# Patient Record
Sex: Male | Born: 1940 | Race: White | Hispanic: No | Marital: Married | Smoking: Former smoker
Health system: Southern US, Community
[De-identification: ages and names within clinical notes are randomized; demographics above are authoritative.]

## PROBLEM LIST (undated history)

## (undated) DIAGNOSIS — G4730 Sleep apnea, unspecified: Secondary | ICD-10-CM

## (undated) DIAGNOSIS — J8410 Pulmonary fibrosis, unspecified: Secondary | ICD-10-CM

## (undated) DIAGNOSIS — J441 Chronic obstructive pulmonary disease with (acute) exacerbation: Secondary | ICD-10-CM

## (undated) DIAGNOSIS — I6529 Occlusion and stenosis of unspecified carotid artery: Secondary | ICD-10-CM

## (undated) DIAGNOSIS — K219 Gastro-esophageal reflux disease without esophagitis: Secondary | ICD-10-CM

## (undated) DIAGNOSIS — I444 Left anterior fascicular block: Secondary | ICD-10-CM

## (undated) DIAGNOSIS — E782 Mixed hyperlipidemia: Secondary | ICD-10-CM

## (undated) DIAGNOSIS — J019 Acute sinusitis, unspecified: Secondary | ICD-10-CM

## (undated) DIAGNOSIS — G4700 Insomnia, unspecified: Secondary | ICD-10-CM

## (undated) DIAGNOSIS — T4145XA Adverse effect of unspecified anesthetic, initial encounter: Secondary | ICD-10-CM

## (undated) DIAGNOSIS — J438 Other emphysema: Secondary | ICD-10-CM

## (undated) DIAGNOSIS — M1990 Unspecified osteoarthritis, unspecified site: Secondary | ICD-10-CM

## (undated) DIAGNOSIS — T8859XA Other complications of anesthesia, initial encounter: Secondary | ICD-10-CM

## (undated) DIAGNOSIS — I639 Cerebral infarction, unspecified: Secondary | ICD-10-CM

## (undated) HISTORY — DX: Sleep apnea, unspecified: G47.30

## (undated) HISTORY — PX: EYE SURGERY: SHX253

## (undated) HISTORY — PX: JOINT REPLACEMENT: SHX530

## (undated) HISTORY — DX: Acute sinusitis, unspecified: J01.90

## (undated) HISTORY — DX: Occlusion and stenosis of unspecified carotid artery: I65.29

## (undated) HISTORY — DX: Insomnia, unspecified: G47.00

## (undated) HISTORY — DX: Mixed hyperlipidemia: E78.2

## (undated) HISTORY — DX: Cerebral infarction, unspecified: I63.9

## (undated) HISTORY — PX: OTHER SURGICAL HISTORY: SHX169

## (undated) HISTORY — PX: SPINE SURGERY: SHX786

## (undated) HISTORY — DX: Other emphysema: J43.8

## (undated) HISTORY — DX: Chronic obstructive pulmonary disease with (acute) exacerbation: J44.1

---
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
## 1998-08-26 HISTORY — PX: OTHER SURGICAL HISTORY: SHX169

## 1999-05-15 ENCOUNTER — Ambulatory Visit (HOSPITAL_COMMUNITY): Admission: RE | Admit: 1999-05-15 | Discharge: 1999-05-15 | Payer: Self-pay | Admitting: Neurosurgery

## 1999-05-21 ENCOUNTER — Encounter: Payer: Self-pay | Admitting: Neurosurgery

## 1999-05-21 ENCOUNTER — Ambulatory Visit (HOSPITAL_COMMUNITY): Admission: RE | Admit: 1999-05-21 | Discharge: 1999-05-21 | Payer: Self-pay | Admitting: Neurosurgery

## 1999-07-09 ENCOUNTER — Encounter: Payer: Self-pay | Admitting: Orthopedic Surgery

## 1999-07-09 ENCOUNTER — Encounter: Admission: RE | Admit: 1999-07-09 | Discharge: 1999-07-09 | Payer: Self-pay | Admitting: Orthopedic Surgery

## 1999-08-27 HISTORY — PX: OTHER SURGICAL HISTORY: SHX169

## 2000-02-15 ENCOUNTER — Encounter: Payer: Self-pay | Admitting: Orthopedic Surgery

## 2000-02-21 ENCOUNTER — Encounter: Payer: Self-pay | Admitting: Orthopedic Surgery

## 2000-02-21 ENCOUNTER — Inpatient Hospital Stay (HOSPITAL_COMMUNITY): Admission: RE | Admit: 2000-02-21 | Discharge: 2000-02-27 | Payer: Self-pay | Admitting: Orthopedic Surgery

## 2000-02-25 ENCOUNTER — Encounter: Payer: Self-pay | Admitting: Orthopedic Surgery

## 2003-01-27 ENCOUNTER — Encounter: Admission: RE | Admit: 2003-01-27 | Discharge: 2003-01-27 | Payer: Self-pay | Admitting: Orthopedic Surgery

## 2003-01-27 ENCOUNTER — Encounter: Payer: Self-pay | Admitting: Orthopedic Surgery

## 2003-02-07 ENCOUNTER — Encounter: Payer: Self-pay | Admitting: Radiology

## 2003-02-07 ENCOUNTER — Encounter: Admission: RE | Admit: 2003-02-07 | Discharge: 2003-02-07 | Payer: Self-pay | Admitting: Orthopedic Surgery

## 2003-02-07 ENCOUNTER — Encounter: Payer: Self-pay | Admitting: Orthopedic Surgery

## 2003-02-21 ENCOUNTER — Encounter: Admission: RE | Admit: 2003-02-21 | Discharge: 2003-02-21 | Payer: Self-pay | Admitting: Orthopedic Surgery

## 2003-02-21 ENCOUNTER — Encounter: Payer: Self-pay | Admitting: Orthopedic Surgery

## 2003-03-07 ENCOUNTER — Encounter: Admission: RE | Admit: 2003-03-07 | Discharge: 2003-03-07 | Payer: Self-pay | Admitting: Orthopedic Surgery

## 2003-03-07 ENCOUNTER — Encounter: Payer: Self-pay | Admitting: Orthopedic Surgery

## 2003-08-27 HISTORY — PX: OTHER SURGICAL HISTORY: SHX169

## 2004-02-09 ENCOUNTER — Inpatient Hospital Stay (HOSPITAL_COMMUNITY): Admission: RE | Admit: 2004-02-09 | Discharge: 2004-02-14 | Payer: Self-pay | Admitting: Orthopedic Surgery

## 2004-11-30 ENCOUNTER — Encounter: Admission: RE | Admit: 2004-11-30 | Discharge: 2004-11-30 | Payer: Self-pay | Admitting: Orthopedic Surgery

## 2004-12-13 ENCOUNTER — Encounter: Admission: RE | Admit: 2004-12-13 | Discharge: 2004-12-13 | Payer: Self-pay | Admitting: Orthopedic Surgery

## 2004-12-27 ENCOUNTER — Encounter: Admission: RE | Admit: 2004-12-27 | Discharge: 2004-12-27 | Payer: Self-pay | Admitting: Orthopedic Surgery

## 2005-01-09 ENCOUNTER — Encounter: Admission: RE | Admit: 2005-01-09 | Discharge: 2005-01-09 | Payer: Self-pay | Admitting: Orthopedic Surgery

## 2006-08-07 ENCOUNTER — Encounter (INDEPENDENT_AMBULATORY_CARE_PROVIDER_SITE_OTHER): Payer: Self-pay | Admitting: Specialist

## 2006-08-07 ENCOUNTER — Inpatient Hospital Stay (HOSPITAL_COMMUNITY): Admission: RE | Admit: 2006-08-07 | Discharge: 2006-08-08 | Payer: Self-pay | Admitting: Orthopedic Surgery

## 2007-09-22 ENCOUNTER — Ambulatory Visit: Payer: Self-pay | Admitting: Cardiology

## 2007-09-22 LAB — CONVERTED CEMR LAB
GFR calc Af Amer: 78 mL/min
GFR calc non Af Amer: 64 mL/min
Potassium: 4.1 meq/L (ref 3.5–5.1)
Sodium: 139 meq/L (ref 135–145)

## 2007-10-02 ENCOUNTER — Ambulatory Visit: Payer: Self-pay

## 2007-10-02 ENCOUNTER — Ambulatory Visit: Payer: Self-pay | Admitting: Internal Medicine

## 2007-10-20 ENCOUNTER — Ambulatory Visit: Payer: Self-pay | Admitting: Cardiology

## 2007-10-27 ENCOUNTER — Ambulatory Visit: Payer: Self-pay | Admitting: Cardiology

## 2007-10-27 LAB — CONVERTED CEMR LAB
Albumin: 3.6 g/dL (ref 3.5–5.2)
Alkaline Phosphatase: 64 units/L (ref 39–117)
LDL Cholesterol: 108 mg/dL — ABNORMAL HIGH (ref 0–99)
Total Bilirubin: 1 mg/dL (ref 0.3–1.2)
Total CHOL/HDL Ratio: 3
Triglycerides: 44 mg/dL (ref 0–149)

## 2007-11-27 ENCOUNTER — Encounter: Admission: RE | Admit: 2007-11-27 | Discharge: 2007-11-27 | Payer: Self-pay | Admitting: Orthopedic Surgery

## 2007-12-01 ENCOUNTER — Ambulatory Visit: Payer: Self-pay | Admitting: Pulmonary Disease

## 2007-12-01 DIAGNOSIS — R0602 Shortness of breath: Secondary | ICD-10-CM

## 2007-12-01 DIAGNOSIS — R05 Cough: Secondary | ICD-10-CM

## 2007-12-03 ENCOUNTER — Encounter: Payer: Self-pay | Admitting: Pulmonary Disease

## 2007-12-15 ENCOUNTER — Ambulatory Visit: Payer: Self-pay | Admitting: Pulmonary Disease

## 2007-12-17 ENCOUNTER — Telehealth (INDEPENDENT_AMBULATORY_CARE_PROVIDER_SITE_OTHER): Payer: Self-pay | Admitting: *Deleted

## 2007-12-21 ENCOUNTER — Ambulatory Visit: Payer: Self-pay | Admitting: Pulmonary Disease

## 2007-12-21 DIAGNOSIS — J449 Chronic obstructive pulmonary disease, unspecified: Secondary | ICD-10-CM | POA: Insufficient documentation

## 2007-12-25 ENCOUNTER — Telehealth (INDEPENDENT_AMBULATORY_CARE_PROVIDER_SITE_OTHER): Payer: Self-pay | Admitting: *Deleted

## 2008-01-04 ENCOUNTER — Telehealth (INDEPENDENT_AMBULATORY_CARE_PROVIDER_SITE_OTHER): Payer: Self-pay | Admitting: *Deleted

## 2008-01-08 ENCOUNTER — Ambulatory Visit: Payer: Self-pay | Admitting: Pulmonary Disease

## 2008-01-19 ENCOUNTER — Telehealth: Payer: Self-pay | Admitting: Pulmonary Disease

## 2008-04-18 ENCOUNTER — Ambulatory Visit: Payer: Self-pay | Admitting: Cardiology

## 2008-04-18 LAB — CONVERTED CEMR LAB
BUN: 8 mg/dL (ref 6–23)
Chloride: 108 meq/L (ref 96–112)
GFR calc Af Amer: 78 mL/min
GFR calc non Af Amer: 64 mL/min
Glucose, Bld: 94 mg/dL (ref 70–99)
Potassium: 3.6 meq/L (ref 3.5–5.1)
Sodium: 141 meq/L (ref 135–145)

## 2008-04-20 ENCOUNTER — Ambulatory Visit: Payer: Self-pay | Admitting: Cardiovascular Disease

## 2008-06-27 ENCOUNTER — Ambulatory Visit: Payer: Self-pay | Admitting: Pulmonary Disease

## 2008-07-11 ENCOUNTER — Telehealth (INDEPENDENT_AMBULATORY_CARE_PROVIDER_SITE_OTHER): Payer: Self-pay | Admitting: *Deleted

## 2008-10-27 ENCOUNTER — Encounter: Payer: Self-pay | Admitting: Cardiology

## 2008-10-27 ENCOUNTER — Ambulatory Visit: Payer: Self-pay | Admitting: Cardiology

## 2008-11-01 ENCOUNTER — Ambulatory Visit: Payer: Self-pay | Admitting: Cardiology

## 2008-11-01 LAB — CONVERTED CEMR LAB
ALT: 19 units/L (ref 0–53)
CO2: 30 meq/L (ref 19–32)
Calcium: 8.9 mg/dL (ref 8.4–10.5)
Creatinine, Ser: 1 mg/dL (ref 0.4–1.5)
GFR calc Af Amer: 96 mL/min
Glucose, Bld: 99 mg/dL (ref 70–99)
HDL: 52.5 mg/dL (ref >39.0)
Total CHOL/HDL Ratio: 3.2
Total Protein: 6.6 g/dL (ref 6.0–8.3)
Triglycerides: 59 mg/dL (ref 0–149)
VLDL: 12 mg/dL (ref 0–40)

## 2008-12-28 ENCOUNTER — Ambulatory Visit: Payer: Self-pay | Admitting: Pulmonary Disease

## 2009-01-27 ENCOUNTER — Encounter: Admission: RE | Admit: 2009-01-27 | Discharge: 2009-01-27 | Payer: Self-pay | Admitting: Orthopedic Surgery

## 2009-02-06 ENCOUNTER — Encounter: Payer: Self-pay | Admitting: Pulmonary Disease

## 2009-02-16 ENCOUNTER — Encounter: Admission: RE | Admit: 2009-02-16 | Discharge: 2009-02-16 | Payer: Self-pay | Admitting: Orthopedic Surgery

## 2009-03-30 ENCOUNTER — Ambulatory Visit: Payer: Self-pay | Admitting: Pulmonary Disease

## 2009-04-12 ENCOUNTER — Ambulatory Visit: Payer: Self-pay | Admitting: Pulmonary Disease

## 2009-04-12 DIAGNOSIS — J441 Chronic obstructive pulmonary disease with (acute) exacerbation: Secondary | ICD-10-CM | POA: Insufficient documentation

## 2009-04-12 DIAGNOSIS — J019 Acute sinusitis, unspecified: Secondary | ICD-10-CM | POA: Insufficient documentation

## 2009-07-24 ENCOUNTER — Ambulatory Visit: Payer: Self-pay | Admitting: Urology

## 2009-07-26 ENCOUNTER — Ambulatory Visit: Payer: Self-pay | Admitting: Urology

## 2009-10-03 ENCOUNTER — Encounter: Payer: Self-pay | Admitting: Cardiology

## 2009-10-03 ENCOUNTER — Ambulatory Visit: Payer: Self-pay

## 2009-10-03 DIAGNOSIS — I739 Peripheral vascular disease, unspecified: Secondary | ICD-10-CM

## 2009-10-24 ENCOUNTER — Ambulatory Visit: Payer: Self-pay | Admitting: Cardiology

## 2009-10-24 DIAGNOSIS — E782 Mixed hyperlipidemia: Secondary | ICD-10-CM | POA: Insufficient documentation

## 2009-11-07 ENCOUNTER — Ambulatory Visit: Payer: Self-pay | Admitting: Pulmonary Disease

## 2010-04-27 ENCOUNTER — Encounter: Admission: RE | Admit: 2010-04-27 | Discharge: 2010-04-27 | Payer: Self-pay | Admitting: Orthopedic Surgery

## 2010-05-04 ENCOUNTER — Encounter: Admission: RE | Admit: 2010-05-04 | Discharge: 2010-05-04 | Payer: Self-pay | Admitting: Orthopedic Surgery

## 2010-05-18 ENCOUNTER — Encounter: Admission: RE | Admit: 2010-05-18 | Discharge: 2010-05-18 | Payer: Self-pay | Admitting: Orthopedic Surgery

## 2010-06-15 ENCOUNTER — Encounter: Admission: RE | Admit: 2010-06-15 | Discharge: 2010-06-15 | Payer: Self-pay | Admitting: Orthopedic Surgery

## 2010-07-12 ENCOUNTER — Encounter: Admission: RE | Admit: 2010-07-12 | Discharge: 2010-07-12 | Payer: Self-pay | Admitting: Orthopedic Surgery

## 2010-07-16 ENCOUNTER — Encounter: Admission: RE | Admit: 2010-07-16 | Discharge: 2010-07-16 | Payer: Self-pay | Admitting: Neurosurgery

## 2010-07-26 ENCOUNTER — Ambulatory Visit: Payer: Self-pay | Admitting: Pulmonary Disease

## 2010-08-09 ENCOUNTER — Ambulatory Visit: Payer: Self-pay | Admitting: Pulmonary Disease

## 2010-08-24 ENCOUNTER — Inpatient Hospital Stay (HOSPITAL_COMMUNITY)
Admission: RE | Admit: 2010-08-24 | Discharge: 2010-08-25 | Payer: Self-pay | Source: Home / Self Care | Attending: Neurosurgery | Admitting: Neurosurgery

## 2010-09-20 ENCOUNTER — Other Ambulatory Visit: Payer: Self-pay | Admitting: Neurosurgery

## 2010-09-20 DIAGNOSIS — Z09 Encounter for follow-up examination after completed treatment for conditions other than malignant neoplasm: Secondary | ICD-10-CM

## 2010-09-27 NOTE — Assessment & Plan Note (Signed)
Summary: acute sick visit for cough   Visit Type:  Sick visit Primary Provider/Referring Provider:  [name]  CC:  Pt c/o dry hacking non-productive cough x 1 month and face and head pressure starting Sunday.  History of Present Illness: The pt is a 70y/o male who comes in today for an acute sick visit related to cough.  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  He had been doing very well until approx 3 weeks ago when he began to develop a dry hacking cough, as well as head congestion and pressure.  He had not had any mucus from his nares, and no purulence noted.  He denies increased sob or chest symptoms.  He has had no f/c/s.  Current Medications (verified): 1)  Mirapex 0.5 Mg  Tabs (Pramipexole Dihydrochloride) .... Take 1 Tab By Mouth At Bedtime 2)  Doxazosin Mesylate 2 Mg  Tabs (Doxazosin Mesylate) .... Take 1 Tablet By Mouth Once A Day 3)  Clonazepam 1 Mg  Tabs (Clonazepam) .... Take 1 Tab By Mouth At Bedtime 4)  Pantoprazole Sodium 40 Mg  Tbec (Pantoprazole Sodium) .... Take 1 Tablet By Mouth Am and Pm 5)  Low-Dose Aspirin 81 Mg  Tabs (Aspirin) .... Take 1 Tablet By Mouth Once A Day 6)  Multivitamins   Tabs (Multiple Vitamin) .... Take 1 Tablet By Mouth Once A Day 7)  Ventolin Hfa 108 (90 Base) Mcg/act Aers (Albuterol Sulfate) .... Inhale 2 Puffs Every 6 Hours As Needed Rescue Only 8)  Spiriva Handihaler 18 Mcg  Caps (Tiotropium Bromide Monohydrate) .... Inhale Contents of 1 Capsule Once A Day 9)  Symbicort 160-4.5 Mcg/act  Aero (Budesonide-Formoterol Fumarate) .... Inhale 2 Puffs Two Times A Day 10)  Sertraline Hcl 25 Mg Tabs (Sertraline Hcl) .... Once Daily 11)  Chlorpheniramine Maleate 4 Mg Tabs (Chlorpheniramine Maleate) .... 1 Tab As Needed 12)  Nasal Saline Rinses .... Two Times A Day 13)  Vitamin D 1000 Unit Tabs (Cholecalciferol) .... 1 Tab Once Daily 14)  Coq10 30 Mg Caps (Coenzyme Q10) .... 1 Cap Once Daily 15)   Fish Oil 1000 Mg Caps (Omega-3 Fatty Acids) .... 1 Cap Once Daily 16)  Cinnamon Powder .... 1/4 Tsp Once Daily 17)  Cats Claw 500 Mg Caps (Cats Claw (Uncaria Tomentosa)) .... 2 Cap Once Daily 18)  African Mango 150 Mg .... 2 Caps Once Daily 19)  Tumeric Extract 300mg .... 2 Cap Once Daily 20)  Mucinex Maximum Strength 1200 Mg Xr12h-Tab (Guaifenesin) .... Take 1 Tablet By Mouth Two Times A Day As Needed 21)  Dhea 50 Mg Tabs (Prasterone (Dhea)) .... Take 1 Tablet By Mouth Once A Day 22)  Vitamin C 500 Mg Tabs (Ascorbic Acid) .... Take 1 Tablet By Mouth Two Times A Day  Allergies (verified): 1)  ! Sulfa  Review of Systems       The patient complains of non-productive cough, headaches, and nasal congestion/difficulty breathing through nose.  The patient denies shortness of breath with activity, shortness of breath at rest, productive cough, coughing up blood, chest pain, irregular heartbeats, acid heartburn, indigestion, loss of appetite, weight change, abdominal pain, difficulty swallowing, sore throat, tooth/dental problems, sneezing, itching, ear ache, anxiety, depression, hand/feet swelling, joint stiffness or pain, rash, change in color of mucus, and fever.    Vital Signs:  Patient profile:   70 year old male Height:      70  inches Weight:  199 pounds O2 Sat:      96 % on Room air Temp:     97.9 degrees F oral Pulse rate:   84 / minute BP sitting:   114 / 70  (left arm) Cuff size:   regular  Vitals Entered By: Zackery Barefoot CMA (November 07, 2009 9:26 AM)  O2 Flow:  Room air CC: Pt c/o dry hacking non-productive cough x 1 month, face and head pressure starting Sunday Comments Medications reviewed with patient Verified contact number and pharmacy with patient Zackery Barefoot CMA  November 07, 2009 9:27 AM     Physical Exam  General:  wd male in nad Nose:  narrowing bilat with mild crusting but no purulence Mouth:  clear, no thrush Lungs:  clear to auscultation Heart:   rrr, no mrg Extremities:  no edema or cyanosis Neurologic:  alert and oriented, moves all 4.   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  It is unclear if his current increase in cough is due to this, or if he may have developing sinusitis.  He has had issues with this before, but currently with pressure but no purulence.  Will treat this conservatively with nasal hygiene and behavioral therapy for his cough, but will have low threshold to check xray of sinuses if symptoms continue.  Problem # 2:  EMPHYSEMA (ICD-492.8)  stable on current meds.  Medications Added to Medication List This Visit: 1)  Mucinex Maximum Strength 1200 Mg Xr12h-tab (Guaifenesin) .... Take 1 tablet by mouth two times a day as needed 2)  Dhea 50 Mg Tabs (Prasterone (dhea)) .... Take 1 tablet by mouth once a day 3)  Vitamin C 500 Mg Tabs (Ascorbic acid) .... Take 1 tablet by mouth two times a day  Other Orders: Est. Patient Level IV (16109)  Patient Instructions: 1)  try nasonex 2 sprays each nostril each am, continue saline rinses 2)  minimize voice use (no long conversations, no singing/yelling) 3)  no throat clearing, hard candy during the day to bathe back of throat. 4)  If cough is not improving, please let me know and we can consider an xray of your sinuses. 5)  If doing well, can change nasonex to as needed. 6)  followup with me yearly.   Immunization History:  Influenza Immunization History:    Influenza:  historical (06/26/2009)

## 2010-09-27 NOTE — Assessment & Plan Note (Signed)
Summary: acute sick visit for copd, cyclical coughing.   Visit Type:  Follow-up Primary Provider/Referring Provider:  [name]  CC:  follow up. pt c/o cough with occas white- yellow phlem. Pt states he is having a hard time breathing and dry mouth. pt states when he stands up he starts coughing and gets dizzy and his chest hurts. pt states he quit smoking 1979.Marland Kitchen  History of Present Illness: the pt comes in today for an acute sick visit.  He has known mild emphysema, and also classic cyclical coughing that flares from time to time.  He comes in today where he has had cough with discolored mucus, increased sob, and chest congestion.  He has also had nasal congestion with postnasal drip.  Since having this, he has started with his cyclical cough mechanism, and at times coughs so hard he gets dizzy.    Current Medications (verified): 1)  Mirapex 0.5 Mg  Tabs (Pramipexole Dihydrochloride) .... Take 1 Tab By Mouth At Bedtime 2)  Doxazosin Mesylate 2 Mg  Tabs (Doxazosin Mesylate) .... Take 1 Tablet By Mouth Once A Day 3)  Clonazepam 1 Mg  Tabs (Clonazepam) .... Take 1 Tab By Mouth At Bedtime 4)  Pantoprazole Sodium 40 Mg  Tbec (Pantoprazole Sodium) .... Take 1 Tablet By Mouth Am and Pm 5)  Multivitamins   Tabs (Multiple Vitamin) .... Take 1 Tablet By Mouth Once A Day 6)  Ventolin Hfa 108 (90 Base) Mcg/act Aers (Albuterol Sulfate) .... Inhale 2 Puffs Every 6 Hours As Needed Rescue Only 7)  Spiriva Handihaler 18 Mcg  Caps (Tiotropium Bromide Monohydrate) .... Inhale Contents of 1 Capsule Once A Day 8)  Symbicort 160-4.5 Mcg/act  Aero (Budesonide-Formoterol Fumarate) .... Inhale 2 Puffs Two Times A Day 9)  Sertraline Hcl 25 Mg Tabs (Sertraline Hcl) .... Once Daily 10)  Chlorpheniramine Maleate 4 Mg Tabs (Chlorpheniramine Maleate) .Marland Kitchen.. 1 Tab As Needed 11)  Nasal Saline Rinses .... Two Times A Day 12)  Vitamin D 1000 Unit Tabs (Cholecalciferol) .Marland Kitchen.. 1 Tab Once Daily 13)  Fish Oil 1000 Mg Caps  (Omega-3 Fatty Acids) .Marland Kitchen.. 1 Cap Once Daily 14)  Mucinex Maximum Strength 1200 Mg Xr12h-Tab (Guaifenesin) .... Take 1 Tablet By Mouth Two Times A Day As Needed 15)  Vitamin C 500 Mg Tabs (Ascorbic Acid) .... Take 1 Tablet By Mouth Two Times A Day 16)  Delsym 30 Mg/66ml Lqcr (Dextromethorphan Polistirex) .... Per Bottle  Allergies (verified): 1)  ! Sulfa  Past History:  Past medical, surgical, family and social histories (including risk factors) reviewed, and no changes noted (except as noted below).  Past Medical History: Reviewed history from 10/19/2009 and no changes required. 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 s/p spine surgery in 2000 s/p appendectomy Had a right ankle reconstruction in 1986.  Had a ruptured disk in 2000.  Left knee replacement in 2001.  Right knee replacement 2005.      Family History: Reviewed history from 12/01/2007 and no changes required. Mother - emphysema Maternal mother and father - heart disease  Social History: Reviewed history from 12/01/2007 and no changes required. 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  The patient denies non-productive cough, coughing up blood, irregular heartbeats, acid heartburn, indigestion, weight change, abdominal pain, difficulty swallowing, sore throat, tooth/dental problems, sneezing, itching, ear ache, depression, hand/feet swelling, joint stiffness or pain, rash, and fever.     Vital Signs:  Patient profile:   70 year old male Height:      70 inches Weight:      194 pounds BMI:     27.94 O2 Sat:      96 % on Room air Temp:     97.5 degrees F oral Pulse rate:   99 / minute BP sitting:   98 / 70  (left arm) Cuff size:   regular  Vitals Entered By: Carver Fila (July 26, 2010 9:42 AM)  O2 Flow:  Room air CC: follow up. pt c/o cough with occas white- yellow phlem. Pt states he is having a hard time breathing, dry mouth. pt states when he stands up he starts coughing and gets dizzy and his chest hurts. pt states he quit smoking 1979. Comments meds and allergies updated Phone number updated Carver Fila  July 26, 2010 9:43 AM    Physical Exam  General:  ow male in nad Nose:  no purulence or drainage noted. Mouth:  clear, no exudates Lungs:  mild decrease in bs, but no wheezing or rhonchi Heart:  rrr, no mrg Extremities:  no edema or cyanosis Neurologic:  alert and oriented, moves all 4.   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  His history is suggestive of a mild copd exacerbation, and will treat with a short course of prednisone to see if he improves.  This will also help his upper airway irritation as well.  Problem # 2:  COUGH (ICD-786.2) the pt has known issues with cyclical coughing.  He was doing well until recent episode of sinobronchitis, and his cough has escalated again.  At this point, would like to treat him with the modified cyclical cough protocol  Medications Added to Medication List This Visit: 1)  Delsym 30 Mg/76ml Lqcr (Dextromethorphan polistirex) .... Per bottle 2)  Tussicaps 10-8 Mg Xr12h-cap (Hydrocod polst-chlorphen polst) .... One by mouth every 12 hours if needed for cough 3)  Prednisone 10 Mg Tabs (Prednisone) .... Take 4 each day for 2 days,  then 3 each day for 2 days, then 2 each day for 2 days, then 1 each day for 2 days, then stop  Other Orders: Est. Patient Level IV (04540)  Patient Instructions: 1)  will treat with a course of prednisone to help with airway inflammation 2)  will treat with tussicaps for cough suppression to help break the cycle. 3)  work on voice rest, no throat clearing, hard candy to bathe the back of your throat. 4)  no change in pulmonary meds. 5)  If you get back to your usual baseline, followup with me in one year.  If not, you need to let us know.  Prescriptions: PREDNISONE 10 MG  TABS (PREDNISONE) take 4 each day for 2 days, then 3 each day for 2 days, then 2 each day for 2 days, then 1 each day for 2 days, then stop  #20 x 0   Entered and Authorized by:  Barbaraann Share MD   Signed by:   Barbaraann Share MD on 07/26/2010   Method used:   Print then Give to Patient   RxID:   [id number] TUSSICAPS 10-8 MG XR12H-CAP (HYDROCOD POLST-CHLORPHEN POLST) one by mouth every 12 hours if needed for cough  #12 x 0   Entered and Authorized by:   Barbaraann Share MD   Signed by:   Barbaraann Share MD on 07/26/2010   Method used:   Print then Give to Patient   RxID:   [id number]    Immunization History:  Influenza Immunization History:    Influenza:  historical (04/26/2010)  Pneumovax Immunization History:    Pneumovax:  historical (05/26/2008)

## 2010-09-27 NOTE — Assessment & Plan Note (Signed)
Summary: YEARLY/SL   Visit Type:  1 yr f/u Primary Provider:  [name]  CC:  sob pt has emphysema....no other cardiac complaints today.  History of Present Illness: Mr Czajka returns today for evaluation and management carotid artery disease and hyperlipidemia.  We evaluated in the past for coronary artery disease which was negative a stress nuclear study in February 2009. He does have chronic dyspnea which is related to his lung disease.  His total cholesterol last she was 169, triglycerides 59, HDL 52.5, LDL 105, total cholesterol ratio ratio 3.2. I've asked him to get blood work on his next visit with Dr. Zachery Dauer.  Carotid Dopplers were rechecked on October 03, 2009 which showed nonobstructive disease with antegrade flow in both vertebrals.  Current Medications (verified): 1)  Mirapex 0.5 Mg  Tabs (Pramipexole Dihydrochloride) .... Take 1 Tab By Mouth At Bedtime 2)  Doxazosin Mesylate 2 Mg  Tabs (Doxazosin Mesylate) .... Take 1 Tablet By Mouth Once A Day 3)  Clonazepam 1 Mg  Tabs (Clonazepam) .... Take 1 Tab By Mouth At Bedtime 4)  Pantoprazole Sodium 40 Mg  Tbec (Pantoprazole Sodium) .... Take 1 Tablet By Mouth Am and Pm 5)  Low-Dose Aspirin 81 Mg  Tabs (Aspirin) .... Take 1 Tablet By Mouth Once A Day 6)  Multivitamins   Tabs (Multiple Vitamin) .... Take 1 Tablet By Mouth Once A Day 7)  Ventolin Hfa 108 (90 Base) Mcg/act Aers (Albuterol Sulfate) .... Inhale 2 Puffs Every 6 Hours As Needed Rescue Only 8)  Spiriva Handihaler 18 Mcg  Caps (Tiotropium Bromide Monohydrate) .... Inhale Contents of 1 Capsule Once A Day 9)  Symbicort 160-4.5 Mcg/act  Aero (Budesonide-Formoterol Fumarate) .... Inhale 2 Puffs Two Times A Day 10)  Sertraline Hcl 25 Mg Tabs (Sertraline Hcl) .... Once Daily 11)  Chlorpheniramine Maleate 4 Mg Tabs (Chlorpheniramine Maleate) .Marland Kitchen.. 1 Tab As Needed 12)  Nasal Saline Rinses .... Two Times A Day 13)  Vitamin D 1000 Unit Tabs (Cholecalciferol) .Marland Kitchen.. 1 Tab Once  Daily 14)  Coq10 30 Mg Caps (Coenzyme Q10) .Marland Kitchen.. 1 Cap Once Daily 15)  Fish Oil 1000 Mg Caps (Omega-3 Fatty Acids) .Marland Kitchen.. 1 Cap Once Daily 16)  Cinnamon Powder .... 1/4 Tsp Once Daily 17)  Cats Claw 500 Mg Caps (Cats Claw (Uncaria Tomentosa)) .... 2 Cap Once Daily 18)  African Mango 150 Mg .... 2 Caps Once Daily 19)  Tumeric Extract 300mg  .... 2 Cap Once Daily  Allergies: 1)  ! 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  Had a ruptured disk in 2000.  Left knee replacement in 2001.  Right knee replacement 2005.      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 Head:  normocephalic and atraumatic Eyes:  wears glasses Neck:  Neck supple, no JVD. No masses, thyromegaly or abnormal cervical nodes. Chest Nikkita Adeyemi:  no deformities or breast masses noted Lungs:  decreased breath sounds throughout Heart:  PMI nondisplaced, soft S1-S2, no gallop or rub. Abdomen:  Bowel  sounds positive; abdomen soft and non-tender without masses, organomegaly, or hernias noted. No hepatosplenomegaly. Msk:  Back normal, normal gait. Muscle strength and tone normal. Pulses:  pulses normal in all 4 extremities Extremities:  No clubbing or cyanosis. Neurologic:  Alert and oriented x 3. Skin:  Intact without lesions or rashes. Psych:  Normal affect.   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  Impression & Recommendations:  Problem # 1:  CAROTID ARTERY DISEASE (ICD-433.10) Assessment Unchanged I have reviewed his recent carotid Dopplers. No changes in treatment. Repeat study and 2 years. His updated medication list for this problem includes:    Low-dose Aspirin 81 Mg Tabs (Aspirin) .Marland Kitchen... Take 1 tablet by mouth once a day  His updated medication list for this problem includes:    Low-dose Aspirin 81 Mg Tabs (Aspirin) .Marland Kitchen... Take 1 tablet by mouth once a day  Problem # 2:  DYSPNEA (ICD-786.05) Assessment: Unchanged  His updated medication list for this problem includes:    Low-dose Aspirin 81 Mg Tabs (Aspirin) .Marland Kitchen... Take 1 tablet by mouth once a day  His updated medication list for this problem includes:    Low-dose Aspirin 81 Mg Tabs (Aspirin) .Marland Kitchen... 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 Zachery Dauer checked at on his next visit. No changes made in his medications. Labs from last year reviewed.  Patient Instructions: 1)  Your physician recommends that you schedule a follow-up appointment in: YEAR WITH DR Mystique Bjelland 2)  Your physician recommends that you continue on your current medications as directed. Please refer to  the Current Medication list given to you today. 3)  LABS AT PMD OFFICE BMET LIPID LIVER

## 2010-09-27 NOTE — Miscellaneous (Signed)
Summary: Orders Update  Clinical Lists Changes  Problems: Added new problem of CAROTID ARTERY DISEASE (ICD-433.10) Orders: Added new Test order of Carotid Duplex (Carotid Duplex) - Signed 

## 2010-09-27 NOTE — Assessment & Plan Note (Signed)
Summary: acute sick visit for cough and congestion   Primary Shirley Decamp/Referring Adyen Bifulco:  Jeffrey Cobb  CC:  pt c/o cough w/ yellow to green phlem, congestion, increased sob, and body aches from coughing. Pt states he quit smoking 1979. pt states he was fine 2-3 days after finishing the pred and cough syrup but then it came back. .  History of Present Illness: the pt comes in today for an acute sick visit.  He has known mild emphysema, but more importantly a h/o UA dysfunction with cyclical coughing from time to time.  He comes in today with a 2-3 day h/o worsening cough, chest congestion, and purulent appearing mucus.  He had a recent copd flare earlier in the month due to sinobronchitis, and was treated with abx and later a course of steroids.  He did well on the meds, but his cough has come back similar in the past when he had cyclical coughing.    Current Medications (verified): 1)  Mirapex 0.5 Mg  Tabs (Pramipexole Dihydrochloride) .... Take 1 Tab By Mouth At Bedtime 2)  Doxazosin Mesylate 2 Mg  Tabs (Doxazosin Mesylate) .... Take 1 Tablet By Mouth Once A Day 3)  Clonazepam 1 Mg  Tabs (Clonazepam) .... Take 1 Tab By Mouth At Bedtime 4)  Pantoprazole Sodium 40 Mg  Tbec (Pantoprazole Sodium) .... Take 1 Tablet By Mouth Am and Pm 5)  Multivitamins   Tabs (Multiple Vitamin) .... Take 1 Tablet By Mouth Once A Day 6)  Ventolin Hfa 108 (90 Base) Mcg/act Aers (Albuterol Sulfate) .... Inhale 2 Puffs Every 6 Hours As Needed Rescue Only 7)  Spiriva Handihaler 18 Mcg  Caps (Tiotropium Bromide Monohydrate) .... Inhale Contents of 1 Capsule Once A Day 8)  Symbicort 160-4.5 Mcg/act  Aero (Budesonide-Formoterol Fumarate) .... Inhale 2 Puffs Two Times A Day 9)  Sertraline Hcl 25 Mg Tabs (Sertraline Hcl) .... Once Daily 10)  Chlorpheniramine Maleate 4 Mg Tabs (Chlorpheniramine Maleate) .Marland Kitchen.. 1 Tab As Needed 11)  Nasal Saline Rinses .... Two Times A Day  Allergies (verified): 1)  ! Sulfa  Past  History:  Past medical, surgical, family and social histories (including risk factors) reviewed, and no changes noted (except as noted below).  Past Medical History: Reviewed history from 10/19/2009 and no changes required. 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 s/p spine surgery in 2000 s/p appendectomy Had a right ankle reconstruction in 1986.  Had a ruptured disk in 2000.  Left knee replacement in 2001.  Right knee replacement 2005.      Family History: Reviewed history from 12/01/2007 and no changes required. Mother - emphysema Maternal mother and father - heart disease  Social History: Reviewed history from 12/01/2007 and no changes required. 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  The patient denies shortness of breath at rest, non-productive cough, coughing up blood, chest pain, irregular heartbeats, acid heartburn, indigestion, weight change, abdominal pain, difficulty swallowing, tooth/dental problems, headaches, sneezing, itching, ear ache, anxiety, depression, hand/feet swelling, rash, and fever.    Vital Signs:  Patient profile:   69 year  old male Height:      70 inches Weight:      196 pounds BMI:     28.22 O2 Sat:      96 % on Room air Temp:     98.2 degrees F oral Pulse rate:   90 / minute BP sitting:   102 / 72  (left arm) Cuff size:   regular  Vitals Entered By: Carver Fila (August 09, 2010 9:49 AM)  O2 Flow:  Room air CC: pt c/o cough w/  yellow to green phlem, congestion, increased sob, body aches from coughing. Pt states he quit smoking 1979. pt states he was fine 2-3 days after finishing the pred and cough syrup but then it came back.  Comments meds and allergies updated Phone number updated Carver Fila  August 09, 2010 9:49 AM    Physical Exam  General:  ow male in nad Nose:  mild purulence noted in nasal passages, but patent Mouth:  clear, no exudates. Lungs:  totally clear to auscultation Heart:  rrr Extremities:  no edema or cyanosis  Neurologic:  alert and oriented, moves all 4.   Impression & Recommendations:  Problem # 1:  COUGH (ICD-786.2) the pt is having issues with cyclical coughing again after an episode of sinobronchitis.  It is unclear whether he is infected at this point or not, but he is convinced he is.  Will need to treat with the cyclical cough protocol again, and will emperically treat with abx as well.  Will check cxr for completeness, but if he has persistent symptoms he may need ct sinuses.  Medications Added to Medication List This Visit: 1)  Cefdinir 300 Mg Caps (Cefdinir) .... 2 each day for 5 days 2)  Tussicaps 10-8 Mg Xr12h-cap (Hydrocod polst-chlorphen polst) .... One every 12hrs if needed. 3)  Tessalon Perles 100 Mg Caps (Benzonatate) .... 2 up to every 6 hrs if needed.  Other Orders: Est. Patient Level IV (01093) T-2 View CXR (71020TC)  Patient Instructions: 1)  will check a cxr today for completeness. 2)  omnicef 300mg  2 each day for 5 days 3)  cyclical cough protocol....please see instruction sheet. 4)  call if not improving  Prescriptions: TESSALON PERLES 100 MG  CAPS (BENZONATATE) 2 up to every 6 hrs if needed.  #30 x 1   Entered and Authorized by:   Barbaraann Share MD   Signed by:   Barbaraann Share MD on 08/09/2010   Method used:   Print then Give to Patient   RxID:   [id number] TUSSICAPS 10-8 MG XR12H-CAP (HYDROCOD POLST-CHLORPHEN POLST) one every 12hrs if  needed.  #12 x 0   Entered and Authorized by:   Barbaraann Share MD   Signed by:   Barbaraann Share MD on 08/09/2010   Method used:   Print then Give to Patient   RxID:   [id number] CEFDINIR 300 MG CAPS (CEFDINIR) 2 each day for 5 days  #10 x 0   Entered and Authorized by:   Barbaraann Share MD   Signed by:   Barbaraann Share MD on 08/09/2010   Method used:   Print then Give to Patient   RxID:   [id number]    Immunization History:  Influenza Immunization History:    Influenza:  historical (05/26/2010)

## 2010-10-03 ENCOUNTER — Ambulatory Visit
Admission: RE | Admit: 2010-10-03 | Discharge: 2010-10-03 | Disposition: A | Discharge status: Home / Self Care | Payer: Medicare Other | Source: Ambulatory Visit | Attending: Neurosurgery | Admitting: Neurosurgery

## 2010-10-03 DIAGNOSIS — Z09 Encounter for follow-up examination after completed treatment for conditions other than malignant neoplasm: Secondary | ICD-10-CM

## 2010-10-14 NOTE — Op Note (Signed)
NAMESHUN, PLETZ NO.:  [id number]  MEDICAL RECORD NO.:  [id number]          PATIENT TYPE:  INP  LOCATION:  3023                         FACILITY:  [facility]  PHYSICIAN:  Reinaldo Meeker, M.D. DATE OF BIRTH:  01-19-1941  DATE OF PROCEDURE:  08/24/2010 DATE OF DISCHARGE:                              OPERATIVE REPORT   PREOPERATIVE DIAGNOSIS:  Spondylolisthesis, degenerative disk disease, and stenosis L3-4, L4-5.  POSTOPERATIVE DIAGNOSIS:  Spondylolisthesis, degenerative disk disease, and stenosis L3-4, L4-5.  PROCEDURE:  Anterolateral fusion L3-4 and L4-5 via right retroperitoneal approach with PEEK interbody spacer followed by left L3-4 and L4-5 segmental instrumentation with Zimmer pedicle screw system.  SURGEON:  Reinaldo Meeker, MD  ASSISTANT:  Kathaleen Maser. Pool, MD  PROCEDURE IN DETAIL:  After induction of general anesthetic, the patient was placed in the right side up lateral decubitus position.  AP and lateral fluoroscopy was used in standard fashion to put the patient in a good position.  We then made a linear incision over the disk space at L3- 4, carried it down up to the deep fascia.  Then made a more posterior incision which allowed [place] access into the retroperitoneum.  We then turned our finger upward and then passed the first dilator through the more flank incision and safely in the retroperitoneum.  We then went down to the L3-4 disk space and did intraoperative EMG studies which showed no evidence of nerve problem, eventually sequential dilation and eventually placed the retractor.  We tested once more, saw no evidence of nerve issues, and secured the retractor with the Shim device into the interspace.  We then coagulated any residual muscle fibers and incised the disk with a 15 blade.  Using a variety of instruments, we were able to clean the disk space out.  The disk was markedly collapsed.  We initially passed a 6- x 16-mm distractor which  did give [place] some working room.  We then distracted up to the 8-mm size which we felt was a good fit.  We then irrigated once more, released the annulus on the opposite side and impacted by an 8- x 22- x 50-mm cage without difficulty.  This was followed in good position under fluoroscopy.  We then irrigated copiously, confirmed our positioning.  We irrigated once more, stopped any bleeding with bipolar coagulation, and removed the retractor without any difficulty.  We then made a linear incision of the L4-5 interspace. We carried this down to the deep fascia.  We then entered the same retroperitoneal opening that we had made for the L3-4 level, turned our finger upward and allowed access into the retroperitoneal space at L4-5. We once again docked our first dilator on the L4-5 disk and did EMG testing, showed no problem of the nerve, though only superficially.  We then did sequential dilations and still found the same superficial nerve and we got down to the disk space.  We then placed a retractor and secured to the table in standard fashion.  Once we got the retractor into a good position, we tested once more and then locked it down with the  Shim.  We then incised the disk space.  Once again using a variety of instruments, we cleaned out the disk space, distracted up with the 16- mm x 6-mm trial.  We released the edges of the opposite side once again with the Cobb periosteal elevator.  At this time, we distracted the disk space up once more to the 8-mm size and felt that it was a good choice. We then chosen an 8- x 22- x 50-mm cage once again, filled it up with Actifuse and Osteocel Plus, packed without difficulty after irrigating once more and confirming stasis.  Followed into good position.  We then irrigated the incision once more, removed the retractor, and saw that both grafts were in good position.  We then irrigated once more, closed all the incisions with multiple layers of Vicryl  on the fascia, subcutaneous, subcu tissues, and Dermabond on the skin.  We placed sterile dressing and turned the patient to the prone position.  We then prepped the back in usual standard fashion.  Used AP fluoroscopy to identify entry points into the three pedicles.  We passed Jamshidi needle from lateral to medial direction, followed in AP and lateral fluoroscopy and in good position.  We then passed guidewire through and removed the Jamshidi needle at each level.  Starting at L5, we did sequential dilation of the soft tissue and tapped with the 6.0-mm tap. We placed a 6.5- x 40-mm screw at L4-L5 and a 6.5- x 45-mm screw at L3. These were followed in good position in AP and lateral fluoroscopy.  We did connect the 3 small incisions that we made.  We passed a measuring device and chose a 55-mm rod.  We passed the rod down the towers and then we were able to secure it to the top of the screws without difficulty.  We then did a torque and counter-torque without difficulty. Final fluoroscopy in the AP and lateral direction showed good placement of the interbody spacers as well as the screws and rod.  Large amounts of irrigation were carried out in this incision.  Any bleeding controlled with unipolar coagulation.  Wound was then closed with Vicryl on the muscle, fascia, subcutaneous and subcuticular tissues, and staples on the skin.  A sterile dressing was then applied.  The patient was extubated, taken to recovery room in stable condition.          ______________________________ Reinaldo Meeker, M.D.     ROK/MEDQ  D:  08/24/2010  T:  08/25/2010  Job:  [id number]  Electronically Signed by Aliene Beams M.D. on 10/14/2010 10:47:03 PM

## 2010-10-30 ENCOUNTER — Institutional Professional Consult (permissible substitution) (INDEPENDENT_AMBULATORY_CARE_PROVIDER_SITE_OTHER): Payer: Medicare Other | Admitting: Pulmonary Disease

## 2010-10-30 ENCOUNTER — Encounter: Payer: Self-pay | Admitting: Pulmonary Disease

## 2010-10-30 DIAGNOSIS — G4700 Insomnia, unspecified: Secondary | ICD-10-CM

## 2010-11-05 LAB — BASIC METABOLIC PANEL
Calcium: 9.4 mg/dL (ref 8.4–10.5)
GFR calc Af Amer: >60 mL/min (ref >60)
GFR calc non Af Amer: >60 mL/min (ref >60)
Potassium: 3.8 mEq/L (ref 3.5–5.1)
Sodium: 138 mEq/L (ref 135–145)

## 2010-11-05 LAB — CBC
MCH: 30.3 pg (ref 26.0–34.0)
MCHC: 33.1 g/dL (ref 30.0–36.0)
RDW: 13.7 % (ref 11.5–15.5)

## 2010-11-05 LAB — SURGICAL PCR SCREEN
MRSA, PCR: NEGATIVE
Staphylococcus aureus: POSITIVE — AB

## 2010-11-08 DIAGNOSIS — G4700 Insomnia, unspecified: Secondary | ICD-10-CM | POA: Insufficient documentation

## 2010-11-13 NOTE — Assessment & Plan Note (Signed)
Summary: consult for chronic insomnia   Copy to:  [name] Primary Provider/Referring Provider:  [name]  CC:  Sleep Consult.  Pt c/o difficulty falling and staying asleep since 2001. Marland Kitchen  History of Present Illness: The pt is a 70y/o male who I have been asked to see for insomnia.  He has had difficulty sleeping since 2001 after having knee surgery, and has been an ongoing problem since.  He has been tried on various meds over the years, none of which helped him very much.  He has had a sleep study in 2008 which showed no sleep apnea or movement disorder.  He goes to bed around 11pm, and denies watching tv or reading while in bed.  He may go to sleep quickly with subsequent awakenings, or may not be able to initiate sleep.  He typically will leave the bedroom after , and will read or watch tv in family room.  He tells me 95% of time he will never get to sleep for the rest of the night, and if he does it is extremely fragmented with awakenings.  He does get on the computer some during the middle of the night.  The pt feels anxiety is a big issue for him, and states his "mind is racing" all the time when trying to sleep.  He typically will drink 1-2 cups of coffee around 7am, one caffeinated soda during the day, and sometimes tea with dinner.  He denies formal napping during the day, but sometimes will fall asleep in chair in the afternoon.    Current Medications (verified): 1)  Mirapex 0.5 Mg  Tabs (Pramipexole Dihydrochloride) .... Take 1 Tab By Mouth At Bedtime 2)  Doxazosin Mesylate 2 Mg  Tabs (Doxazosin Mesylate) .... Take 1 Tablet By Mouth Once A Day 3)  Clonazepam 1 Mg  Tabs (Clonazepam) .... Take 1 Tab By Mouth At Bedtime 4)  Pantoprazole Sodium 40 Mg  Tbec (Pantoprazole Sodium) .... Take 1 Tablet By Mouth Am and Pm 5)  Ventolin Hfa 108 (90 Base) Mcg/act Aers (Albuterol Sulfate) .... Inhale 2 Puffs Every 6 Hours As Needed Rescue Only 6)  Spiriva Handihaler 18 Mcg  Caps  (Tiotropium Bromide Monohydrate) .... Inhale Contents of 1 Capsule Once A Day 7)  Symbicort 160-4.5 Mcg/act  Aero (Budesonide-Formoterol Fumarate) .... Inhale 2 Puffs Two Times A Day 8)  Sertraline Hcl 25 Mg Tabs (Sertraline Hcl) .... Once Daily 9)  Chlorpheniramine Maleate 4 Mg Tabs (Chlorpheniramine Maleate) .Marland Kitchen.. 1 Tab As Needed 10)  Nasal Saline Rinses .... Two Times A Day 11)  Tessalon Perles 100 Mg  Caps (Benzonatate) .... 2 Up To Every 6 Hrs If Needed.  Allergies (verified): 1)  ! Sulfa  Past History:  Past Medical History: CAROTID ARTERY DISEASE (ICD-433.10) EMPHYSEMA     Past Surgical History: s/p spine surgery in 2000 and 2011 s/p appendectomy  1956 Had a right ankle reconstruction in 1986.  Had a ruptured disk in 2000.  Left knee replacement in 2001.  Right knee replacement 2005.      Family History: Reviewed history from 12/01/2007 and no changes required. Mother - emphysema Maternal mother and father - heart disease allergies: father 2 sisters, 1 brother, son daughter  Social History: Reviewed history from 12/01/2007 and no changes required. 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  quit 1979.  Review of Systems       The patient complains of  non-productive cough, loss of appetite, weight change, and anxiety.  The patient denies shortness of breath with activity, shortness of breath at rest, productive cough, coughing up blood, chest pain, irregular heartbeats, acid heartburn, indigestion, abdominal pain, difficulty swallowing, sore throat, tooth/dental problems, headaches, nasal congestion/difficulty breathing through nose, sneezing, itching, ear ache, depression, hand/feet swelling, joint stiffness or pain, rash, change in color of mucus, and fever.    Vital Signs:  Patient profile:   70 year old male Height:      70 inches Weight:      199.25 pounds BMI:     28.69 O2 Sat:      96 % on Room  air Temp:     98.2 degrees F oral Pulse rate:   93 / minute BP sitting:   104 / 76  (left arm) Cuff size:   regular  Vitals Entered By: Arman Filter LPN (October 29, 5364 10:29 AM)  O2 Flow:  Room air CC: Sleep Consult.  Pt c/o difficulty falling and staying asleep since 2001.  Comments Medications reviewed with patient Arman Filter LPN  October 29, 4401 10:29 AM    Physical Exam  General:  wd male in nad  Eyes:  PERRLA and EOMI.   Nose:  patent without discharge, no purulence noted.  Mouth:  clear, no exudates or lesions seen  Neck:  no jvd, tmg, LN Lungs:  decreased bs, but no wheezing or rhonchi  Heart:  rrr, no mrg  Abdomen:  soft and nontender, bs+ Extremities:  no edema or cyanosis  pulses intact distally  Neurologic:  alert, oriented, moves all 4    Impression & Recommendations:  Problem # 1:  PERSISTENT DISORDER INITIATING/MAINTAINING SLEEP (ICD-307.42)  the pt is describing classic psychophysiologic insomnia, but I also think he may have a large component of sleep state misperception given how little sleep he feels he is getting.  This is a very difficult problem that is deep seated, and typically responds very poorly to medications.  The best treatment is behavioral, and he is already doing some of the things that I would recommend as part of "stimulus control".  I have reviewed sleep restriction therapy with him that he can try, but I ultimately think he will need cognitive behavioral therapy with a psychologist or psychiatrist in order for this to improve (he tells me he is already seeing a behavioral therapist).  I am willing to give him a short term treatment with trazodone as a trial, but again, behavioral therapy is the mainstay of treatment.  I have also reviewed with him again what constitutes good sleep hygiene, but overall he is not doing too bad.  Medications Added to Medication List This Visit: 1)  Trazodone Hcl 50 Mg Tabs (Trazodone hcl) .... At  bedtime  Other Orders: Consultation Level V [phone])  Patient Instructions: 1)  stop klonopine, and try trazodone 50mg  2 each night near bedtime 2)  can try sleep restriction therapy as we discussed 3)  no napping during the day under any circumstance 4)  need to get with a behavioral therapist who will do cognitive behavioral therapy.Marland KitchenMarland KitchenDr Zachery Dauer may have an alternative if you are not happy with your current therapist 5)  please call me in 3 weeks with how things are going.   Prescriptions: TRAZODONE HCL 50 MG  TABS (TRAZODONE HCL) At bedtime  #60 x 0   Entered and Authorized by:   Barbaraann Share MD   Signed by:  Barbaraann Share MD on 10/30/2010   Method used:   Print then Give to Patient   RxID:   [id number]

## 2010-11-14 ENCOUNTER — Other Ambulatory Visit: Payer: Self-pay | Admitting: Neurosurgery

## 2010-11-14 ENCOUNTER — Ambulatory Visit
Admission: RE | Admit: 2010-11-14 | Discharge: 2010-11-14 | Disposition: A | Discharge status: Home / Self Care | Payer: Medicare Other | Source: Ambulatory Visit | Attending: Neurosurgery | Admitting: Neurosurgery

## 2010-11-14 DIAGNOSIS — M48061 Spinal stenosis, lumbar region without neurogenic claudication: Secondary | ICD-10-CM

## 2010-11-14 DIAGNOSIS — M533 Sacrococcygeal disorders, not elsewhere classified: Secondary | ICD-10-CM

## 2010-11-14 DIAGNOSIS — M5137 Other intervertebral disc degeneration, lumbosacral region: Secondary | ICD-10-CM

## 2010-11-15 ENCOUNTER — Telehealth: Payer: Self-pay | Admitting: Pulmonary Disease

## 2010-11-15 MED ORDER — HYDROCOD POLST-CHLORPHEN POLST 10-8 MG/5ML PO LQCR: 5.0000 mL | Freq: Two times a day (BID) | ORAL | Status: DC

## 2010-11-15 NOTE — Telephone Encounter (Signed)
Pt was last seen by [facility] on 10/30/2010 and was started on Trazodone to help with sleep.  Pt states he was doing fine on med and states his sleep had improved. But started to notice a rash on his body that started this past Saturday.  Pt states the rash has gotten worse and now covers his entire body (arms,legs, stomach, back, face and head) Pt states he stopped taking the Trazodone as of yesterday.  Pt denies any swelling or sob.  He states the rash is pink in color and itches. States his head and face itch the worse.  Pt also states the Trazodone has affected his prostate.  Pt states he has an enlarged prostate but was not having difficulty urinating but states now he has an interrupted stream.    Also, pt states he has developed a non productive cough-esp worse during the daytime.  Pt states he had some Tussionex in the past and requests a refill on this.    KC out of office this PM.  Will forward message to doc of the day to address.    Allergies: Sulfa

## 2010-11-15 NOTE — Telephone Encounter (Signed)
Ok tussionex 200 ml, no refill  1 teaspoon twice daily if needed.  Tell him the antihistamine in this will also help his rash, but might possibly aggravate the prostate symptoms (probably not as badly as trazodone).  Add trazodone to med allergy list/ rash.

## 2010-11-15 NOTE — Telephone Encounter (Signed)
Called and spoke with patient-aware of Tussionex Rx called to pharmacy ([facility] [address]) and updated allergy list.

## 2010-11-19 ENCOUNTER — Telehealth: Payer: Self-pay | Admitting: Pulmonary Disease

## 2010-11-19 ENCOUNTER — Encounter: Payer: Self-pay | Admitting: Pulmonary Disease

## 2010-11-19 NOTE — Telephone Encounter (Signed)
Spoke with pt and he states the trazadone caused him to have a rash so he stopped taking it last Thursday. Pt is requesting something different be called into [facility] [address]. Please advise Dr. Shelle Iron thanks  Allergies  Allergen Reactions  . Sulfonamide Derivatives   . Trazodone And Nefazodone Rash    Carver Fila, CMA

## 2010-11-19 NOTE — Telephone Encounter (Signed)
Spoke with pt and notified of the above recs per [facility].  Pt verbalized understanding.

## 2010-11-19 NOTE — Telephone Encounter (Signed)
As we discussed at great length at his last visit, he needs behavioral therapy much more than he needs medications.  Since the trazodone was not tolerated, would not recommend any other med.  I think he needs to get with his current therapist for cognitive behavioral therapy, and to have Dr. Zachery Dauer to send to a different therapist if his current one does not do CBT.

## 2011-01-08 NOTE — Assessment & Plan Note (Signed)
[facility]                            CARDIOLOGY OFFICE NOTE   Jeffrey Cobb, Jeffrey Cobb                     MRN:          [id number]  DATE:10/20/2007                            DOB:          1940/11/11    [name] returns today discuss the findings of his studies.  He had  an abnormal chest x-ray with suggestion of thoracic aortic aneurysm.  Chest CT showed a normal sized thoracic aorta with mild atherosclerotic  calcifications in the arch.  There is no aneurysm or resection.  There  was no pulmonary embolus as well.  He did have a 4.5 mm right middle  lobe nodule.  Suggested noncontrast CT in 6 months was recommended.  We  will arrange for this I have discussed this with the patient.   Carotid Doppler showed nonobstructive plaque with antegrade flow on the  right and retrograde on the left.  There was no evidence of steal  however.  He is not symptomatic from this.  His stress Myoview showed an  exercise for 7 minutes with an EF 58% with no ischemia.   1. I had a long talk with him and his wife today.  I shared the good      news.  I have suggested fasting lipids and a comprehensive      metabolic panel and strong consideration of a statin to get his LDL      to 60 to 70.  He agrees with this plan.  He will return for blood      work.  2. He also needs a repeat CT scan in 6 months.  I have discussed this      with him and his wife.  3. I will plan on seeing him back on annual basis.  Will need to      follow up for any symptomatic left vertebral steal are subclavian      steal.     Jesse Sans. Daleen Squibb, MD, [facility]  Electronically Signed    TCW/MedQ  DD: 10/20/2007  DT: 10/20/2007  Job #: [id number]   cc:   Kerry Kass, M.D. [facility]

## 2011-01-08 NOTE — Assessment & Plan Note (Signed)
[facility]                            CARDIOLOGY OFFICE NOTE   Jeffrey Cobb, Jeffrey Cobb                     MRN:          [id number]  DATE:10/27/2008                            DOB:          01-16-1941    Jeffrey Cobb comes in today for followup.   He has lost 28 pounds and looks remarkably good.  His blood pressure is  down to 124/79 and his heart rates in the 80s.   He is having no symptoms of angina or ischemia.  He is currently on  aspirin 81 mg a day, but no other true cardiac meds.   He is due lipids, but has eaten this morning.  We decided not to treat  him with a statin because of a high HDL and a total cholesterol HDL  ratio of 3.   PHYSICAL EXAMINATION:  VITAL SIGNS:  His blood pressure today is 124/79,  his pulse is 80 and regular, and his weight is 193.  HEENT:  Normal.  NECK:  Supple.  Carotid upstrokes are equal bilaterally without bruits,  no JVD.  Thyroid is not enlarged.  Trachea is midline.  LUNGS:  Clear to  auscultation and percussion.  HEART:  A regular rate and rhythm.  No gallop.  ABDOMEN:  Soft, good bowel sounds.  No midline bruit.  No pulsatile  mass.  EXTREMITIES:  No cyanosis, clubbing, or edema.  Pulses are intact.  NEURO:  Intact.   His electrocardiogram is essentially normal.   Repeat CT for pulmonary nodules was obtained on April 20, 2008.  This  showed no change.  At this point, he is totally asymptomatic.  I do not  recommend or repeat CT.   PLAN:  1. Fasting lipids and a comprehensive metabolic panel.  2. See me back in a year.  3. If he develops any pulmonary symptoms which I reviewed in detail or      cardiac symptoms, he will come back sooner.      Thomas C. Daleen Squibb, MD, [facility]  Electronically Signed    TCW/MedQ  DD: 10/27/2008  DT: 10/27/2008  Job #: [id number]

## 2011-01-08 NOTE — Assessment & Plan Note (Signed)
[facility]                            CARDIOLOGY OFFICE NOTE   JESSEE, MEZERA                     MRN:          [id number]  DATE:09/22/2007                            DOB:          1941/07/14    I was asked by Dr. Stevphen Rochester to consult on Jeffrey Cobb for an  enlarged aorta.  This was seen on a chest x-ray couple weeks ago for  upper respiratory infection/allergy.   HISTORY OF PRESENT ILLNESS:  [name] is a delightful 70 year old  gentleman who comes with his wife today for the above problem.   Back in early December he developed a dry cough.  He went to Stevphen Rochester who has seen him in the past for allergies.  This is not cleared  with coughing or with steroids.  A chest x-ray was taken a couple weeks  ago, which I do not have a copy of.  This showed apparently an enlarged  aorta.   His cardiac risk factors include age, sex, and mild to moderate carotid  disease by a [employer] screen March 21, 2006.  He has no symptoms of TIAs  or stroke history.  He denies any history of hypertension and his  cholesterol always has been less than 200.  He quit smoking in the 80s  and does not have diabetes and no premature coronary disease history.   He has had a little bit of tightness in his chest with this dry cough.  He gets a little short of breath with exertion.  He had no true angina  and he has had no symptoms of ripping or tearing in the back.   CURRENT MEDICATIONS:  1. Mirapex 0.5 mg nightly.  2. Doxazosin 2 mg b.i.d.  3. Celebrex 200 mg one p.r.n.  4. Clonazepam 1 mg nightly.  5. Pantoprazole 40 mg daily.  6. Vitamin D 400 units a day.  7. Aspirin 81 mg a day.  8. Sertraline 100 mg one daily.  9. Multivitamin.   HE IS INTOLERANT OF SULFA.   He quit smoking in 83.  He does not drink.   SURGICAL HISTORY:  1. Had a right ankle reconstruction in 1986.  2. Had a ruptured disk in 2000.  3. Left knee replacement in 2001.  4. Right knee replacement 2005.   FAMILY HISTORY:  Is negative for premature coronary disease.   SOCIAL HISTORY:  He is retired as of 2004.  Married, has two children.   REVIEW OF SYSTEMS:  Other than allergies and hay fever, has a history of  an ulcer, stomach and bowel disorder, gastroesophageal reflux, sexual  dysfunction, arthritis, anxiety, depression.   PHYSICAL EXAMINATION:  He is a very pleasant gentleman in no acute  distress.  He is 5 feet, 10 inches. Weighs 210 pounds.  His blood  pressure 157/90 today, which is unusual for him usually it is 120/80.  His heart rate is 72.  EKG is essentially normal except for left anterior fascicular block.  HEENT:  Normocephalic, atraumatic.  PERRLA.  Extraocular movements are  intact.  Sclerae clear.  Face symmetry  is normal.  Carotid upstrokes are  equal bilaterally without bruits.  NECK: Is supple.  Thyroid is not enlarged.  There is no pulsatile mass  in suprasternal notch. He has no axillary or subclavicular bruits.  LUNGS:  Clear to auscultation and percussion.  There is no posterior bruit or to and fro murmur.  HEART:  Reveals a nondisplaced PMI.  Normal S1-S2.  There is no aortic  insufficiency.  S2 splits physiologically.  ABDOMEN: Exam is soft, good bowel sounds.  No obvious organomegaly.  There is no midline bruit.  There is no tenderness.  EXTREMITIES:  No cyanosis, clubbing or edema.  Pulses were brisk, both  dorsalis pedis and posterior tibial. NEURO:  Exam is intact.  SKIN:  Unremarkable.   ASSESSMENT:  1. Abnormal chest x-ray suggesting an aortic aneurysm or aortic root      enlargement.  2. Question hypertension.  This may just be anxiety from this visit.  3. Several cardiac risk factors with dyspnea on exertion.   PLAN:  1. CT with contrast to rule out an ascending aortic or thoracic aortic      aneurysm.  2. Exercise rest/stress Myoview after we clear him from the aneurysm.  3. Carotid Dopplers for history of  carotid disease.     Thomas C. Daleen Squibb, MD, [facility]  Electronically Signed    TCW/MedQ  DD: 09/22/2007  DT: 09/22/2007  Job #: [id number]   cc:   Kerry Kass, M.D. [facility]

## 2011-01-11 ENCOUNTER — Encounter: Payer: Self-pay | Admitting: Cardiology

## 2011-01-11 NOTE — Procedures (Signed)
[place]  Patient:    Jeffrey Cobb, Jeffrey Cobb                     MRN: [id number] Proc. Date: 02/21/00 Adm. Date:  [id number] Attending:  Colbert Ewing CC:         Loreta Ave, M.D.                           Procedure Report  DATE OF BIRTH:  September 20, 1930  PROCEDURE:  SURGEON:  Judie Petit, M.D.  INDICATIONS:  This is a 70 year old male with a history of hypertension, scheduled for total knee replacement by Loreta Ave, M.D. today.  Risks preoperatively were discussed in detail with the patient, particularly of bleeding, infection, nerve damage, worsening pain, spinal with headache, numbness, etc.  The patient agreed to the procedure and knowing that we would do it after the knee replacement in the operating room still under general anesthesia.  DESCRIPTION OF PROCEDURE:  After an uneventful total knee replacement by Dr. Eulah Pont, the patient was placed in the right lateral decubitus position.  A 17-gauge Tuohy needle was used with loss of resistance technique at the L2-3 interspace.  Unsuccessful initial attempt at L3-4.  Preservative-free normal saline was used with loss of resistance technique.  There was no heme or CSF aspiration.  Test dose was negative _____ cc of 1.5% Xylocaine with 1:200,000 epinephrine.  The catheter was subsequently placed at 3 cm without difficulty.  The needle was subsequently withdrawn without difficulty.  The catheter was subsequently secured on the patients back, and the patient was turned to the supine position, and awakened from general anesthesia without difficulty.  The patient will be placed on 1/16% bupivacaine with fentanyl 5 mcg/cc.  The patient will be followed by the anesthesia team. DD:  02/21/00 TD:  02/23/00 Job: [id number]

## 2011-01-11 NOTE — Op Note (Signed)
NAME:  Jeffrey Cobb, Jeffrey Cobb NO.:  [id number]   MEDICAL RECORD NO.:  [id number]                   PATIENT TYPE:  INP   LOCATION:  2550                                 FACILITY:  [facility]   PHYSICIAN:  Loreta Ave, M.D.              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   OPERATIVE PROCEDURE:  Right total knee replacement with soft tissue  balancing.  Cemented number 9 Osteonix femoral component, posterior  stabilized.  Cemented number 9 tibial component with 10 mm posterior  stabilized flex polyethylene insert.  Cemented recessed 28 mm patellar  component.   SURGEON:  Loreta Ave, M.D.   ASSISTANT:  Arlys John D. Petrarca, P.A.-C.   ANESTHESIA:  General.   ESTIMATED BLOOD LOSS:  Minimal.   TOURNIQUET TIME:  1 hour 20 minutes.   SPECIMENS:  Excised bone and soft tissue.   CULTURES:  None.   COMPLICATIONS:  None.   DRESSINGS:  Soft compressive with knee immobilizer.   PROCEDURE:  The patient was brought to the operating room and after adequate  anesthesia had been obtained, tourniquet applied to the upper aspect of the  right leg.  Prepped and draped in the usual sterile fashion.  Exsanguination  with elevation of Esmarch and tourniquet inflated to 350 mmHg.  The knee  examined.  5 degrees varus correctable to neutral.  5 degree flexion  contracture.  Flexion to 120 degrees.  A straight incision above the patella  down to the tibial tubercle.  Medial parapatellar arthrotomy.  Hemostasis  with cautery.  A large prepatellar bursa resected.  The knee exposed.  Grade  4 changes throughout.  Remnants of the menisci, cruciate ligaments, loose  bodies, and spurs all  removed.  Distal femur exposed.  Intramedullary guide  placed.  Distal cut removing 10 mm set at 5 degrees of valgus.  Sized for  number 9 component.  Jigs put in place restoring normal external rotation.  Definitive cuts made.  Trial put in place and found to fit well.  Trial  removed.  The tibia exposed.  The tibial spine removed with a saw.  Intramedullary guide placed.  Proximal cut 5 degree posterior slope  resecting 5 mm off the deficient medial side.  Sized for a number 9  component.  The patella was sized, reamed, and drilled for a 28 mm  component.  The trials were put in place.  A large Baker's cyst  posteromedially was drained and a window made within this to allow further  drainage.  With trials in place, number 9 on the femur, number 9 on the  tibia, and a 10 mm insert  as well as a 28 mm component of the patella.  I  had a nicely balanced knee with full extension, full flexion, no lift off of  flexion, and good collateral stability.  Good patellofemoral tracking.  The  tibia was marked for appropriate rotation and reamed.  All trials were  removed.  The knee was copiously irrigated with the pulse irrigating device.  Cement prepared and placed on all components which were firmly seated and  excess cement removed.  Polyethylene attached attached to the tibia.  The  knee reduced.  Once the cement hardened, the knee was re-examined.  I was  very pleased with alignment, stability, motion, and patellofemoral tracking.  A Hemovac was placed and brought out through a separate stab wound.  Arthrotomy closed with #1 Vicryl, skin and subcutaneous tissue with Vicryl  and staples.  Margins of the wound and knee injected with Marcaine.  Sterile  compressive dressing applied.  Tourniquet deflated and removed.  Anesthesia  reversed.  Brought to the recovery room.  Tolerated the surgery well without  complications.                                               Loreta Ave, M.D.     DFM/MEDQ  D:  02/09/2004  T:  02/09/2004  Job:  [id number]

## 2011-01-11 NOTE — Op Note (Signed)
[place]  Patient:    Jeffrey Cobb, Jeffrey Cobb                     MRN: [id number] Proc. Date: 02/21/00 Adm. Date:  [id number] Attending:  Colbert Ewing                           Operative Report  PREOPERATIVE DIAGNOSIS:  End-stage degenerative arthritis, bone-on-bone, left knee.  POSTOPERATIVE DIAGNOSIS:  End-stage degenerative arthritis, bone-on-bone, left knee with varus alignment, mild flexion contracture and lateral patellar tracking.  OPERATION PERFORMED:  Left total knee replacement utilizing Osteonics prosthesis.  Cruciate retaining type.  Press-Fit #9 femoral component. Cemented #9 tibial component with 10 mm polyethylene insert.  Cemented recessed 28 mm nonmetal backed patellar component.  Appropriate soft tissue balancing with medial release and also lateral retinacular release.  SURGEON:  Loreta Ave, M.D.  ASSISTANT:  Arlys John D. Petrarca, P.A.-C.  ANESTHESIA:  General.  ESTIMATED BLOOD LOSS:  Minimal.  TOURNIQUET TIME:  One hour 15 minutes.  SPECIMENS:  Bone and soft tissue.  CULTURES:  None.  COMPLICATIONS:  None.  DRESSING:  Soft compressive with knee immobilizer.  DESCRIPTION OF PROCEDURE:  The patient was brought to the operating room and after adequate anesthesia had been obtained, left knee examined.  5 degree flexion contracture, further flexion 110.  Alignment in varus but with stable ligaments.  Marked crepitus.  Lateral tracking, tethering patellofemoral joint.  Tourniquet applied.  Prepped and draped in the usual sterile fashion. Exsanguinated with elevation and Esmarch.  Tourniquet inflated to 350 mmHg. Incision above the patella down to the tibial tubercle.  Hemostasis obtained with electrocautery.  Medial parapatellar arthrotomy.  Knee exposed.  Remnants of menisci.  Anterior cruciate ligament which had marked replacement with fatty tissue, all removed.  Associated periarticular spurs all  debrided. Posterior cruciate ligament retained as it was in good condition.  Appropriate release of the medial structures to obtain a balanced knee.  Distal femur exposed.  Intramedullary guide placed.  A 10 mm cut set at 5 degrees of valgus.  Sized for a #9 component.  Jigs put in place.  Definitive cuts made. Trial put in place and found to fit well.  Trial removed.  Tibia exposed with appropriate retractors.  Tibial spine removed with a saw.  The intramedullary guide placed.  4 mm removed with a 5 degree posterior slope cut with the intramedullary guide.  Measuring from the medial side.  All jigs removed. Trials put in placed, marked for rotation.  With the 10 mm implant with a 39 above and below I had full extension, full flexion, good stability and alignment.  The tibia was then hand reamed after marking it for rotation. Patella was exposed, sized reamed and drilled for a 28 mm component.  All trials put in place, knee examined.  Sufficient release medially for a balanced knee set at 5 degrees of valgus.  The patellofemoral joint had lateral tracking and tethering, necessitating lateral release.  Once complete, good tracking.  All trials removed.  Copious irrigation with a pulse irrigating device.  Cement prepared, placed on tibial and patellar components which were well seated.  Excessive cement removed.  Polyethylene attached to the tibia.  Femoral component hammered in place and the knee reduced.  Once cement had hardened, the knee was re-examined.  Full extension, full flexion, good patellofemoral tracking.  The wound irrigated again.  Hemovacs were  placed and brought out through separate stab wounds.  Arthrotomy closed with #1 Vicryl.  Skin and subcutaneous tissues with Vicryl and staples.  Margins of the wound and knee injected with Marcaine.  Sterile compressive dressing applied.  Tourniquet inflated and removed.  Knee immobilizer applied. Anesthesia was then to proceed with the  placement of an epidural catheter for postoperative analgesia.  Following this, anesthesia reversed.  Brought to recovery room.  Tolerated surgery well.  No complications. DD:  02/21/00 TD:  02/22/00 Job: [id number]

## 2011-01-11 NOTE — Discharge Summary (Signed)
NAME:  Jeffrey Cobb, APPLEGATE NO.:  [id number]   MEDICAL RECORD NO.:  [id number]                   PATIENT TYPE:  INP   LOCATION:  5005                                 FACILITY:  [facility]   PHYSICIAN:  Loreta Ave, M.D.              DATE OF BIRTH:  25-Nov-1940   DATE OF ADMISSION:  02/09/2004  DATE OF DISCHARGE:  02/14/2004                                 DISCHARGE SUMMARY   ADMISSION DIAGNOSIS:  Advanced degenerative joint disease of the right knee.   DISCHARGE DIAGNOSES:  1. Advanced degenerative joint disease of the right knee.  2. Hematuria.  3. Hypertension.  4. Hypercalcemia.   PROCEDURE:  Right total knee replacement.   HISTORY:  A 70 year old white male status post left total knee replacement  with good results.  He is now having significant pain on activity as well as  rest pain.  Recent injection lasted only lasted one week.  He has  radiographic end-stage DJD.  He is now indicated for a right total knee  replacement.   HOSPITAL COURSE:  A 70 year old white male admitted February 09, 2004 after  appropriate laboratory studies were obtained as well as 1 gram Ancef IV on  call to the operating room.  He was taken to the operating room where he  underwent a right total knee replacement.  A Foley was placed  intraoperatively.  He was continued postoperatively on Ancef 1 gram IV q.8  hours times three doses.  Heparin 5000 units subcu q.12 hours until his  Coumadin became therapeutic.  Continue with his routine admit meds.  CPM  from 0 to 30 degrees for 8 to 10 hours per day increasing by 10 degrees.  PT, OT consults were made.  Ambulation weightbearing as tolerated.  Dilaudid  PCA pump was used for postop pain management as well as a femoral nerve  block.  He was allowed out of bed to the chair the following day.  His PCA  was discontinued and he was placed on Percocet.  He did have some  hypercalcemia and some hyponatremia and this was corrected with  fluid  restriction and oral calcium.  He did have some temperature elevation and he  had blood cultures ordered if his temp was greater than 100 degrees.  He did  have a workup for his temperature on February 13, 2004, which included that of a  CBC and diff, liver functions, clean-catch urine.  He became hypokalemic and  20 meq of KCl was given b.i.d. for 24 hours.  Chest x-ray was also ordered.  He really did not have any areas of infection, so his Keflex which had been  started on February 12, 2004 was discontinued.  PCA was drawn because of blood  in his urine.  A urology consult was made for his urologist to see him.  He  felt that there was nothing acute and that he would follow  him back up in  the office.  He had an unremarkable hospital course otherwise and was  discharged on February 14, 2004 to return back to our office in 10 to 14 days  for recheck evaluation.  EKG showed normal sinus rhythm with a left axis  deviation, nonspecific T wave abnormality which is new from May 15, 1999.  Radiographic studies -- right knee reveals good position and  alignment following right total knee replacement.  Chest x-ray February 13, 2004  reveals no evidence of pulmonary infiltrates, edema, or pleural effusions.  No significant atelectasis is present postoperatively.  Heart size is stable  and within normal limits.  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  No active disease.   LABORATORY STUDIES:  Admitted with a hemoglobin of 14.9, hematocrit 42.6  percent, white count 7600, platelets 169,000.  His platelet count had  dropped to 109,000, but at the time of his discharge is back to normal.  Discharge hemoglobin 8.7, hematocrit 24.9 percent, white count 6200,  platelets 150,000.  Retic count was 1.7, absolute retic was 47.1.  Red cell  count was 2.77.  Pro time at time of discharge was 17.5 with an INR of 1.7.  Chemistries preoperatively -- sodium 140, potassium 3.8, chloride 107,  CO2  26, glucose 91, BUN 10, creatinine 1.3, calcium 9.3, total protein 6.9,  albumin 4.0, AST 23, ALT 20, Alp70.  Total bilirubin 0.7.  Discharge sodium  137, potassium 3.8, chloride 105, CO2 27, glucose 106, BUN 11, creatinine  1.1, calcium 8.5.  His potassium dropped to 3.3 during his hospital course  at its lowest.  Calcium at the lowest was 7.7.  PSA was 0.08.  Urinalysis  February 07, 2004 was benign.  Urinalysis February 13, 2004 revealed moderate  hemoglobin, 3 to 6 red cells, no bacteria, and 0 to 2 white cells.  Blood  type was O positive, antibody screen negative.  He did have a urinalysis  February 14, 2004 again showing moderate hemoglobin, red cells 7 to 10, no white  cells, and no bacteria.   DISCHARGE INSTRUCTIONS:  Given a prescription for Percocet 5/325 1 to 2 tabs  every 4 hours as needed for pain.  Coumadin 5 mg as directed by Genevieve Norlander  pharmacist.  Lovenox 30 mg injected every 12 hours until stopped by [place].  Do not take aspirin, ibuprofen, or naproxen-containing medications.  Colace  100 mg b.i.d.  Iron sulfate 325 mg b.i.d.  Diet as tolerated.  Activity as  taught in PT.  Follow the blue instruction sheet.  Call for increased pain,  swelling, drainage, or temperature over 101 degrees.  Keep his wound clean  and dry and cover with a dressing daily.  May shower with Saran wrap over  the incision.  Call the office to make an appointment to be seen two weeks  from date of surgery.  Discharged in improved condition.      Jeffrey Cobb, P.A.-C.                Loreta Ave, M.D.    BDP/MEDQ  D:  03/16/2004  T:  03/17/2004  Job:  [id number]

## 2011-01-11 NOTE — Discharge Summary (Signed)
[place]  Patient:    Jeffrey Cobb, Jeffrey Cobb                     MRN: [id number] Adm. Date:  [id number] Disc. Date: [id number] Attending:  Colbert Ewing Dictator:   Oris Drone. Petrarca, P.A.-C.                           Discharge Summary  ADMISSION DIAGNOSIS:  Advanced degenerative joint disease left knee.  DISCHARGE DIAGNOSIS:  Advanced degenerative joint disease left knee.  PROCEDURE:  Left total knee replacement.  HISTORY OF PRESENT ILLNESS:  This patient has had longstanding left knee pain with advanced DJD.  He has failed conservative treatment including that of Synvisc injections.  He is now having problems with his activities of daily living.  He is now indicated for left total knee replacement.  HOSPITAL COURSE:  A 70 year old male admitted February 21, 2000, and after appropriate laboratory studies were obtained as well as 1 g Ancef IV on call to the operating room, he was taken to the operating room where he underwent a left total knee replacement. He tolerated the procedure well.  He was placed with an epidural catheter for postoperative pain control.  Coumadin and heparin were begun for antithrombotic prophylaxis.  Consultations with PT, OT, and rehab were instituted.  CPM 0 to 30 degrees incremented by 10 degrees daily.  Ambulation weightbearing as tolerated on the left.  He had an unremarkable postoperative course.  His epidural was pulled on February 23, 2000, and he was placed on Percocet one to two q.4h. p.r.n. pain.  He did well with his physical therapy.  On February 25, 2000, he did have elevation of his temperature.  At that time CBC, chest x-ray, and blood cultures x 2 with a urine culture were obtained.  His temperature improved.  He had a guaiac performed on February 27, 2000, which was negative and he was discharged on that day in improved condition to return back to the office in seven to 10 days for staple removal.  RADIOGRAPHIC  STUDIES:  February 21, 2000, showed satisfactory appearance following total knee replacement.  Chest x-ray of February 25, 2000, showed lungs are clear, heart mediastinal structures are normal.  LABORATORY STUDIES:  February 15, 2000, showed white count of 6.1, hemoglobin 15.7, hematocrit 46.7%, white count 159,000.  February 27, 2000, shows white count of 8400, hemoglobin 9.0, hematocrit 25.8%, platelet count was 199,000.  Occult blood of February 27, 2000, was negative.  Chemistries on February 15, 2000, reveals sodium 139, potassium 4.5, chloride 106, CO2 27, glucose 100, BUN 14, creatinine 1.2, calcium 9.5, total protein 6.8, albumin 4.0, AST 22, ALT 27, ALP 59, total bilirubin 1.0.  February 24, 2000, showed sodium 138, potassium 3.8, chloride 103, CO2 28, glucose 110, BUN 11, creatinine 1.2, calcium 8.3. Urinalysis of February 21, 2000, showed 0 to 5 whites, 0 to 5 reds, rare bacteria. February 25, 2000, showed 30 protein, no white cells, 11 red cells, few bacteria. He was blood type O positive, antibody screen negative.  Blood cultures x 2 showed no growth.  Urine culture of February 25, 2000, revealed greater than 100,000 colonies, multiple species and recollection was suggested.  DISCHARGE MEDICATIONS: 1. He was given a prescription for Percocet one or two tablets q.4h. p.r.n.    pain. 2. Coumadin 5 mg take as directed by [facility]. 3. Iron 325  mg one tablet daily. 4. Continue home medications as before.  DISCHARGE INSTRUCTIONS:  Home health RN to draw protimes.  May weightbear as tolerated with his walker.  Continue with his home exercise program.  Keep wound clean and dry and may shower.  Call if increased temperature greater than 101 degrees.  FOLLOW-UP:  He will make an appointment to be seen in the office in seven to 10 days for follow-up.  DISCHARGE CONDITION:  Improved. DD:  04/14/00 TD:  04/15/00 Job: [id number]

## 2011-01-11 NOTE — Op Note (Signed)
Jeffrey Cobb, [place] NO.:  [id number]   MEDICAL RECORD NO.:  [id number]          PATIENT TYPE:  INP   LOCATION:  5011                         FACILITY:  [facility]   PHYSICIAN:  Loreta Ave, M.D. 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   PROCEDURES PERFORMED:  Exploration of right shoulder with resection of  large subacromial bursa.  Total shoulder replacement, Stryker  prosthesis.  Cemented pegged #7 polyethylene glenoid component.  Press-  Fit humeral component with a #14-mm stem and a 45 x 18-mm head with a 4-  mm offset.   ANESTHESIA:  General.   SURGEON:  Loreta Ave, M.D.   ASSISTANT:  Zonia Kief, P.A.   SPECIMENS:  None.   CULTURES:  None.   COMPLICATIONS:  None.   DRESSINGS:  Sterile compressive with shoulder immobilizer.   ESTIMATED BLOOD LOSS:  150 mL.   BLOOD REPLACED:  None.   COMPLICATIONS:  None.   DESCRIPTION OF PROCEDURE:  The patient was brought to the operating room  and placed on the operating table in supine position.  After adequate  anesthesia had been obtained, the right arm was examined.  About 80% of  normal motion, surprising given his degree of arthritis.  Placed in a  beach-chair position and the shoulder was then sterilely prepped and  draped in the usual sterile fashion.  Zonia Kief assisted throughout  the entire procedure with me.   An incision from the coracoid along the deltopectoral interval.  Skin  and subcutaneous tissue was divided.  The deltopectoral interval  developed and retractors were put in place.  Conjoined tendon was  exposed and retracted.  The front of the shoulder was  then exposed, and  there was a large cystic mass.  I was not sure what this was at the  beginning, but it turned out to be a large subacromial bursal swelling.  This had almost 10-20 mL of fluid markedly thickened.  Able to excise  this in its entirety.  The top of the rotator cuff some attrition, but  intact.  Subscap was taken down for anatomic repair later.  The shoulder  exposed.  Humeral head was cut at 30 degrees of retroversion and then  used to measure for the replacement head.  The glenoid exposed.  There  were so many spurs all around this that were partially loose, partially  detached, and some firm, that it increased the size of the glenoid to  almost twice fold.  Spurs were sequentially removed, getting this back  to the normal-sized glenoid.  It was then reamed and drilled and sized  for a #7 component, with the top of the component right at the bottom of  the biceps which was still intact.  Copious irrigation.  Cement  prepared.  The pegged component firmly cemented, excessive  cement  removed.  Good capturing and fixation and alignment.  Attention was  turned to the humerus.  Handheld reaming up to a #14 component.  After  appropriate trials, the #14 component was firmly seated at 30 degrees of  retroversion.  The 45 x 18 head was attached with the offset so that I  would get symmetric coverage of the entire proximal humerus.  At  completion, a solid, stable fixation of the humeral component and head.  Shoulder reduced with excellent motion.  Wound irrigated.  Subscap  repaired anatomically with FiberWire.  Retractor removed.  The wound  closed with Vicryl and then staples.  Margin of wound injected with  Marcaine.  A sterile compressive dressing applied.  Sling applied.  The  anesthesia reversed.  Brought to recovery room.  Tolerated surgery well  with no complications.      Loreta Ave, M.D.  Electronically Signed     DFM/MEDQ  D:  08/07/2006  T:  08/08/2006   Job:  [id number]

## 2011-01-16 ENCOUNTER — Encounter: Payer: Self-pay | Admitting: Cardiology

## 2011-01-16 ENCOUNTER — Ambulatory Visit (INDEPENDENT_AMBULATORY_CARE_PROVIDER_SITE_OTHER): Payer: Medicare Other | Admitting: Cardiology

## 2011-01-16 VITALS — BP 122/80 | HR 69 | Resp 17 | Ht 70.0 in | Wt 201.0 lb

## 2011-01-16 DIAGNOSIS — I6529 Occlusion and stenosis of unspecified carotid artery: Secondary | ICD-10-CM

## 2011-01-16 DIAGNOSIS — E782 Mixed hyperlipidemia: Secondary | ICD-10-CM

## 2011-01-16 DIAGNOSIS — I2510 Atherosclerotic heart disease of native coronary artery without angina pectoris: Secondary | ICD-10-CM

## 2011-01-16 MED ORDER — ASPIRIN EC 81 MG PO TBEC: 81.0000 mg | DELAYED_RELEASE_TABLET | Freq: Every day | ORAL | Status: AC

## 2011-01-16 NOTE — Assessment & Plan Note (Signed)
Will check fasting labs

## 2011-01-16 NOTE — Patient Instructions (Addendum)
Your physician recommends that you return for lab work in: Thursday May 24,12 for fasting lab work We will call you with your test results.  Your physician recommends that you schedule a follow-up appointment in: 1 year with Dr. Daleen Squibb Your physician has recommended you make the following change in your medication:  Start Aspirin 81mg  daily

## 2011-01-16 NOTE — Assessment & Plan Note (Signed)
Stable...will restart ASA. Followup carotids in 2/13.

## 2011-01-16 NOTE — Progress Notes (Signed)
   Patient ID: Jeffrey Cobb, male    DOB: 12-14-1940, 70 y.o.   MRN: [id number]  HPI [name] returns for E and M of his Carotid Disease. He is asx. He stopped his ASA in 12/11 for back surgery. He has history of HL but is not taking a med for this. He wants his labs checked for this. Last carotids were stable 2/11 to be repeated in 2 years.  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   Review of Systems  All other systems reviewed and are negative.      Physical Exam  Nursing note and vitals reviewed. Constitutional: He is oriented to person, place, and time. He appears well-developed and well-nourished. No distress.  HENT:  Head: Atraumatic.  Eyes: EOM are normal. Pupils are equal, round, and reactive to light. Left eye exhibits no discharge.  Neck: Neck supple. No JVD present. No tracheal deviation present. No thyromegaly present.  Cardiovascular: Normal rate, regular rhythm, normal heart sounds and intact distal pulses.   No murmur heard. Pulmonary/Chest: Effort normal and breath sounds normal.  Abdominal: Soft. Bowel sounds are normal. He exhibits no distension.  Musculoskeletal: Normal range of motion. He exhibits no edema.  Neurological: He is alert and oriented to person, place, and time.  Skin: Skin is warm and dry.  Psychiatric: He has a normal mood and affect.

## 2011-01-17 ENCOUNTER — Other Ambulatory Visit (INDEPENDENT_AMBULATORY_CARE_PROVIDER_SITE_OTHER): Payer: Medicare Other | Admitting: *Deleted

## 2011-01-17 DIAGNOSIS — E782 Mixed hyperlipidemia: Secondary | ICD-10-CM

## 2011-01-17 DIAGNOSIS — I2510 Atherosclerotic heart disease of native coronary artery without angina pectoris: Secondary | ICD-10-CM

## 2011-01-17 LAB — BASIC METABOLIC PANEL
BUN: 15 mg/dL (ref 6–23)
Chloride: 104 mEq/L (ref 96–112)
GFR: 68.83 mL/min (ref >60.00)
Glucose, Bld: 99 mg/dL (ref 70–99)
Potassium: 5 mEq/L (ref 3.5–5.1)
Sodium: 138 mEq/L (ref 135–145)

## 2011-01-17 LAB — HEPATIC FUNCTION PANEL
ALT: 19 U/L (ref 0–53)
Bilirubin, Direct: 0.1 mg/dL (ref 0.0–0.3)
Total Bilirubin: 0.8 mg/dL (ref 0.3–1.2)
Total Protein: 6.7 g/dL (ref 6.0–8.3)

## 2011-01-17 LAB — LIPID PANEL
HDL: 67.4 mg/dL (ref >39.00)
LDL Cholesterol: 90 mg/dL (ref 0–99)
VLDL: 8 mg/dL (ref 0.0–40.0)

## 2011-02-18 ENCOUNTER — Other Ambulatory Visit: Payer: Self-pay | Admitting: Pulmonary Disease

## 2011-02-28 ENCOUNTER — Other Ambulatory Visit: Payer: Self-pay | Admitting: Pulmonary Disease

## 2011-04-01 ENCOUNTER — Other Ambulatory Visit: Payer: Self-pay | Admitting: Pulmonary Disease

## 2011-04-03 ENCOUNTER — Telehealth: Payer: Self-pay | Admitting: Pulmonary Disease

## 2011-04-03 MED ORDER — PANTOPRAZOLE SODIUM 40 MG PO TBEC: 40.0000 mg | DELAYED_RELEASE_TABLET | Freq: Two times a day (BID) | ORAL | Status: DC

## 2011-04-03 NOTE — Telephone Encounter (Signed)
Rx has been sent and [facility] is aware

## 2011-04-05 ENCOUNTER — Other Ambulatory Visit: Payer: Self-pay | Admitting: Orthopedic Surgery

## 2011-04-05 DIAGNOSIS — M542 Cervicalgia: Secondary | ICD-10-CM

## 2011-04-10 ENCOUNTER — Ambulatory Visit
Admission: RE | Admit: 2011-04-10 | Discharge: 2011-04-10 | Disposition: A | Discharge status: Home / Self Care | Payer: Medicare Other | Source: Ambulatory Visit | Attending: Orthopedic Surgery | Admitting: Orthopedic Surgery

## 2011-04-10 DIAGNOSIS — M4802 Spinal stenosis, cervical region: Secondary | ICD-10-CM

## 2011-04-10 DIAGNOSIS — M542 Cervicalgia: Secondary | ICD-10-CM

## 2011-04-10 MED ORDER — IOHEXOL 300 MG/ML  SOLN
1.0000 mL | Freq: Once | INTRAMUSCULAR | Status: AC | PRN
  Administered 2011-04-10: 1 mL via EPIDURAL

## 2011-04-10 MED ORDER — TRIAMCINOLONE ACETONIDE 40 MG/ML IJ SUSP (RADIOLOGY)
60.0000 mg | Freq: Once | INTRAMUSCULAR | Status: AC
  Administered 2011-04-10: 60 mg via EPIDURAL

## 2011-06-26 ENCOUNTER — Ambulatory Visit (INDEPENDENT_AMBULATORY_CARE_PROVIDER_SITE_OTHER): Payer: Medicare Other | Admitting: Pulmonary Disease

## 2011-06-26 ENCOUNTER — Encounter: Payer: Self-pay | Admitting: Pulmonary Disease

## 2011-06-26 DIAGNOSIS — J449 Chronic obstructive pulmonary disease, unspecified: Secondary | ICD-10-CM

## 2011-06-26 DIAGNOSIS — R05 Cough: Secondary | ICD-10-CM | POA: Insufficient documentation

## 2011-06-26 NOTE — Progress Notes (Signed)
  Subjective:    Patient ID: Jeffrey Cobb, male    DOB: 08/22/1941, 70 y.o.   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  He feels that his breathing  Is a little worse than his usual baseline, but it may fluctuate on any given day.  He is staying on his inhaler regimen.  His new issue today is an increase in cough, and this has been ongoing problem with him due to a cyclical mechanism.  He has had a recent sinusitis that was treated with antibiotics, but is unclear if he is having postnasal drip.  He is clearing his throat continually today during our visit.  He denies breakthrough reflux on his current regimen.  The cough continues to be dry and barking in nature.   Review of Systems  Constitutional: Negative for fever and unexpected weight change.  HENT: Positive for congestion and postnasal drip. Negative for ear pain, nosebleeds, sore throat, rhinorrhea, sneezing, trouble swallowing, dental problem and sinus pressure.   Eyes: Negative for redness and itching.  Respiratory: Positive for cough, chest tightness and shortness of breath. Negative for wheezing.   Cardiovascular: Negative for palpitations and leg swelling.  Gastrointestinal: Negative for nausea and vomiting.  Genitourinary: Negative for dysuria.  Musculoskeletal: Positive for joint swelling.  Skin: Negative for rash.  Neurological: Negative for headaches.  Hematological: Bruises/bleeds easily.  Psychiatric/Behavioral: Negative for dysphoric mood. The patient is nervous/anxious.        Objective:   Physical Exam Overweight male in no acute distress Nose without purulence or discharge noted Chest totally clear breath sounds bilaterally, no wheezes or rhonchi Cardiac exam is regular rate and rhythm Lower extremities are without edema, no cyanosis noted Alert and oriented, moves all 4 extremities.       Assessment & Plan:

## 2011-06-26 NOTE — Assessment & Plan Note (Signed)
The patient has mild obstructive disease, and seems to be fairly well controlled on his current regimen.  He has no bronchospasm on exam today, and I suspect a lot of his dyspnea on exertion is due to his weight and conditioning.

## 2011-06-26 NOTE — Assessment & Plan Note (Signed)
The patient has noticed increasing coughing since his sinus infection 9 weeks ago.  He doesn't feel that he is having postnasal drip, but he is constantly clearing his throat during our visit.  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  He does not feel that he is having persistent sinus pressure or purulence from his nose.  He is not having any chest congestion.  At this point, I would like to treat him with a sedating antihistamine, and have also reviewed again the behavioral treatments for cyclical coughing.  If he continues to have issues, would consider a trial of Neurontin for a neurogenic cough.

## 2011-06-26 NOTE — Patient Instructions (Signed)
No change in breathing medications. Try chlorpheniramine 8mg  after dinner to see if it helps tickle Avoid throat clearing, no overuse of voice Try hard candy, no menthol or mint, to avoid throat clearing. Continue on your reflux medications. If your cough continues, may try a different medication for chronic cough.

## 2011-07-08 ENCOUNTER — Other Ambulatory Visit: Payer: Self-pay | Admitting: Orthopedic Surgery

## 2011-07-08 DIAGNOSIS — M542 Cervicalgia: Secondary | ICD-10-CM

## 2011-07-10 ENCOUNTER — Other Ambulatory Visit: Payer: Self-pay | Admitting: Orthopedic Surgery

## 2011-07-10 ENCOUNTER — Ambulatory Visit
Admission: RE | Admit: 2011-07-10 | Discharge: 2011-07-10 | Disposition: A | Discharge status: Home / Self Care | Payer: Medicare Other | Source: Ambulatory Visit | Attending: Orthopedic Surgery | Admitting: Orthopedic Surgery

## 2011-07-10 DIAGNOSIS — M542 Cervicalgia: Secondary | ICD-10-CM

## 2011-07-10 MED ORDER — TRIAMCINOLONE ACETONIDE 40 MG/ML IJ SUSP (RADIOLOGY)
60.0000 mg | Freq: Once | INTRAMUSCULAR | Status: AC
  Administered 2011-07-10: 60 mg via EPIDURAL

## 2011-07-10 MED ORDER — IOHEXOL 300 MG/ML  SOLN
1.0000 mL | Freq: Once | INTRAMUSCULAR | Status: AC | PRN
  Administered 2011-07-10: 1 mL via EPIDURAL

## 2011-07-14 ENCOUNTER — Other Ambulatory Visit: Payer: Self-pay | Admitting: Pulmonary Disease

## 2011-07-24 ENCOUNTER — Ambulatory Visit
Admission: RE | Admit: 2011-07-24 | Discharge: 2011-07-24 | Disposition: A | Discharge status: Home / Self Care | Payer: Medicare Other | Source: Ambulatory Visit | Attending: Orthopedic Surgery | Admitting: Orthopedic Surgery

## 2011-07-24 DIAGNOSIS — M542 Cervicalgia: Secondary | ICD-10-CM

## 2011-07-24 MED ORDER — TRIAMCINOLONE ACETONIDE 40 MG/ML IJ SUSP (RADIOLOGY)
60.0000 mg | Freq: Once | INTRAMUSCULAR | Status: AC
  Administered 2011-07-24: 60 mg via EPIDURAL

## 2011-07-24 MED ORDER — IOHEXOL 300 MG/ML  SOLN
1.0000 mL | Freq: Once | INTRAMUSCULAR | Status: AC | PRN
  Administered 2011-07-24: 1 mL via EPIDURAL

## 2011-07-26 ENCOUNTER — Ambulatory Visit: Payer: Self-pay | Admitting: Pulmonary Disease

## 2011-07-27 ENCOUNTER — Other Ambulatory Visit: Payer: Self-pay | Admitting: Pulmonary Disease

## 2011-08-01 ENCOUNTER — Encounter: Payer: Self-pay | Admitting: Pulmonary Disease

## 2011-08-01 ENCOUNTER — Ambulatory Visit (INDEPENDENT_AMBULATORY_CARE_PROVIDER_SITE_OTHER): Payer: Medicare Other | Admitting: Pulmonary Disease

## 2011-08-01 DIAGNOSIS — R05 Cough: Secondary | ICD-10-CM

## 2011-08-01 DIAGNOSIS — G4700 Insomnia, unspecified: Secondary | ICD-10-CM

## 2011-08-01 DIAGNOSIS — J449 Chronic obstructive pulmonary disease, unspecified: Secondary | ICD-10-CM

## 2011-08-01 MED ORDER — GABAPENTIN 300 MG PO CAPS: 300.0000 mg | ORAL_CAPSULE | Freq: Two times a day (BID) | ORAL | Status: DC

## 2011-08-01 NOTE — Progress Notes (Signed)
  Subjective:    Patient ID: Jeffrey Cobb, male    DOB: 03/28/1941, 70 y.o.   MRN: [id number]  HPI The patient comes in today for followup of his known COPD.  He also has chronic insomnia, and is being treated by a behavioral specialist with cognitive behavioral therapy.  He also has a very long-standing chronic cough that is felt to be upper airway in origin, and most likely related to a hyper sensitized upper airway.  The cough may completely resolve for a period of time and then return.  The patient states there is no particular pattern or aggravating/relieving factors.  He has never been tried on Neurontin for a neurogenic cough.   Review of Systems  Constitutional: Negative for fever and unexpected weight change.  HENT: Negative for ear pain, nosebleeds, congestion, sore throat, rhinorrhea, sneezing, trouble swallowing, dental problem, postnasal drip and sinus pressure.   Eyes: Negative for redness and itching.  Respiratory: Positive for cough and shortness of breath. Negative for chest tightness and wheezing.   Cardiovascular: Negative for palpitations and leg swelling.  Gastrointestinal: Negative for nausea and vomiting.  Genitourinary: Negative for dysuria.  Musculoskeletal: Positive for joint swelling.  Skin: Negative for rash.  Neurological: Negative for headaches.  Hematological: Bruises/bleeds easily.  Psychiatric/Behavioral: Negative for dysphoric mood. The patient is not nervous/anxious.        Objective:   Physical Exam Well-developed male in no acute distress Nose without purulence or discharge noted Chest totally clear to auscultation Heart exam regular rate and rhythm Lower extremities without edema, no cyanosis noted Alert and oriented, moves all 4 extremities.       Assessment & Plan:

## 2011-08-01 NOTE — Patient Instructions (Signed)
Continue with your breathing medications, but take note to see if symbicort worsens or causes cough after using. Work on weight loss and conditioning. Trial of neurontin 300mg  one in am and pm for next 4 weeks.  Please call me in 4 weeks with update on your progress.  Schedule followup with me in one year.

## 2011-08-02 NOTE — Assessment & Plan Note (Signed)
The patient is doing well from a COPD standpoint on his current inhalers.  He has not had any recent acute exacerbation, and feels that his exertional tolerance is stable.

## 2011-08-02 NOTE — Assessment & Plan Note (Signed)
The patient continues to have a chronic cough that I believe is from his upper airway.  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

## 2011-09-13 ENCOUNTER — Ambulatory Visit (INDEPENDENT_AMBULATORY_CARE_PROVIDER_SITE_OTHER): Payer: Medicare Other | Admitting: Pulmonary Disease

## 2011-09-13 ENCOUNTER — Encounter: Payer: Self-pay | Admitting: Pulmonary Disease

## 2011-09-13 DIAGNOSIS — J4489 Other specified chronic obstructive pulmonary disease: Secondary | ICD-10-CM

## 2011-09-13 DIAGNOSIS — J449 Chronic obstructive pulmonary disease, unspecified: Secondary | ICD-10-CM

## 2011-09-13 DIAGNOSIS — R059 Cough, unspecified: Secondary | ICD-10-CM

## 2011-09-13 DIAGNOSIS — R05 Cough: Secondary | ICD-10-CM

## 2011-09-13 NOTE — Assessment & Plan Note (Signed)
The patient has noted that his cough has totally resolved since being off the inhalers.  This is understandable given the fact that he has a very irritable upper airway and the dry powders and aerosols can certainly exacerbate his symptoms.  As long as he is not having worsening of his respiratory status, I think he should stay off the inhalers to see if his cough will stay improved on consistent basis.

## 2011-09-13 NOTE — Patient Instructions (Signed)
Ok to come off inhalers except albuterol as needed. Let me know if you have increased symptoms.  Keep prior made apptm with me .

## 2011-09-13 NOTE — Progress Notes (Signed)
  Subjective:    Patient ID: Jeffrey Cobb, male    DOB: 08/05/1941, 71 y.o.   MRN: [id number]  HPI The patient comes in today for followup of his known COPD.  He also has a chronic cough that is associated with a hyper sensitized upper airway.  We have never been able to totally eliminate the cough despite various treatment trials.  The patient comes in today where he quit using his inhalers for a period of time and noticed that his cough totally resolved.  He also has not seen a worsening of his breathing or exercise tolerance.   Review of Systems  Constitutional: Negative for fever and unexpected weight change.  HENT: Negative for ear pain, nosebleeds, congestion, sore throat, rhinorrhea, sneezing, trouble swallowing, dental problem, postnasal drip and sinus pressure.   Eyes: Negative for redness and itching.  Respiratory: Negative for cough, chest tightness, shortness of breath and wheezing.   Cardiovascular: Negative for palpitations and leg swelling.  Gastrointestinal: Negative for nausea and vomiting.  Genitourinary: Negative for dysuria.  Musculoskeletal: Positive for joint swelling.  Skin: Negative for rash.  Neurological: Negative for headaches.  Hematological: Does not bruise/bleed easily.  Psychiatric/Behavioral: Negative for dysphoric mood. The patient is not nervous/anxious.        Objective:   Physical Exam Overweight male in no acute distress Chest with clear breath sounds throughout, no wheezing Cardiac exam with regular rate and rhythm Lower extremities without edema, no cyanosis noted       Assessment & Plan:

## 2011-09-13 NOTE — Assessment & Plan Note (Signed)
The patient has discontinued his maintenance medications, and has not seen a change in his breathing.  He has mild airflow obstruction, and therefore the reason to be on these meds is for symptom relief or to prevent recurrent exacerbations.  He has had issues with this in the past.  As long as he is not having shortness of breath issues or flareups, I am okay with being on albuterol alone.

## 2011-09-21 ENCOUNTER — Other Ambulatory Visit: Payer: Self-pay | Admitting: Pulmonary Disease

## 2012-01-07 ENCOUNTER — Encounter: Payer: Self-pay | Admitting: *Deleted

## 2012-01-17 ENCOUNTER — Ambulatory Visit: Payer: Medicare Other | Admitting: Cardiology

## 2012-01-22 ENCOUNTER — Encounter: Payer: Self-pay | Admitting: Cardiology

## 2012-01-22 ENCOUNTER — Ambulatory Visit (INDEPENDENT_AMBULATORY_CARE_PROVIDER_SITE_OTHER): Payer: Medicare Other | Admitting: Cardiology

## 2012-01-22 VITALS — BP 118/80 | HR 70 | Ht 70.0 in | Wt 204.0 lb

## 2012-01-22 DIAGNOSIS — E782 Mixed hyperlipidemia: Secondary | ICD-10-CM

## 2012-01-22 DIAGNOSIS — I6529 Occlusion and stenosis of unspecified carotid artery: Secondary | ICD-10-CM

## 2012-01-22 NOTE — Patient Instructions (Signed)
Your physician has requested that you have a carotid duplex. This test is an ultrasound of the carotid arteries in your neck. It looks at blood flow through these arteries that supply the brain with blood. Allow one hour for this exam. There are no restrictions or special instructions.  Your physician wants you to follow-up in: 1 year.  You will receive a reminder letter in the mail two months in advance. If you don't receive a letter, please call our office to schedule the follow-up appointment.  

## 2012-01-22 NOTE — Assessment & Plan Note (Addendum)
We'll arrange carotid Dopplers. If stable, repeat in one year. Aspirin was not on his list. He says he is taking it.

## 2012-01-22 NOTE — Progress Notes (Signed)
HPI Jeffrey Cobb comes in today for the evaluation and management of his carotid disease. He has not had any symptoms of TIAs or mini strokes. He denies angina. He denies claudication.  Recent lipids checked by primary care demonstrates a total cholesterol 157, triglycerides 62, HDL 46, direct LDL 91. He does not take a statin. He says he never has.  Past Medical History  Diagnosis Date  . Occlusion and stenosis of carotid artery without mention of cerebral infarction   . Obstructive chronic bronchitis with exacerbation   . Shortness of breath   . Other emphysema   . Mixed hyperlipidemia   . Persistent disorder of initiating or maintaining sleep   . Acute sinusitis, unspecified     Current Outpatient Prescriptions  Medication Sig Dispense Refill  . clonazePAM (KLONOPIN) 0.5 MG tablet At bedtime as needed.      . doxazosin (CARDURA) 2 MG tablet Take 1 tablet by mouth daily.      Marland Kitchen LEVITRA 20 MG tablet prn      . pantoprazole (PROTONIX) 40 MG tablet TAKE 1 TABLET BY MOUTH TWICE A DAY MORNING AND EVENING  60 tablet  6  . pramipexole (MIRAPEX) 0.5 MG tablet Take 1 mg by mouth at bedtime.         Allergies  Allergen Reactions  . Sulfonamide Derivatives Rash  . Trazodone And Nefazodone Rash and Other (See Comments)    Hallucinations     Family History  Problem Relation Age of Onset  . Emphysema Mother   . Heart disease Mother   . Heart disease Father   . Allergies      FH: FATHER,2 SISTER,1 BROTHER, SON  DAUGHTER    History   Social History  . Marital Status: Married    Spouse Name: N/A    Number of Children: N/A  . Years of Education: N/A   Occupational History  . Retired    Social History Main Topics  . Smoking status: Former Smoker -- 3.0 packs/day for 17 years    Types: Cigarettes    Quit date: 08/26/1977  . Smokeless tobacco: Not on file  . Alcohol Use: Not on file  . Drug Use: Not on file  . Sexually Active: Not on file   Other Topics Concern  . 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    ROS ALL NEGATIVE EXCEPT THOSE NOTED IN HPI  PE  General Appearance: well developed, well nourished in no acute distress HEENT: symmetrical face, PERRLA, good dentition  Neck: no JVD, thyromegaly, or adenopathy, trachea midline Chest: symmetric without deformity Cardiac: PMI non-displaced, RRR, normal S1, S2, no gallop or murmur Lung: clear to ausculation and percussion Vascular: all pulses full without bruits  Abdominal: nondistended, nontender, good bowel sounds, no HSM, no bruits Extremities: no cyanosis, clubbing or edema, no sign of DVT, no varicosities  Skin: normal color, no rashes Neuro: alert and oriented x 3, non-focal Pysch: normal affect  EKG Normal sinus rhythm, left axis deviation, no acute change. BMET    Component Value Date/Time   NA 138 01/17/2011 1022   K 5.0 01/17/2011 1022   CL 104 01/17/2011 1022   CO2 29 01/17/2011 1022   GLUCOSE 99 01/17/2011 1022   BUN 15 01/17/2011 1022   CREATININE 1.1 01/17/2011 1022   CALCIUM 9.2 01/17/2011 1022   GFRNONAA >60 08/20/2010 1040   GFRAA  Value: >60  The eGFR has been calculated using the MDRD equation. This calculation has not been validated in all clinical situations. eGFR's persistently <60 mL/min signify possible Chronic Kidney Disease. 08/20/2010 1040    Lipid Panel     Component Value Date/Time   CHOL 165 01/17/2011 1022   TRIG 40.0 01/17/2011 1022   HDL 67.40 01/17/2011 1022   CHOLHDL 2 01/17/2011 1022   VLDL 8.0 01/17/2011 1022   LDLCALC 90 01/17/2011 1022    CBC    Component Value Date/Time   WBC 6.1 08/20/2010 1040   RBC 4.39 08/20/2010 1040   HGB 13.3 08/20/2010 1040   HCT 40.2 08/20/2010 1040   PLT 162 08/20/2010 1040   MCV 91.6 08/20/2010 1040   MCH 30.3 08/20/2010 1040   MCHC 33.1 08/20/2010 1040   RDW 13.7 08/20/2010 1040

## 2012-02-07 ENCOUNTER — Encounter (INDEPENDENT_AMBULATORY_CARE_PROVIDER_SITE_OTHER): Payer: Medicare Other

## 2012-02-07 DIAGNOSIS — I6529 Occlusion and stenosis of unspecified carotid artery: Secondary | ICD-10-CM

## 2012-02-11 ENCOUNTER — Encounter: Payer: Self-pay | Admitting: Cardiology

## 2012-02-22 ENCOUNTER — Other Ambulatory Visit: Payer: Self-pay | Admitting: Pulmonary Disease

## 2012-05-19 ENCOUNTER — Encounter (INDEPENDENT_AMBULATORY_CARE_PROVIDER_SITE_OTHER): Payer: Medicare Other | Admitting: Ophthalmology

## 2012-05-19 DIAGNOSIS — H33309 Unspecified retinal break, unspecified eye: Secondary | ICD-10-CM

## 2012-05-19 DIAGNOSIS — H2510 Age-related nuclear cataract, unspecified eye: Secondary | ICD-10-CM

## 2012-05-19 DIAGNOSIS — H3530 Unspecified macular degeneration: Secondary | ICD-10-CM

## 2012-05-19 DIAGNOSIS — H43819 Vitreous degeneration, unspecified eye: Secondary | ICD-10-CM

## 2012-05-26 ENCOUNTER — Ambulatory Visit (INDEPENDENT_AMBULATORY_CARE_PROVIDER_SITE_OTHER): Payer: Medicare Other | Admitting: Ophthalmology

## 2012-05-26 DIAGNOSIS — H33309 Unspecified retinal break, unspecified eye: Secondary | ICD-10-CM

## 2012-07-31 ENCOUNTER — Ambulatory Visit (INDEPENDENT_AMBULATORY_CARE_PROVIDER_SITE_OTHER): Payer: Medicare Other | Admitting: Pulmonary Disease

## 2012-07-31 ENCOUNTER — Encounter: Payer: Self-pay | Admitting: Pulmonary Disease

## 2012-07-31 VITALS — BP 108/84 | HR 89 | Temp 98.1°F | Ht 70.0 in | Wt 202.2 lb

## 2012-07-31 DIAGNOSIS — J449 Chronic obstructive pulmonary disease, unspecified: Secondary | ICD-10-CM

## 2012-07-31 NOTE — Assessment & Plan Note (Signed)
The patient is doing very well from a COPD standpoint on as needed medications only.  I have asked him to try and stay as active as possible, and work on weight loss.  He will call us if he has increased rescue inhaler use.

## 2012-07-31 NOTE — Patient Instructions (Addendum)
Stay as active as possible, work on weight loss Let us know if you are requiring more frequent use of your rescue inhaler. followup with me in one year.

## 2012-07-31 NOTE — Progress Notes (Signed)
  Subjective:    Patient ID: Jeffrey Cobb, male    DOB: [birth year], 71 y.o.   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  He has discontinued his maintenance inhalers with resolution of his cough, and rarely uses his rescue inhaler.  He states he has used it 3 times only since our last visit.  He is trying to stay active as possible, and has had his flu shot this year.   Review of Systems  Constitutional: Negative for fever and unexpected weight change.  HENT: Positive for congestion. Negative for ear pain, nosebleeds, sore throat, rhinorrhea, sneezing, trouble swallowing, dental problem, postnasal drip and sinus pressure.   Eyes: Negative for redness and itching.  Respiratory: Positive for cough, chest tightness, shortness of breath and wheezing.   Cardiovascular: Negative for palpitations and leg swelling.  Gastrointestinal: Negative for nausea and vomiting.  Genitourinary: Negative for dysuria.  Musculoskeletal: Negative for joint swelling.  Skin: Negative for rash.  Neurological: Negative for headaches.  Hematological: Does not bruise/bleed easily.  Psychiatric/Behavioral: Negative for dysphoric mood. The patient is not nervous/anxious.        Objective:   Physical Exam Overweight male in no acute distress Nose without purulence or discharge noted Neck without lymphadenopathy or thyromegaly Chest with clear breath sounds bilaterally, no wheezing Cardiac exam with regular rate and rhythm Lower extremities without edema, cyanosis Alert and oriented, moves all 4 extremities.       Assessment & Plan:

## 2012-08-31 ENCOUNTER — Encounter (INDEPENDENT_AMBULATORY_CARE_PROVIDER_SITE_OTHER): Payer: Medicare Other | Admitting: Ophthalmology

## 2012-08-31 DIAGNOSIS — H3530 Unspecified macular degeneration: Secondary | ICD-10-CM

## 2012-08-31 DIAGNOSIS — H2510 Age-related nuclear cataract, unspecified eye: Secondary | ICD-10-CM

## 2012-08-31 DIAGNOSIS — H33309 Unspecified retinal break, unspecified eye: Secondary | ICD-10-CM

## 2012-08-31 DIAGNOSIS — H43819 Vitreous degeneration, unspecified eye: Secondary | ICD-10-CM

## 2012-09-16 ENCOUNTER — Telehealth: Payer: Self-pay | Admitting: Pulmonary Disease

## 2012-09-16 MED ORDER — PANTOPRAZOLE SODIUM 40 MG PO TBEC: 40.0000 mg | DELAYED_RELEASE_TABLET | Freq: Two times a day (BID) | ORAL | Status: DC

## 2012-09-16 NOTE — Telephone Encounter (Signed)
rx has been sent to the pharmacy. Nothing further was needed

## 2012-10-31 ENCOUNTER — Other Ambulatory Visit: Payer: Self-pay | Admitting: Pulmonary Disease

## 2012-12-16 ENCOUNTER — Encounter (INDEPENDENT_AMBULATORY_CARE_PROVIDER_SITE_OTHER): Payer: Medicare Other | Admitting: Ophthalmology

## 2012-12-16 DIAGNOSIS — H3530 Unspecified macular degeneration: Secondary | ICD-10-CM

## 2012-12-16 DIAGNOSIS — I10 Essential (primary) hypertension: Secondary | ICD-10-CM

## 2012-12-16 DIAGNOSIS — H43819 Vitreous degeneration, unspecified eye: Secondary | ICD-10-CM

## 2012-12-16 DIAGNOSIS — H2510 Age-related nuclear cataract, unspecified eye: Secondary | ICD-10-CM

## 2012-12-16 DIAGNOSIS — H35039 Hypertensive retinopathy, unspecified eye: Secondary | ICD-10-CM

## 2012-12-16 DIAGNOSIS — H33309 Unspecified retinal break, unspecified eye: Secondary | ICD-10-CM

## 2013-01-09 ENCOUNTER — Other Ambulatory Visit: Payer: Self-pay | Admitting: Neurology

## 2013-02-03 ENCOUNTER — Ambulatory Visit: Payer: Medicare Other | Admitting: Cardiology

## 2013-03-18 ENCOUNTER — Other Ambulatory Visit: Payer: Self-pay | Admitting: Gastroenterology

## 2013-04-14 ENCOUNTER — Other Ambulatory Visit: Payer: Self-pay | Admitting: *Deleted

## 2013-04-14 ENCOUNTER — Ambulatory Visit (INDEPENDENT_AMBULATORY_CARE_PROVIDER_SITE_OTHER): Payer: Medicare Other | Admitting: Cardiology

## 2013-04-14 ENCOUNTER — Encounter: Payer: Self-pay | Admitting: Cardiology

## 2013-04-14 DIAGNOSIS — I6529 Occlusion and stenosis of unspecified carotid artery: Secondary | ICD-10-CM

## 2013-04-14 DIAGNOSIS — E782 Mixed hyperlipidemia: Secondary | ICD-10-CM

## 2013-04-14 DIAGNOSIS — J449 Chronic obstructive pulmonary disease, unspecified: Secondary | ICD-10-CM

## 2013-04-14 MED ORDER — ASPIRIN EC 81 MG PO TBEC: 81.0000 mg | DELAYED_RELEASE_TABLET | Freq: Every day | ORAL | Status: DC

## 2013-04-14 NOTE — Patient Instructions (Signed)
Your physician wants you to follow-up in: 1 YEAR DR Johnell Comings will receive a reminder letter in the mail two months in advance. If you don't receive a letter, please call our office to schedule the follow-up appointment.   Your physician has requested that you have a carotid duplex DUE AROUND  6 /2015 . This test is an ultrasound of the carotid arteries in your neck. It looks at blood flow through these arteries that supply the brain with blood. Allow one hour for this exam. There are no restrictions or special instructions.

## 2013-04-14 NOTE — Assessment & Plan Note (Signed)
Stable clinically. He has been off his aspirin for surgery in November. I've asked to start back on 81 mg a day. Stop the aspirin 10 days before shoulder surgery in November. Repeat carotid Dopplers again next year. Carotid report reviewed with him.

## 2013-04-14 NOTE — Progress Notes (Signed)
HPI Jeffrey Cobb returns today for evaluation and management his carotid artery disease. Carotid Dopplers were stable in June. He denies symptoms of TIAs or mini strokes. Remarkably, he has been taken off his aspirin for surgery in November. He was taking 81 mg a day.  He said some vague chest discomfort that can last all day. 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  His lipids have always been good he's never been on a statin.  Past Medical History  Diagnosis Date  . Occlusion and stenosis of carotid artery without mention of cerebral infarction   . Obstructive chronic bronchitis with exacerbation   . Shortness of breath   . Other emphysema   . Mixed hyperlipidemia   . Persistent disorder of initiating or maintaining sleep   . Acute sinusitis, unspecified     Current Outpatient Prescriptions  Medication Sig Dispense Refill  . clonazePAM (KLONOPIN) 0.5 MG tablet At bedtime as needed.      . doxazosin (CARDURA) 2 MG tablet Take 1 tablet by mouth daily.      Marland Kitchen HYDROcodone-acetaminophen (NORCO/VICODIN) 5-325 MG per tablet       . LEVITRA 20 MG tablet prn      . pantoprazole (PROTONIX) 40 MG tablet Take 1 tablet (40 mg total) by mouth 2 (two) times daily.  60 tablet  6  . pramipexole (MIRAPEX) 0.5 MG tablet TAKE 2 TABLETS BY MOUTH EVERY DAY  180 tablet  2   No current facility-administered medications for this visit.    Allergies  Allergen Reactions  . Sulfonamide Derivatives Rash  . Trazodone And Nefazodone Rash and Other (See Comments)    Hallucinations     Family History  Problem Relation Age of Onset  . Emphysema Mother   . Heart disease Mother   . Heart disease Father   . Allergies      FH: FATHER,2 SISTER,1 BROTHER, SON  DAUGHTER    History   Social History  . Marital Status: Married    Spouse Name: N/A    Number of Children: N/A  . Years of Education: N/A   Occupational History  . Retired     Social History Main Topics  . Smoking status: Former Smoker -- 3.00 packs/day for 17 years    Types: Cigarettes    Quit date: 08/26/1977  . Smokeless tobacco: Not on file  . Alcohol Use: No  . Drug Use: Not on file  . Sexual Activity: Not on file   Other Topics Concern  . 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    ROS ALL NEGATIVE EXCEPT THOSE NOTED IN HPI  PE  General Appearance: well developed, well nourished in no acute distress HEENT: symmetrical face, PERRLA, good dentition  Neck: no JVD, thyromegaly, or adenopathy, trachea midline Chest: symmetric without deformity Cardiac: PMI non-displaced, RRR, normal S1, S2, no gallop or murmur Lung: clear to ausculation and percussion Vascular: all pulses full, no carotid bruits Abdominal: nondistended, nontender, good bowel sounds, no HSM, no bruits Extremities: no cyanosis, clubbing or edema, no sign of DVT, no varicosities  Skin: normal color, no rashes Neuro: alert and oriented x 3, non-focal Pysch: normal affect  EKG Sinus bradycardia, left axis deviation, no acute changes BMET    Component Value Date/Time   NA 138 01/17/2011 1022   K 5.0 01/17/2011 1022   CL 104  01/17/2011 1022   CO2 29 01/17/2011 1022   GLUCOSE 99 01/17/2011 1022   BUN 15 01/17/2011 1022   CREATININE 1.1 01/17/2011 1022   CALCIUM 9.2 01/17/2011 1022   GFRNONAA >60 08/20/2010 1040   GFRAA  Value: >60        The eGFR has been calculated using the MDRD equation. This calculation has not been validated in all clinical situations. eGFR's persistently <60 mL/min signify possible Chronic Kidney Disease. 08/20/2010 1040    Lipid Panel     Component Value Date/Time   CHOL 165 01/17/2011 1022   TRIG 40.0 01/17/2011 1022   HDL 67.40 01/17/2011 1022   CHOLHDL 2 01/17/2011 1022   VLDL 8.0 01/17/2011 1022   LDLCALC 90 01/17/2011 1022    CBC    Component Value Date/Time   WBC 6.1 08/20/2010  1040   RBC 4.39 08/20/2010 1040   HGB 13.3 08/20/2010 1040   HCT 40.2 08/20/2010 1040   PLT 162 08/20/2010 1040   MCV 91.6 08/20/2010 1040   MCH 30.3 08/20/2010 1040   MCHC 33.1 08/20/2010 1040   RDW 13.7 08/20/2010 1040

## 2013-06-12 ENCOUNTER — Other Ambulatory Visit: Payer: Self-pay | Admitting: Pulmonary Disease

## 2013-06-17 ENCOUNTER — Other Ambulatory Visit: Payer: Self-pay | Admitting: Orthopedic Surgery

## 2013-06-17 ENCOUNTER — Encounter (HOSPITAL_COMMUNITY): Payer: Self-pay | Admitting: Pharmacy Technician

## 2013-06-22 ENCOUNTER — Encounter (HOSPITAL_COMMUNITY)
Admission: RE | Admit: 2013-06-22 | Discharge: 2013-06-22 | Disposition: A | Discharge status: Home / Self Care | Payer: Medicare Other | Source: Ambulatory Visit | Attending: Cardiology | Admitting: Cardiology

## 2013-06-22 ENCOUNTER — Telehealth: Payer: Self-pay | Admitting: Pulmonary Disease

## 2013-06-22 ENCOUNTER — Encounter (HOSPITAL_COMMUNITY)
Admission: RE | Admit: 2013-06-22 | Discharge: 2013-06-22 | Disposition: A | Discharge status: Home / Self Care | Payer: Medicare Other | Source: Ambulatory Visit | Attending: Orthopedic Surgery | Admitting: Orthopedic Surgery

## 2013-06-22 ENCOUNTER — Encounter (HOSPITAL_COMMUNITY): Payer: Self-pay

## 2013-06-22 ENCOUNTER — Telehealth: Payer: Self-pay | Admitting: Cardiology

## 2013-06-22 DIAGNOSIS — Z01812 Encounter for preprocedural laboratory examination: Secondary | ICD-10-CM | POA: Insufficient documentation

## 2013-06-22 DIAGNOSIS — Z01818 Encounter for other preprocedural examination: Secondary | ICD-10-CM | POA: Insufficient documentation

## 2013-06-22 HISTORY — DX: Gastro-esophageal reflux disease without esophagitis: K21.9

## 2013-06-22 HISTORY — DX: Unspecified osteoarthritis, unspecified site: M19.90

## 2013-06-22 HISTORY — DX: Other complications of anesthesia, initial encounter: T88.59XA

## 2013-06-22 HISTORY — DX: Adverse effect of unspecified anesthetic, initial encounter: T41.45XA

## 2013-06-22 LAB — URINALYSIS, ROUTINE W REFLEX MICROSCOPIC
Bilirubin Urine: NEGATIVE
Glucose, UA: NEGATIVE mg/dL
Hgb urine dipstick: NEGATIVE
Leukocytes, UA: NEGATIVE
Protein, ur: NEGATIVE mg/dL
Specific Gravity, Urine: 1.014 (ref 1.005–1.030)
Urobilinogen, UA: 0.2 mg/dL (ref 0.0–1.0)

## 2013-06-22 LAB — CBC WITH DIFFERENTIAL/PLATELET
Eosinophils Absolute: 0.2 10*3/uL (ref 0.0–0.7)
Eosinophils Relative: 2 % (ref 0–5)
HCT: 43.8 % (ref 39.0–52.0)
Hemoglobin: 15.3 g/dL (ref 13.0–17.0)
Lymphs Abs: 2.3 10*3/uL (ref 0.7–4.0)
MCH: 31.9 pg (ref 26.0–34.0)
MCV: 91.4 fL (ref 78.0–100.0)
Monocytes Relative: 10 % (ref 3–12)
RBC: 4.79 MIL/uL (ref 4.22–5.81)

## 2013-06-22 LAB — COMPREHENSIVE METABOLIC PANEL
Alkaline Phosphatase: 82 U/L (ref 39–117)
BUN: 13 mg/dL (ref 6–23)
CO2: 23 mEq/L (ref 19–32)
Calcium: 9 mg/dL (ref 8.4–10.5)
Creatinine, Ser: 1.07 mg/dL (ref 0.50–1.35)
GFR calc Af Amer: 78 mL/min — ABNORMAL LOW (ref >90)
GFR calc non Af Amer: 67 mL/min — ABNORMAL LOW (ref >90)
Glucose, Bld: 88 mg/dL (ref 70–99)
Potassium: 4.3 mEq/L (ref 3.5–5.1)
Total Protein: 7.6 g/dL (ref 6.0–8.3)

## 2013-06-22 LAB — APTT: aPTT: 31 seconds (ref 24–37)

## 2013-06-22 LAB — PROTIME-INR: Prothrombin Time: 12.8 seconds (ref 11.6–15.2)

## 2013-06-22 NOTE — Telephone Encounter (Signed)
Sherri at Dr. Greig Right office requesting Cardiac Clearance for patient to have Total Shoulder Replacement in November. Please fax to: Attn: Sherri/Dr. Eulah Pont, fax [phone]. For questions, call Sherri at [phone].  Previous note from Dr. Daleen Squibb (August, 2014) states as follows:  Progress Notes    CAROTID ARTERY DISEASE - Gaylord Shih, MD at 04/14/2013 11:14 AM    Status: Written Related Problem: CAROTID ARTERY DISEASE         Stable clinically. He has been off his aspirin for surgery in November. I've asked to start back on 81 mg a day. Stop the aspirin 10 days before shoulder surgery in November. Repeat carotid Dopplers again next year. Carotid report reviewed with him.   Request routed to Dr. Delton See, as patient will be followed by Dr. Delton See now and recall for next appointment, August 2015.

## 2013-06-22 NOTE — Pre-Procedure Instructions (Signed)
BRAXDEN LOVERING  06/22/2013   Your procedure is scheduled on:  Wednesday, November 5th   Report to [facility]  2 * 3 at 6:30 AM.             [facility] Parking)  Call this number if you have problems the morning of surgery: [phone]   Remember:   Do not eat food or drink liquids after midnight Tuesday.   Take these medicines the morning of surgery with A SIP OF WATER: Flonase, Hydrocodone, Protonix   Do not wear jewelry--no rings or watches.  Do not wear lotions, powders, or colognes. You may wear deodorant.   Men may shave face and neck.   Do not bring valuables to the hospital.  [facility] is not responsible for any belongings or valuables.               Contacts, dentures or bridgework may not be worn into surgery.  Leave suitcase in the car. After surgery it may be brought to your room.  For patients admitted to the hospital, discharge time is determined by your treatment team.               Name and phone number of your driver:    Special Instructions: Shower using CHG 2 nights before surgery and the night before surgery.  If you shower the day of surgery use CHG.  Use special wash - you have one bottle of CHG for all showers.  You should use approximately 1/3 of the bottle for each shower.   Please read over the following fact sheets that you were given: Pain Booklet, Coughing and Deep Breathing, Blood Transfusion Information, MRSA Information and Surgical Site Infection Prevention

## 2013-06-22 NOTE — Telephone Encounter (Signed)
New problem   Pt having lt total shoulder replacement 06/30/13 and need cardiac clearance. Please advise

## 2013-06-22 NOTE — Telephone Encounter (Signed)
Dr Daleen Squibb saw the patient in August and stated that the patient was stable to undergo his shoulder surgery.  His only request was to continue taking aspirin 81 mg po daily until 10 days prior to the surgery when he should stop using it.   Tobias Alexander, Rexene Edison 06/22/2013

## 2013-06-22 NOTE — Telephone Encounter (Signed)
Cardiac Clearance for shoulder surgery with instructions rec'd from Dr. Delton See. Faxed and called to Sherri/Dr. Murphy's office at 4:15pm on 06/22/13.

## 2013-06-22 NOTE — Telephone Encounter (Signed)
I had to [facility] x1 for sherry. Do not see anything in EPIC regarding clearance for pt. What type of surgery? Can she refax form? When is his surgery?

## 2013-06-23 LAB — URINE CULTURE
Colony Count: NO GROWTH
Culture: NO GROWTH

## 2013-06-23 LAB — TYPE AND SCREEN
ABO/RH(D): O POS
Antibody Screen: NEGATIVE

## 2013-06-23 NOTE — Telephone Encounter (Signed)
I spoke with Cordelia Pen and she states the pt is schedule for total shoulder replacement on 06-30-13. I advised that we do not have the paperwork, and that the pt has not been seen since 07-2012 so he may need an appt so surgery may have to be postponed. I also advised that KC is not back in office until 06-28-13 so I will send him a message along with the form to address at that time. I have asked that she fax the form to triage fax. Will await fax. Carron Curie, CMA

## 2013-06-24 ENCOUNTER — Ambulatory Visit (HOSPITAL_BASED_OUTPATIENT_CLINIC_OR_DEPARTMENT_OTHER): Payer: Medicare Other

## 2013-06-24 DIAGNOSIS — Z01818 Encounter for other preprocedural examination: Secondary | ICD-10-CM | POA: Insufficient documentation

## 2013-06-24 DIAGNOSIS — Z01812 Encounter for preprocedural laboratory examination: Secondary | ICD-10-CM | POA: Insufficient documentation

## 2013-06-24 DIAGNOSIS — I6529 Occlusion and stenosis of unspecified carotid artery: Secondary | ICD-10-CM

## 2013-06-24 NOTE — Telephone Encounter (Signed)
Form received and given to ashtyn. Carron Curie, CMA

## 2013-06-24 NOTE — Telephone Encounter (Signed)
Noted. Will get to [facility] as soon as he returns. Pt scheduled for sx 06/30/13.   Dr Shelle Iron please see form in look at folder. Thanks.

## 2013-06-25 ENCOUNTER — Telehealth: Payer: Self-pay | Admitting: Cardiology

## 2013-06-25 NOTE — Telephone Encounter (Signed)
New message   Returning a nurses call regarding surgical clearance

## 2013-06-25 NOTE — Telephone Encounter (Signed)
Pt aware his surgical clearance has been sent to Dr. Eulah Pont. Pt will call Dr. Shelle Iron office to inquire if they called him for appointment Mylo Red RN

## 2013-06-25 NOTE — Telephone Encounter (Signed)
Follow UP:  Pt states he is returning a nurse's call regarding surgical clearance. Pt states he is already scheduled for surgery on Wednesday. Pt states he wants to know why he needs to see someone for surgical clearance.. Please advise

## 2013-06-28 ENCOUNTER — Other Ambulatory Visit: Payer: Self-pay | Admitting: Orthopedic Surgery

## 2013-06-28 NOTE — Telephone Encounter (Signed)
Calling back about surgery clearance.  Please advise.  [phone]

## 2013-06-28 NOTE — Telephone Encounter (Signed)
Will send to [facility] to address.

## 2013-06-28 NOTE — Telephone Encounter (Signed)
I do not see a form in my folders.

## 2013-06-28 NOTE — H&P (Signed)
  HPI: [name] is a 72 year old  male with a history of left shoulder degenerative joint disease and chronic pain who returns for follow-up.  Symptoms are unchanged from previous visits and he wishes to proceed with left total shoulder replacement as scheduled.  He has significant pain with activities affecting his quality of life and ability to perform activities of daily living.  They have failed to respond to conservative treatment options.  Current medications include Clonazepam 0.5 mg. q.h.s., Doxazosin 2 mg. q.d., Pramipexole 0.5 mg. b.i.d., Protonix 40 mg. q.d., aspirin 81 mg. q.d., multivitamins q.d., Sertraline 50 mg. q.d., Hydrocodone 5/325 1-2 q.4-6h  p.r.n., Fluticasone Propionate 2 sprays each nares q.d.    Allergies: sulfa medications which cause a rash.  Past Medical History positive for chronic obstructive lung disease, hemorrhoids, anxiety, cataracts, left carotid artery stenosis and GERD.   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    Family History is positive for heart attack and cancer.   Social History:  The patient is married and lives at home with his wife.  He is a former smoker.  He quit 20 years ago.  He does not drink alcohol.  Review of Systems: The patient currently denies lightheadedness, dizziness, fevers, chills cardiac, pulmonary, GU or GI, or neurological issues.    EXAMINATION: Ht. 5'10", wt. 200 pounds.  T 98.3, blood pressure 137/88, P 76.  Alert and oriented x 3 and in no acute distress.  Head is normocephalic and atraumatic.  PERRLA and EOM's intact.  Neck: unremarkable.  Lungs: clear to auscultation bilaterally.  No wheezes, rales or rhonchi noted.  Heart: regular rate and rhythm with no murmurs.  Abdomen soft nontender, normoactive bowel sound x 4.  Calves nontender bilaterally.  The patient is neurovascularly intact  bilateral upper and lower extremities.  Skin is warm and dry. Examination of the left shoulder, he is lacking about 25% of motion. All the motion he has is extremely painful.  Rotator cuff is intact.     X-RAYS: Previous imaging of the left shoulder demonstrates severe end-stage degenerative joint changes.    IMPRESSION: Left shoulder end-stage degenerative joint changes and chronic pain which has failed to respond to conservative treatment.    DISPOSITION: Patient was educated on their diagnosis and typical treatment algorithm.    Will proceed with left total shoulder replacement as scheduled.  Discussed risks, benefits and possible complications of surgery, rehab and recovery time discuss.  All questions were answered.  The patient will begin outpatient physical therapy after discharge from the hospital.  Will use aspirin 325 mg. for deep venous thrombosis  prophylaxis while the patient is in the hospital.    Instructed patient to call with questions and/or concerns. Questions were encouraged and answered.

## 2013-06-29 MED ORDER — CEFAZOLIN SODIUM-DEXTROSE 2-3 GM-% IV SOLR
2.0000 g | INTRAVENOUS | Status: AC
  Administered 2013-06-30: 2 g via INTRAVENOUS
  Filled 2013-06-29: qty 50

## 2013-06-29 NOTE — Telephone Encounter (Signed)
Surgical Clearance form on your desk.

## 2013-06-29 NOTE — Telephone Encounter (Signed)
Surgical clearance complete-- faxed back to [phone] ATTN: Sherri

## 2013-06-29 NOTE — Telephone Encounter (Signed)
done

## 2013-06-30 ENCOUNTER — Inpatient Hospital Stay (HOSPITAL_COMMUNITY): Payer: Medicare Other | Admitting: Certified Registered Nurse Anesthetist

## 2013-06-30 ENCOUNTER — Inpatient Hospital Stay (HOSPITAL_COMMUNITY): Payer: Medicare Other

## 2013-06-30 ENCOUNTER — Encounter (HOSPITAL_COMMUNITY)
Admission: RE | Disposition: A | Discharge status: Home / Self Care | Payer: Self-pay | Source: Ambulatory Visit | Attending: Orthopedic Surgery

## 2013-06-30 ENCOUNTER — Encounter (HOSPITAL_COMMUNITY): Payer: Medicare Other | Admitting: Certified Registered Nurse Anesthetist

## 2013-06-30 ENCOUNTER — Encounter (HOSPITAL_COMMUNITY): Payer: Self-pay | Admitting: *Deleted

## 2013-06-30 ENCOUNTER — Inpatient Hospital Stay (HOSPITAL_COMMUNITY)
Admission: RE | Admit: 2013-06-30 | Discharge: 2013-07-01 | DRG: 483 | Disposition: A | Discharge status: Home / Self Care | Payer: Medicare Other | Source: Ambulatory Visit | Attending: Orthopedic Surgery | Admitting: Orthopedic Surgery

## 2013-06-30 DIAGNOSIS — M19012 Primary osteoarthritis, left shoulder: Secondary | ICD-10-CM

## 2013-06-30 DIAGNOSIS — Z7982 Long term (current) use of aspirin: Secondary | ICD-10-CM

## 2013-06-30 DIAGNOSIS — Z96659 Presence of unspecified artificial knee joint: Secondary | ICD-10-CM

## 2013-06-30 DIAGNOSIS — Z8249 Family history of ischemic heart disease and other diseases of the circulatory system: Secondary | ICD-10-CM

## 2013-06-30 DIAGNOSIS — Z96619 Presence of unspecified artificial shoulder joint: Secondary | ICD-10-CM

## 2013-06-30 DIAGNOSIS — K219 Gastro-esophageal reflux disease without esophagitis: Secondary | ICD-10-CM | POA: Diagnosis present

## 2013-06-30 DIAGNOSIS — Z87891 Personal history of nicotine dependence: Secondary | ICD-10-CM

## 2013-06-30 DIAGNOSIS — E782 Mixed hyperlipidemia: Secondary | ICD-10-CM | POA: Diagnosis present

## 2013-06-30 DIAGNOSIS — I739 Peripheral vascular disease, unspecified: Secondary | ICD-10-CM | POA: Diagnosis present

## 2013-06-30 DIAGNOSIS — F411 Generalized anxiety disorder: Secondary | ICD-10-CM | POA: Diagnosis present

## 2013-06-30 DIAGNOSIS — M19019 Primary osteoarthritis, unspecified shoulder: Principal | ICD-10-CM | POA: Diagnosis present

## 2013-06-30 DIAGNOSIS — J438 Other emphysema: Secondary | ICD-10-CM | POA: Diagnosis present

## 2013-06-30 HISTORY — PX: TOTAL SHOULDER ARTHROPLASTY: SHX126

## 2013-06-30 LAB — CBC
HCT: 38.4 % — ABNORMAL LOW (ref 39.0–52.0)
Hemoglobin: 13.2 g/dL (ref 13.0–17.0)
MCH: 31.5 pg (ref 26.0–34.0)
MCHC: 34.4 g/dL (ref 30.0–36.0)
MCV: 91.6 fL (ref 78.0–100.0)
Platelets: 126 10*3/uL — ABNORMAL LOW (ref 150–400)
RDW: 13.7 % (ref 11.5–15.5)
WBC: 10.3 10*3/uL (ref 4.0–10.5)

## 2013-06-30 LAB — CREATININE, SERUM
GFR calc Af Amer: 85 mL/min — ABNORMAL LOW (ref >90)
GFR calc non Af Amer: 73 mL/min — ABNORMAL LOW (ref >90)

## 2013-06-30 SURGERY — ARTHROPLASTY, SHOULDER, TOTAL
Anesthesia: General | Laterality: Left | Wound class: Clean

## 2013-06-30 MED ORDER — ASPIRIN EC 81 MG PO TBEC
81.0000 mg | DELAYED_RELEASE_TABLET | Freq: Every day | ORAL | Status: DC
Start: 2013-07-01 — End: 2013-07-01
  Filled 2013-06-30: qty 1

## 2013-06-30 MED ORDER — HYDROMORPHONE HCL PF 1 MG/ML IJ SOLN: 0.5000 mg | INTRAMUSCULAR | Status: DC | PRN

## 2013-06-30 MED ORDER — NEOSTIGMINE METHYLSULFATE 1 MG/ML IJ SOLN
INTRAMUSCULAR | Status: DC | PRN
  Administered 2013-06-30: 4 mg via INTRAVENOUS

## 2013-06-30 MED ORDER — POTASSIUM CHLORIDE IN NACL 20-0.9 MEQ/L-% IV SOLN
INTRAVENOUS | Status: DC
  Administered 2013-06-30: 23:00:00 via INTRAVENOUS
  Filled 2013-06-30 (×6): qty 1000

## 2013-06-30 MED ORDER — PROPOFOL 10 MG/ML IV BOLUS: INTRAVENOUS | Status: DC | PRN

## 2013-06-30 MED ORDER — METOCLOPRAMIDE HCL 5 MG/ML IJ SOLN: 5.0000 mg | Freq: Three times a day (TID) | INTRAMUSCULAR | Status: DC | PRN

## 2013-06-30 MED ORDER — METHOCARBAMOL 500 MG PO TABS
500.0000 mg | ORAL_TABLET | Freq: Four times a day (QID) | ORAL | Status: DC | PRN
  Administered 2013-06-30 – 2013-07-01 (×2): 500 mg via ORAL
  Filled 2013-06-30 (×3): qty 1

## 2013-06-30 MED ORDER — MUPIROCIN 2 % EX OINT
TOPICAL_OINTMENT | CUTANEOUS | Status: AC
  Filled 2013-06-30: qty 22

## 2013-06-30 MED ORDER — SODIUM CHLORIDE 0.9 % IV SOLN
10.0000 mg | INTRAVENOUS | Status: DC | PRN
  Administered 2013-06-30: 80 ug/min via INTRAVENOUS

## 2013-06-30 MED ORDER — OXYCODONE-ACETAMINOPHEN 5-325 MG PO TABS
1.0000 | ORAL_TABLET | ORAL | Status: DC | PRN
  Administered 2013-06-30 – 2013-07-01 (×3): 2 via ORAL
  Filled 2013-06-30 (×3): qty 2

## 2013-06-30 MED ORDER — FENTANYL CITRATE 0.05 MG/ML IJ SOLN
INTRAMUSCULAR | Status: DC | PRN
  Administered 2013-06-30 (×2): 50 ug via INTRAVENOUS

## 2013-06-30 MED ORDER — MUPIROCIN 2 % EX OINT
TOPICAL_OINTMENT | Freq: Two times a day (BID) | CUTANEOUS | Status: DC
  Administered 2013-06-30 (×2): via NASAL

## 2013-06-30 MED ORDER — FLUTICASONE PROPIONATE 50 MCG/ACT NA SUSP
2.0000 | Freq: Every day | NASAL | Status: DC | PRN
  Filled 2013-06-30: qty 16

## 2013-06-30 MED ORDER — MENTHOL 3 MG MT LOZG: 1.0000 | LOZENGE | OROMUCOSAL | Status: DC | PRN

## 2013-06-30 MED ORDER — SERTRALINE HCL 50 MG PO TABS
50.0000 mg | ORAL_TABLET | Freq: Every day | ORAL | Status: DC
  Administered 2013-06-30: 50 mg via ORAL
  Filled 2013-06-30 (×2): qty 1

## 2013-06-30 MED ORDER — LACTATED RINGERS IV SOLN: INTRAVENOUS | Status: DC

## 2013-06-30 MED ORDER — PROPOFOL 10 MG/ML IV BOLUS
INTRAVENOUS | Status: DC | PRN
  Administered 2013-06-30: 150 mg via INTRAVENOUS

## 2013-06-30 MED ORDER — LACTATED RINGERS IV SOLN
INTRAVENOUS | Status: DC | PRN
  Administered 2013-06-30 (×2): via INTRAVENOUS

## 2013-06-30 MED ORDER — EPHEDRINE SULFATE 50 MG/ML IJ SOLN
INTRAMUSCULAR | Status: DC | PRN
  Administered 2013-06-30: 10 mg via INTRAVENOUS
  Administered 2013-06-30: 15 mg via INTRAVENOUS

## 2013-06-30 MED ORDER — GLYCOPYRROLATE 0.2 MG/ML IJ SOLN
INTRAMUSCULAR | Status: DC | PRN
  Administered 2013-06-30: .6 mg via INTRAVENOUS

## 2013-06-30 MED ORDER — ENOXAPARIN SODIUM 40 MG/0.4ML ~~LOC~~ SOLN
40.0000 mg | SUBCUTANEOUS | Status: DC
  Administered 2013-06-30: 40 mg via SUBCUTANEOUS
  Filled 2013-06-30 (×2): qty 0.4

## 2013-06-30 MED ORDER — ACETAMINOPHEN 325 MG PO TABS
650.0000 mg | ORAL_TABLET | Freq: Four times a day (QID) | ORAL | Status: DC | PRN
  Administered 2013-07-01: 650 mg via ORAL
  Filled 2013-06-30: qty 2

## 2013-06-30 MED ORDER — SODIUM CHLORIDE 0.9 % IR SOLN
Status: DC | PRN
  Administered 2013-06-30: 1000 mL

## 2013-06-30 MED ORDER — ROCURONIUM BROMIDE 100 MG/10ML IV SOLN
INTRAVENOUS | Status: DC | PRN
  Administered 2013-06-30: 50 mg via INTRAVENOUS

## 2013-06-30 MED ORDER — DOXAZOSIN MESYLATE 2 MG PO TABS
2.0000 mg | ORAL_TABLET | Freq: Every day | ORAL | Status: DC
  Filled 2013-06-30: qty 1

## 2013-06-30 MED ORDER — DOCUSATE SODIUM 100 MG PO CAPS
100.0000 mg | ORAL_CAPSULE | Freq: Two times a day (BID) | ORAL | Status: DC
  Administered 2013-06-30: 100 mg via ORAL
  Filled 2013-06-30 (×3): qty 1

## 2013-06-30 MED ORDER — PANTOPRAZOLE SODIUM 40 MG PO TBEC
40.0000 mg | DELAYED_RELEASE_TABLET | Freq: Two times a day (BID) | ORAL | Status: DC
  Administered 2013-06-30 – 2013-07-01 (×2): 40 mg via ORAL
  Filled 2013-06-30 (×2): qty 1

## 2013-06-30 MED ORDER — METHOCARBAMOL 100 MG/ML IJ SOLN
500.0000 mg | Freq: Four times a day (QID) | INTRAVENOUS | Status: DC | PRN
  Filled 2013-06-30: qty 5

## 2013-06-30 MED ORDER — CEFAZOLIN SODIUM 1-5 GM-% IV SOLN
1.0000 g | Freq: Four times a day (QID) | INTRAVENOUS | Status: AC
  Administered 2013-06-30 – 2013-07-01 (×3): 1 g via INTRAVENOUS
  Filled 2013-06-30 (×3): qty 50

## 2013-06-30 MED ORDER — PHENYLEPHRINE HCL 10 MG/ML IJ SOLN
INTRAMUSCULAR | Status: DC | PRN
  Administered 2013-06-30: 120 ug via INTRAVENOUS
  Administered 2013-06-30: 80 ug via INTRAVENOUS
  Administered 2013-06-30: 40 ug via INTRAVENOUS

## 2013-06-30 MED ORDER — PRAMIPEXOLE DIHYDROCHLORIDE 1 MG PO TABS
1.0000 mg | ORAL_TABLET | Freq: Every day | ORAL | Status: DC
  Administered 2013-06-30: 1 mg via ORAL
  Filled 2013-06-30 (×2): qty 1

## 2013-06-30 MED ORDER — ONDANSETRON HCL 4 MG PO TABS: 4.0000 mg | ORAL_TABLET | Freq: Four times a day (QID) | ORAL | Status: DC | PRN

## 2013-06-30 MED ORDER — PHENOL 1.4 % MT LIQD: 1.0000 | OROMUCOSAL | Status: DC | PRN

## 2013-06-30 MED ORDER — LIDOCAINE HCL (CARDIAC) 20 MG/ML IV SOLN
INTRAVENOUS | Status: DC | PRN
  Administered 2013-06-30: 60 mg via INTRAVENOUS

## 2013-06-30 MED ORDER — ONDANSETRON HCL 4 MG/2ML IJ SOLN: 4.0000 mg | Freq: Four times a day (QID) | INTRAMUSCULAR | Status: DC | PRN

## 2013-06-30 MED ORDER — CLONAZEPAM 0.5 MG PO TABS
0.5000 mg | ORAL_TABLET | Freq: Every day | ORAL | Status: DC
  Administered 2013-06-30: 0.5 mg via ORAL
  Filled 2013-06-30: qty 1

## 2013-06-30 MED ORDER — BUPIVACAINE-EPINEPHRINE PF 0.5-1:200000 % IJ SOLN
INTRAMUSCULAR | Status: DC | PRN
  Administered 2013-06-30: 30 mL

## 2013-06-30 MED ORDER — MIDAZOLAM HCL 5 MG/5ML IJ SOLN
INTRAMUSCULAR | Status: DC | PRN
  Administered 2013-06-30 (×2): 1 mg via INTRAVENOUS

## 2013-06-30 MED ORDER — ONDANSETRON HCL 4 MG/2ML IJ SOLN
INTRAMUSCULAR | Status: DC | PRN
  Administered 2013-06-30: 4 mg via INTRAVENOUS

## 2013-06-30 MED ORDER — ACETAMINOPHEN 650 MG RE SUPP: 650.0000 mg | Freq: Four times a day (QID) | RECTAL | Status: DC | PRN

## 2013-06-30 MED ORDER — METOCLOPRAMIDE HCL 10 MG PO TABS: 5.0000 mg | ORAL_TABLET | Freq: Three times a day (TID) | ORAL | Status: DC | PRN

## 2013-06-30 SURGICAL SUPPLY — 69 items
APL SKNCLS STERI-STRIP NONHPOA (GAUZE/BANDAGES/DRESSINGS) ×1
BANDAGE GAUZE ELAST BULKY 4 IN (GAUZE/BANDAGES/DRESSINGS) ×2 IMPLANT
BENZOIN TINCTURE PRP APPL 2/3 (GAUZE/BANDAGES/DRESSINGS) ×2 IMPLANT
BLADE SAW SGTL 83.5X18.5 (BLADE) ×2 IMPLANT
BOOTCOVER CLEANROOM LRG (PROTECTIVE WEAR) ×4 IMPLANT
BOWL SMART MIX CTS (DISPOSABLE) IMPLANT
BRUSH FEMORAL CANAL (MISCELLANEOUS) IMPLANT
BUR SURG 4X8 MED (BURR) IMPLANT
BURR SURG 4X8 MED (BURR)
CEMENT BONE SIMPLEX SPEEDSET (Cement) ×2 IMPLANT
CLOTH BEACON ORANGE TIMEOUT ST (SAFETY) ×2 IMPLANT
COVER MAYO STAND STRL (DRAPES) ×4 IMPLANT
COVER SURGICAL LIGHT HANDLE (MISCELLANEOUS) ×2 IMPLANT
DRAPE C-ARM 42X72 X-RAY (DRAPES) IMPLANT
DRAPE INCISE IOBAN 66X45 STRL (DRAPES) ×2 IMPLANT
DRAPE ORTHO SPLIT 77X108 STRL (DRAPES) ×4
DRAPE SURG ORHT 6 SPLT 77X108 (DRAPES) IMPLANT
DRAPE U-SHAPE 47X51 STRL (DRAPES) ×2 IMPLANT
DRSG MEPILEX BORDER 4X8 (GAUZE/BANDAGES/DRESSINGS) ×1 IMPLANT
DRSG PAD ABDOMINAL 8X10 ST (GAUZE/BANDAGES/DRESSINGS) ×4 IMPLANT
DURAPREP 26ML APPLICATOR (WOUND CARE) ×2 IMPLANT
ELECT CAUTERY BLADE 6.4 (BLADE) ×2 IMPLANT
ELECT REM PT RETURN 9FT ADLT (ELECTROSURGICAL) ×2
ELECTRODE REM PT RTRN 9FT ADLT (ELECTROSURGICAL) ×1 IMPLANT
EVACUATOR 1/8 PVC DRAIN (DRAIN) IMPLANT
FACESHIELD LNG OPTICON STERILE (SAFETY) ×4 IMPLANT
GAUZE XEROFORM 5X9 LF (GAUZE/BANDAGES/DRESSINGS) ×2 IMPLANT
GLENOID SELF-PRESSURIZING SHLD (Shoulder) ×1 IMPLANT
GLOVE BIO SURGEON STRL SZ8 (GLOVE) ×2 IMPLANT
GLOVE BIOGEL PI IND STRL 7.5 (GLOVE) IMPLANT
GLOVE BIOGEL PI IND STRL 8 (GLOVE) ×1 IMPLANT
GLOVE BIOGEL PI INDICATOR 7.5 (GLOVE) ×2
GLOVE BIOGEL PI INDICATOR 8 (GLOVE) ×1
GLOVE ORTHO TXT STRL SZ7.5 (GLOVE) ×4 IMPLANT
GOWN PREVENTION PLUS XLARGE (GOWN DISPOSABLE) ×2 IMPLANT
GOWN PREVENTION PLUS XXLARGE (GOWN DISPOSABLE) ×2 IMPLANT
GOWN STRL NON-REIN LRG LVL3 (GOWN DISPOSABLE) ×4 IMPLANT
HANDPIECE INTERPULSE COAX TIP (DISPOSABLE)
HEAD HUMERAL SNGL RADIUS 21MM (Head) ×1 IMPLANT
KIT BASIN OR (CUSTOM PROCEDURE TRAY) ×2 IMPLANT
KIT ROOM TURNOVER OR (KITS) ×2 IMPLANT
MANIFOLD NEPTUNE II (INSTRUMENTS) ×2 IMPLANT
NDL 1/2 CIR CATGUT .05X1.09 (NEEDLE) ×1 IMPLANT
NDL HYPO 25GX1X1/2 BEV (NEEDLE) ×1 IMPLANT
NEEDLE 1/2 CIR CATGUT .05X1.09 (NEEDLE) ×2 IMPLANT
NEEDLE HYPO 25GX1X1/2 BEV (NEEDLE) ×2 IMPLANT
NS IRRIG 1000ML POUR BTL (IV SOLUTION) ×2 IMPLANT
PACK SHOULDER (CUSTOM PROCEDURE TRAY) ×2 IMPLANT
PAD ARMBOARD 7.5X6 YLW CONV (MISCELLANEOUS) ×4 IMPLANT
PASSER SUT SWANSON 36MM LOOP (INSTRUMENTS) IMPLANT
SET HNDPC FAN SPRY TIP SCT (DISPOSABLE) IMPLANT
SPONGE GAUZE 4X4 12PLY (GAUZE/BANDAGES/DRESSINGS) ×2 IMPLANT
SPONGE LAP 18X18 X RAY DECT (DISPOSABLE) ×2 IMPLANT
STEM HUMERAL REUNION TSA 128MM (Stem) ×1 IMPLANT
STRIP CLOSURE SKIN 1/2X4 (GAUZE/BANDAGES/DRESSINGS) ×2 IMPLANT
SUCTION FRAZIER TIP 10 FR DISP (SUCTIONS) ×2 IMPLANT
SUT FIBERWIRE #2 38 T-5 BLUE (SUTURE) ×4
SUT VIC AB 2-0 CT1 27 (SUTURE) ×2
SUT VIC AB 2-0 CT1 TAPERPNT 27 (SUTURE) ×1 IMPLANT
SUTURE FIBERWR #2 38 T-5 BLUE (SUTURE) ×2 IMPLANT
SWABSTICK BENZOIN STERILE (MISCELLANEOUS) ×2 IMPLANT
SYR CONTROL 10ML LL (SYRINGE) ×2 IMPLANT
SYR TOOMEY 50ML (SYRINGE) IMPLANT
TAPE STRIPS DRAPE STRL (GAUZE/BANDAGES/DRESSINGS) ×1 IMPLANT
TOWEL OR 17X24 6PK STRL BLUE (TOWEL DISPOSABLE) ×2 IMPLANT
TOWEL OR 17X26 10 PK STRL BLUE (TOWEL DISPOSABLE) ×2 IMPLANT
TOWER CARTRIDGE SMART MIX (DISPOSABLE) IMPLANT
TRAY FOLEY CATH 16FRSI W/METER (SET/KITS/TRAYS/PACK) IMPLANT
WATER STERILE IRR 1000ML POUR (IV SOLUTION) ×2 IMPLANT

## 2013-06-30 NOTE — Anesthesia Postprocedure Evaluation (Signed)
  Anesthesia Post-op Note  Patient: Jeffrey Cobb  Procedure(s) Performed: Procedure(s): TOTAL SHOULDER ARTHROPLASTY (Left)  Patient Location: PACU  Anesthesia Type:GA combined with regional for post-op pain  Level of Consciousness: awake  Airway and Oxygen Therapy: Patient Spontanous Breathing  Post-op Pain: none  Post-op Assessment: Post-op Vital signs reviewed  Post-op Vital Signs: stable  Complications: No apparent anesthesia complications

## 2013-06-30 NOTE — Interval H&P Note (Signed)
History and Physical Interval Note:  06/30/2013 8:35 AM  [name]  has presented today for surgery, with the diagnosis of DJD LEFT SHOULDER  The various methods of treatment have been discussed with the patient and family. After consideration of risks, benefits and other options for treatment, the patient has consented to  Procedure(s): TOTAL SHOULDER ARTHROPLASTY (Left) as a surgical intervention .  The patient's history has been reviewed, patient examined, no change in status, stable for surgery.  I have reviewed the patient's chart and labs.  Questions were answered to the patient's satisfaction.     Jeffrey Cobb F

## 2013-06-30 NOTE — Transfer of Care (Signed)
Immediate Anesthesia Transfer of Care Note  Patient: Jeffrey Cobb  Procedure(s) Performed: Procedure(s): TOTAL SHOULDER ARTHROPLASTY (Left)  Patient Location: PACU  Anesthesia Type:General  Level of Consciousness: awake, alert , oriented and patient cooperative  Airway & Oxygen Therapy: Patient Spontanous Breathing and Patient connected to nasal cannula oxygen  Post-op Assessment: Report given to PACU RN, Post -op Vital signs reviewed and stable and Patient moving all extremities  Post vital signs: Reviewed and stable  Complications: No apparent anesthesia complications

## 2013-06-30 NOTE — Anesthesia Preprocedure Evaluation (Addendum)
Anesthesia Evaluation  Patient identified by MRN, date of birth, ID band Patient awake    Reviewed: Allergy & Precautions, H&P , NPO status , Patient's Chart, lab work & pertinent test results  History of Anesthesia Complications (+) history of anesthetic complications  Airway Mallampati: II TM Distance: >3 FB Neck ROM: Full    Dental  (+) Teeth Intact   Pulmonary COPDformer smoker,  breath sounds clear to auscultation        Cardiovascular + Peripheral Vascular Disease Rhythm:Regular Rate:Normal     Neuro/Psych    GI/Hepatic GERD-  Medicated and Controlled,  Endo/Other    Renal/GU      Musculoskeletal   Abdominal   Peds  Hematology   Anesthesia Other Findings Difficulty sleeping with each surgery requiring a new med for sleep each time     Reproductive/Obstetrics                         Anesthesia Physical Anesthesia Plan  ASA: II  Anesthesia Plan: General   Post-op Pain Management:    Induction: Intravenous  Airway Management Planned: Oral ETT  Additional Equipment:   Intra-op Plan:   Post-operative Plan: Extubation in OR  Informed Consent: I have reviewed the patients History and Physical, chart, labs and discussed the procedure including the risks, benefits and alternatives for the proposed anesthesia with the patient or authorized representative who has indicated his/her understanding and acceptance.   Dental advisory given  Plan Discussed with: CRNA, Surgeon and Anesthesiologist  Anesthesia Plan Comments:        Anesthesia Quick Evaluation

## 2013-06-30 NOTE — Anesthesia Procedure Notes (Addendum)
Anesthesia Regional Block:  Supraclavicular block  Pre-Anesthetic Checklist: ,, timeout performed, Correct Patient, Correct Site, Correct Laterality, Correct Procedure, Correct Position, site marked, Risks and benefits discussed,  Surgical consent,  Pre-op evaluation,  At surgeon's request and post-op pain management  Laterality: Left and Upper  Prep: chloraprep       Needles:   Needle Type: Echogenic Needle      Needle Gauge: 22 and 22 G  Needle insertion depth: 5 cm   Additional Needles:  Procedures: ultrasound guided (picture in chart) and nerve stimulator Supraclavicular block Narrative:  Start time: 06/30/2013 7:55 AM End time: 06/30/2013 8:10 AM Injection made incrementally with aspirations every 5 mL.  Performed by: Personally  Anesthesiologist: T Massagee  Additional Notes: Monitors on.Sedation begun. Tolerated well.   Procedure Name: Intubation Date/Time: 06/30/2013 8:48 AM Performed by: Rogelia Boga Pre-anesthesia Checklist: Patient identified, Emergency Drugs available, Suction available, Patient being monitored and Timeout performed Patient Re-evaluated:Patient Re-evaluated prior to inductionOxygen Delivery Method: Circle system utilized Preoxygenation: Pre-oxygenation with 100% oxygen Intubation Type: IV induction Ventilation: Mask ventilation without difficulty Laryngoscope Size: Mac and 4 Grade View: Grade II Tube type: Oral Tube size: 7.5 mm Number of attempts: 1 Airway Equipment and Method: Stylet Placement Confirmation: ETT inserted through vocal cords under direct vision,  positive ETCO2 and breath sounds checked- equal and bilateral Secured at: 23 cm Tube secured with: Tape Dental Injury: Teeth and Oropharynx as per pre-operative assessment

## 2013-06-30 NOTE — Preoperative (Signed)
Beta Blockers   Reason not to administer Beta Blockers:Not Applicable 

## 2013-06-30 NOTE — H&P (View-Only) (Signed)
  HPI: Jeffrey Cobb is a 72-year-old  male with a history of left shoulder degenerative joint disease and chronic pain who returns for follow-up.  Symptoms are unchanged from previous visits and he wishes to proceed with left total shoulder replacement as scheduled.  He has significant pain with activities affecting his quality of life and ability to perform activities of daily living.  They have failed to respond to conservative treatment options.  Current medications include Clonazepam 0.5 mg. q.h.s., Doxazosin 2 mg. q.d., Pramipexole 0.5 mg. b.i.d., Protonix 40 mg. q.d., aspirin 81 mg. q.d., multivitamins q.d., Sertraline 50 mg. q.d., Hydrocodone 5/325 1-2 q.4-6h  p.r.n., Fluticasone Propionate 2 sprays each nares q.d.    Allergies: sulfa medications which cause a rash.  Past Medical History positive for chronic obstructive lung disease, hemorrhoids, anxiety, cataracts, left carotid artery stenosis and GERD.   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    Family History is positive for heart attack and cancer.   Social History:  The patient is married and lives at home with his wife.  He is a former smoker.  He quit 20 years ago.  He does not drink alcohol.  Review of Systems: The patient currently denies lightheadedness, dizziness, fevers, chills cardiac, pulmonary, GU or GI, or neurological issues.    EXAMINATION: Ht. 5'10", wt. 200 pounds.  T 98.3, blood pressure 137/88, P 76.  Alert and oriented x 3 and in no acute distress.  Head is normocephalic and atraumatic.  PERRLA and EOM's intact.  Neck: unremarkable.  Lungs: clear to auscultation bilaterally.  No wheezes, rales or rhonchi noted.  Heart: regular rate and rhythm with no murmurs.  Abdomen soft nontender, normoactive bowel sound x 4.  Calves nontender bilaterally.  The patient is neurovascularly intact  bilateral upper and lower extremities.  Skin is warm and dry. Examination of the left shoulder, he is lacking about 25% of motion. All the motion he has is extremely painful.  Rotator cuff is intact.     X-RAYS: Previous imaging of the left shoulder demonstrates severe end-stage degenerative joint changes.    IMPRESSION: Left shoulder end-stage degenerative joint changes and chronic pain which has failed to respond to conservative treatment.    DISPOSITION: Patient was educated on their diagnosis and typical treatment algorithm.    Will proceed with left total shoulder replacement as scheduled.  Discussed risks, benefits and possible complications of surgery, rehab and recovery time discuss.  All questions were answered.  The patient will begin outpatient physical therapy after discharge from the hospital.  Will use aspirin 325 mg. for deep venous thrombosis  prophylaxis while the patient is in the hospital.    Instructed patient to call with questions and/or concerns. Questions were encouraged and answered. 

## 2013-07-01 LAB — BASIC METABOLIC PANEL
BUN: 13 mg/dL (ref 6–23)
CO2: 22 mEq/L (ref 19–32)
Calcium: 8.5 mg/dL (ref 8.4–10.5)
Chloride: 98 mEq/L (ref 96–112)
GFR calc Af Amer: 80 mL/min — ABNORMAL LOW (ref >90)
Glucose, Bld: 132 mg/dL — ABNORMAL HIGH (ref 70–99)
Potassium: 3.8 mEq/L (ref 3.5–5.1)

## 2013-07-01 MED ORDER — OXYCODONE-ACETAMINOPHEN 10-325 MG PO TABS: 1.0000 | ORAL_TABLET | ORAL | Status: DC | PRN

## 2013-07-01 MED ORDER — METHOCARBAMOL 500 MG PO TABS: 500.0000 mg | ORAL_TABLET | Freq: Four times a day (QID) | ORAL | Status: DC

## 2013-07-01 NOTE — Discharge Summary (Signed)
PATIENT ID: Jeffrey Cobb        MRN:  [id number]          DOB/AGE: [birth year] / 72 y.o.    DISCHARGE SUMMARY  ADMISSION DATE:    06/30/2013 DISCHARGE DATE:   07/01/2013   ADMISSION DIAGNOSIS: DJD LEFT SHOULDER    DISCHARGE DIAGNOSIS:  DJD LEFT SHOULDER    ADDITIONAL DIAGNOSIS: Active Problems:   * No active hospital problems. *  Past Medical History  Diagnosis Date  . Occlusion and stenosis of carotid artery without mention of cerebral infarction   . Obstructive chronic bronchitis with exacerbation   . Other emphysema   . Mixed hyperlipidemia   . Persistent disorder of initiating or maintaining sleep   . Acute sinusitis, unspecified   . Complication of anesthesia     has problems sleeping after anesthesia  . GERD (gastroesophageal reflux disease)   . Arthritis     PROCEDURE: Procedure(s): TOTAL SHOULDER ARTHROPLASTY Left on 06/30/2013  CONSULTS: OT, Care Management      HISTORY:  See H&P in chart  HOSPITAL COURSE:  Jeffrey Cobb is a 72 y.o. admitted on 06/30/2013 and found to have a diagnosis of DJD LEFT SHOULDER.  After appropriate laboratory studies were obtained  they were taken to the operating room on 06/30/2013 and underwent  Procedure(s): TOTAL SHOULDER ARTHROPLASTY  Left.   They were given perioperative antibiotics:  Anti-infectives   Start     Dose/Rate Route Frequency Ordered Stop   06/30/13 1700  ceFAZolin (ANCEF) IVPB 1 g/50 mL premix     1 g 100 mL/hr over 30 Minutes Intravenous Every 6 hours 06/30/13 1537 07/01/13 0458   06/30/13 0600  ceFAZolin (ANCEF) IVPB 2 g/50 mL premix     2 g 100 mL/hr over 30 Minutes Intravenous On call to O.R. 06/29/13 1426 06/30/13 0850    .  Tolerated the procedure well.  Placed with a foley intraoperatively.      POD #1, allowed out of bed to a chair.  OT for ambulation and exercise program.    IV saline locked.  O2 discontionued.   The remainder of the hospital course was dedicated to ambulation and strengthening.    The patient was discharged on 1 Day Post-Op in  Stable condition.  Blood products given:none  DIAGNOSTIC STUDIES: Recent vital signs: Patient Vitals for the past 24 hrs:  BP Temp Temp src Pulse Resp SpO2  07/01/13 0510 - - - - - 95 %  07/01/13 0459 117/71 mmHg 100.3 F (37.9 C) - 83 - 84 %  07/01/13 0129 124/68 mmHg 98.4 F (36.9 C) - 65 16 96 %  06/30/13 2055 113/71 mmHg 99 F (37.2 C) - 78 16 96 %  06/30/13 1800 110/69 mmHg 97.9 F (36.6 C) - 66 18 98 %  06/30/13 1421 107/62 mmHg 97.8 F (36.6 C) - 65 18 97 %  06/30/13 1351 - - - 61 14 91 %  06/30/13 1345 - - - 60 15 92 %  06/30/13 1340 104/66 mmHg - - 66 11 93 %  06/30/13 1330 - - - 65 14 93 %  06/30/13 1315 - - - 66 16 95 %  06/30/13 1311 92/57 mmHg - - - - -  06/30/13 1300 - - - 62 13 98 %  06/30/13 1255 101/65 mmHg - - - - -  06/30/13 1245 - - - 66 15 97 %  06/30/13 1240 102/66 mmHg - - - - -  06/30/13  1230 - 97.1 F (36.2 C) - 55 17 94 %  06/30/13 1225 105/55 mmHg - - - - -  06/30/13 1215 - - - 55 15 97 %  06/30/13 1210 103/61 mmHg - - - - -  06/30/13 1155 100/59 mmHg - - 55 13 95 %  06/30/13 1145 - - - 55 10 97 %  06/30/13 1141 109/65 mmHg - - 61 10 96 %  06/30/13 1130 - - - 58 14 98 %  06/30/13 1125 95/56 mmHg - - 62 14 95 %  06/30/13 1115 - 97.4 F (36.3 C) - 69 17 97 %  06/30/13 1110 98/69 mmHg - - 69 14 97 %  06/30/13 1100 - - - 72 23 98 %  06/30/13 1057 112/71 mmHg - - 67 22 98 %  06/30/13 1053 - 96.5 F (35.8 C) - 70 14 95 %  06/30/13 0651 131/82 mmHg 97.4 F (36.3 C) Oral 60 18 95 %       Recent laboratory studies:  Recent Labs  06/30/13 1631 07/01/13 0447  WBC 10.3  --   HGB 13.2 12.8*  HCT 38.4* 37.1*  PLT 126*  --     Recent Labs  06/30/13 1631 07/01/13 0447  NA  --  132*  K  --  3.8  CL  --  98  CO2  --  22  BUN  --  13  CREATININE 1.00 1.05  GLUCOSE  --  132*  CALCIUM  --  8.5   Lab Results  Component Value Date   INR 0.98 06/22/2013     Recent Radiographic Studies :   Dg Chest 2 View  06/22/2013   CLINICAL DATA:  Preop, ex-smoker, COPD  EXAM: CHEST  2 VIEW  COMPARISON:  08/09/2010  FINDINGS: Cardiomediastinal silhouette is stable. Right shoulder prosthesis again noted. Stable degenerative changes thoracic spine. No acute infiltrate or pleural effusion. No pulmonary edema.  IMPRESSION: No active cardiopulmonary disease.  No significant change.   Electronically Signed   By: Natasha Mead M.D.   On: 06/22/2013 11:17   Dg Shoulder Left  06/30/2013   CLINICAL DATA:  Postop left shoulder replacement  EXAM: LEFT SHOULDER - 2+ VIEW  COMPARISON:  Chest x-ray of 06/22/2013  FINDINGS: A prosthetic left humeral head is present in good position. No complicating features are seen. There is mild degenerative change of the left AC joint.  IMPRESSION: Prosthetic left humeral head in good position with no complicating features.   Electronically Signed   By: Dwyane Dee M.D.   On: 06/30/2013 11:32    DISCHARGE INSTRUCTIONS: Discharge Orders   Future Orders Complete By Expires   Call MD / Call 911  As directed    Comments:     If you experience chest pain or shortness of breath, CALL 911 and be transported to the hospital emergency room.  If you develope a fever above 101 F, pus (white drainage) or increased drainage or redness at the wound, or calf pain, call your surgeon's office.   Constipation Prevention  As directed    Comments:     Drink plenty of fluids.  Prune juice may be helpful.  You may use a stool softener, such as Colace (over the counter) 100 mg twice a day.  Use MiraLax (over the counter) for constipation as needed.   Diet - low sodium heart healthy  As directed    Discharge instructions  As directed    Comments:  Care After Instructions Refer to this sheet in the next few weeks. These discharge instructions provide you with general information on caring for yourself after you leave the hospital. Your caregiver may also give you specific instructions. Your  treatment has been planned according to the most current medical practices available, but unavoidable complications sometimes occur. If you have any problems or questions after discharge, please call your caregiver.  HOME INSTRUCTIONS You may resume a normal diet. Change dressing as needed .  Shoulder immobilizer must be on at all times.  Do not move arm.  Ice off an on as needed. Avoid lifting or driving until you are instructed otherwise.  Make an appointment to see your caregiver for stitches (suture) or staple removal as directed.   SEEK MEDICAL CARE IF: You have swelling of your calf or leg.  You develop shortness of breath or chest pain.  You have redness, swelling, or increasing pain in the wound.  There is pus or any unusual drainage coming from the surgical site.  You notice a bad smell coming from the surgical site or dressing.  The surgical site breaks open after sutures or staples have been removed.  There is persistent bleeding from the suture or staple line.  You are getting worse or are not improving.  You have any other questions or concerns.  SEEK IMMEDIATE MEDICAL CARE IF:  You have a fever greater than 101 You develop a rash.  You have difficulty breathing.  You develop any reaction or side effects to medicines given.   MAKE SURE YOU:  Understand these instructions.  Will watch your condition.  Will get help right away if you are not doing well or get worse.   Increase activity slowly as tolerated  As directed       DISCHARGE MEDICATIONS:     Medication List    STOP taking these medications       HYDROcodone-acetaminophen 5-325 MG per tablet  Commonly known as:  NORCO/VICODIN      TAKE these medications       aspirin EC 81 MG tablet  Take 1 tablet (81 mg total) by mouth daily.     clonazePAM 0.5 MG tablet  Commonly known as:  KLONOPIN  Take 0.5 mg by mouth At bedtime as needed (for sleep).     doxazosin 2 MG tablet  Commonly known as:  CARDURA   Take 2 mg by mouth daily.     fluticasone 50 MCG/ACT nasal spray  Commonly known as:  FLONASE  Place 2 sprays into both nostrils daily as needed for allergies.     LEVITRA 20 MG tablet  Generic drug:  vardenafil  Take 20 mg by mouth daily as needed for erectile dysfunction.     methocarbamol 500 MG tablet  Commonly known as:  ROBAXIN  Take 1 tablet (500 mg total) by mouth 4 (four) times daily.     oxyCODONE-acetaminophen 10-325 MG per tablet  Commonly known as:  PERCOCET  Take 1-2 tablets by mouth every 4 (four) hours as needed for pain (max 8/day).     pantoprazole 40 MG tablet  Commonly known as:  PROTONIX  Take 40 mg by mouth 2 (two) times daily.     pramipexole 0.5 MG tablet  Commonly known as:  MIRAPEX  Take 1 mg by mouth daily.     sertraline 50 MG tablet  Commonly known as:  ZOLOFT  Take 50 mg by mouth at bedtime.  FOLLOW UP VISIT:       Follow-up Information   Follow up with Loreta Ave, MD In 2 weeks.   Specialty:  Orthopedic Surgery   Contact information:   [address] [place] [address] [phone]       DISPOSITION:   Home  CONDITION:  Stable   Wilkie Aye 07/01/2013, 6:40 AM

## 2013-07-01 NOTE — Op Note (Signed)
NAMEKIERON, Jeffrey NO.:  [id number]  MEDICAL RECORD NO.:  [id number]  LOCATION:  5N21C                        FACILITY:  [facility]  PHYSICIAN:  Loreta Ave, M.D. DATE OF BIRTH:  [birth year]  DATE OF PROCEDURE:  01/03/2013 DATE OF DISCHARGE:  07/01/2013                              OPERATIVE REPORT   PREOPERATIVE DIAGNOSIS:  Left shoulder end-stage degenerative arthritis.  POSTOPERATIVE DIAGNOSIS:  Left shoulder end-stage degenerative arthritis with mild-to-moderate loss of motion and adhesive capsulitis.  PROCEDURE:  Left shoulder exam under anesthesia with manipulation. Total shoulder replacement utilizing Stryker components.  A cemented pegged 48 mm glenoid component, Press-Fit 13 mm humeral stem with a 48 x 21 mm metallic head.  SURGEON:  Loreta Ave, M.D.  ASSISTANT:  [name], PA-C present throughout the entire case and necessary for timely completion of procedure.  ANESTHESIA:  General.  BLOOD LOSS:  Minimal.  SPECIMENS:  None.  CULTURES:  None.  COMPLICATIONS:  None.  DRESSING:  Soft compressive, shoulder immobilizer.  DESCRIPTION OF PROCEDURE:  The patient was brought to the operating room, placed on the operating table in supine position.  After adequate anesthesia had been obtained, left shoulder examined, about 80% of motion.  I could break up some early adhesions achieving about 95% of glenoid stability.  Placed in beach-chair position on the shoulder positioner, prepped and draped in usual sterile fashion.  Deltopectoral incision.  Skin and subcutaneous tissue divided.  Hemostasis with cautery.  Deltopectoral interval opened.  Retractors put in place. Conjoined tendon retracted.  I released part of the CA ligament off the coracoid for exposure.  Subscap was taken down tied with FiberWire.  The humeral neck was then cut at 30 degrees retroversion.  Shoulder exposed. Adhesions debrided.  Labral tears cleaned up.  Grade 4  changes.  Brought the glenoid up to good bleeding bone.  Sized for a 48 mm component. Drill holes were made.  Copious irrigation.  The pegged 48 mm component was then firmly cemented into place and I was very pleased with alignment.  The humerus exposed.  I then used hand-held reamers and broaches to get good fitting proximally and distally.  I elected a 13 mm stem which was hammered in place.  48 x 21 head was attached.  This gave great coverage, good sizing, stable shoulder.  Rotator cuff biceps tendon intact and protected throughout.  Once all components were in place, the wound was irrigated again.  Subscap repaired anatomically with FiberWire.  Deltopectoral allowed to close. Subcutaneous and subcuticular closure of his wound.  Steri-Strips. Sterile compressive dressing applied.  Shoulder immobilizer applied. Anesthesia reversed.  Brought to the recovery room.  Tolerated the surgery well.  No complications.     Loreta Ave, M.D.     DFM/MEDQ  D:  06/30/2013  T:  07/01/2013  Job:  [id number]

## 2013-07-01 NOTE — Progress Notes (Signed)
Patient discharged to home with wife. D/C instructions given, no questions verbalized. Vitals stable.

## 2013-07-01 NOTE — Progress Notes (Signed)
   CARE MANAGEMENT NOTE 07/01/2013  Patient:  Jeffrey Cobb, Jeffrey Cobb   Account Number:  [id number]  Date Initiated:  07/01/2013  Documentation initiated by:  [facility]  Subjective/Objective Assessment:   TOTAL SHOULDER ARTHROPLASTY (Left)     Action/Plan:   no NCM needs identifed.   Anticipated DC Date:  07/01/2013   Anticipated DC Plan:  HOME/SELF CARE      DC Planning Services  CM consult      Choice offered to / List presented to:             Status of service:  Completed, signed off Medicare Important Message given?   (If response is "NO", the following Medicare IM given date fields will be blank) Date Medicare IM given:   Date Additional Medicare IM given:    Discharge Disposition:  HOME/SELF CARE  Per UR Regulation:    If discussed at Long Length of Stay Meetings, dates discussed:    Comments:

## 2013-07-01 NOTE — Evaluation (Signed)
Occupational Therapy Evaluation Patient Details Name: Jeffrey Cobb MRN: [id number] DOB: [birth year] Today's Date: 07/01/2013 Time: 1308-6578 OT Time Calculation (min): 49 min  OT Assessment / Plan / Recommendation History of present illness s/p L TSA   Clinical Impression   All education completed.  No further OT.   OT Assessment  Patient does not need any further OT services;Progress rehab of shoulder as ordered by MD at follow-up appointment    Follow Up Recommendations  No OT follow up;Supervision - Intermittent    Barriers to Discharge      Equipment Recommendations  None recommended by OT    Recommendations for Other Services    Frequency       Precautions / Restrictions Precautions Precautions: Shoulder Type of Shoulder Precautions: no movement Shoulder Interventions: Shoulder sling/immobilizer Precaution Booklet Issued: Yes (comment) Required Braces or Orthoses: Sling   Pertinent Vitals/Pain L shoulder, did not rate, premedicated, repositioned, iced    ADL  Eating/Feeding: Set up Where Assessed - Eating/Feeding: Chair Grooming: Modified independent;Wash/dry hands Where Assessed - Grooming: Unsupported standing Upper Body Bathing: Minimal assistance Where Assessed - Upper Body Bathing: Unsupported sitting Lower Body Bathing: Minimal assistance Where Assessed - Lower Body Bathing: Unsupported standing Upper Body Dressing: Minimal assistance Where Assessed - Upper Body Dressing: Unsupported sitting Lower Body Dressing: Minimal assistance Where Assessed - Lower Body Dressing: Unsupported sitting;Supported sit to stand Toilet Transfer: Modified independent Toilet Transfer Method: Sit to Barista: Comfort height toilet Toileting - Clothing Manipulation and Hygiene: Independent Transfers/Ambulation Related to ADLs: modified independent--decreased velocity ADL Comments: Instructed in hemi techniques for bathing and dressing, sling use, L  UE positioning in bed and in chair, AROM elbow to hand.    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  Visit Information  Last OT Received On: 07/01/13 History of Present Illness: s/p L TSA       Prior Functioning     Home Living Family/patient expects to be discharged to:: Private residence Living Arrangements: Spouse/significant other Available Help at Discharge: Available 24 hours/day;Family Type of Home:  (town home) Home Equipment: None Prior Function Level of Independence: Independent Communication Communication: No difficulties Dominant Hand: Left         Vision/Perception Vision - History Baseline Vision: Wears glasses all the time Patient Visual Report: No change from baseline   Cognition  Cognition Arousal/Alertness: Awake/alert Behavior During Therapy: WFL for tasks assessed/performed Overall Cognitive Status: Within Functional Limits for tasks assessed    Extremity/Trunk Assessment Upper Extremity Assessment Upper Extremity Assessment: LUE deficits/detail LUE Deficits / Details: no movement L shoulder, full AROM elbow to hand, arthritic changes in hands LUE Coordination: decreased gross motor Lower Extremity Assessment Lower Extremity Assessment: Overall WFL for tasks assessed Cervical / Trunk Assessment Cervical / Trunk Assessment: Normal     Mobility Bed Mobility Bed Mobility: Not assessed (pt up in chair) Transfers Transfers: Sit to Stand;Stand to Sit Sit to Stand: 6: Modified independent (Device/Increase time) Stand to Sit: 6: Modified independent (Device/Increase time)     Exercise     Balance     End of Session OT - End of Session Activity Tolerance: Patient tolerated treatment well Patient left: in chair;with call bell/phone within reach;with family/visitor present;with  nursing/sitter in room Nurse Communication:  (OT education completed)  GO  Evern Bio 07/01/2013, 9:42 AM [phone]

## 2013-07-01 NOTE — Progress Notes (Signed)
Patient ID: Jeffrey Cobb, male   DOB: 1941-05-08, 72 y.o.   MRN: [id number]  PROGRESS NOTE  Subjective:  negative for Chest Pain  negative for Shortness of Breath  negative for Nausea/Vomiting   negative for Calf Pain  negative for Bowel Movement   Tolerating Diet: yes         Patient reports pain as 4 on 0-10 scale.       Objective: Vital signs in last 24 hours:   Patient Vitals for the past 24 hrs:  BP Temp Temp src Pulse Resp SpO2  07/01/13 0510 - - - - - 95 %  07/01/13 0459 117/71 mmHg 100.3 F (37.9 C) - 83 - 84 %  07/01/13 0129 124/68 mmHg 98.4 F (36.9 C) - 65 16 96 %  06/30/13 2055 113/71 mmHg 99 F (37.2 C) - 78 16 96 %  06/30/13 1800 110/69 mmHg 97.9 F (36.6 C) - 66 18 98 %  06/30/13 1421 107/62 mmHg 97.8 F (36.6 C) - 65 18 97 %  06/30/13 1351 - - - 61 14 91 %  06/30/13 1345 - - - 60 15 92 %  06/30/13 1340 104/66 mmHg - - 66 11 93 %  06/30/13 1330 - - - 65 14 93 %  06/30/13 1315 - - - 66 16 95 %  06/30/13 1311 92/57 mmHg - - - - -  06/30/13 1300 - - - 62 13 98 %  06/30/13 1255 101/65 mmHg - - - - -  06/30/13 1245 - - - 66 15 97 %  06/30/13 1240 102/66 mmHg - - - - -  06/30/13 1230 - 97.1 F (36.2 C) - 55 17 94 %  06/30/13 1225 105/55 mmHg - - - - -  06/30/13 1215 - - - 55 15 97 %  06/30/13 1210 103/61 mmHg - - - - -  06/30/13 1155 100/59 mmHg - - 55 13 95 %  06/30/13 1145 - - - 55 10 97 %  06/30/13 1141 109/65 mmHg - - 61 10 96 %  06/30/13 1130 - - - 58 14 98 %  06/30/13 1125 95/56 mmHg - - 62 14 95 %  06/30/13 1115 - 97.4 F (36.3 C) - 69 17 97 %  06/30/13 1110 98/69 mmHg - - 69 14 97 %  06/30/13 1100 - - - 72 23 98 %  06/30/13 1057 112/71 mmHg - - 67 22 98 %  06/30/13 1053 - 96.5 F (35.8 C) - 70 14 95 %  06/30/13 0651 131/82 mmHg 97.4 F (36.3 C) Oral 60 18 95 %      Intake/Output from previous day:   11/05 0701 - 11/06 0700 In: 2511 [P.O.:720; I.V.:1641] Out: 250    Intake/Output this shift:   11/05 1901 - 11/06 0700 In: 241  [I.V.:141] Out: -    Intake/Output     11/05 0701 - 11/06 0700   P.O. 720   I.V. 1641   IV Piggyback 150   Total Intake 2511   Blood 250   Total Output 250   Net +2261       Urine Occurrence 1 x      LABORATORY DATA:  Recent Labs  06/30/13 1631 07/01/13 0447  WBC 10.3  --   HGB 13.2 12.8*  HCT 38.4* 37.1*  PLT 126*  --     Recent Labs  06/30/13 1631  CREATININE 1.00   Lab Results  Component Value Date   INR 0.98  06/22/2013    Examination:  General appearance: alert, cooperative and no distress  Wound Exam: clean, dry, intact, dressing changed  Drainage:  None: wound tissue dry  Motor Exam: grossly intact bilateral LE  Sensory Exam: grossly intact bilateral LE  Vascular Exam: Normal  Assessment:    1 Day Post-Op  Procedure(s) (LRB): TOTAL SHOULDER ARTHROPLASTY (Left)  ADDITIONAL DIAGNOSIS:  Active Problems:   * No active hospital problems. *     Plan: Occupational Therapy as ordered   DVT Prophylaxis:  Lovenox and Foot Pumps  DISCHARGE PLAN: Home  DISCHARGE NEEDS: DME rec per OT         [name] JAMES 07/01/2013, 6:32 AM

## 2013-07-02 ENCOUNTER — Encounter (HOSPITAL_COMMUNITY): Payer: Self-pay | Admitting: Orthopedic Surgery

## 2013-07-21 ENCOUNTER — Other Ambulatory Visit: Payer: Self-pay | Admitting: *Deleted

## 2013-07-21 MED ORDER — PANTOPRAZOLE SODIUM 40 MG PO TBEC: 40.0000 mg | DELAYED_RELEASE_TABLET | Freq: Two times a day (BID) | ORAL | Status: DC

## 2013-07-23 ENCOUNTER — Other Ambulatory Visit: Payer: Self-pay | Admitting: Neurology

## 2013-08-31 ENCOUNTER — Encounter: Payer: Self-pay | Admitting: Pulmonary Disease

## 2013-08-31 ENCOUNTER — Ambulatory Visit (INDEPENDENT_AMBULATORY_CARE_PROVIDER_SITE_OTHER): Payer: Medicare Other | Admitting: Pulmonary Disease

## 2013-08-31 VITALS — BP 114/82 | HR 93 | Temp 97.7°F | Ht 70.0 in | Wt 197.8 lb

## 2013-08-31 DIAGNOSIS — J209 Acute bronchitis, unspecified: Secondary | ICD-10-CM

## 2013-08-31 DIAGNOSIS — J449 Chronic obstructive pulmonary disease, unspecified: Secondary | ICD-10-CM

## 2013-08-31 DIAGNOSIS — R059 Cough, unspecified: Secondary | ICD-10-CM

## 2013-08-31 DIAGNOSIS — R05 Cough: Secondary | ICD-10-CM

## 2013-08-31 MED ORDER — METHYLPREDNISOLONE 16 MG PO TABS: ORAL_TABLET | ORAL | Status: DC

## 2013-08-31 MED ORDER — HYDROCODONE-HOMATROPINE 5-1.5 MG/5ML PO SYRP: 5.0000 mL | ORAL_SOLUTION | Freq: Four times a day (QID) | ORAL | Status: DC | PRN

## 2013-08-31 MED ORDER — AMOXICILLIN-POT CLAVULANATE 875-125 MG PO TABS: 1.0000 | ORAL_TABLET | Freq: Two times a day (BID) | ORAL | Status: DC

## 2013-08-31 NOTE — Progress Notes (Signed)
   Subjective:    Patient ID: Jeffrey Cobb, male    DOB: May 17, 1941, 73 y.o.   MRN: [id number]  HPI Patient comes in today for an acute sick visit. 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 He gives a 4 week history of increasing cough, chest congestion, and purulent mucus. This has been associated with increasing shortness of breath, and has resulted in his cyclical coughing which is very common for him. He is been treated with a course of Levaquin and prednisone without change. He is also been on narcotic cough syrups without change. He tells me that he has had a chest x-ray that was unrevealing. He continues to bring up purulent mucus, and feels congested in his chest.   Review of Systems  Constitutional: Positive for fatigue. Negative for fever and unexpected weight change.  HENT: Positive for congestion. Negative for dental problem, ear pain, nosebleeds, postnasal drip, rhinorrhea, sinus pressure, sneezing, sore throat and trouble swallowing.   Eyes: Negative for redness and itching.  Respiratory: Positive for cough, shortness of breath and wheezing. Negative for chest tightness.   Cardiovascular: Negative for palpitations and leg swelling.  Gastrointestinal: Negative for nausea and vomiting.  Genitourinary: Negative for dysuria.  Musculoskeletal: Negative for joint swelling.  Skin: Negative for rash.  Neurological: Positive for light-headedness ( with cough). Negative for headaches.  Hematological: Does not bruise/bleed easily.  Psychiatric/Behavioral: Negative for dysphoric mood. The patient is not nervous/anxious.        Objective:   Physical Exam Well-developed male in no acute distress Nose without purulence or discharge noted Oropharynx clear Neck without lymphadenopathy or thyromegaly Chest with decreased breath sounds, a few rhonchi noted, no  wheezing Cardiac exam with regular rate and rhythm Lower extremities without edema, no cyanosis Alert and oriented, moves all 4 extremities.       Assessment & Plan:

## 2013-08-31 NOTE — Assessment & Plan Note (Signed)
The patient has had cough and chest congestion with discolored mucus since the middle of December, and did not have a response to treatment with Levaquin. Will treat him with another round of antibiotics since he is still having discolored mucus, and will use Augmentin this go around.

## 2013-08-31 NOTE — Assessment & Plan Note (Signed)
The patient has a long history of chronic cough associated with the irritable larynx syndrome. Once his cough is escalated because of a superimposed infection, it is very difficult to return him to baseline. He has seen very limited response to narcotic cough syrup, and I have reminded him again about the behavioral therapies that will also help. I suspect he will have a slow recovery from this perspective.

## 2013-08-31 NOTE — Patient Instructions (Signed)
Will treat with augmentin 875mg  one each am and pm on full stomach with large glass of water for 5 days Medrol taper:  32 mg each day for 2 days, then 16mg  each day for 2 days, then 8mg  each day for 2 days, then stop Complete voice rest, no throat clearing, hard candy (no mint or cough drops) to bathe back of throat. hydromet cough syrup one teaspoon every 6 hrs if needed for severe cough.  Let me know if you are not improving.  This will take a significant period of time for cough to return back to baseline.

## 2013-08-31 NOTE — Assessment & Plan Note (Addendum)
The pt has a history of mild disease, and only uses rescue medication as needed. I suspect he is having a mild exacerbation associated with his pulmonary infection, and we'll therefore treat him with another round of steroids.

## 2013-12-28 ENCOUNTER — Other Ambulatory Visit: Payer: Self-pay

## 2013-12-28 DIAGNOSIS — I6529 Occlusion and stenosis of unspecified carotid artery: Secondary | ICD-10-CM

## 2013-12-31 ENCOUNTER — Encounter: Payer: Self-pay | Admitting: *Deleted

## 2014-01-03 ENCOUNTER — Encounter: Payer: Self-pay | Admitting: Nurse Practitioner

## 2014-01-03 ENCOUNTER — Ambulatory Visit (INDEPENDENT_AMBULATORY_CARE_PROVIDER_SITE_OTHER): Payer: Medicare Other | Admitting: Nurse Practitioner

## 2014-01-03 VITALS — BP 131/77 | HR 76 | Ht 67.5 in | Wt 202.0 lb

## 2014-01-03 DIAGNOSIS — G2581 Restless legs syndrome: Secondary | ICD-10-CM

## 2014-01-03 DIAGNOSIS — R400 Somnolence: Secondary | ICD-10-CM

## 2014-01-03 DIAGNOSIS — G4710 Hypersomnia, unspecified: Secondary | ICD-10-CM

## 2014-01-03 DIAGNOSIS — R209 Unspecified disturbances of skin sensation: Secondary | ICD-10-CM | POA: Insufficient documentation

## 2014-01-03 NOTE — Patient Instructions (Signed)
Patient will continue Mirapex for restless legs We recheck sleep study, last in 2008 for complaints of chronic insomnia Followup after sleep study with Dr. Vickey Hugerohmeier Followup with me yearly

## 2014-01-03 NOTE — Progress Notes (Signed)
[facility]  PATIENT: Jeffrey PewRufus B Belleau DOB: 1941/05/29   REASON FOR VISIT: for RLS, sensory dysesthesias and chronic insomnia   HISTORY OF PRESENT ILLNESS: Mr. Juanda BondScarboro, 73 year old male returns for followup. He has a history of restless leg syndrome and sensory dysesthesias. He has also had chronic insomnia. He had a sleep study in 2008 which did not show obstructive sleep apnea, however he tells me today that this is still a problem for him and there are nights when he cannot go to sleep until 6:00 in the morning. He has some daytime drowsiness. On the nights that he does sleep well he is refreshed. His primary care Dr. Tenny Crawoss wants to switch him to a sleeping agent however he would like a sleep study done first according to the patient. Also the patient does state that he snores but he is not sure if he quits breathing at night. He returns for reevaluation   HISTORY: of sensory dysesthesias, restless legs and insomnia.  MRI of the spine (lumbar) that he had done in April of 2009 showed moderate to severe spinal stenosis. He had lumbar spine decompression and fusion by Dr. Gerlene FeeKritzer in 2011, did very well. He also has a history of right and left knee replacements and a shoulder replacement. He is currently taking Mirapex for his restless legs tolerating the medication without drowsiness or any type of compulsive behaviors. iron profile was normal.       REVIEW OF SYSTEMS: Full 14 system review of systems performed and notable only for those listed, all others are neg:  Constitutional: N/A  Cardiovascular: N/A  Ear/Nose/Throat: Ringing in the ears Skin: N/A  Eyes: N/A  Respiratory: N/A  Gastroitestinal: N/A  Hematology/Lymphatic: Easy bruising Endocrine: Intolerance to cold  Musculoskeletal: Joint pain, back pain Allergy/Immunology: N/A  Neurological: N/A Psychiatric: N/A Sleep : Restless legs, snoring, insomnia  ALLERGIES: Allergies  Allergen Reactions  .  Sulfonamide Derivatives Rash  . Trazodone And Nefazodone Rash and Other (See Comments)    Hallucinations     HOME MEDICATIONS: Outpatient Prescriptions Prior to Visit  Medication Sig Dispense Refill  . aspirin EC 81 MG tablet Take 1 tablet (81 mg total) by mouth daily.  90 tablet  3  . clonazePAM (KLONOPIN) 0.5 MG tablet Take 0.5 mg by mouth At bedtime as needed (for sleep).       Marland Kitchen. doxazosin (CARDURA) 2 MG tablet Take 2 mg by mouth daily.       Marland Kitchen. LEVITRA 20 MG tablet Take 20 mg by mouth daily as needed for erectile dysfunction.       . pantoprazole (PROTONIX) 40 MG tablet Take 1 tablet (40 mg total) by mouth 2 (two) times daily.  30 tablet  11  . pramipexole (MIRAPEX) 0.5 MG tablet Take 1 mg by mouth daily.      . sertraline (ZOLOFT) 50 MG tablet Take 50 mg by mouth at bedtime.      Marland Kitchen. amoxicillin-clavulanate (AUGMENTIN) 875-125 MG per tablet Take 1 tablet by mouth 2 (two) times daily. On a full stomcah  10 tablet  0  . HYDROcodone-homatropine (HYCODAN) 5-1.5 MG/5ML syrup Take 5 mLs by mouth every 6 (six) hours as needed for cough.  180 mL  0  . methocarbamol (ROBAXIN) 500 MG tablet Take 1 tablet (500 mg total) by mouth 4 (four) times daily.  90 tablet  1  . methylPREDNISolone (MEDROL) 16 MG tablet Take 2 tabs x 2 days, then 1 x 2 days, then 1/2  x 2 days then stop.  7 tablet  0  . oxyCODONE-acetaminophen (PERCOCET) 10-325 MG per tablet Take 1-2 tablets by mouth every 4 (four) hours as needed for pain (max 8/day).  60 tablet  0   No facility-administered medications prior to visit.    PAST MEDICAL HISTORY: Past Medical History  Diagnosis Date  . Occlusion and stenosis of carotid artery without mention of cerebral infarction   . Obstructive chronic bronchitis with exacerbation   . Other emphysema   . Mixed hyperlipidemia   . Persistent disorder of initiating or maintaining sleep   . Acute sinusitis, unspecified   . Complication of anesthesia     has problems sleeping after anesthesia    . GERD (gastroesophageal reflux disease)   . Arthritis     PAST SURGICAL HISTORY: Past Surgical History  Procedure Laterality Date  . Spine surgery  2000/2011  . Appendectomy  1956  . Right ankle reconstruction  1986  . Ruptured disk  2000  . Left knee replacement  2001  . Right knee replacement  2005  . Joint replacement Right     shoulder replacement  . Eye surgery Bilateral     cataract removal  . Torn retina    . Total shoulder arthroplasty Left 06/30/2013    Procedure: TOTAL SHOULDER ARTHROPLASTY;  Surgeon: Loreta Aveaniel F Murphy, MD;  [facility];  Service: Orthopedics;  Laterality: Left;    FAMILY HISTORY: Family History  Problem Relation Age of Onset  . Emphysema Mother   . Heart disease Mother   . Heart disease Father   . Allergies      FH: FATHER,2 SISTER,1 BROTHER, SON  DAUGHTER  . Cancer Sister     SOCIAL HISTORY: History   Social History  . Marital Status: Married    Spouse Name: Myriam JacobsonHelen     Number of Children: 2  . Years of Education: Assoc    Occupational History  . Retired    Social History Main Topics  . Smoking status: Former Smoker -- 3.00 packs/day for 17 years    Types: Cigarettes    Quit date: 08/26/1977  . Smokeless tobacco: Never Used  . Alcohol Use: Yes     Comment: rarely  . Drug Use: No  . Sexual Activity: Not on file   Other Topics Concern  . 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    Patient is left handed              PHYSICAL EXAM  Filed Vitals:   01/03/14 1021  BP: 131/77  Pulse: 76  Height: 5' 7.5" (1.715 m)  Weight: 202 lb (91.627 kg)   Body mass index is 31.15 kg/(m^2).  Generalized: Well developed,  Mildly obese male in no acute distress  Head: normocephalic and atraumatic,. Oropharynx benign  Neck: Supple, no carotid bruits neck size 16.5 inches Cardiac: Regular rate rhythm, no murmur   Musculoskeletal: Arthritic changes in hands  Neurological examination   Mentation: Alert oriented to time, place, history taking. Follows all commands speech and language fluent. ESS 9  Cranial nerve II-XII: Pupils were equal round reactive to light extraocular movements were full, visual field were full on confrontational test. Facial sensation and strength were normal. hearing was intact to finger rubbing bilaterally. Uvula tongue midline. head turning and shoulder shrug were normal and symmetric.Tongue  protrusion into cheek strength was normal. Motor: normal bulk and tone, full strength in the BUE, BLE, fine finger movements normal, no pronator drift. No focal weakness Sensory: Mildly decreased light touch and pinprick to mid foot bilaterally,   vibration normal Coordination: finger-nose-finger, heel-to-shin bilaterally, no dysmetria Reflexes: Brachioradialis 2/2, biceps 2/2, triceps 2/2, patellar 2/2, Achilles 1/1, plantar responses were flexor bilaterally. Gait and Station: Rising up from seated position without assistance, normal stance,  moderate stride, good arm swing, smooth turning, able to perform tiptoe, and heel walking without difficulty. Tandem gait is steady  DIAGNOSTIC DATA (LABS, IMAGING, TESTING) - I reviewed patient records, labs, notes, testing and imaging myself where available.  Lab Results  Component Value Date   WBC 10.3 06/30/2013   HGB 12.8* 07/01/2013   HCT 37.1* 07/01/2013   MCV 91.6 06/30/2013   PLT 126* 06/30/2013      Component Value Date/Time   NA 132* 07/01/2013 0447   K 3.8 07/01/2013 0447   CL 98 07/01/2013 0447   CO2 22 07/01/2013 0447   GLUCOSE 132* 07/01/2013 0447   BUN 13 07/01/2013 0447   CREATININE 1.05 07/01/2013 0447   CALCIUM 8.5 07/01/2013 0447   PROT 7.6 06/22/2013 1053   ALBUMIN 3.7 06/22/2013 1053   AST 21 06/22/2013 1053   ALT 14 06/22/2013 1053   ALKPHOS 82 06/22/2013 1053   BILITOT 0.3 06/22/2013 1053   GFRNONAA 69* 07/01/2013 0447    GFRAA 80* 07/01/2013 0447     ASSESSMENT AND PLAN  73 y.o. year old male  has a past medical history of Persistent disorder of initiating or maintaining sleep; and restless leg syndrome here to follow up  Patient will continue Mirapex for restless legs We recheck sleep study, last in 2008 for complaints of chronic insomnia, snoring, restless legs Followup after sleep study with Dr. Vickey Huger Followup with me yearly Nilda Riggs, [facility], [facility], APRN  [facility] [address] [place], [place] [address] [phone]

## 2014-01-05 ENCOUNTER — Telehealth: Payer: Self-pay | Admitting: Neurology

## 2014-01-05 DIAGNOSIS — G4700 Insomnia, unspecified: Secondary | ICD-10-CM

## 2014-01-05 NOTE — Telephone Encounter (Signed)
Sarita Bottomarolyn Martin,NP.Marland Kitchen. refers patient for attended sleep study.  Height: 5'7.5  Weight: 202 lbs.  BMI: 31.15  Past Medical History:  Occlusion and stenosis of carotid artery without mention of cerebral infarction  .  Obstructive chronic bronchitis with exacerbation  .  Other emphysema  .  Mixed hyperlipidemia  .  Persistent disorder of initiating or maintaining sleep  .  Acute sinusitis, unspecified  .  Complication of anesthesia  has problems sleeping after anesthesia  .  GERD (gastroesophageal reflux disease)  .  Arthritis    Sleep Symptoms: (per Carolyn's notes) He had a sleep study in 2008 which did not show obstructive sleep apnea, however he tells me today that this is still a problem for him and there are nights when he cannot go to sleep until 6:00 in the morning. He has some daytime drowsiness. On the nights that he does sleep well he is refreshed. His primary care Dr. Tenny Crawoss wants to switch him to a sleeping agent however he would like a sleep study done.     Epworth Score: 9  Medications: Ascorbic Acid VITAMIN C PO Take by mouth. Aspirin (Tablet Delayed Response) aspirin EC 81 MG Take 1 tablet (81 mg total) by mouth daily. Cholecalciferol (Cap) Vitamin D 2000 UNITS Take by mouth. Cinnamon (Tab) Cinnamon 500 MG Take by mouth. ClonazePAM (Tab) KLONOPIN 0.5 MG Take 0.5 mg by mouth At bedtime as needed (for sleep). Diclofenac Sodium (Gel) VOLTAREN 1 % Apply topically as needed. Doxazosin Mesylate (Tab) CARDURA 2 MG Take 2 mg by mouth daily. Mag Oxide-Vit D3-Turmeric (Cap) Magnesium-Vitamin D3-Turmeric [id number] MG-UNIT-MG Take by mouth. Multiple Vitamin MULTI VITAMIN DAILY PO Take by mouth. Omega-3 Fatty Acids (Cap) Omega-3 Fish Oil 300 MG Take by mouth. Pantoprazole Sodium (Tablet Delayed Response) PROTONIX 40 MG Take 1 tablet (40 mg total) by mouth 2 (two) times daily. Pramipexole Dihydrochloride (Tab) MIRAPEX 0.5 MG Take 1 mg by mouth daily. Sertraline HCl (Tab) ZOLOFT 50  MG Take 50 mg by mouth at bedtime. Vardenafil HCl (Tab) LEVITRA 20 MG Take 20 mg by mouth daily as needed for erectile dysfunction.     Insurance: [employer]-Medicare  Carolyn's Assessment and Plan:  73 y.o. year old male has a past medical history of Persistent disorder of initiating or maintaining sleep; and restless leg syndrome here to follow up  Patient will continue Mirapex for restless legs  We recheck sleep study, last in 2008 for complaints of chronic insomnia, snoring, restless legs  Followup after sleep study with Dr. Vickey Hugerohmeier  Followup with me yearly  Nilda RiggsNancy Carolyn Martin, [facility], [facility], APRN      Please review patient information and submit instructions for scheduling and orders for sleep technologist.  Thank you!

## 2014-01-05 NOTE — Telephone Encounter (Signed)
Chronic insomnia patient, referred for PSG in 2008, followed by Dr Terrace ArabiaYan.  Had no apnea ( AHI 2.8 ) at that time.  Has COPD with bronchitis , insomnia, old CVA.   Will do PSG to rule out organic reasons for insomnia. If none found, will not follow up with sleep clinic , but primary neurologist.   BMI has minimally changed over 6 years.    SPLIT at AHI 15 and score of 4% , if headaches or fatigue in AM are present, add Co2

## 2014-01-28 ENCOUNTER — Other Ambulatory Visit: Payer: Self-pay | Admitting: Neurology

## 2014-02-10 ENCOUNTER — Ambulatory Visit (INDEPENDENT_AMBULATORY_CARE_PROVIDER_SITE_OTHER): Payer: Medicare Other | Admitting: Neurology

## 2014-02-10 DIAGNOSIS — G4733 Obstructive sleep apnea (adult) (pediatric): Secondary | ICD-10-CM

## 2014-02-10 DIAGNOSIS — G4700 Insomnia, unspecified: Secondary | ICD-10-CM

## 2014-02-24 ENCOUNTER — Encounter: Payer: Self-pay | Admitting: *Deleted

## 2014-02-24 ENCOUNTER — Telehealth: Payer: Self-pay | Admitting: Neurology

## 2014-02-24 DIAGNOSIS — G4733 Obstructive sleep apnea (adult) (pediatric): Secondary | ICD-10-CM

## 2014-02-24 NOTE — Telephone Encounter (Signed)
I called and spoke with the patient about his recent sleep study results. I informed the patient that the study revealed severe obstructive sleep apnea and that he did well on CPAP during the night of his study. Dr. Vickey Hugerohmeier recommend starting CPAP therapy at home, so I will send the order to [facility]. I will send a copy of the report to Darrol Angelarolyn Martin, [facility] and Dr. Duane LopeAlan Ross office and mail a copy to the patient along with a follow up instruction letter.

## 2014-04-04 ENCOUNTER — Encounter: Payer: Self-pay | Admitting: Neurology

## 2014-04-07 ENCOUNTER — Encounter: Payer: Self-pay | Admitting: Pulmonary Disease

## 2014-04-07 ENCOUNTER — Ambulatory Visit (INDEPENDENT_AMBULATORY_CARE_PROVIDER_SITE_OTHER): Payer: Medicare Other | Admitting: Pulmonary Disease

## 2014-04-07 VITALS — BP 140/82 | HR 72 | Temp 98.2°F | Ht 70.0 in | Wt 205.4 lb

## 2014-04-07 DIAGNOSIS — R059 Cough, unspecified: Secondary | ICD-10-CM

## 2014-04-07 DIAGNOSIS — J438 Other emphysema: Secondary | ICD-10-CM

## 2014-04-07 DIAGNOSIS — R05 Cough: Secondary | ICD-10-CM

## 2014-04-07 DIAGNOSIS — J439 Emphysema, unspecified: Secondary | ICD-10-CM

## 2014-04-07 MED ORDER — TRAMADOL HCL 50 MG PO TABS: 50.0000 mg | ORAL_TABLET | Freq: Four times a day (QID) | ORAL | Status: DC | PRN

## 2014-04-07 MED ORDER — METHYLPREDNISOLONE 16 MG PO TABS: ORAL_TABLET | ORAL | Status: DC

## 2014-04-07 NOTE — Progress Notes (Signed)
   Subjective:    Patient ID: Jeffrey Cobb, male    DOB: 1941-02-09, 73 y.o.   MRN: [id number]  HPI The patient comes in today for an acute sick visit. 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 He had been doing very well with no issues until approximately 2-3 weeks ago when he began to have increasing cough.  It is dry in nature, and has a cyclical component to it, and the patient is continually clearing his throat today during our visit. The patient has some postnasal drip, but is denying reflux.   Review of Systems  Constitutional: Negative for fever and unexpected weight change.  HENT: Negative for congestion, dental problem, ear pain, nosebleeds, postnasal drip, rhinorrhea, sinus pressure, sneezing, sore throat and trouble swallowing.   Eyes: Negative for redness and itching.  Respiratory: Positive for cough, chest tightness, shortness of breath and wheezing.   Cardiovascular: Negative for palpitations and leg swelling.  Gastrointestinal: Negative for nausea and vomiting.  Genitourinary: Negative for dysuria.  Musculoskeletal: Negative for joint swelling.  Skin: Negative for rash.  Allergic/Immunologic: Negative for environmental allergies.  Neurological: Negative for headaches.  Hematological: Does not bruise/bleed easily.  Psychiatric/Behavioral: Negative for dysphoric mood. The patient is not nervous/anxious.        Objective:   Physical Exam Overweight male in no acute distress Nose without purulence or discharge noted Neck without lymphadenopathy or thyromegaly Chest totally clear to auscultation, no wheezing Cardiac exam with regular rate and rhythm Lower extremities without edema, no cyanosis Alert and oriented, moves all 4 extremities.       Assessment & Plan:

## 2014-04-07 NOTE — Patient Instructions (Signed)
Take zyrtec 10mg  one each night until cough is much improved. Medrol 16mg , take 2 each day for 2 days, then 1 each day for 2 days, then 1/2 each day for 2 days, then stop Use hard candy, voice rest, and totally avoid throat clearing. Tramadol 50mg  one every 6 hrs for cough if needed.  May make you sleepy.  Follow up with me in one year if doing well.

## 2014-04-07 NOTE — Assessment & Plan Note (Signed)
The patient is having a flare of his long-standing chronic cough secondary to the irritable larynx syndrome. He is having postnasal drip, and we'll therefore add an antihistamine to his regimen until better. Will also treat with a short course of prednisone to help with airway irritation, and I have reminded him of the importance of behavioral therapies an order to prevent throat clearing and cough propagation. Finally, we'll try tramadol to see if we can calm down the irritability of the upper airway.

## 2014-04-07 NOTE — Assessment & Plan Note (Signed)
The patient is having no breathing issues or chest congestion, and has totally clear breath sounds on exam. He does not require a maintenance regimen for his COPD, and uses albuterol as needed.

## 2014-04-12 ENCOUNTER — Encounter: Payer: Self-pay | Admitting: *Deleted

## 2014-04-14 ENCOUNTER — Ambulatory Visit (INDEPENDENT_AMBULATORY_CARE_PROVIDER_SITE_OTHER): Payer: Medicare Other

## 2014-04-14 ENCOUNTER — Ambulatory Visit (INDEPENDENT_AMBULATORY_CARE_PROVIDER_SITE_OTHER): Payer: Medicare Other | Admitting: Podiatry

## 2014-04-14 ENCOUNTER — Encounter: Payer: Self-pay | Admitting: Podiatry

## 2014-04-14 ENCOUNTER — Other Ambulatory Visit (HOSPITAL_COMMUNITY): Payer: Medicare Other

## 2014-04-14 VITALS — BP 112/73 | HR 87 | Resp 16

## 2014-04-14 DIAGNOSIS — M779 Enthesopathy, unspecified: Secondary | ICD-10-CM

## 2014-04-14 DIAGNOSIS — G589 Mononeuropathy, unspecified: Secondary | ICD-10-CM

## 2014-04-14 MED ORDER — GABAPENTIN 300 MG PO CAPS: 300.0000 mg | ORAL_CAPSULE | Freq: Two times a day (BID) | ORAL | Status: DC

## 2014-04-14 NOTE — Progress Notes (Signed)
   Subjective:    Patient ID: Jeffrey Cobb, male    DOB: Nov 16, 1940, 73 y.o.   MRN: [id number]  HPI Comments: "I have these weird feelings on my feet"  Patient c/o numbness, burning, cold and uncomfortable sensations plantar bilateral for few months. There is no pain but is noticeable more on hard floors. He has restless leg syndrome. He describes the feeling as if someone put small round sticky patches all over the bottoms of feet. No treatments tried.     Review of Systems  HENT: Positive for hearing loss, sinus pressure and tinnitus.   Respiratory: Positive for cough.   Endocrine: Positive for cold intolerance.  Musculoskeletal: Positive for arthralgias and gait problem.  Hematological: Bruises/bleeds easily.  All other systems reviewed and are negative.      Objective:   Physical Exam        Assessment & Plan:

## 2014-04-15 ENCOUNTER — Encounter: Payer: Self-pay | Admitting: Neurology

## 2014-04-15 ENCOUNTER — Ambulatory Visit (INDEPENDENT_AMBULATORY_CARE_PROVIDER_SITE_OTHER): Payer: Medicare Other | Admitting: Neurology

## 2014-04-15 VITALS — BP 120/78 | HR 75 | Resp 16 | Ht 67.5 in | Wt 207.0 lb

## 2014-04-15 DIAGNOSIS — G4733 Obstructive sleep apnea (adult) (pediatric): Secondary | ICD-10-CM | POA: Insufficient documentation

## 2014-04-15 DIAGNOSIS — G2581 Restless legs syndrome: Secondary | ICD-10-CM

## 2014-04-15 DIAGNOSIS — G4730 Sleep apnea, unspecified: Secondary | ICD-10-CM

## 2014-04-15 DIAGNOSIS — G4710 Hypersomnia, unspecified: Secondary | ICD-10-CM

## 2014-04-15 DIAGNOSIS — Z9989 Dependence on other enabling machines and devices: Secondary | ICD-10-CM

## 2014-04-15 MED ORDER — ARMODAFINIL 50 MG PO TABS: 50.0000 mg | ORAL_TABLET | Freq: Every day | ORAL | Status: DC

## 2014-04-15 MED ORDER — PRAMIPEXOLE DIHYDROCHLORIDE 0.5 MG PO TABS: ORAL_TABLET | ORAL | Status: DC

## 2014-04-15 NOTE — Patient Instructions (Signed)
Armodafinil tablets What is this medicine? ARMODAFINIL (ar moe DAF i nil) is used to treat excessive sleepiness caused by certain sleep disorders. This includes narcolepsy, sleep apnea, and shift work sleep disorder. This medicine may be used for other purposes; ask your health care provider or pharmacist if you have questions. COMMON BRAND NAME(S): Nuvigil What should I tell my health care provider before I take this medicine? They need to know if you have any of these conditions: -kidney disease -liver disease -an unusual or allergic reaction to armodafinil, modafinil, medicines, foods, dyes, or preservatives -pregnant or trying to get pregnant -breast-feeding How should I use this medicine? Take this medicine by mouth with a glass of water. Follow the directions on the prescription label. Take your doses at regular intervals. Do not take your medicine more often than directed. Do not stop taking except on your doctor's advice. A special MedGuide will be given to you by the pharmacist with each prescription and refill. Be sure to read this information carefully each time. Talk to your pediatrician regarding the use of this medicine in children. While this drug may be prescribed for children as young as 17 years of age for selected conditions, precautions do apply. Overdosage: If you think you have taken too much of this medicine contact a poison control center or emergency room at once. NOTE: This medicine is only for you. Do not share this medicine with others. What if I miss a dose? If you miss a dose, take it as soon as you can. If it is almost time for your next dose, take only that dose. Do not take double or extra doses. What may interact with this medicine? Do not take this medicine with any of the following medications: -amphetamine or dextroamphetamine -dexmethylphenidate or methylphenidate -MAOIs like Carbex, Eldepryl, Marplan, Nardil, and Parnate -pemoline -procarbazine This  medicine may also interact with the following medications: -antifungal medicines like itraconazole or ketoconazole -barbiturates, like phenobarbital -birth control pills or other hormone-containing birth control devices or implants -carbamazepine -cyclosporine -diazepam -medicines for depression, anxiety, or psychotic disturbances -phenytoin -propranolol -triazolam -warfarin This list may not describe all possible interactions. Give your health care provider a list of all the medicines, herbs, non-prescription drugs, or dietary supplements you use. Also tell them if you smoke, drink alcohol, or use illegal drugs. Some items may interact with your medicine. What should I watch for while using this medicine? Visit your doctor or health care professional for regular checks on your progress. The full effect of this medicine may not be seen right away. This medicine may affect your concentration, function, or may hide signs that you are tired. You may get dizzy. Do not drive, use machinery, or do anything that needs mental alertness until you know how this drug affects you. Alcohol can make you more dizzy and may interfere with your response to this medicine or your alertness. Avoid alcoholic drinks. Birth control pills may not work properly while you are taking this medicine. Talk to your doctor about using an extra method of birth control. It is unknown if the effects of this medicine will be increased by the use of caffeine. Caffeine is found in many foods, beverages, and medications. Ask your doctor if you should limit or change your intake of caffeine-containing products while on this medicine. What side effects may I notice from receiving this medicine? Side effects that you should report to your doctor or health care professional as soon as possible: -allergic reactions like skin   rash, itching or hives, swelling of the face, lips, or tongue -breathing problems -chest pain -fast, irregular  heartbeat -increased blood pressure, particularly if you have high blood pressure -mental problems -sore throat, fever, or chills -tremors -vomiting Side effects that usually do not require medical attention (report to your doctor or health care professional if they continue or are bothersome): -headache -nausea, diarrhea, or stomach upset -nervousness -trouble sleeping This list may not describe all possible side effects. Call your doctor for medical advice about side effects. You may report side effects to FDA at 1-800-FDA-1088. Where should I keep my medicine? Keep out of the reach of children. Store at room temperature between 20 and 25 degrees C (68 and 77 degrees F). Throw away any unused medicine after the expiration date. NOTE: This sheet is a summary. It may not cover all possible information. If you have questions about this medicine, talk to your doctor, pharmacist, or health care provider.  2015, Elsevier/Gold Standard. (2009-07-25 21:10:28)  

## 2014-04-15 NOTE — Progress Notes (Addendum)
[facility]   SLEEP MEDICINE CLINIC   Provider:  Melvyn Cobb, M D  Referring Provider: Duane Lope, MD  HISTORY OF PRESENT ILLNESS: Interval history :   Primary Care Physician:   Jeffrey Lope, MD   Chief Complaint  Patient presents with  . Follow-up    Room 10  . Sleep Apnea    HPI:  Jeffrey Cobb is a 73 y.o. male , who  seen here as a referral form Jeffrey Angel NP, and in  revisit  from Jeffrey Cobb for follow up on a recent sleep study:  Interval history; the patient was referred for a sleep study on 02-10-14. His AHI during study was 40.6 and his RDI of 48.3. 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 His lowest oxygen saturation was 80% but he accumulated 100 minutes of sleep in desaturation en toto .  The patient had a regular rate normal sinus rhythm, and no significant periodic limb movements.  He was titrated to CPAP and seemed to do best at 10 cm water pressure his oxygen nadir. 89% at the setting the AHI was 0.0 he slept for 35 minutes at this pressure setting but without any REM sleep.  A Respironics mask (nasal model )PICO  in large size was used to. The patient is here today for his first followup after sleep study and to him for any questions regarding the sleep study.  The patient reports, how much he relies on Mirapex to be able to fall asleep, but once he sleeps there seems to be no impact from RLS. He has no nocturia before or after CPAP. He has regular bed times; he goes to bed at midnight, and will fall asleep in one minute ! wakes up once for the  bathroom at 2-3 AM and goes back to sleep, rises in AM 7.30 , spontaneously .  Feels not more rested or restored on CPAP in spite of the better sleep architecture. He has rhinitis.   The CPAP compliance was 100% of days for the last 43 days, and 98% per year with all for 4 hours at night.  04-14-14, Average user time  was 5 hours 47  minutes,  CPAP is set at 10 cm water pressure with an EPR of 2 cm water and  residual AHI is 3.3 per hour of sleep.     CM: Jeffrey Cobb, 73 year old male returns for followup. He has a history of restless leg syndrome and sensory dysesthesias. He has also had chronic insomnia. He had a sleep study in 2008 which did not show obstructive sleep apnea, however he tells me today that this is still a problem for him and there are nights when he cannot go to sleep until 6:00 in the morning.  He has some daytime drowsiness. On the nights that he does sleep well he is refreshed. His primary care Jeffrey Cobb wants to switch him to a sleeping agent however he would like a sleep study done first according to the patient. Also the patient does state that he snores but he is not sure if he quits breathing at night. He returns for reevaluationof sensory dysesthesias, restless legs and insomnia.  MRI of the spine (lumbar) that he had done in April of 2009 showed moderate to severe spinal stenosis. He had lumbar spine decompression and fusion by Dr. Gerlene Cobb in 2011, did very well. He also has a history of right and left knee  replacements and a shoulder replacement. He is currently taking Mirapex for his restless legs tolerating the medication without drowsiness or any type of compulsive behaviors. iron profile was normal.      Review of Systems: Out of a complete 14 system review, the patient complains of only the following symptoms, and all other reviewed systems are negative. RLS, fatigue, one nocturia,  Epworth score 10 , Fatigue severity score 44  , Geriatric depression score 2,   History   Social History  . Marital Status: Married    Spouse Name: Jeffrey Cobb     Number of Children: 2  . Years of Education: Assoc    Occupational History  . Retired    Social History Main Topics  . Smoking status: Former Smoker -- 3.00 packs/day for 17 years    Types: Cigarettes    Quit date: 08/26/1977  . Smokeless  tobacco: Never Used  . Alcohol Use: Yes     Comment: rarely  . Drug Use: No  . Sexual Activity: Not on file   Other Topics Concern  . Not on file   Social History Narrative   Patient is married Jeffrey Cobb) and lives at home with his wife.   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    Patient is left handed   Patient drinks 1-2 cups of coffee in the morning and 3-4 cups of tea daily.                Family History  Problem Relation Age of Onset  . Emphysema Mother   . Heart disease Mother   . Heart disease Father   . Allergies      FH: FATHER,2 SISTER,1 BROTHER, SON  DAUGHTER  . Cancer Sister   . Alzheimer's disease Mother     Past Medical History  Diagnosis Date  . Occlusion and stenosis of carotid artery without mention of cerebral infarction   . Obstructive chronic bronchitis with exacerbation   . Other emphysema   . Mixed hyperlipidemia   . Persistent disorder of initiating or maintaining sleep   . Acute sinusitis, unspecified   . Complication of anesthesia     has problems sleeping after anesthesia  . GERD (gastroesophageal reflux disease)   . Arthritis   . Sleep apnea     Past Surgical History  Procedure Laterality Date  . Spine surgery  2000/2011  . Appendectomy  1956  . Right ankle reconstruction  1986  . Ruptured disk  2000  . Left knee replacement  2001  . Right knee replacement  2005  . Joint replacement Right     shoulder replacement  . Eye surgery Bilateral     cataract removal  . Torn retina    . Total shoulder arthroplasty Left 06/30/2013    Procedure: TOTAL SHOULDER ARTHROPLASTY;  Surgeon: Jeffrey Ave, MD;  Location: [facility] OR;  Service: Orthopedics;  Laterality: Left;    Current Outpatient Prescriptions  Medication Sig Dispense Refill  . Ascorbic Acid (VITAMIN C PO) Take by mouth.      Marland Kitchen aspirin EC 81 MG tablet Take 1 tablet (81 mg total) by mouth daily.  90 tablet  3  . Cholecalciferol (VITAMIN D) 2000  UNITS CAPS Take by mouth.      . Cinnamon 500 MG TABS Take by mouth.      . diclofenac sodium (VOLTAREN) 1 % GEL Apply topically as needed.      Marland Kitchen  doxazosin (CARDURA) 2 MG tablet Take 2 mg by mouth daily.       Marland Kitchen. gabapentin (NEURONTIN) 300 MG capsule Take 1 capsule (300 mg total) by mouth 2 (two) times daily.  60 capsule  3  . LEVITRA 20 MG tablet Take 20 mg by mouth daily as needed for erectile dysfunction.       Jodelle Green. Mag Oxide-Vit D3-Turmeric (MAGNESIUM-VITAMIN D3-TURMERIC) [id number] MG-UNIT-MG CAPS Take by mouth.      . methylPREDNISolone (MEDROL) 16 MG tablet Take 2 tablets daily x 2 days, 1 tab daily x 2 days, 1/2 tab daily x 2 days, then stop.  7 tablet  0  . Multiple Vitamin (MULTI VITAMIN DAILY PO) Take by mouth.      . Omega-3 Fatty Acids (OMEGA-3 FISH OIL) 300 MG CAPS Take by mouth.      . pantoprazole (PROTONIX) 40 MG tablet Take 1 tablet (40 mg total) by mouth 2 (two) times daily.  30 tablet  11  . pramipexole (MIRAPEX) 0.5 MG tablet TAKE 2 TABLETS BY MOUTH EVERY DAY  180 tablet  3  . PROAIR HFA 108 (90 BASE) MCG/ACT inhaler Inhale 2 puffs into the lungs every 6 (six) hours as needed.      . traMADol (ULTRAM) 50 MG tablet Take 1 tablet (50 mg total) by mouth every 6 (six) hours as needed.  30 tablet  0   No current facility-administered medications for this visit.    Allergies as of 04/15/2014 - Review Complete 04/15/2014  Allergen Reaction Noted  . Sulfonamide derivatives Rash   . Trazodone and nefazodone Rash and Other (See Comments) 11/16/2010    Vitals: BP 120/78  Pulse 75  Resp 16  Ht 5' 7.5" (1.715 m)  Wt 207 lb (93.895 kg)  BMI 31.92 kg/m2 Last Weight:  Wt Readings from Last 1 Encounters:  04/15/14 207 lb (93.895 kg)       Last Height:   Ht Readings from Last 1 Encounters:  04/15/14 5' 7.5" (1.715 m)    Physical exam:  General: The patient is awake, alert and appears not in acute distress. The patient is well groomed. Head: Normocephalic, atraumatic. Neck  is supple. Mallampati 3,  neck circumference:17 inches . Nasal airflow unrestricted - TMJ is  Not evident . Retrognathia is not seen.  Cardiovascular:  Regular rate and rhythm , without  murmurs or carotid bruit, and without distended neck veins. Respiratory: Lungs are clear to auscultation. Skin:  Without evidence of edema, or rash Trunk: BMI is   elevated and patient  has normal posture.  Neurologic exam : The patient is awake and alert, oriented to place and time.   Memory subjective  described as intact. There is a normal attention span & concentration ability. Speech is fluent without   dysarthria, dysphonia or aphasia. Mood and affect are appropriate.  Cranial nerves: Pupils are equal status post cataract surgery . Funduscopic exam without  evidence of pallor or edema. Extraocular movements  in vertical and horizontal planes intact and without nystagmus. Visual fields by finger perimetry are intact. Hearing to finger rub intact.  Facial sensation intact to fine touch. Facial motor strength is symmetric and tongue and uvula move midline.  Motor exam:   Normal tone ,muscle bulk  in all extremities.  Sensory:  Fine touch, pinprick and vibration were tested in all extremities. Proprioception is  normal.  Coordination: Rapid alternating movements in the fingers/hands is normal. Finger-to-nose maneuver  normal without evidence of ataxia, dysmetria or  tremor.  Gait and station: Patient walks without assistive device  Deep tendon reflexes: in the  upper extremities are symmetric and intact. Lower extremities - loss  of patella reflex after bilateral knee surgery   Assessment:  After physical and neurologic examination, review of laboratory studies, imaging, neurophysiology testing and pre-existing records, assessment is  1) RLS well controlled on MIRAPEX. Refilled today. 2) OSA , diagnosed at AHI over 40- severe controlled on 9 CM water with high compliance , residual AHI of 3.3 , but little  effect on EDS. 3) persistent fatigue and EDS , epworth 10 , FSS 44 points . No depression by GDS.  Medication side effect of Mirapex bid ?  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   The patient was advised of the nature of the diagnosed sleep disorder , the treatment options and risks for general a health and wellness arising from not treating the condition. Visit duration was 45 minutes.  Established patient at [facility] with a new problem.  Education 15 minutes .  Plan:  Treatment plan and additional workup :  Lower AM dose of Mirapex, generic and higher PM dose . Hope to decrease the fatigue. Checked for Vit D , testosterone levels and ferritin by PCP  to further evaluate fatigue 3 years ago. Next general physical in November.  Nuvigil samples for AM fatigue and EDS in OSA patient.  Continue with CPAP at current settings. RV for sleep clinic follow up in 12 month with CM       Jeffrey Novas MD  04/15/2014

## 2014-04-17 NOTE — Progress Notes (Signed)
Subjective:     Patient ID: Jeffrey Cobb, male   DOB: [birth year], 73 y.o.   MRN: [id number]  Foot Pain   patient presents stating he feels strange sensations on the bottom of both feet and it feels at times like nerve endings and it feels like he is walking on sandpaper   Review of Systems  All other systems reviewed and are negative.      Objective:   Physical Exam  Nursing note and vitals reviewed. Constitutional: He is oriented to person, place, and time.  Cardiovascular: Intact distal pulses.   Musculoskeletal: Normal range of motion.  Neurological: He is oriented to person, place, and time.  Skin: Skin is warm and dry.   neurovascular status is found to be mildly reduced upon sharp dull and vibratory and I noted that there is some contracture of the lesser digits. 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 At this point we'll start him on low-dose Neurontin and advised that if the causes drowsiness or other types of disturbances he is to stop taking it if not I would like him to take one a day for the first 2 weeks and then start to a day to toleration. We will reevaluate again in 6 weeks and decide what may be appropriate

## 2014-04-18 ENCOUNTER — Ambulatory Visit (INDEPENDENT_AMBULATORY_CARE_PROVIDER_SITE_OTHER): Payer: Medicare Other | Admitting: Cardiology

## 2014-04-18 ENCOUNTER — Encounter: Payer: Self-pay | Admitting: Cardiology

## 2014-04-18 VITALS — BP 102/88 | HR 71 | Ht 67.0 in | Wt 206.0 lb

## 2014-04-18 DIAGNOSIS — I6529 Occlusion and stenosis of unspecified carotid artery: Secondary | ICD-10-CM

## 2014-04-18 NOTE — Patient Instructions (Signed)
Your physician has requested that you have a carotid duplex in 1 year prior to OV (cancel appt for this year and r/s for next year). This test is an ultrasound of the carotid arteries in your neck. It looks at blood flow through these arteries that supply the brain with blood. Allow one hour for this exam. There are no restrictions or special instructions.  Your physician wants you to follow-up in: 1 year with Dr. Delton See. You will receive a reminder letter in the mail two months in advance. If you don't receive a letter, please call our office to schedule the follow-up appointment.

## 2014-04-18 NOTE — Progress Notes (Signed)
Patient ID: ARTH NICASTRO, male   DOB: 11-13-1940, 73 y.o.   MRN: [id number]     Patient Name: Jeffrey Cobb Date of Encounter: 04/18/2014  Primary Care Provider:   Duane Lope, MD Primary Cardiologist:  Lars Masson (Dr Daleen Squibb)  Problem List   Past Medical History  Diagnosis Date  . Occlusion and stenosis of carotid artery without mention of cerebral infarction   . Obstructive chronic bronchitis with exacerbation   . Other emphysema   . Mixed hyperlipidemia   . Persistent disorder of initiating or maintaining sleep   . Acute sinusitis, unspecified   . Complication of anesthesia     has problems sleeping after anesthesia  . GERD (gastroesophageal reflux disease)   . Arthritis   . Sleep apnea    Past Surgical History  Procedure Laterality Date  . Spine surgery  2000/2011  . Appendectomy  1956  . Right ankle reconstruction  1986  . Ruptured disk  2000  . Left knee replacement  2001  . Right knee replacement  2005  . Joint replacement Right     shoulder replacement  . Eye surgery Bilateral     cataract removal  . Torn retina    . Total shoulder arthroplasty Left 06/30/2013    Procedure: TOTAL SHOULDER ARTHROPLASTY;  Surgeon: Loreta Ave, MD;  Location: [facility] OR;  Service: Orthopedics;  Laterality: Left;    Allergies  Allergies  Allergen Reactions  . Sulfonamide Derivatives Rash  . Trazodone And Nefazodone Rash and Other (See Comments)    Hallucinations     HPI  Mr Summerlin returns today for evaluation and management his carotid artery disease. Carotid Dopplers were stable in 05/2013. He denies symptoms of TIAs or mini strokes. He had shoulder sx in December 2014 without complications. He denies any CP, SOB, LE edema, palpitations.   He doesn't exercise on a regular basis, he is very complaint with his meds. His lipids have always been good he's never been on a statin.   Home Medications  Prior to Admission medications   Medication Sig Start Date  End Date Taking? Authorizing Provider  Ascorbic Acid (VITAMIN C PO) Take by mouth.   Yes Historical Provider, MD  aspirin EC 81 MG tablet Take 1 tablet (81 mg total) by mouth daily. 04/14/13  Yes Gaylord Shih, MD  Cholecalciferol (VITAMIN D) 2000 UNITS CAPS Take by mouth.   Yes Historical Provider, MD  Cinnamon 500 MG TABS Take by mouth.   Yes Historical Provider, MD  diclofenac sodium (VOLTAREN) 1 % GEL Apply topically as needed.   Yes Historical Provider, MD  doxazosin (CARDURA) 2 MG tablet Take 2 mg by mouth daily.  08/22/11  Yes Historical Provider, MD  gabapentin (NEURONTIN) 300 MG capsule Take 1 capsule (300 mg total) by mouth 2 (two) times daily. 04/14/14  Yes Kirstie Peri Regal, DPM  LEVITRA 20 MG tablet Take 20 mg by mouth daily as needed for erectile dysfunction.  06/11/11  Yes Historical Provider, MD  Mag Oxide-Vit D3-Turmeric (MAGNESIUM-VITAMIN D3-TURMERIC) [id number] MG-UNIT-MG CAPS Take by mouth.   Yes Historical Provider, MD  Multiple Vitamin (MULTI VITAMIN DAILY PO) Take by mouth.   Yes Historical Provider, MD  pantoprazole (PROTONIX) 40 MG tablet Take 1 tablet (40 mg total) by mouth 2 (two) times daily. 07/21/13  Yes Barbaraann Share, MD  pramipexole (MIRAPEX) 0.5 MG tablet Use a half tab in AM and 1.5 tabs in PM po. 04/15/14  Yes Melvyn Novas, MD  PROAIR HFA 108 (90 BASE) MCG/ACT inhaler Inhale 2 puffs into the lungs every 6 (six) hours as needed. 01/28/14  Yes Historical Provider, MD  traMADol (ULTRAM) 50 MG tablet Take 1 tablet (50 mg total) by mouth every 6 (six) hours as needed. 04/07/14  Yes Barbaraann Share, MD  Omega-3 Fatty Acids (OMEGA-3 FISH OIL) 300 MG CAPS Take by mouth.    Historical Provider, MD    Family History  Family History  Problem Relation Age of Onset  . Emphysema Mother   . Heart disease Mother   . Heart disease Father   . Allergies      FH: FATHER,2 SISTER,1 BROTHER, SON  DAUGHTER  . Cancer Sister   . Alzheimer's disease Mother     Social  History  History   Social History  . Marital Status: Married    Spouse Name: Myriam Jacobson     Number of Children: 2  . Years of Education: Assoc    Occupational History  . Retired    Social History Main Topics  . Smoking status: Former Smoker -- 3.00 packs/day for 17 years    Types: Cigarettes    Quit date: 08/26/1977  . Smokeless tobacco: Never Used  . Alcohol Use: Yes     Comment: rarely  . Drug Use: No  . Sexual Activity: Not on file   Other Topics Concern  . Not on file   Social History Narrative   Patient is married Myriam Jacobson) and lives at home with his wife.   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    Patient is left handed   Patient drinks 1-2 cups of coffee in the morning and 3-4 cups of tea daily.                 Review of Systems, as per HPI, otherwise negative General:  No chills, fever, night sweats or weight changes.  Cardiovascular:  No chest pain, dyspnea on exertion, edema, orthopnea, palpitations, paroxysmal nocturnal dyspnea. Dermatological: No rash, lesions/masses Respiratory: No cough, dyspnea Urologic: No hematuria, dysuria Abdominal:   No nausea, vomiting, diarrhea, bright red blood per rectum, melena, or hematemesis Neurologic:  No visual changes, wkns, changes in mental status. All other systems reviewed and are otherwise negative except as noted above.  Physical Exam  Blood pressure 102/88, pulse 71, height  (1.702 m), weight 206 lb (93.441 kg).  General: Pleasant, NAD Psych: Normal affect. Neuro: Alert and oriented X 3. Moves all extremities spontaneously. HEENT: Normal  Neck: Supple without bruits or JVD. Lungs:  Resp regular and unlabored, CTA. Heart: RRR no s3, s4, or murmurs. Abdomen: Soft, non-tender, non-distended, BS + x 4.  Extremities: No clubbing, cyanosis or edema. DP/PT/Radials 2+ and equal bilaterally.  Labs:  No results found for this basename: CKTOTAL, CKMB, TROPONINI,  in the last  72 hours Lab Results  Component Value Date   WBC 10.3 06/30/2013   HGB 12.8* 07/01/2013   HCT 37.1* 07/01/2013   MCV 91.6 06/30/2013   PLT 126* 06/30/2013    No results found for this basename: DDIMER   No components found with this basename: POCBNP,     Component Value Date/Time   NA 132* 07/01/2013 0447   K 3.8 07/01/2013 0447   CL 98 07/01/2013 0447   CO2 22 07/01/2013 0447   GLUCOSE 132* 07/01/2013 0447   BUN 13 07/01/2013 0447   CREATININE 1.05 07/01/2013 0447   CALCIUM  8.5 07/01/2013 0447   PROT 7.6 06/22/2013 1053   ALBUMIN 3.7 06/22/2013 1053   AST 21 06/22/2013 1053   ALT 14 06/22/2013 1053   ALKPHOS 82 06/22/2013 1053   BILITOT 0.3 06/22/2013 1053   GFRNONAA 69* 07/01/2013 0447   GFRAA 80* 07/01/2013 0447   Lab Results  Component Value Date   CHOL 165 01/17/2011   HDL 67.40 01/17/2011   LDLCALC 90 01/17/2011   TRIG 40.0 01/17/2011    Accessory Clinical Findings  Echocardiogram - none  ECG - SR, 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 CAD risk stratification - unchanged ECG, asymptomatic, no further work up  3. Lipids - followed by PCP  Follow up in 1 year.  Lars Masson, MD, [facility] 04/18/2014, 11:47 AM

## 2014-04-26 ENCOUNTER — Telehealth: Payer: Self-pay | Admitting: *Deleted

## 2014-04-26 ENCOUNTER — Other Ambulatory Visit: Payer: Self-pay | Admitting: Pulmonary Disease

## 2014-04-26 NOTE — Telephone Encounter (Signed)
Ok to give #30 with no fills.

## 2014-04-26 NOTE — Telephone Encounter (Signed)
I received a refill requests for the pt tramadol. Rx was written on 04-07-14 take 1 tablet every 6 hours prn #30 0 refills. Please advise if ok to refill. Carron Curie, CMA Allergies  Allergen Reactions  . Sulfonamide Derivatives Rash  . Trazodone And Nefazodone Rash and Other (See Comments)    Hallucinations

## 2014-04-27 NOTE — Telephone Encounter (Signed)
Refill sent. Jennifer Castillo, CMA  

## 2014-04-28 ENCOUNTER — Other Ambulatory Visit: Payer: Self-pay | Admitting: Pulmonary Disease

## 2014-05-23 ENCOUNTER — Other Ambulatory Visit: Payer: Self-pay | Admitting: *Deleted

## 2014-05-23 MED ORDER — PANTOPRAZOLE SODIUM 40 MG PO TBEC: 40.0000 mg | DELAYED_RELEASE_TABLET | Freq: Two times a day (BID) | ORAL | Status: DC

## 2014-05-26 ENCOUNTER — Encounter: Payer: Self-pay | Admitting: Podiatry

## 2014-05-26 ENCOUNTER — Ambulatory Visit (INDEPENDENT_AMBULATORY_CARE_PROVIDER_SITE_OTHER): Payer: Medicare Other | Admitting: Podiatry

## 2014-05-26 VITALS — BP 126/85 | HR 83 | Resp 16

## 2014-05-26 DIAGNOSIS — G629 Polyneuropathy, unspecified: Secondary | ICD-10-CM

## 2014-05-27 NOTE — Progress Notes (Signed)
Subjective:     Patient ID: Jeffrey Cobb, male   DOB: 05/12/1941, 73 y.o.   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

## 2014-06-07 ENCOUNTER — Encounter: Payer: Self-pay | Admitting: Neurology

## 2014-06-10 ENCOUNTER — Other Ambulatory Visit: Payer: Self-pay

## 2014-06-14 ENCOUNTER — Other Ambulatory Visit (HOSPITAL_COMMUNITY): Payer: Medicare Other

## 2014-07-15 ENCOUNTER — Encounter: Payer: Self-pay | Admitting: Nurse Practitioner

## 2014-09-02 ENCOUNTER — Encounter: Payer: Self-pay | Admitting: Nurse Practitioner

## 2014-09-02 ENCOUNTER — Ambulatory Visit (INDEPENDENT_AMBULATORY_CARE_PROVIDER_SITE_OTHER): Payer: Medicare Other | Admitting: Nurse Practitioner

## 2014-09-02 ENCOUNTER — Ambulatory Visit (INDEPENDENT_AMBULATORY_CARE_PROVIDER_SITE_OTHER)
Admission: RE | Admit: 2014-09-02 | Discharge: 2014-09-02 | Disposition: A | Discharge status: Home / Self Care | Payer: Medicare Other | Source: Ambulatory Visit | Attending: Nurse Practitioner | Admitting: Nurse Practitioner

## 2014-09-02 ENCOUNTER — Ambulatory Visit
Admission: RE | Admit: 2014-09-02 | Discharge: 2014-09-02 | Disposition: A | Discharge status: Home / Self Care | Payer: Medicare Other | Source: Ambulatory Visit | Attending: Nurse Practitioner | Admitting: Nurse Practitioner

## 2014-09-02 ENCOUNTER — Other Ambulatory Visit: Payer: Self-pay | Admitting: *Deleted

## 2014-09-02 VITALS — BP 130/82 | HR 72 | Ht 67.0 in | Wt 200.1 lb

## 2014-09-02 DIAGNOSIS — I739 Peripheral vascular disease, unspecified: Secondary | ICD-10-CM

## 2014-09-02 DIAGNOSIS — R0609 Other forms of dyspnea: Principal | ICD-10-CM

## 2014-09-02 DIAGNOSIS — E782 Mixed hyperlipidemia: Secondary | ICD-10-CM

## 2014-09-02 DIAGNOSIS — I2699 Other pulmonary embolism without acute cor pulmonale: Secondary | ICD-10-CM

## 2014-09-02 DIAGNOSIS — I779 Disorder of arteries and arterioles, unspecified: Secondary | ICD-10-CM

## 2014-09-02 LAB — CBC WITH DIFFERENTIAL/PLATELET
BASOS ABS: 0 10*3/uL (ref 0.0–0.1)
Basophils Relative: 0.5 % (ref 0.0–3.0)
EOS PCT: 2.5 % (ref 0.0–5.0)
Eosinophils Absolute: 0.2 10*3/uL (ref 0.0–0.7)
HEMATOCRIT: 44.6 % (ref 39.0–52.0)
Hemoglobin: 14.6 g/dL (ref 13.0–17.0)
Lymphocytes Relative: 26.2 % (ref 12.0–46.0)
Lymphs Abs: 2.2 10*3/uL (ref 0.7–4.0)
MCHC: 32.8 g/dL (ref 30.0–36.0)
MCV: 92.3 fl (ref 78.0–100.0)
Monocytes Absolute: 0.7 10*3/uL (ref 0.1–1.0)
Monocytes Relative: 8.7 % (ref 3.0–12.0)
NEUTROS ABS: 5.1 10*3/uL (ref 1.4–7.7)
NEUTROS PCT: 62.1 % (ref 43.0–77.0)
PLATELETS: 139 10*3/uL — AB (ref 150.0–400.0)
RBC: 4.83 Mil/uL (ref 4.22–5.81)
RDW: 16.1 % — ABNORMAL HIGH (ref 11.5–15.5)
WBC: 8.2 10*3/uL (ref 4.0–10.5)

## 2014-09-02 LAB — BASIC METABOLIC PANEL
BUN: 10 mg/dL (ref 6–23)
CHLORIDE: 103 meq/L (ref 96–112)
CO2: 26 meq/L (ref 19–32)
Calcium: 8.9 mg/dL (ref 8.4–10.5)
Creatinine, Ser: 1.1 mg/dL (ref 0.4–1.5)
GFR: 68.84 mL/min (ref >60.00)
GLUCOSE: 86 mg/dL (ref 70–99)
Potassium: 3.8 mEq/L (ref 3.5–5.1)
SODIUM: 136 meq/L (ref 135–145)

## 2014-09-02 LAB — D-DIMER, QUANTITATIVE: D-Dimer, Quant: 1.03 ug/mL-FEU — ABNORMAL HIGH (ref 0.00–0.48)

## 2014-09-02 LAB — BRAIN NATRIURETIC PEPTIDE: Pro B Natriuretic peptide (BNP): 25 pg/mL (ref 0.0–100.0)

## 2014-09-02 MED ORDER — PANTOPRAZOLE SODIUM 40 MG PO TBEC: 40.0000 mg | DELAYED_RELEASE_TABLET | Freq: Two times a day (BID) | ORAL | Status: DC

## 2014-09-02 MED ORDER — IOHEXOL 350 MG/ML SOLN
80.0000 mL | Freq: Once | INTRAVENOUS | Status: AC | PRN
  Administered 2014-09-02: 80 mL via INTRAVENOUS

## 2014-09-02 NOTE — Patient Instructions (Signed)
Your physician recommends that you continue on your current medications as directed. Please refer to the Current Medication list given to you today.  A chest x-ray takes a picture of the organs and structures inside the chest, including the heart, lungs, and blood vessels. This test can show several things, including, whether the heart is enlarges; whether fluid is building up in the lungs; and whether pacemaker / defibrillator leads are still in place.  Your physician has requested that you have an echocardiogram. Echocardiography is a painless test that uses sound waves to create images of your heart. It provides your doctor with information about the size and shape of your heart and how well your heart's chambers and valves are working. This procedure takes approximately one hour. There are no restrictions for this procedure.   Your physician has requested that you have a lexiscan myoview. For further information please visit [email or web address]. Please follow instruction sheet, as given.  Your physician recommends that you have lab work today;bmet/cbc/bnp/ddimer  Your physician recommends that you keep your scheduled follow-up appointment with Dr. Delton SeeNelson.

## 2014-09-02 NOTE — Addendum Note (Signed)
Addended by: Nicolasa DuckingBERGE, [place] Rogacki R on: 09/02/2014 04:49 PM   Modules accepted: Level of Service

## 2014-09-02 NOTE — Progress Notes (Addendum)
Patient Name: Jeffrey Cobb Date of Encounter: 09/02/2014  Primary Care Provider:   Duane Lopeoss, Alan, MD Primary Cardiologist:  Eloy EndK. Nelson, MD   Patient Profile  74 y/o male with a h/o COPD, HL, and recent persistent cough and DOE who presents for eval.  Problem List   Past Medical History  Diagnosis Date  . Occlusion and stenosis of carotid artery without mention of cerebral infarction     a. 05/2013 Carotid U/S: 1-39% bilat stenosis.  . Obstructive chronic bronchitis with exacerbation   . Other emphysema   . Mixed hyperlipidemia   . Persistent disorder of initiating or maintaining sleep   . Acute sinusitis, unspecified   . GERD (gastroesophageal reflux disease)   . Arthritis   . Sleep apnea     a. on CPAP.   Past Surgical History  Procedure Laterality Date  . Spine surgery  2000/2011  . Appendectomy  1956  . Right ankle reconstruction  1986  . Ruptured disk  2000  . Left knee replacement  2001  . Right knee replacement  2005  . Joint replacement Right     shoulder replacement  . Eye surgery Bilateral     cataract removal  . Torn retina    . Total shoulder arthroplasty Left 06/30/2013    Procedure: TOTAL SHOULDER ARTHROPLASTY;  Surgeon: Loreta Aveaniel F Murphy, MD;  [facility];  Service: Orthopedics;  Laterality: Left;    Allergies  Allergies  Allergen Reactions  . Sulfonamide Derivatives Rash  . Trazodone And Nefazodone Rash and Other (See Comments)    Hallucinations     HPI  74 y/o male with the above problem list.  He is followed here 2/2 non-obstructive bilat carotid dzs and is due for f/u U/S later this year.  He also has a h/o COPD for which he is on prn albuterol only.  He was dx with severe OSA last summer and was placed on CPAP using a nasal pillow (couldn't tolerate mask 2/2 claustrophobia).  Unfortunately, he feels that the nasal pillow causes him nasal pressure and stuffiness, and thus he hasn't been using that either.  He says that beginning in ~ late  October (though looking back further, notes from pulmonology show that he had similar Ss beginning in August), he noted increased dry, hacking cough without fever or chills.  He also noted mild DOE. He took some amoxicillin left over from prior dental visit and says that he took 1 tab bid for 10 days w/o much impact on either the cough or dyspnea.  He saw his PCP in November and was placed on doxycycline and also given a depmedrol inj.  Despite these interventions, his cough has continued and his dyspnea has progressed.  Now, he can only walk about 50 yds or so prior to having to stop and rest.  He has not had any chest pain and denies any h/o pnd, orthopnea, n, v, dizziness, syncope, or early satiety.  At one point in Nov or December, his wife noted mild lower ext swelling but this has since resolved without intervention.  He saw his PCP on 12/31 and due to ongoing Ss, he was set up to see me today.   Home Medications  Prior to Admission medications   Medication Sig Start Date End Date Taking? Authorizing Provider  diclofenac sodium (VOLTAREN) 1 % GEL Apply topically as needed.   Yes Historical Provider, MD  doxazosin (CARDURA) 2 MG tablet Take 2 mg by mouth daily.  08/22/11  Yes Historical Provider, MD  fluticasone (FLONASE) 50 MCG/ACT nasal spray Place 50 sprays into both nostrils as needed. 05/31/14  Yes Historical Provider, MD  LEVITRA 20 MG tablet Take 20 mg by mouth daily as needed for erectile dysfunction.  06/11/11  Yes Historical Provider, MD  pantoprazole (PROTONIX) 40 MG tablet Take 1 tablet (40 mg total) by mouth 2 (two) times daily. 05/23/14  Yes Barbaraann Share, MD  pramipexole (MIRAPEX) 0.5 MG tablet Use a half tab in AM and 1.5 tabs in PM po. 04/15/14  Yes Melvyn Novas, MD  PROAIR HFA 108 (90 BASE) MCG/ACT inhaler Inhale 2 puffs into the lungs every 6 (six) hours as needed. 01/28/14  Yes Historical Provider, MD  zolpidem (AMBIEN) 10 MG tablet Take 10 mg by mouth at bedtime. 08/26/14  Yes  Historical Provider, MD  Ascorbic Acid (VITAMIN C PO) Take by mouth.    Historical Provider, MD  aspirin EC 81 MG tablet Take 1 tablet (81 mg total) by mouth daily. Patient not taking: Reported on 09/02/2014 04/14/13   Gaylord Shih, MD   Review of Systems  DOE and dry cough as above.  He denies chest pain, palpitations, pnd, orthopnea, n, v, dizziness, syncope, edema, weight gain, or early satiety.  All other systems reviewed and are otherwise negative except as noted above.  Physical Exam  Blood pressure 130/82, pulse 72, height  (1.702 m), weight 200 lb 1.9 oz (90.774 kg).  General: Pleasant, NAD Psych: Normal affect. Neuro: Alert and oriented X 3. Moves all extremities spontaneously. HEENT: Normal  Neck: Supple without bruits or JVD. Lungs:  Resp regular and unlabored, CTA. Heart: RRR no s3, s4, or murmurs. Abdomen: Soft, non-tender, non-distended, BS + x 4.  Extremities: No clubbing, cyanosis or edema. DP/PT/Radials 2+ and equal bilaterally.  Accessory Clinical Findings  ECG - RSR, 72, LAD, LAFB - no acute st/t changes.  Assessment & Plan  1.  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  His dyspnea has persisted despite two rounds of abx and a steroid inj in December.  He now experienced DOE after walking only about 50 yds, though he has no h/o chest pain, pnd, orthopnea, or early satiety.  His wt has been steady.  He has no evidence of volume overload on exam.  His lungs are clear w/o evidence of wheezing.  I will check a cbc, bmet, pBNP, d dimer (recent travel to/from [place]), and CXR today.  He has never had either an echo or a stress test and we will arrange for both of these to be performed to assess LV fxn, PA pressure (severe OSA), and r/o ischemia as a possible contributor.  If all tests are wnl, he  will need referral back to pulmonology (sees Dr. Shelle Iron).  2.  Non-obstructive bilateral carotid arterial disease:  1-39% bilat stenosis on last U/S in 05/2013 with plan for f/u U/S this year.  3.  Dispo:  F/U studies as above.  F/U with Dr. Delton See in 2-4 wks to review.   Nicolasa Ducking, NP 09/02/2014, 11:44 AM  Pts d dimer returned elevated @ 1.03.  CBC/bmet wnl.  CTA of the chest was performed and showed:  IMPRESSION: 1. No evidence of pulmonary embolism. 2. The appearance of the lungs could suggest interstitial lung disease. Specifically, the appearance is  suspicious for potential nonspecific interstitial pneumonia (NSIP). Repeat evaluation with high-resolution chest CT in 6 months is suggested to assess for temporal changes in the appearance of the lung parenchyma. 3. Atherosclerosis, including left main and left anterior descending coronary artery disease. Assessment for potential risk factor modification, dietary therapy or pharmacologic therapy may be warranted, if clinically indicated.  Results discussed with patient.  Rec pulm f/u re: CT findings and f/u CT in 6 months @ pulmonary discretion.

## 2014-09-05 ENCOUNTER — Other Ambulatory Visit: Payer: Medicare Other

## 2014-09-06 ENCOUNTER — Encounter: Payer: Self-pay | Admitting: Cardiology

## 2014-09-07 ENCOUNTER — Encounter: Payer: Self-pay | Admitting: Pulmonary Disease

## 2014-09-07 ENCOUNTER — Ambulatory Visit (INDEPENDENT_AMBULATORY_CARE_PROVIDER_SITE_OTHER): Payer: Medicare Other | Admitting: Pulmonary Disease

## 2014-09-07 ENCOUNTER — Ambulatory Visit (HOSPITAL_COMMUNITY): Payer: Medicare Other | Attending: Cardiovascular Disease | Admitting: Radiology

## 2014-09-07 ENCOUNTER — Other Ambulatory Visit (INDEPENDENT_AMBULATORY_CARE_PROVIDER_SITE_OTHER): Payer: Medicare Other

## 2014-09-07 VITALS — BP 144/80 | HR 90 | Temp 97.7°F | Wt 199.2 lb

## 2014-09-07 DIAGNOSIS — R0609 Other forms of dyspnea: Secondary | ICD-10-CM

## 2014-09-07 DIAGNOSIS — Z87891 Personal history of nicotine dependence: Secondary | ICD-10-CM | POA: Diagnosis not present

## 2014-09-07 DIAGNOSIS — R079 Chest pain, unspecified: Secondary | ICD-10-CM | POA: Diagnosis present

## 2014-09-07 DIAGNOSIS — R0602 Shortness of breath: Secondary | ICD-10-CM | POA: Insufficient documentation

## 2014-09-07 DIAGNOSIS — R0600 Dyspnea, unspecified: Secondary | ICD-10-CM | POA: Insufficient documentation

## 2014-09-07 LAB — SEDIMENTATION RATE: SED RATE: 15 mm/h (ref 0–22)

## 2014-09-07 MED ORDER — TECHNETIUM TC 99M SESTAMIBI GENERIC - CARDIOLITE
30.0000 | Freq: Once | INTRAVENOUS | Status: AC | PRN
  Administered 2014-09-07: 30 via INTRAVENOUS

## 2014-09-07 MED ORDER — TECHNETIUM TC 99M SESTAMIBI GENERIC - CARDIOLITE
10.0000 | Freq: Once | INTRAVENOUS | Status: AC | PRN
  Administered 2014-09-07: 10 via INTRAVENOUS

## 2014-09-07 MED ORDER — REGADENOSON 0.4 MG/5ML IV SOLN
0.4000 mg | Freq: Once | INTRAVENOUS | Status: AC
  Administered 2014-09-07: 0.4 mg via INTRAVENOUS

## 2014-09-07 NOTE — Progress Notes (Signed)
[facility] SITE [address] NUCLEAR MED [address] [place], [place] [address] [phone]    Cardiology Nuclear Med Study  Nance PewRufus B Cobb is a 74 y.o. male     MRN : [id number]     DOB: 07/24/1941  Procedure Date: 09/07/2014  Nuclear Med Background Indication for Stress Test:  Evaluation for Ischemia History:  no prior hx CAD; 2009 MPI-nml, EF 58%; COPD Cardiac Risk Factors: Carotid Disease and History of Smoking  Symptoms:  Chest Pain, DOE and SOB   Nuclear Pre-Procedure Caffeine/Decaff Intake:  None NPO After: 8:30pm   Lungs:  clear O2 Sat: 95% on room air. IV 0.9% NS with Angio Cath:  20g  IV Site: R Wrist  IV Started by:  Doyne Keelonya Yount, CNMT  Chest Size (in):  44 Cup Size: n/a  Height: 5\' 7"  (1.702 m)  Weight:  196 lb (88.905 kg)  BMI:  Body mass index is 30.69 kg/(m^2). Tech Comments:  n/a    Nuclear Med Study 1 or 2 day study: 1 day  Stress Test Type:  Lexiscan  Reading MD: Willa RoughJeffrey Louine Tenpenny, MD  Order Authorizing Provider:  Eloy EndK. Nelson, MD  Resting Radionuclide: Technetium 4356m Sestamibi  Resting Radionuclide Dose: 11.0 mCi   Stress Radionuclide:  Technetium 756m Sestamibi  Stress Radionuclide Dose: 33.0 mCi           Stress Protocol Rest HR: 68 Stress HR: 86  Rest BP: 131/85 Stress BP: 158/94  Exercise Time (min): n/a METS: n/a   Predicted Max HR: 147 bpm % Max HR: 58.5 bpm Rate Pressure Product: [id number]   Dose of Adenosine (mg):  n/a Dose of Lexiscan: 0.4 mg  Dose of Atropine (mg): n/a Dose of Dobutamine: n/a mcg/kg/min (at max HR)  Stress Test Technologist: Milana NaSabrina Williams, EMT-P  Nuclear Technologist:  Kerby NoraElzbieta Kubak, CNMT     Rest Procedure:  Myocardial perfusion imaging was performed at rest 45 minutes following the intravenous administration of Technetium 3756m Sestamibi. Rest ECG: Normal sinus rhythm. Normal EKG. Scattered PVCs.  Stress Procedure:  The patient received IV Lexiscan 0.4 mg over 15-seconds.  Technetium 7056m Sestamibi injected at 30-seconds.  This patient had sob with the Lexiscan injection. Quantitative spect images were obtained after a 45 minute delay. Stress ECG: No significant change from baseline ECG  QPS Raw Data Images:  Normal; no motion artifact; normal heart/lung ratio. Stress Images:  Normal homogeneous uptake in all areas of the myocardium. Rest Images:  Normal homogeneous uptake in all areas of the myocardium. Subtraction (SDS):  No evidence of ischemia. Transient Ischemic Dilatation (Normal <1.22):  0.98 Lung/Heart Ratio (Normal <0.45):  0.30  Quantitative Gated Spect Images QGS EDV:  97 ml QGS ESV:  42 ml  Impression Exercise Capacity:  Lexiscan with low level exercise. BP Response:  Normal blood pressure response. Clinical Symptoms:  Shortness of breath ECG Impression:  No significant ST segment change suggestive of ischemia. Comparison with Prior Nuclear Study:   The study is compared to the report of the study from 2009  Overall Impression:  Normal stress nuclear study.  This is a low risk scan. There is no scar or ischemia.  LV Ejection Fraction: 57%.  LV Wall Motion:  Normal Wall Motion   Willa RoughJeffrey Salsabeel Gorelick, MD

## 2014-09-07 NOTE — Patient Instructions (Signed)
Will schedule for breathing studies Will check a blood test that looks at inflammation level in body, and call you with results.

## 2014-09-07 NOTE — Assessment & Plan Note (Signed)
The patient has worsening dyspnea on exertion since October of last year, and it is unclear from his history and exam what may be causing this. He has had some increasing cough and upper chest congestion, but denies this being related to his sinus issues. He does have a history of chronic cough related to the irritable larynx syndrome, but he feels this cough is definitely different. He has been treated with multiple rounds of antibiotics and prednisone without a significant change. He has had a recent CT chest that did not show pulmonary emboli, but he did have minimal patchy densities that are of unknown significance. They could be inflammatory in nature, or possibly related to fluid. The patient denies any exposures to suggest hypersensitivity pneumonitis, and we will check a sedimentation rate today to see if this may represent BOOP. I suspect his CT findings are not related to his current symptoms. My other concern is that he may have had progressive airflow obstruction, and will need to be on more aggressive treatment. I will also schedule for full pulmonary function studies to evaluate.

## 2014-09-07 NOTE — Progress Notes (Signed)
   Subjective:    Patient ID: Jeffrey Cobb, male    DOB: Jan 08, 1941, 74 y.o.   MRN: [id number]  HPI The patient comes in today for an acute sick visit. He has known mild COPD and chronic upper airway cough secondary to the irritable larynx syndrome. Starting in October of last year he began to have increasing cough that he felt was different than his usual. He produced small quantities of purulent mucus at times, and has been treated with multiple rounds of antibiotics and prednisone. He is also noticed progressive dyspnea on exertion that is significantly impacting his quality of life at this point. He denies any chest discomfort or increased wheezing. He still has a cough at this point that produces scant mucus, but he is unclear whether this is coming from his throat and upper chest or in his lower chest. He has had a CT angiogram that ruled out pulmonary emboli, did show a minimal patchy densities that were not overly impressive and a few areas of the lung of unknown significance. She denies any unusual exposures, or hobbies that may lead to an inhalational injury. He does not feel that his sinuses aren't issue currently.   Review of Systems  Constitutional: Negative for fever and unexpected weight change.  HENT: Positive for congestion and postnasal drip. Negative for dental problem, ear pain, nosebleeds, rhinorrhea, sinus pressure, sneezing, sore throat and trouble swallowing.   Eyes: Negative for redness and itching.  Respiratory: Positive for cough, chest tightness, shortness of breath and wheezing.   Cardiovascular: Negative for palpitations and leg swelling.  Gastrointestinal: Negative for nausea and vomiting.  Genitourinary: Negative for dysuria.  Musculoskeletal: Negative for joint swelling.  Skin: Negative for rash.  Neurological: Negative for headaches.  Hematological: Does not bruise/bleed easily.  Psychiatric/Behavioral: Negative for dysphoric mood. The patient is not  nervous/anxious.        Objective:   Physical Exam Well-developed male in no acute distress Nose without purulence or discharge noted Neck without lymphadenopathy or thyromegaly Chest totally clear to auscultation, no wheezing Cardiac exam with regular rate and rhythm Lower extremities without edema, no cyanosis Alert and oriented, moves all 4 extremities.       Assessment & Plan:

## 2014-09-08 ENCOUNTER — Ambulatory Visit (HOSPITAL_COMMUNITY)
Admission: RE | Admit: 2014-09-08 | Discharge: 2014-09-08 | Disposition: A | Discharge status: Home / Self Care | Payer: Medicare Other | Source: Ambulatory Visit | Attending: Pulmonary Disease | Admitting: Pulmonary Disease

## 2014-09-08 DIAGNOSIS — R05 Cough: Secondary | ICD-10-CM | POA: Diagnosis not present

## 2014-09-08 DIAGNOSIS — R0609 Other forms of dyspnea: Secondary | ICD-10-CM | POA: Diagnosis present

## 2014-09-08 DIAGNOSIS — Z87891 Personal history of nicotine dependence: Secondary | ICD-10-CM | POA: Insufficient documentation

## 2014-09-08 LAB — PULMONARY FUNCTION TEST
DL/VA % pred: 59 %
DL/VA: 2.6 ml/min/mmHg/L
DLCO COR % PRED: 49 %
DLCO UNC % PRED: 49 %
DLCO cor: 14.05 ml/min/mmHg
DLCO unc: 14.05 ml/min/mmHg
FEF 25-75 PRE: 1.75 L/s
FEF 25-75 Post: 1.53 L/sec
FEF2575-%CHANGE-POST: -12 %
FEF2575-%Pred-Post: 76 %
FEF2575-%Pred-Pre: 87 %
FEV1-%Change-Post: -1 %
FEV1-%Pred-Post: 112 %
FEV1-%Pred-Pre: 114 %
FEV1-POST: 3.08 L
FEV1-Pre: 3.13 L
FEV1FVC-%CHANGE-POST: 3 %
FEV1FVC-%Pred-Pre: 95 %
FEV6-%Change-Post: -2 %
FEV6-%Pred-Post: 115 %
FEV6-%Pred-Pre: 118 %
FEV6-POST: 4.08 L
FEV6-Pre: 4.18 L
FEV6FVC-%Change-Post: 2 %
FEV6FVC-%PRED-POST: 101 %
FEV6FVC-%PRED-PRE: 99 %
FVC-%Change-Post: -4 %
FVC-%Pred-Post: 113 %
FVC-%Pred-Pre: 119 %
FVC-Post: 4.31 L
FVC-Pre: 4.5 L
PRE FEV6/FVC RATIO: 93 %
Post FEV1/FVC ratio: 72 %
Post FEV6/FVC ratio: 95 %
Pre FEV1/FVC ratio: 69 %
RV % pred: 71 %
RV: 1.69 L
TLC % PRED: 94 %
TLC: 6.07 L

## 2014-09-08 MED ORDER — ALBUTEROL SULFATE (2.5 MG/3ML) 0.083% IN NEBU
2.5000 mg | INHALATION_SOLUTION | Freq: Once | RESPIRATORY_TRACT | Status: AC
  Administered 2014-09-08: 2.5 mg via RESPIRATORY_TRACT

## 2014-09-12 ENCOUNTER — Ambulatory Visit (HOSPITAL_COMMUNITY): Payer: Medicare Other | Attending: Internal Medicine | Admitting: Cardiology

## 2014-09-12 DIAGNOSIS — R0609 Other forms of dyspnea: Secondary | ICD-10-CM | POA: Insufficient documentation

## 2014-09-12 NOTE — Progress Notes (Signed)
Echo performed. 

## 2014-09-13 ENCOUNTER — Telehealth: Payer: Self-pay | Admitting: Pulmonary Disease

## 2014-09-13 ENCOUNTER — Encounter: Payer: Self-pay | Admitting: Pulmonary Disease

## 2014-09-13 NOTE — Telephone Encounter (Signed)
Need to see in office to review.  See other note under pfts.

## 2014-09-13 NOTE — Telephone Encounter (Signed)
PFT done 09/08/14. Please advise KC thanks

## 2014-09-14 NOTE — Telephone Encounter (Signed)
Called pt and appt scheduled for friday

## 2014-09-14 NOTE — Telephone Encounter (Signed)
Looks as though Mindy has spoken with patient this morning and has scheduled OV with KC to review test results in person. Nothing more needed at this time.

## 2014-09-16 ENCOUNTER — Ambulatory Visit: Payer: Medicare Other | Admitting: Pulmonary Disease

## 2014-09-20 ENCOUNTER — Other Ambulatory Visit: Payer: Self-pay | Admitting: Pulmonary Disease

## 2014-09-20 ENCOUNTER — Encounter: Payer: Self-pay | Admitting: Pulmonary Disease

## 2014-09-20 ENCOUNTER — Ambulatory Visit (INDEPENDENT_AMBULATORY_CARE_PROVIDER_SITE_OTHER): Payer: Medicare Other | Admitting: Pulmonary Disease

## 2014-09-20 VITALS — BP 118/74 | HR 73 | Temp 97.8°F | Ht 67.0 in | Wt 206.4 lb

## 2014-09-20 DIAGNOSIS — R05 Cough: Secondary | ICD-10-CM

## 2014-09-20 DIAGNOSIS — R0609 Other forms of dyspnea: Secondary | ICD-10-CM

## 2014-09-20 DIAGNOSIS — R053 Chronic cough: Secondary | ICD-10-CM

## 2014-09-20 DIAGNOSIS — J438 Other emphysema: Secondary | ICD-10-CM

## 2014-09-20 NOTE — Progress Notes (Signed)
   Subjective:    Patient ID: Jeffrey Cobb, male    DOB: 1940-09-08, 74 y.o.   MRN: [id number]  HPI The patient comes in today for follow-up of his recent PFTs. He has a history of a chronic cough secondary to the irritable larynx syndrome, and also emphysema with minimal airflow obstruction. He has done very well over the years, but recently had worsening cough that was different from his usual, and also progressive dyspnea. He did not respond to multiple rounds of antibiotics and prednisone, and he did have a CT angio that did not show a pulmonary embolus, but there were minimal patchy ground glass that are not overly impressive. His sedimentation rate was normal, and his recent PFTs are actually the same to improved from 2009. His diffusion capacity was worse, which is not unexpected given his emphysema. The patient comes in today where he states that his symptoms have greatly improved since our last visit. His cough has almost totally resolved, and his exertional tolerance is nearing his usual baseline. He is unsure what has made a difference.   Review of Systems  Constitutional: Negative for fever and unexpected weight change.  HENT: Positive for congestion and postnasal drip. Negative for dental problem, ear pain, nosebleeds, rhinorrhea, sinus pressure, sneezing, sore throat and trouble swallowing.   Eyes: Negative for redness and itching.  Respiratory: Positive for cough, chest tightness, shortness of breath and wheezing.   Cardiovascular: Negative for palpitations and leg swelling.  Gastrointestinal: Negative for nausea and vomiting.  Genitourinary: Negative for dysuria.  Musculoskeletal: Negative for joint swelling.  Skin: Negative for rash.  Neurological: Negative for headaches.  Hematological: Does not bruise/bleed easily.  Psychiatric/Behavioral: Negative for dysphoric mood. The patient is not nervous/anxious.        Objective:   Physical Exam Overweight male in no acute  distress Nose without purulence or discharge noted Neck without lymphadenopathy or thyromegaly LE with no significant edema, no cyanosis Alert and oriented, moves all 4.        Assessment & Plan:

## 2014-09-20 NOTE — Patient Instructions (Signed)
Will do followup scan of your chest in July.  Let us know if you do not hear from us. Work on weight loss and exercise program No change in meds for now Please call if you have a return of your prior symptoms, and we can discuss further options.

## 2014-09-20 NOTE — Assessment & Plan Note (Signed)
The patient feels that his prior worsening symptoms have now significantly improved, and he is not far from his usual baseline. I suspect that his findings on his CT chest have nothing to do with his recent acute decompensation, and it is unclear what caused his recent improvement and near returned to baseline. For now, I do not want to change any of his pulmonary treatment, and have encouraged him to work on weight loss and some type of conditioning program. He will need to have a follow-up CT chest in 6 months in light of his recent findings.

## 2014-09-20 NOTE — Assessment & Plan Note (Signed)
The patient's follow-up PFTs show actual improvement compared to 2009. The only parameter that was worse was his diffusion capacity. The patient has never seen any benefit with maintenance medications, but if he has a return of his prior symptoms from a few weeks ago, I would consider putting him on a long-acting bronchodilator regimen.

## 2014-10-03 ENCOUNTER — Encounter: Payer: Self-pay | Admitting: Cardiology

## 2014-10-03 ENCOUNTER — Ambulatory Visit (INDEPENDENT_AMBULATORY_CARE_PROVIDER_SITE_OTHER): Payer: Medicare Other | Admitting: Cardiology

## 2014-10-03 VITALS — BP 124/80 | HR 83 | Ht 67.0 in | Wt 202.0 lb

## 2014-10-03 DIAGNOSIS — R0609 Other forms of dyspnea: Secondary | ICD-10-CM

## 2014-10-03 DIAGNOSIS — R0600 Dyspnea, unspecified: Secondary | ICD-10-CM

## 2014-10-03 DIAGNOSIS — I5189 Other ill-defined heart diseases: Secondary | ICD-10-CM | POA: Insufficient documentation

## 2014-10-03 DIAGNOSIS — I739 Peripheral vascular disease, unspecified: Principal | ICD-10-CM

## 2014-10-03 DIAGNOSIS — I519 Heart disease, unspecified: Secondary | ICD-10-CM

## 2014-10-03 DIAGNOSIS — I779 Disorder of arteries and arterioles, unspecified: Secondary | ICD-10-CM

## 2014-10-03 NOTE — Progress Notes (Signed)
Patient ID: Jeffrey PewRufus B Cobb, male   DOB: Oct 30, 1940, 74 y.o.   MRN: [id number]       Patient Name: Jeffrey Cobb Date of Encounter: 10/03/2014  Primary Care Provider:   Duane Lopeoss, Alan, MD Primary Cardiologist:  Eloy EndK. Lillian Ballester, MD   Patient Profile  74 y/o male with a h/o COPD, HL, and recent persistent cough and DOE who presents for eval.  Problem List   Past Medical History  Diagnosis Date  . Occlusion and stenosis of carotid artery without mention of cerebral infarction     a. 05/2013 Carotid U/S: 1-39% bilat stenosis.  . Obstructive chronic bronchitis with exacerbation   . Other emphysema   . Mixed hyperlipidemia   . Persistent disorder of initiating or maintaining sleep   . Acute sinusitis, unspecified   . GERD (gastroesophageal reflux disease)   . Arthritis   . Sleep apnea     a. on CPAP.   Past Surgical History  Procedure Laterality Date  . Spine surgery  2000/2011  . Appendectomy  1956  . Right ankle reconstruction  1986  . Ruptured disk  2000  . Left knee replacement  2001  . Right knee replacement  2005  . Joint replacement Right     shoulder replacement  . Eye surgery Bilateral     cataract removal  . Torn retina    . Total shoulder arthroplasty Left 06/30/2013    Procedure: TOTAL SHOULDER ARTHROPLASTY;  Surgeon: Loreta Aveaniel F Murphy, MD;  [facility];  Service: Orthopedics;  Laterality: Left;    Allergies  Allergies  Allergen Reactions  . Sulfonamide Derivatives Rash  . Trazodone And Nefazodone Rash and Other (See Comments)    Hallucinations     HPI  74 y/o male with the above problem list.  He is followed here 2/2 non-obstructive bilat carotid dzs and is due for f/u U/S later this year.  He also has a h/o COPD for which he is on prn albuterol only.  He was dx with severe OSA last summer and was placed on CPAP using a nasal pillow (couldn't tolerate mask 2/2 claustrophobia).  Unfortunately, he feels that the nasal pillow causes him nasal pressure and  stuffiness, and thus he hasn't been using that either.  He says that beginning in ~ late October (though looking back further, notes from pulmonology show that he had similar Ss beginning in August), he noted increased dry, hacking cough without fever or chills.  He also noted mild DOE. He took some amoxicillin left over from prior dental visit and says that he took 1 tab bid for 10 days w/o much impact on either the cough or dyspnea.  He saw his PCP in November and was placed on doxycycline and also given a depmedrol inj.  Despite these interventions, his cough has continued and his dyspnea has progressed.  Now, he can only walk about 50 yds or so prior to having to stop and rest.  He has not had any chest pain and denies any h/o pnd, orthopnea, n, v, dizziness, syncope, or early satiety.  At one point in Nov or December, his wife noted mild lower ext swelling but this has since resolved without intervention.  He saw his PCP on 12/31 and due to ongoing Ss, he was set up to see me today.  10/03/2014 - the patient is coming for follow-up with his results. He was seen just one month's ago by Ward Givenshris Berge for worsening dyspnea on exertion. Patient underwent lab  work including d dimer returned elevated @ 1.03.  CBC/bmet wnl. Therefore CT chest was ordered that showed no pulmonary embolism but underlying lung emphysema and interstitial lung disease. Patient also had an echocardiogram done that showed normal systolic function grade 1 diastolic dysfunction with normal filling pressures and normal wall motion abnormalities. The patient also underwent Lexis can nuclear stress test with low level of exercise that showed no ischemia and no prior scar.   Home Medications  Prior to Admission medications   Medication Sig Start Date End Date Taking? Authorizing Provider  diclofenac sodium (VOLTAREN) 1 % GEL Apply topically as needed.   Yes Historical Provider, MD  doxazosin (CARDURA) 2 MG tablet Take 2 mg by mouth  daily.  08/22/11  Yes Historical Provider, MD  fluticasone (FLONASE) 50 MCG/ACT nasal spray Place 50 sprays into both nostrils as needed. 05/31/14  Yes Historical Provider, MD  LEVITRA 20 MG tablet Take 20 mg by mouth daily as needed for erectile dysfunction.  06/11/11  Yes Historical Provider, MD  pantoprazole (PROTONIX) 40 MG tablet Take 1 tablet (40 mg total) by mouth 2 (two) times daily. 05/23/14  Yes Barbaraann Share, MD  pramipexole (MIRAPEX) 0.5 MG tablet Use a half tab in AM and 1.5 tabs in PM po. 04/15/14  Yes Melvyn Novas, MD  PROAIR HFA 108 (90 BASE) MCG/ACT inhaler Inhale 2 puffs into the lungs every 6 (six) hours as needed. 01/28/14  Yes Historical Provider, MD  zolpidem (AMBIEN) 10 MG tablet Take 10 mg by mouth at bedtime. 08/26/14  Yes Historical Provider, MD  Ascorbic Acid (VITAMIN C PO) Take by mouth.    Historical Provider, MD  aspirin EC 81 MG tablet Take 1 tablet (81 mg total) by mouth daily. Patient not taking: Reported on 09/02/2014 04/14/13   Gaylord Shih, MD   Review of Systems  DOE and dry cough as above.  He denies chest pain, palpitations, pnd, orthopnea, n, v, dizziness, syncope, edema, weight gain, or early satiety.  All other systems reviewed and are otherwise negative except as noted above.  Physical Exam  Blood pressure 124/80, pulse 83, height  (1.702 m), weight 202 lb (91.627 kg), SpO2 96 %.  General: Pleasant, NAD Psych: Normal affect. Neuro: Alert and oriented X 3. Moves all extremities spontaneously. HEENT: Normal  Neck: Supple without bruits or JVD. Lungs:  Resp regular and unlabored, CTA. Heart: RRR no s3, s4, or murmurs. Abdomen: Soft, non-tender, non-distended, BS + x 4.  Extremities: No clubbing, cyanosis or edema. DP/PT/Radials 2+ and equal bilaterally.  Accessory Clinical Findings  ECG - RSR, 72, LAD, LAFB - no acute st/t changes.  Echocardiogram: 09/12/2014 Left ventricle: The cavity size was normal. Wall thickness was increased in a  pattern of mild LVH. Systolic function was normal. The estimated ejection fraction was in the range of 55% to 60%. Doppler parameters are consistent with abnormal left ventricular relaxation (grade 1 diastolic dysfunction). - Aortic valve: There was trivial regurgitation. - Mitral valve: There was mild regurgitation.  CT chest performed on 09/02/2014 IMPRESSION: 1. No evidence of pulmonary embolism. 2. The appearance of the lungs could suggest interstitial lung disease. Specifically, the appearance is suspicious for potential nonspecific interstitial pneumonia (NSIP). Repeat evaluation with high-resolution chest CT in 6 months is suggested to assess for temporal changes in the appearance of the lung parenchyma. 3. Atherosclerosis, including left main and left anterior descending coronary artery disease. Assessment for potential risk factor modification, dietary therapy or pharmacologic therapy may  be warranted, if clinically indicated.  Results discussed with patient.  Rec pulm f/u re: CT findings and f/u CT in 6 months @ pulmonary discretion.  Lexi scan nuclear stress test performed on 09/07/2014 Quantitative Gated Spect Images QGS EDV: 97 ml QGS ESV: 42 ml  Impression Exercise Capacity: Lexiscan with low level exercise. BP Response: Normal blood pressure response. Clinical Symptoms: Shortness of breath ECG Impression: No significant ST segment change suggestive of ischemia. Comparison with Prior Nuclear Study: The study is compared to the report of the study from 2009  Overall Impression: Normal stress nuclear study. This is a low risk scan. There is no scar or ischemia.  LV Ejection Fraction: 57%. LV Wall Motion: Normal Wall Motion   Willa Rough, MD   Assessment & Plan  1.  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  His dyspnea has persisted despite two rounds of abx and a steroid inj in December.  He now experienced DOE after walking only about 50 yds, his cardiac workup is completely negative as stated about his d-dimer was mildly elevated and he underwent CT chest that only showed emphysema and interstitial lung disease, he is already following with pulmonary doctor that currently doesn't have any suggestion in history peak right now. His echocardiogram only showed diastolic dysfunction but normal elevated filling pressures. His stress test was negative for prior scar or ischemia.  2. Chronic diastolic CHF - grade 1 diastolic dysfunction with normal filling pressure, patient is euvolemic, although compensated and doesn't need any changes to his therapy at this point.  3.  Non-obstructive bilateral carotid arterial disease:  1-39% bilat stenosis on last U/S in 05/2013 with plan for f/u U/S this year.  4.  Dispo:  Follow up in one year unless changes in clinical status.   Lars Masson, NP 10/03/2014, 9:49 AM

## 2014-10-03 NOTE — Addendum Note (Signed)
Addended by: Loa SocksMARTIN, Sascha Baugher M on: 10/03/2014 02:29 PM   Modules accepted: Orders

## 2014-10-03 NOTE — Patient Instructions (Addendum)
Your physician recommends that you continue on your current medications as directed. Please refer to the Current Medication list given to you today.   Your physician has requested that you have a carotid duplex. This test is an ultrasound of the carotid arteries in your neck. It looks at blood flow through these arteries that supply the brain with blood. Allow one hour for this exam. There are no restrictions or special instructions.     Your physician wants you to follow-up in: ONE YEAR WITH DR Johnell ComingsNELSON You will receive a reminder letter in the mail two months in advance. If you don't receive a letter, please call our office to schedule the follow-up appointment.

## 2014-10-07 ENCOUNTER — Ambulatory Visit (HOSPITAL_COMMUNITY): Payer: Medicare Other | Attending: Cardiovascular Disease | Admitting: Cardiology

## 2014-10-07 DIAGNOSIS — I6523 Occlusion and stenosis of bilateral carotid arteries: Secondary | ICD-10-CM | POA: Diagnosis present

## 2014-10-07 NOTE — Progress Notes (Signed)
Carotid duplex performed 

## 2014-10-11 ENCOUNTER — Telehealth: Payer: Self-pay | Admitting: *Deleted

## 2014-10-11 DIAGNOSIS — I739 Peripheral vascular disease, unspecified: Principal | ICD-10-CM

## 2014-10-11 DIAGNOSIS — I779 Disorder of arteries and arterioles, unspecified: Secondary | ICD-10-CM

## 2014-10-11 NOTE — Telephone Encounter (Signed)
-----   Message from Lars MassonKatarina H Nelson, MD sent at 10/07/2014 11:24 AM EST ----- Stable mild carotid disease. Follow up in 2 years

## 2014-10-11 NOTE — Telephone Encounter (Signed)
Notified the pt that per Dr Delton SeeNelson his carotid duplex showed stable and mild carotid disease, and she recommends that we follow-up with a repeat study in 2 years.  Informed the pt that [facility] him to have the carotid duplex scheduled for 2 years out.  Pt verbalized understanding and agrees with this plan.

## 2014-10-18 ENCOUNTER — Encounter: Payer: Self-pay | Admitting: Nurse Practitioner

## 2014-10-18 ENCOUNTER — Ambulatory Visit (INDEPENDENT_AMBULATORY_CARE_PROVIDER_SITE_OTHER): Payer: 59 | Admitting: Nurse Practitioner

## 2014-10-18 ENCOUNTER — Ambulatory Visit: Payer: Medicare Other | Admitting: Nurse Practitioner

## 2014-10-18 VITALS — BP 126/85 | HR 77 | Ht 67.0 in | Wt 202.8 lb

## 2014-10-18 DIAGNOSIS — G2581 Restless legs syndrome: Secondary | ICD-10-CM

## 2014-10-18 DIAGNOSIS — R05 Cough: Secondary | ICD-10-CM

## 2014-10-18 DIAGNOSIS — R053 Chronic cough: Secondary | ICD-10-CM

## 2014-10-18 DIAGNOSIS — G4710 Hypersomnia, unspecified: Secondary | ICD-10-CM

## 2014-10-18 DIAGNOSIS — G4730 Sleep apnea, unspecified: Secondary | ICD-10-CM

## 2014-10-18 DIAGNOSIS — R209 Unspecified disturbances of skin sensation: Secondary | ICD-10-CM

## 2014-10-18 MED ORDER — PRAMIPEXOLE DIHYDROCHLORIDE 0.5 MG PO TABS: ORAL_TABLET | ORAL | Status: DC

## 2014-10-18 NOTE — Progress Notes (Signed)
[facility]  PATIENT: Jeffrey Cobb DOB: [birth year]   REASON FOR VISIT: Restless leg syndrome, increased numbness in the feet, obstructive sleep apnea  HISTORY FROM: Patient    HISTORY OF PRESENT ILLNESS:[name], 74 year old male returns for followup. He has a history of restless leg syndrome and sensory dysesthesias. He was last seen in this office for sleep follow-up by Dr. Vickey Hugerohmeier 04/15/2014. At that time he was using his CPAP at 10 cm with good compliance. He also has chronic insomnia. Since that time however in October he began to have a chronic cough that did not respond to antibiotics or prednisone. He has been unable to use his CPAP which is evident on his compliance report.he has seen (primary care and Dr. Shelle Ironlance pulmonologist for his chronic cough. He tells me today that it is some better. He has been referred to ENT. On today's visit he also reports increased numbness, tingling/burning and discomfort in his feet, this is been an ongoing problem but it has worsened in the last 6 months. He has a history of spinal stenosis and lumbar spine decompression 2011 by Dr. Gerlene FeeKritzer. He denies any falls however he does feel off balance at times. He has intermittent back pain since his surgery. His restless legs are under good control with his Mirapex. He returns for reevaluation   HISTORY: of sensory dysesthesias, restless legs and insomnia.  MRI of the spine (lumbar) that he had done in April of 2009 showed moderate to severe spinal stenosis. He had lumbar spine decompression and fusion by Dr. Gerlene FeeKritzer in 2011, did very well. He also has a history of right and left knee replacements and a shoulder replacement. He is currently taking Mirapex for his restless legs tolerating the medication without drowsiness or any type of compulsive behaviors. iron profile was normal.      REVIEW OF SYSTEMS: Full 14 system review of systems performed and notable only for those  listed, all others are neg:  Constitutional: neg  Cardiovascular: neg Ear/Nose/Throat: neg  Skin: neg Eyes: neg Respiratory Cough, shortness of breath Gastrointestinal  neg  Hematology/Lymphatic: neg  Endocrine: neg Musculoskeletal Intermittent back pain Allergy Immunology: neg Neurological: neg Psychiatric: neg Sleep : Restless legs, insomnia, obstructive sleep apnea with CPAP ALLERGIES: Allergies  Allergen Reactions  . Sulfonamide Derivatives Rash  . Trazodone And Nefazodone Rash and Other (See Comments)    Hallucinations     HOME MEDICATIONS: Outpatient Prescriptions Prior to Visit  Medication Sig Dispense Refill  . doxazosin (CARDURA) 2 MG tablet Take 2 mg by mouth daily.     . fluticasone (FLONASE) 50 MCG/ACT nasal spray Place 50 sprays into both nostrils as needed.  3  . LEVITRA 20 MG tablet Take 20 mg by mouth daily as needed for erectile dysfunction.     . pantoprazole (PROTONIX) 40 MG tablet Take 1 tablet (40 mg total) by mouth 2 (two) times daily. 180 tablet 1  . pramipexole (MIRAPEX) 0.5 MG tablet Use a half tab in AM and 1.5 tabs in PM po. (Patient taking differently: 1 tab in the a.m and 1 tab in the p.m) 180 tablet 3  . PROAIR HFA 108 (90 BASE) MCG/ACT inhaler Inhale 2 puffs into the lungs every 6 (six) hours as needed.    . Ascorbic Acid (VITAMIN C PO) Take by mouth.    Marland Kitchen. aspirin EC 81 MG tablet Take 1 tablet (81 mg total) by mouth daily. (Patient not taking: Reported on 10/18/2014) 90 tablet 3  .  diclofenac sodium (VOLTAREN) 1 % GEL Apply topically as needed.    . ramelteon (ROZEREM) 8 MG tablet Take 8 mg by mouth at bedtime.     No facility-administered medications prior to visit.    PAST MEDICAL HISTORY: Past Medical History  Diagnosis Date  . Occlusion and stenosis of carotid artery without mention of cerebral infarction     a. 05/2013 Carotid U/S: 1-39% bilat stenosis.  . Obstructive chronic bronchitis with exacerbation   . Other emphysema   . Mixed  hyperlipidemia   . Persistent disorder of initiating or maintaining sleep   . Acute sinusitis, unspecified   . GERD (gastroesophageal reflux disease)   . Arthritis   . Sleep apnea     a. on CPAP.    PAST SURGICAL HISTORY: Past Surgical History  Procedure Laterality Date  . Spine surgery  2000/2011  . Appendectomy  1956  . Right ankle reconstruction  1986  . Ruptured disk  2000  . Left knee replacement  2001  . Right knee replacement  2005  . Joint replacement Right     shoulder replacement  . Eye surgery Bilateral     cataract removal  . Torn retina    . Total shoulder arthroplasty Left 06/30/2013    Procedure: TOTAL SHOULDER ARTHROPLASTY;  Surgeon: Loreta Ave, MD;  Location: [facility];  Service: Orthopedics;  Laterality: Left;    FAMILY HISTORY: Family History  Problem Relation Age of Onset  . Emphysema Mother   . Heart disease Mother   . Heart disease Father   . Allergies      FH: FATHER,2 SISTER,1 BROTHER, SON  DAUGHTER  . Cancer Sister   . Alzheimer's disease Mother     SOCIAL HISTORY: History   Social History  . Marital Status: Married    Spouse Name: Jeffrey Cobb   . Number of Children: 2  . Years of Education: Assoc    Occupational History  . Retired    Social History Main Topics  . Smoking status: Former Smoker -- 3.00 packs/day for 17 years    Types: Cigarettes    Quit date: 08/26/1977  . Smokeless tobacco: Never Used  . Alcohol Use: Yes     Comment: rarely  . Drug Use: No  . Sexual Activity: Not on file   Other Topics Concern  . Not on file   Social History Narrative   Patient is married Jeffrey Cobb) and lives at home with his wife.   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    Patient is left handed   Patient drinks 1-2 cups of coffee in the morning and 3-4 cups of tea daily.                 PHYSICAL EXAM  Filed Vitals:   10/18/14 1015  BP: 126/85  Pulse: 77  Height:  (1.702 m)  Weight: 202 lb  12.8 oz (91.989 kg)   Body mass index is 31.76 kg/(m^2). Generalized: Well developed, Mildly obese male in no acute distress  Head: normocephalic and atraumatic,. Oropharynx benign  Neck: Supple, no carotid bruits  Cardiac: Regular rate rhythm, no murmur  Musculoskeletal: Arthritic changes in hands  Neurological examination   Mentation: Alert oriented to time, place, history taking. Follows all commands speech and language fluent.  Cranial nerve II-XII: Pupils were equal round reactive to light extraocular movements were full, visual field were full on confrontational test. Facial sensation and  strength were normal. hearing was intact to finger rubbing bilaterally. Uvula tongue midline. head turning and shoulder shrug were normal and symmetric.Tongue protrusion into cheek strength was normal. Motor: normal bulk and tone, full strength in the BUE, BLE, fine finger movements normal, no pronator drift. No focal weakness Sensory: Mildly decreased light touch and pinprick to mid foot bilaterally, vibration normal. Proprioception normal  Coordination: finger-nose-finger, heel-to-shin bilaterally, no dysmetria Reflexes: Brachioradialis 2/2, biceps 2/2, triceps 2/2, patellar 0/0, Achilles 1/1, plantar responses were flexor bilaterally. Gait and Station: Rising up from seated position without assistance, normal stance, moderate stride, good arm swing, smooth turning, able to perform tiptoe, and heel walking without difficulty. Tandem gait is unsteady. No assistive device   DIAGNOSTIC DATA (LABS, IMAGING, TESTING)  - I reviewed patient records, labs, notes, testing and imaging myself where available.  Lab Results  Component Value Date   WBC 8.2 09/02/2014   HGB 14.6 09/02/2014   HCT 44.6 09/02/2014   MCV 92.3 09/02/2014   PLT 139.0* 09/02/2014      Component Value Date/Time   NA 136 09/02/2014 1204   K 3.8 09/02/2014 1204   CL 103 09/02/2014 1204   CO2 26 09/02/2014 1204   GLUCOSE  86 09/02/2014 1204   BUN 10 09/02/2014 1204   CREATININE 1.1 09/02/2014 1204   CALCIUM 8.9 09/02/2014 1204   PROT 7.6 06/22/2013 1053   ALBUMIN 3.7 06/22/2013 1053   AST 21 06/22/2013 1053   ALT 14 06/22/2013 1053   ALKPHOS 82 06/22/2013 1053   BILITOT 0.3 06/22/2013 1053   GFRNONAA 69* 07/01/2013 0447   GFRAA 80* 07/01/2013 0447    ASSESSMENT AND PLAN  74 y.o. year old male  has a past medical history of restless leg syndrome Occlusion and stenosis of carotid artery without mention of cerebral infarction; Obstructive chronic bronchitis with exacerbation; Other emphysema; Mixed hyperlipidemia; Persistent disorder of initiating or maintaining sleep;  Sleep apnea. and increased paresthesias in both feet bilaterally.   Continue Mirapex at current dose will refill Set up for EMG,  nerve conduction due to discomfort that has worsened in his feet Important to get back on CPAP after chronic cough has resolved completely/ENT evaluation I explained in particular the risks and ramifications of untreated moderate to severe OSA, especially with respect to cardiovascular disease  including congestive heart failure, difficult to treat hypertension, cardiac arrhythmias, or stroke. Even type 2 diabetes has, in part, been linked to untreated OSA. Symptoms of untreated OSA include daytime sleepiness, memory problems, mood irritability and mood disorder such as depression and anxiety, lack of energy, as well as recurrent headaches, especially morning headaches.  I encouraged the patient to eat healthy, exercise daily and keep well hydrated, to keep a scheduled bedtime and wake time routine, to not skip any meals and eat healthy snacks in between meals F/U 6 months Nilda Riggs, [facility], [facility], APRN  [facility] [address], [address] [place], [place] [address] [phone]

## 2014-10-18 NOTE — Patient Instructions (Addendum)
Continue Mirapex at current dose will refill Set up for EMG nerve conduction due to discomfort in feet F/U 6 months

## 2014-10-18 NOTE — Progress Notes (Signed)
I agree with the assessment and plan as directed by NP .The patient is known to me .   Evren Shankland, MD  

## 2014-10-24 ENCOUNTER — Ambulatory Visit (INDEPENDENT_AMBULATORY_CARE_PROVIDER_SITE_OTHER): Payer: 59 | Admitting: Neurology

## 2014-10-24 ENCOUNTER — Encounter: Payer: Self-pay | Admitting: Neurology

## 2014-10-24 ENCOUNTER — Ambulatory Visit (INDEPENDENT_AMBULATORY_CARE_PROVIDER_SITE_OTHER): Payer: Self-pay | Admitting: Neurology

## 2014-10-24 DIAGNOSIS — R209 Unspecified disturbances of skin sensation: Secondary | ICD-10-CM

## 2014-10-24 DIAGNOSIS — G2581 Restless legs syndrome: Secondary | ICD-10-CM

## 2014-10-24 DIAGNOSIS — R208 Other disturbances of skin sensation: Secondary | ICD-10-CM | POA: Diagnosis not present

## 2014-10-24 NOTE — Procedures (Signed)
     HISTORY:  Jeffrey MallickRufus Cobb is a 74 year old patient with a history of prior lumbosacral spine surgery done in 2007. The patient has had worsening symptoms of discomfort in both feet that are present with weightbearing and with rest. He denies pain radiating from the back down the legs, but he does continue to have chronic low back pain. He is being evaluated for possible neuropathy or a lumbosacral radiculopathy.   NERVE CONDUCTION STUDIES:  Nerve conduction studies were performed on both lower extremities. The distal motor latencies and motor amplitudes for the peroneal and posterior tibial nerves were within normal limits. The nerve conduction velocities for these nerves were also normal. The H reflex latencies were normal. The sensory latencies for the peroneal nerves were within normal limits.   EMG STUDIES:  EMG study was performed on the right lower extremity:  The tibialis anterior muscle reveals 2 to 4K motor units with full recruitment. No fibrillations or positive waves were seen. The peroneus tertius muscle reveals 2 to 4K motor units with full recruitment. No fibrillations or positive waves were seen. The medial gastrocnemius muscle reveals 1 to 3K motor units with full recruitment. No fibrillations or positive waves were seen. The vastus lateralis muscle reveals 2 to 4K motor units with full recruitment. No fibrillations or positive waves were seen. The iliopsoas muscle reveals 2 to 4K motor units with full recruitment. No fibrillations or positive waves were seen. The biceps femoris muscle (long head) reveals 2 to 4K motor units with full recruitment. No fibrillations or positive waves were seen. The lumbosacral paraspinal muscles were tested at 3 levels, and revealed no abnormalities of insertional activity at all 3 levels tested. There was good relaxation.   IMPRESSION:  Nerve conduction studies done on both lower extremities were unremarkable, without evidence of a  peripheral neuropathy. A small fiber neuropathy may be missed by standard nerve conduction studies, however. Clinical correlation is required. EMG evaluation of the right lower extremity was unremarkable, no evidence of an overlying lumbosacral radiculopathy was seen.   [name]. [name] MD 10/24/2014 10:04 AM  [facility] [address] [place], [place] [id number]  Phone [phone] Fax [phone]

## 2014-10-24 NOTE — Progress Notes (Signed)
Please refer to EMG and nerve conduction study procedure note. 

## 2014-12-26 ENCOUNTER — Other Ambulatory Visit: Payer: Self-pay | Admitting: Neurosurgery

## 2014-12-26 DIAGNOSIS — M4316 Spondylolisthesis, lumbar region: Secondary | ICD-10-CM

## 2015-01-04 ENCOUNTER — Ambulatory Visit: Payer: Medicare Other | Admitting: Nurse Practitioner

## 2015-01-05 ENCOUNTER — Ambulatory Visit: Payer: Medicare Other | Admitting: Nurse Practitioner

## 2015-01-16 ENCOUNTER — Ambulatory Visit
Admission: RE | Admit: 2015-01-16 | Discharge: 2015-01-16 | Disposition: A | Discharge status: Home / Self Care | Payer: Medicare Other | Source: Ambulatory Visit | Attending: Neurosurgery | Admitting: Neurosurgery

## 2015-01-16 DIAGNOSIS — M4316 Spondylolisthesis, lumbar region: Secondary | ICD-10-CM

## 2015-01-16 MED ORDER — GADOBENATE DIMEGLUMINE 529 MG/ML IV SOLN
19.0000 mL | Freq: Once | INTRAVENOUS | Status: AC | PRN
  Administered 2015-01-16: 19 mL via INTRAVENOUS

## 2015-01-17 ENCOUNTER — Other Ambulatory Visit: Payer: Self-pay | Admitting: Neurology

## 2015-02-10 ENCOUNTER — Other Ambulatory Visit: Payer: Self-pay | Admitting: Adult Health

## 2015-02-10 DIAGNOSIS — R0609 Other forms of dyspnea: Principal | ICD-10-CM

## 2015-02-10 NOTE — Progress Notes (Signed)
HRCT ordered 08/2014 at ov w/ [facility] no longer with practice  HRCT order changed to TP for provider and follow up pulmonary doc will be decided after results received

## 2015-02-20 ENCOUNTER — Other Ambulatory Visit: Payer: Self-pay

## 2015-03-09 ENCOUNTER — Ambulatory Visit (INDEPENDENT_AMBULATORY_CARE_PROVIDER_SITE_OTHER)
Admission: RE | Admit: 2015-03-09 | Discharge: 2015-03-09 | Disposition: A | Discharge status: Home / Self Care | Payer: Medicare Other | Source: Ambulatory Visit | Attending: Adult Health | Admitting: Adult Health

## 2015-03-09 ENCOUNTER — Other Ambulatory Visit: Payer: Medicare Other

## 2015-03-09 DIAGNOSIS — R0609 Other forms of dyspnea: Secondary | ICD-10-CM | POA: Diagnosis not present

## 2015-03-13 NOTE — Progress Notes (Signed)
Quick Note:  Called and spoke with pt. Reviewed results and recs. Pt voiced understanding had no further questions. ______

## 2015-04-03 ENCOUNTER — Encounter: Payer: Self-pay | Admitting: Pulmonary Disease

## 2015-04-03 ENCOUNTER — Ambulatory Visit (INDEPENDENT_AMBULATORY_CARE_PROVIDER_SITE_OTHER): Payer: Medicare Other | Admitting: Pulmonary Disease

## 2015-04-03 VITALS — BP 140/82 | HR 92 | Ht 67.0 in | Wt 199.6 lb

## 2015-04-03 DIAGNOSIS — R05 Cough: Secondary | ICD-10-CM

## 2015-04-03 DIAGNOSIS — J84112 Idiopathic pulmonary fibrosis: Secondary | ICD-10-CM | POA: Insufficient documentation

## 2015-04-03 DIAGNOSIS — R911 Solitary pulmonary nodule: Secondary | ICD-10-CM | POA: Diagnosis not present

## 2015-04-03 DIAGNOSIS — J849 Interstitial pulmonary disease, unspecified: Secondary | ICD-10-CM | POA: Diagnosis not present

## 2015-04-03 DIAGNOSIS — R053 Chronic cough: Secondary | ICD-10-CM

## 2015-04-03 DIAGNOSIS — J438 Other emphysema: Secondary | ICD-10-CM

## 2015-04-03 MED ORDER — GABAPENTIN 100 MG PO CAPS: ORAL_CAPSULE | ORAL | Status: DC

## 2015-04-03 NOTE — Assessment & Plan Note (Signed)
He has mild COPD and actually his shortness of breath has been improving recently. Further, his lung function testing from this year showed some mild improvement in air trapping compared to 7 years ago. So in general I do not feel that COPD is a severe problem for him at this time. There is no need to change his medications currently.

## 2015-04-03 NOTE — Progress Notes (Signed)
Subjective:    Patient ID: Jeffrey Cobb, male    DOB: 1941/01/30, 74 y.o.   MRN: [id number]  Synopsis: COPD and chronic cough, ?pulmonary fibrosis PFT"s 2009:  FEV1 3.49 (115%), ratio 65, DLCO 67%. No change with maintenance meds.  PFT's 08/2014:  FEV1 3.13 (114%), ratio 69, decreased airtrapping from 2009, DLCO 49% 2016 CT Chest showed ?early UIP and pulmonary nodules felt to be inflammatory Quit smoking in 1979 after 16 years of smoking 2 ppd  HPI Chief Complaint  Patient presents with  . Follow-up    Patient says he is not feeling well, still has a lot of SOB, chest tightness, DOE, very fatigued; dicuss CT results from 03/09/15   Cough no better, worse at night night, eating no difference.  The cough started in 04/2014, not much better since then. Dyspnea better now compared to a year ago,  Uses albuterol to help with cough> helps.  Recent allergy testing was normal.  Was treated with Virgel Bouquet, Allegra, and a nasal spray which actually made his cough worse. Has a history of bronchodilators make cough worse. He used to treat cyclical cough with voice rest and cough suppression which worked pretty well for him.  Noted that inhalers would make the cough worse.  Some post nasal drip, not much different lately than chronically.  He clears his throat a lot. In July before his CT he went out to [place] to visit.  His cough was better when he was out there. He was able to work without dyspnea.     Past Medical History  Diagnosis Date  . Occlusion and stenosis of carotid artery without mention of cerebral infarction     a. 05/2013 Carotid U/S: 1-39% bilat stenosis.  . Obstructive chronic bronchitis with exacerbation   . Other emphysema   . Mixed hyperlipidemia   . Persistent disorder of initiating or maintaining sleep   . Acute sinusitis, unspecified   . GERD (gastroesophageal reflux disease)   . Arthritis   . Sleep apnea     a. on CPAP.      Review of Systems  Constitutional:  Negative for fever, chills and fatigue.  HENT: Positive for rhinorrhea. Negative for sinus pressure and sneezing.   Respiratory: Positive for cough. Negative for shortness of breath and wheezing.   Cardiovascular: Negative for chest pain, palpitations and leg swelling.       Objective:   Physical Exam Filed Vitals:   04/03/15 1214  BP: 140/82  Pulse: 92  Height:  (1.702 m)  Weight: 199 lb 9.6 oz (90.538 kg)  SpO2: 98%   RA  Gen: well appearing HENT: OP clear, TM's clear, neck supple PULM: Crackles bases, normal percussion CV: RRR, no mgr, trace edema GI: BS+, soft, nontender Derm: no cyanosis or rash Psyche: normal mood and affect   July 2016 CT chest images personally reviewed showing nodules in the upper lobes bilaterally with nonspecific groundglass type changes in the bases of the lungs Prior notes from my partner reviewed where he was treated for COPD and chronic 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     Assessment & Plan:  COPD (chronic obstructive pulmonary disease) He has mild COPD and actually his shortness of breath has been improving recently. Further, his lung function testing from this year showed some mild improvement in air trapping compared to 7 years ago. So in general I do not feel that COPD is a severe problem for him at this time.  There is no need to change his medications currently.  Chronic cough This has been felt to be due to a laryngeal irritation syndrome which I think is reasonable. He continues to have some postnasal drip there interestingly his allergy testing recently was completely normal. He does not feel that he has heartburn symptoms. Considering the duration of symptoms and the failure to respond to COPD treatment it would be reasonable to start gabapentin for irritable larynx syndrome.  Plan: Start gabapentin 100 mg 3 times a day, gradually increase to 300 mg 3 times a day Continue to avoid throat clearing, continue  to try to suppress cough  Interstitial lung disease There was a suggestion of interstitial lung disease on his high-resolution CT chest from July 2016. I have personally reviewed these images and while there is some mild groundglass there is no clear fibrotic change. Because his shortness of breath has been improving and his lung function testing this year showed improvement compared to 7 years ago think the likelihood of a concomitant interstitial lung disease is quite low. However, considering his prior smoking history the CT chest abnormalities should not be ignored.  Plan: Repeat pulmonary function testing in February 2017 Consider repeat high resolution CT chest in 2017, particularly of shortness of breath returns  Solitary pulmonary nodule He had several pulmonary nodule seen on his CT chest which were felt to be related to an inflammatory process. I agree with the radiologist that this is likely inflammatory but considering his prior smoking history these findings cannot be ignored.  Plan: Repeat CT chest in October 2016.     Current outpatient prescriptions:  .  doxazosin (CARDURA) 2 MG tablet, Take 2 mg by mouth daily. , Disp: , Rfl:  .  fluticasone (FLONASE) 50 MCG/ACT nasal spray, Place 50 sprays into both nostrils as needed., Disp: , Rfl: 3 .  LEVITRA 20 MG tablet, Take 20 mg by mouth daily as needed for erectile dysfunction. , Disp: , Rfl:  .  pantoprazole (PROTONIX) 40 MG tablet, Take 1 tablet (40 mg total) by mouth 2 (two) times daily., Disp: 180 tablet, Rfl: 1 .  pramipexole (MIRAPEX) 0.5 MG tablet, 1 tab in the a.m and 1 tab in the p.m, Disp: 180 tablet, Rfl: 3 .  PROAIR HFA 108 (90 BASE) MCG/ACT inhaler, Inhale 2 puffs into the lungs every 6 (six) hours as needed., Disp: , Rfl:  .  zolpidem (AMBIEN) 10 MG tablet, Take 10 mg by mouth at bedtime as needed., Disp: , Rfl: 0 .  gabapentin (NEURONTIN) 100 MG capsule, Start 100mg  po tid for a week, then increase 200mg  tid for a  week, then 300mg  tid, Disp: 270 capsule, Rfl: 2

## 2015-04-03 NOTE — Assessment & Plan Note (Addendum)
This has been felt to be due to a laryngeal irritation syndrome which I think is reasonable. He continues to have some postnasal drip there interestingly his allergy testing recently was completely normal. He does not feel that he has heartburn symptoms. Considering the duration of symptoms and the failure to respond to COPD treatment it would be reasonable to start gabapentin for irritable larynx syndrome.  Plan: Start gabapentin 100 mg 3 times a day, gradually increase to 300 mg 3 times a day Continue to avoid throat clearing, continue to try to suppress cough

## 2015-04-03 NOTE — Assessment & Plan Note (Signed)
He had several pulmonary nodule seen on his CT chest which were felt to be related to an inflammatory process. I agree with the radiologist that this is likely inflammatory but considering his prior smoking history these findings cannot be ignored.  Plan: Repeat CT chest in October 2016.

## 2015-04-03 NOTE — Patient Instructions (Signed)
Start taking Neurontin 100 mg 3 times a day for a week, then increase to 200 mg 3 times a week then increase to 300 mg 3 times a week and maintain at that dose Keep taking her other medications as you're doing We will order a CT scan for October 2016 and see you after that

## 2015-04-03 NOTE — Assessment & Plan Note (Signed)
There was a suggestion of interstitial lung disease on his high-resolution CT chest from July 2016. I have personally reviewed these images and while there is some mild groundglass there is no clear fibrotic change. Because his shortness of breath has been improving and his lung function testing this year showed improvement compared to 7 years ago think the likelihood of a concomitant interstitial lung disease is quite low. However, considering his prior smoking history the CT chest abnormalities should not be ignored.  Plan: Repeat pulmonary function testing in February 2017 Consider repeat high resolution CT chest in 2017, particularly of shortness of breath returns

## 2015-04-10 ENCOUNTER — Ambulatory Visit: Payer: Medicare Other | Admitting: Pulmonary Disease

## 2015-04-19 ENCOUNTER — Ambulatory Visit: Payer: 59 | Admitting: Nurse Practitioner

## 2015-04-24 ENCOUNTER — Encounter: Payer: Self-pay | Admitting: Pulmonary Disease

## 2015-04-24 ENCOUNTER — Ambulatory Visit (INDEPENDENT_AMBULATORY_CARE_PROVIDER_SITE_OTHER): Payer: Medicare Other | Admitting: Pulmonary Disease

## 2015-04-24 VITALS — BP 122/74 | HR 87 | Ht 67.0 in | Wt 199.0 lb

## 2015-04-24 DIAGNOSIS — J387 Other diseases of larynx: Secondary | ICD-10-CM

## 2015-04-24 DIAGNOSIS — R911 Solitary pulmonary nodule: Secondary | ICD-10-CM

## 2015-04-24 DIAGNOSIS — J438 Other emphysema: Secondary | ICD-10-CM | POA: Diagnosis not present

## 2015-04-24 MED ORDER — TRAMADOL HCL 50 MG PO TABS: 50.0000 mg | ORAL_TABLET | Freq: Four times a day (QID) | ORAL | Status: DC | PRN

## 2015-04-24 NOTE — Assessment & Plan Note (Signed)
He had very mild interstitial lung disease changes seen on the most recent CT chest. I do not think that these are significant enough to explain dyspnea or the cough.  Plan: Repeat CT chest in a function test in 2017, will get high-resolution study then, however if he has worsening dyspnea over the next 3 months will get those tests sooner

## 2015-04-24 NOTE — Progress Notes (Signed)
Subjective:    Patient ID: Jeffrey Cobb, male    DOB: 1940-10-08, 74 y.o.   MRN: [id number]  Synopsis: COPD and chronic cough, ?pulmonary fibrosis PFT"s 2009:  FEV1 3.49 (115%), ratio 65, DLCO 67%. No change with maintenance meds.  PFT's 08/2014:  FEV1 3.13 (114%), ratio 69, decreased airtrapping from 2009, DLCO 49% 2016 CT Chest showed ?early UIP and pulmonary nodules felt to be inflammatory Quit smoking in 1979 after 16 years of smoking 2 ppd Failed Gabapentin for cough in 2016 due to dizziness Failed Symbicort and Spiriva in that he felt like they really didn't help symptoms  HPI Chief Complaint  Patient presents with  . Follow-up    pt c/o dizzy spells, cough Xfew days.  pt is concerned this is from his neurontin.  pt increased to 300mg  tid last week just before dizziness started.      Jeffrey Cobb said that when he started taking gabapentin 300mg  po tid last week he started having dizziness which nearly lead to a fall.  Since then he has had some dizzy spells.   He has continued to have his cough and dyspnea.  He has weaned himself off of the neurontin.  He continues to have cough routinely.  He says that next month will be one year since he had the cough. He continuee to have some dyspnea.  He has had to use his albuterol 4-5 times a day recently which is unusual for him.  He does get relief from albuteorl.  No sick contacts.Coughing up pale, yellow-green mucus which isn't really normal for him.    Past Medical History  Diagnosis Date  . Occlusion and stenosis of carotid artery without mention of cerebral infarction     a. 05/2013 Carotid U/S: 1-39% bilat stenosis.  . Obstructive chronic bronchitis with exacerbation   . Other emphysema   . Mixed hyperlipidemia   . Persistent disorder of initiating or maintaining sleep   . Acute sinusitis, unspecified   . GERD (gastroesophageal reflux disease)   . Arthritis   . Sleep apnea     a. on CPAP.      Review of Systems    Constitutional: Negative for fever, chills and fatigue.  HENT: Positive for rhinorrhea. Negative for sinus pressure and sneezing.   Respiratory: Positive for cough. Negative for shortness of breath and wheezing.   Cardiovascular: Negative for chest pain, palpitations and leg swelling.       Objective:   Physical Exam Filed Vitals:   04/24/15 1620  BP: 122/74  Pulse: 87  Height: 5\' 7"  (1.702 m)  Weight: 199 lb (90.266 kg)  SpO2: 96%   RA  Gen: well appearing HENT: OP clear, TM's clear, neck supple PULM: Crackles bases, normal percussion CV: RRR, no mgr, trace edema GI: BS+, soft, nontender Derm: no cyanosis or rash Psyche: normal mood and affect   July 2016 CT chest images personally reviewed showing nodules in the upper lobes bilaterally with nonspecific groundglass type changes in the bases of the lungs Prior notes from my partner reviewed where he was treated for COPD and chronic 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     Assessment & Plan:  Solitary pulmonary nodule Repeat CT chest planned for 05/2015, we will see him after that.  Interstitial lung disease He had very mild interstitial lung disease changes seen on the most recent CT chest. I do not think that these are significant enough to explain dyspnea or the cough.  Plan: Repeat CT chest in a function test in 2017, will get high-resolution study then, however if he has worsening dyspnea over the next 3 months will get those tests sooner  Chronic cough This continues to be a problem and was not effectively treated with the addition of gabapentin. We feel that he has irritable larynx syndrome as there is no appreciable postnasal drip or acid reflux. I do not think that the trace findings of mild interstitial lung disease or his mild COPD are enough to explain his cough.  Plan: ENT evaluation with Dr. Delford Field at [facility] to evaluate for a laryngeal cause of his cough  COPD (chronic  obstructive pulmonary disease) Mild disease. His cough is been refractory to treatment with multiple various COPD medications.  Plan: When necessary albuterol alone     Current outpatient prescriptions:  .  doxazosin (CARDURA) 2 MG tablet, Take 2 mg by mouth daily. , Disp: , Rfl:  .  fluticasone (FLONASE) 50 MCG/ACT nasal spray, Place 50 sprays into both nostrils as needed., Disp: , Rfl: 3 .  LEVITRA 20 MG tablet, Take 20 mg by mouth daily as needed for erectile dysfunction. , Disp: , Rfl:  .  pantoprazole (PROTONIX) 40 MG tablet, Take 1 tablet (40 mg total) by mouth 2 (two) times daily., Disp: 180 tablet, Rfl: 1 .  pramipexole (MIRAPEX) 0.5 MG tablet, 1 tab in the a.m and 1 tab in the p.m, Disp: 180 tablet, Rfl: 3 .  PROAIR HFA 108 (90 BASE) MCG/ACT inhaler, Inhale 2 puffs into the lungs every 6 (six) hours as needed., Disp: , Rfl:  .  zolpidem (AMBIEN) 10 MG tablet, Take 10 mg by mouth at bedtime as needed., Disp: , Rfl: 0 .  traMADol (ULTRAM) 50 MG tablet, Take 1 tablet (50 mg total) by mouth every 6 (six) hours as needed (cough)., Disp: 40 tablet, Rfl: 0

## 2015-04-24 NOTE — Assessment & Plan Note (Signed)
Repeat CT chest planned for 05/2015, we will see him after that.

## 2015-04-24 NOTE — Assessment & Plan Note (Signed)
Mild disease. His cough is been refractory to treatment with multiple various COPD medications.  Plan: When necessary albuterol alone

## 2015-04-24 NOTE — Patient Instructions (Signed)
Stop gabapentin Use tramadol as needed for cough We will refer you to the [facility] at [facility] for further evaluation of your chronic irritable larynx syndrome We will see you back in 3 months or sooner if needed

## 2015-04-24 NOTE — Assessment & Plan Note (Signed)
This continues to be a problem and was not effectively treated with the addition of gabapentin. We feel that he has irritable larynx syndrome as there is no appreciable postnasal drip or acid reflux. I do not think that the trace findings of mild interstitial lung disease or his mild COPD are enough to explain his cough.  Plan: ENT evaluation with Dr. Delford Field at [facility] to evaluate for a laryngeal cause of his cough

## 2015-04-25 ENCOUNTER — Encounter: Payer: Self-pay | Admitting: Nurse Practitioner

## 2015-04-25 ENCOUNTER — Ambulatory Visit (INDEPENDENT_AMBULATORY_CARE_PROVIDER_SITE_OTHER): Payer: Medicare Other | Admitting: Nurse Practitioner

## 2015-04-25 VITALS — BP 128/84 | HR 85 | Ht 67.0 in | Wt 201.6 lb

## 2015-04-25 DIAGNOSIS — G4733 Obstructive sleep apnea (adult) (pediatric): Secondary | ICD-10-CM | POA: Diagnosis not present

## 2015-04-25 DIAGNOSIS — G2581 Restless legs syndrome: Secondary | ICD-10-CM

## 2015-04-25 DIAGNOSIS — R209 Unspecified disturbances of skin sensation: Secondary | ICD-10-CM

## 2015-04-25 DIAGNOSIS — Z9989 Dependence on other enabling machines and devices: Principal | ICD-10-CM

## 2015-04-25 MED ORDER — PRAMIPEXOLE DIHYDROCHLORIDE 0.5 MG PO TABS: ORAL_TABLET | ORAL | Status: DC

## 2015-04-25 NOTE — Patient Instructions (Signed)
Continue Mirapex at current dose will refill Given copy of EMG nerve conduction report Follow-up in 6-8 months

## 2015-04-25 NOTE — Progress Notes (Signed)
[facility]  PATIENT: Jeffrey Cobb DOB: [birth year]   REASON FOR VISIT: Follow-up for restless leg syndrome , paresthesias of the feet, obstructive sleep apnea, chronic insomnia HISTORY FROM: Patient    HISTORY OF PRESENT ILLNESS:[name], 74 year old male returns for followup. He has a history of restless leg syndrome and sensory dysesthesias. He also has a history of obstructive sleep apnea with good compliance of CPAP in the past however due to his chronic cough in  October last year which did not  respond to antibiotics or prednisone. He has been unable to use his CPAP since that time . He was referred to ENT and now to a larynx specialist at [facility]. On today's visit he also reports continued  numbness, tingling/burning and discomfort in his feet, this is been an ongoing problem but it has worsened. EMG nerve conduction after last visit was normal. He has been placed on a trial of Neurontin by his primary care however had side effects of dizziness on the medication.He has a history of spinal stenosis and lumbar spine decompression 2011 by Dr. Gerlene Fee. He has had 2 epidural injections since last seen with minimal benefit. He denies any falls however he does feel off balance at times. He has intermittent back pain since his surgery. His restless legs are under good control with his Mirapex. He returns for reevaluation   HISTORY: of sensory dysesthesias, restless legs and insomnia.  MRI of the spine (lumbar) that he had done in April of 2009 showed moderate to severe spinal stenosis. He had lumbar spine decompression and fusion by Dr. Gerlene Fee in 2011, did very well. He also has a history of right and left knee replacements and a shoulder replacement. He is currently taking Mirapex for his restless legs tolerating the medication without drowsiness or any type of compulsive behaviors. iron profile was normal.     REVIEW OF SYSTEMS: Full 14 system review of  systems performed and notable only for those listed, all others are neg:  Constitutional: neg  Cardiovascular: neg Ear/Nose/Throat: Ringing in the ears Skin: neg Eyes: neg Respiratory: Cough Gastroitestinal: neg  Hematology/Lymphatic: neg  Endocrine: Intolerance to cold Musculoskeletal: Joint pain and back pain Allergy/Immunology: neg Neurological: neg Psychiatric: neg Sleep : Obstructive sleep apnea currently not using CPAP, restless legs   ALLERGIES: Allergies  Allergen Reactions  . Gabapentin     Dizziness   . Sulfonamide Derivatives Rash  . Trazodone And Nefazodone Rash and Other (See Comments)    Hallucinations     HOME MEDICATIONS: Outpatient Prescriptions Prior to Visit  Medication Sig Dispense Refill  . doxazosin (CARDURA) 2 MG tablet Take 2 mg by mouth daily.     . fluticasone (FLONASE) 50 MCG/ACT nasal spray Place 50 sprays into both nostrils as needed.  3  . LEVITRA 20 MG tablet Take 20 mg by mouth daily as needed for erectile dysfunction.     . pantoprazole (PROTONIX) 40 MG tablet Take 1 tablet (40 mg total) by mouth 2 (two) times daily. 180 tablet 1  . pramipexole (MIRAPEX) 0.5 MG tablet 1 tab in the a.m and 1 tab in the p.m 180 tablet 3  . PROAIR HFA 108 (90 BASE) MCG/ACT inhaler Inhale 2 puffs into the lungs every 6 (six) hours as needed.    . traMADol (ULTRAM) 50 MG tablet Take 1 tablet (50 mg total) by mouth every 6 (six) hours as needed (cough). 40 tablet 0  . zolpidem (AMBIEN) 10 MG tablet Take 10  mg by mouth at bedtime as needed.  0   No facility-administered medications prior to visit.    PAST MEDICAL HISTORY: Past Medical History  Diagnosis Date  . Occlusion and stenosis of carotid artery without mention of cerebral infarction     a. 05/2013 Carotid U/S: 1-39% bilat stenosis.  . Obstructive chronic bronchitis with exacerbation   . Other emphysema   . Mixed hyperlipidemia   . Persistent disorder of initiating or maintaining sleep   . Acute  sinusitis, unspecified   . GERD (gastroesophageal reflux disease)   . Arthritis   . Sleep apnea     a. on CPAP.    PAST SURGICAL HISTORY: Past Surgical History  Procedure Laterality Date  . Spine surgery  2000/2011  . Appendectomy  1956  . Right ankle reconstruction  1986  . Ruptured disk  2000  . Left knee replacement  2001  . Right knee replacement  2005  . Joint replacement Right     shoulder replacement  . Eye surgery Bilateral     cataract removal  . Torn retina    . 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 Emphysema Mother   . Heart disease Mother   . Heart disease Father   . Allergies      FH: FATHER,2 SISTER,1 BROTHER, SON  DAUGHTER  . Cancer Sister   . Alzheimer's disease Mother     SOCIAL HISTORY: Social History   Social History  . Marital Status: Married    Spouse Name: Jeffrey Cobb   . Number of Children: 2  . Years of Education: Assoc    Occupational History  . Retired    Social History Main Topics  . Smoking status: Former Smoker -- 3.00 packs/day for 17 years    Types: Cigarettes    Quit date: 08/26/1977  . Smokeless tobacco: Never Used  . Alcohol Use: Yes     Comment: rarely  . Drug Use: No  . Sexual Activity: Not on file   Other Topics Concern  . Not on file   Social History Narrative   Patient is married Jeffrey Cobb) and lives at home with his wife.   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    Patient is left handed   Patient drinks 1-2 cups of coffee in the morning and 3-4 cups of tea daily.                 PHYSICAL EXAM  Filed Vitals:   04/25/15 0929  BP: 128/84  Pulse: 85  Height: 5\' 7"  (1.702 m)  Weight: 201 lb 9.6 oz (91.445 kg)   Body mass index is 31.57 kg/(m^2). Generalized: Well  developed, Mildly obese male in no acute distress  Head: normocephalic and atraumatic,. Oropharynx benign  Neck: Supple, no carotid bruits  Cardiac: Regular rate rhythm, no murmur  Musculoskeletal: Arthritic changes in hands  Neurological examination   Mentation: Alert oriented to time, place, history taking. Follows all commands speech and language fluent.  Cranial nerve II-XII: Pupils were equal round reactive to light extraocular movements were full, visual field were full on confrontational test. Facial sensation and strength were normal. hearing was intact to finger rubbing bilaterally. Uvula tongue midline. head turning and shoulder shrug  were normal and symmetric.Tongue protrusion into cheek strength was normal. Motor: normal bulk and tone, full strength in the BUE, BLE, fine finger movements normal, no pronator drift. No focal weakness Sensory: Mildly decreased light touch and pinprick to mid shin bilaterally, vibration normal. Proprioception normal  Coordination: finger-nose-finger, heel-to-shin bilaterally, no dysmetria Reflexes: Brachioradialis 2/2, biceps 2/2, triceps 2/2, patellar 0/0, Achilles 1/1, plantar responses were flexor bilaterally. Gait and Station: Rising up from seated position without assistance, normal stance, moderate stride, good arm swing, smooth turning, able to perform tiptoe, and heel walking without difficulty. Tandem gait is steady. No assistive device    DIAGNOSTIC DATA (LABS, IMAGING, TESTING) - I reviewed patient records, labs, notes, testing and imaging myself where available.  Lab Results  Component Value Date   WBC 8.2 09/02/2014   HGB 14.6 09/02/2014   HCT 44.6 09/02/2014   MCV 92.3 09/02/2014   PLT 139.0* 09/02/2014      ASSESSMENT AND PLAN 74 y.o. year old male has a past medical history of restless leg syndrome Occlusion and stenosis of carotid artery without mention of cerebral infarction; Obstructive chronic bronchitis with  exacerbation; Other emphysema; Mixed hyperlipidemia; Persistent disorder of initiating or maintaining sleep; Sleep apnea. and increased paresthesias in both feet bilaterally. He also has a history of restless leg syndrome. The patient is a current patient of Dr. Vickey Huger  who is out of the office at present. This note is sent to the work in doctor.     Continue Mirapex at current dose will refill Given copy of EMG nerve conduction report which was normal no evidence of peripheral neuropathy, no evidence of overlying lumbosacral radiculopathy was seen. Patient has tried gabapentin for his burning sensation in the feet, he does not wish to try another medication at this time He will continue Ambien for his chronic insomnia Follow-up in 6-8 months Nilda Riggs, [facility], [facility], APRN  [facility] [address] [place], [place] [address] [phone]

## 2015-04-25 NOTE — Progress Notes (Signed)
I reviewed note and agree with plan.   Letonia Stead R. Reizel Calzada, MD 04/25/2015, 4:33 PM Certified in Neurology, Neurophysiology and Neuroimaging  [facility] [address], [address] [place], [place] [address] [phone]  

## 2015-05-03 DIAGNOSIS — R059 Cough, unspecified: Secondary | ICD-10-CM | POA: Insufficient documentation

## 2015-05-03 DIAGNOSIS — K219 Gastro-esophageal reflux disease without esophagitis: Secondary | ICD-10-CM | POA: Insufficient documentation

## 2015-05-03 DIAGNOSIS — J387 Other diseases of larynx: Secondary | ICD-10-CM | POA: Insufficient documentation

## 2015-05-03 DIAGNOSIS — R05 Cough: Secondary | ICD-10-CM | POA: Insufficient documentation

## 2015-05-27 DIAGNOSIS — I639 Cerebral infarction, unspecified: Secondary | ICD-10-CM

## 2015-05-27 HISTORY — DX: Cerebral infarction, unspecified: I63.9

## 2015-05-31 ENCOUNTER — Encounter (HOSPITAL_BASED_OUTPATIENT_CLINIC_OR_DEPARTMENT_OTHER): Payer: Self-pay | Admitting: *Deleted

## 2015-05-31 ENCOUNTER — Emergency Department (HOSPITAL_BASED_OUTPATIENT_CLINIC_OR_DEPARTMENT_OTHER): Payer: Medicare Other

## 2015-05-31 ENCOUNTER — Observation Stay (HOSPITAL_BASED_OUTPATIENT_CLINIC_OR_DEPARTMENT_OTHER)
Admission: EM | Admit: 2015-05-31 | Discharge: 2015-06-02 | Disposition: A | Discharge status: Home / Self Care | Payer: Medicare Other | Attending: Internal Medicine | Admitting: Internal Medicine

## 2015-05-31 DIAGNOSIS — I6521 Occlusion and stenosis of right carotid artery: Secondary | ICD-10-CM | POA: Diagnosis not present

## 2015-05-31 DIAGNOSIS — E782 Mixed hyperlipidemia: Secondary | ICD-10-CM | POA: Diagnosis not present

## 2015-05-31 DIAGNOSIS — E876 Hypokalemia: Secondary | ICD-10-CM | POA: Insufficient documentation

## 2015-05-31 DIAGNOSIS — I739 Peripheral vascular disease, unspecified: Secondary | ICD-10-CM

## 2015-05-31 DIAGNOSIS — G459 Transient cerebral ischemic attack, unspecified: Principal | ICD-10-CM | POA: Insufficient documentation

## 2015-05-31 DIAGNOSIS — Z79899 Other long term (current) drug therapy: Secondary | ICD-10-CM | POA: Diagnosis not present

## 2015-05-31 DIAGNOSIS — G4733 Obstructive sleep apnea (adult) (pediatric): Secondary | ICD-10-CM | POA: Diagnosis present

## 2015-05-31 DIAGNOSIS — R4701 Aphasia: Secondary | ICD-10-CM | POA: Diagnosis not present

## 2015-05-31 DIAGNOSIS — G2581 Restless legs syndrome: Secondary | ICD-10-CM | POA: Insufficient documentation

## 2015-05-31 DIAGNOSIS — J438 Other emphysema: Secondary | ICD-10-CM

## 2015-05-31 DIAGNOSIS — K219 Gastro-esophageal reflux disease without esophagitis: Secondary | ICD-10-CM | POA: Insufficient documentation

## 2015-05-31 DIAGNOSIS — J439 Emphysema, unspecified: Secondary | ICD-10-CM | POA: Insufficient documentation

## 2015-05-31 DIAGNOSIS — G4710 Hypersomnia, unspecified: Secondary | ICD-10-CM | POA: Insufficient documentation

## 2015-05-31 DIAGNOSIS — Z87891 Personal history of nicotine dependence: Secondary | ICD-10-CM | POA: Insufficient documentation

## 2015-05-31 DIAGNOSIS — J449 Chronic obstructive pulmonary disease, unspecified: Secondary | ICD-10-CM | POA: Diagnosis present

## 2015-05-31 DIAGNOSIS — G9340 Encephalopathy, unspecified: Secondary | ICD-10-CM

## 2015-05-31 DIAGNOSIS — N400 Enlarged prostate without lower urinary tract symptoms: Secondary | ICD-10-CM | POA: Diagnosis not present

## 2015-05-31 DIAGNOSIS — Z7951 Long term (current) use of inhaled steroids: Secondary | ICD-10-CM | POA: Insufficient documentation

## 2015-05-31 DIAGNOSIS — Z9989 Dependence on other enabling machines and devices: Secondary | ICD-10-CM

## 2015-05-31 DIAGNOSIS — G9349 Other encephalopathy: Secondary | ICD-10-CM | POA: Insufficient documentation

## 2015-05-31 DIAGNOSIS — I779 Disorder of arteries and arterioles, unspecified: Secondary | ICD-10-CM | POA: Diagnosis present

## 2015-05-31 LAB — BASIC METABOLIC PANEL
ANION GAP: 7 (ref 5–15)
BUN: 12 mg/dL (ref 6–20)
CALCIUM: 8.9 mg/dL (ref 8.9–10.3)
CO2: 25 mmol/L (ref 22–32)
CREATININE: 1.18 mg/dL (ref 0.61–1.24)
Chloride: 98 mmol/L — ABNORMAL LOW (ref 101–111)
GFR, EST NON AFRICAN AMERICAN: 59 mL/min — AB (ref >60)
Glucose, Bld: 94 mg/dL (ref 65–99)
Potassium: 3.7 mmol/L (ref 3.5–5.1)
Sodium: 130 mmol/L — ABNORMAL LOW (ref 135–145)

## 2015-05-31 LAB — CBC
HCT: 41.5 % (ref 39.0–52.0)
Hemoglobin: 14.6 g/dL (ref 13.0–17.0)
MCH: 31.5 pg (ref 26.0–34.0)
MCHC: 35.2 g/dL (ref 30.0–36.0)
MCV: 89.4 fL (ref 78.0–100.0)
PLATELETS: 138 10*3/uL — AB (ref 150–400)
RBC: 4.64 MIL/uL (ref 4.22–5.81)
RDW: 13.4 % (ref 11.5–15.5)
WBC: 6.7 10*3/uL (ref 4.0–10.5)

## 2015-05-31 LAB — APTT: APTT: 31 s (ref 24–37)

## 2015-05-31 LAB — PROTIME-INR
INR: 1.09 (ref 0.00–1.49)
PROTHROMBIN TIME: 14.3 s (ref 11.6–15.2)

## 2015-05-31 LAB — TROPONIN I: Troponin I: <0.03 ng/mL (ref <0.031)

## 2015-05-31 MED ORDER — ASPIRIN 325 MG PO TABS
325.0000 mg | ORAL_TABLET | Freq: Every day | ORAL | Status: DC
  Administered 2015-06-01: 325 mg via ORAL
  Filled 2015-05-31: qty 1

## 2015-05-31 MED ORDER — SENNOSIDES-DOCUSATE SODIUM 8.6-50 MG PO TABS: 1.0000 | ORAL_TABLET | Freq: Every evening | ORAL | Status: DC | PRN

## 2015-05-31 MED ORDER — ATORVASTATIN CALCIUM 40 MG PO TABS
40.0000 mg | ORAL_TABLET | Freq: Every day | ORAL | Status: DC
Start: 2015-06-01 — End: 2015-06-02
  Administered 2015-06-01: 40 mg via ORAL
  Filled 2015-05-31: qty 1

## 2015-05-31 MED ORDER — FLUTICASONE PROPIONATE 50 MCG/ACT NA SUSP
1.0000 | NASAL | Status: DC | PRN
  Filled 2015-05-31: qty 16

## 2015-05-31 MED ORDER — STROKE: EARLY STAGES OF RECOVERY BOOK
Freq: Once | Status: AC
  Administered 2015-06-01

## 2015-05-31 MED ORDER — PANTOPRAZOLE SODIUM 40 MG PO TBEC
40.0000 mg | DELAYED_RELEASE_TABLET | Freq: Two times a day (BID) | ORAL | Status: DC
  Administered 2015-06-01 – 2015-06-02 (×3): 40 mg via ORAL
  Filled 2015-05-31 (×3): qty 1

## 2015-05-31 MED ORDER — DOXAZOSIN MESYLATE 2 MG PO TABS
2.0000 mg | ORAL_TABLET | Freq: Every day | ORAL | Status: DC
  Administered 2015-06-01: 2 mg via ORAL
  Filled 2015-05-31 (×2): qty 1

## 2015-05-31 MED ORDER — HEPARIN SODIUM (PORCINE) 5000 UNIT/ML IJ SOLN
5000.0000 [IU] | Freq: Three times a day (TID) | INTRAMUSCULAR | Status: DC
  Administered 2015-06-01 (×3): 5000 [IU] via SUBCUTANEOUS
  Filled 2015-05-31 (×4): qty 1

## 2015-05-31 MED ORDER — SODIUM CHLORIDE 0.9 % IV BOLUS (SEPSIS)
1000.0000 mL | Freq: Once | INTRAVENOUS | Status: AC
Start: 2015-05-31 — End: 2015-05-31
  Administered 2015-05-31: 1000 mL via INTRAVENOUS

## 2015-05-31 MED ORDER — PRAMIPEXOLE DIHYDROCHLORIDE 0.125 MG PO TABS
0.5000 mg | ORAL_TABLET | Freq: Two times a day (BID) | ORAL | Status: DC
  Administered 2015-06-01 – 2015-06-02 (×3): 0.5 mg via ORAL
  Filled 2015-05-31 (×3): qty 4

## 2015-05-31 MED ORDER — ASPIRIN 300 MG RE SUPP
300.0000 mg | Freq: Every day | RECTAL | Status: DC
Start: 2015-06-01 — End: 2015-06-01

## 2015-05-31 MED ORDER — ZOLPIDEM TARTRATE 5 MG PO TABS: 5.0000 mg | ORAL_TABLET | Freq: Every evening | ORAL | Status: DC | PRN

## 2015-05-31 MED ORDER — ALBUTEROL SULFATE (2.5 MG/3ML) 0.083% IN NEBU: 3.0000 mL | INHALATION_SOLUTION | Freq: Four times a day (QID) | RESPIRATORY_TRACT | Status: DC | PRN

## 2015-05-31 NOTE — ED Notes (Addendum)
Wife states pt became very confused 30 min ago. C/o h/a over left eye intermittantly at triage. Denies visual changes. Wife states pt was having trouble finding words at home.  Wife also states pt was stumbling in the house. Pt had been working in yard earlier.

## 2015-05-31 NOTE — Consult Note (Signed)
Admission H&P    Chief Complaint: Transient word finding difficulty and dysarthria.  HPI: Jeffrey Cobb is an 74 y.o. male with a history of hyperlipidemia, right carotid stenosis, arthritis and sleep apnea, presenting following an episode of word finding difficulty as well as slurred speech. He was last known well at 4:30 PM today. Deficits lasted about 45 minutes then resolved. He has no previous history of stroke nor TIA. He was seen initially at [facility] and subsequently transferred to [facility] for further management. He has not been on antiplatelets therapy. CT scan of his head showed no acute intracranial abnormality. NIH stroke score at the time of this evaluation was 0.  LSN: 4:30 PM on 05/31/2015 tPA Given: No: Deficits resolved rapidly. mRankin:  Past Medical History  Diagnosis Date  . Occlusion and stenosis of carotid artery without mention of cerebral infarction     a. 05/2013 Carotid U/S: 1-39% bilat stenosis.  . Obstructive chronic bronchitis with exacerbation ([place])   . Other emphysema ([place])   . Mixed hyperlipidemia   . Persistent disorder of initiating or maintaining sleep   . Acute sinusitis, unspecified   . GERD (gastroesophageal reflux disease)   . Arthritis   . Sleep apnea     a. on CPAP.    Past Surgical History  Procedure Laterality Date  . Spine surgery  2000/2011  . Appendectomy  1956  . Right ankle reconstruction  1986  . Ruptured disk  2000  . Left knee replacement  2001  . Right knee replacement  2005  . Joint replacement Right     shoulder replacement  . Eye surgery Bilateral     cataract removal  . Torn retina    . Total shoulder arthroplasty Left 06/30/2013    Procedure: TOTAL SHOULDER ARTHROPLASTY;  Surgeon: Ninetta Lights, MD;  Location: [facility];  Service: Orthopedics;  Laterality: Left;    Family History  Problem Relation Age of Onset  . Emphysema Mother   . Heart disease Mother   . Heart disease Father   . Allergies      FH: FATHER,2 SISTER,1  BROTHER, SON  DAUGHTER  . Cancer Sister   . Alzheimer's disease Mother    Social History:  reports that he quit smoking about 37 years ago. His smoking use included Cigarettes. He has a 51 pack-year smoking history. He has never used smokeless tobacco. He reports that he drinks alcohol. He reports that he does not use illicit drugs.  Allergies:  Allergies  Allergen Reactions  . Gabapentin     Dizziness   . Sulfonamide Derivatives Rash  . Trazodone And Nefazodone Rash and Other (See Comments)    Hallucinations     Medications Prior to Admission  Medication Sig Dispense Refill  . doxazosin (CARDURA) 2 MG tablet Take 2 mg by mouth daily.     . fluticasone (FLONASE) 50 MCG/ACT nasal spray Place 50 sprays into both nostrils as needed.  3  . pantoprazole (PROTONIX) 40 MG tablet Take 1 tablet (40 mg total) by mouth 2 (two) times daily. 180 tablet 1  . pramipexole (MIRAPEX) 0.5 MG tablet 1 tab in the a.m and 1 tab in the p.m 180 tablet 3  . PROAIR HFA 108 (90 BASE) MCG/ACT inhaler Inhale 2 puffs into the lungs every 6 (six) hours as needed.    . zolpidem (AMBIEN) 10 MG tablet Take 10 mg by mouth at bedtime as needed.  0    ROS: History obtained from spouse and  the patient  General ROS: negative for - chills, fatigue, fever, night sweats, weight gain or weight loss Psychological ROS: negative for - behavioral disorder, hallucinations, memory difficulties, mood swings or suicidal ideation Ophthalmic ROS: negative for - blurry vision, double vision, eye pain or loss of vision ENT ROS: negative for - epistaxis, nasal discharge, oral lesions, sore throat, tinnitus or vertigo Allergy and Immunology ROS: negative for - hives or itchy/watery eyes Hematological and Lymphatic ROS: negative for - bleeding problems, bruising or swollen lymph nodes Endocrine ROS: negative for - galactorrhea, hair pattern changes, polydipsia/polyuria or temperature intolerance Respiratory ROS: negative for - cough,  hemoptysis, shortness of breath or wheezing Cardiovascular ROS: negative for - chest pain, dyspnea on exertion, edema or irregular heartbeat Gastrointestinal ROS: Occasional nausea, otherwise unremarkable Genito-Urinary ROS: negative for - dysuria, hematuria, incontinence or urinary frequency/urgency Musculoskeletal ROS: negative for - joint swelling or muscular weakness Neurological ROS: as noted in HPI Dermatological ROS: negative for rash and skin lesion changes  Physical Examination: Blood pressure 153/85, pulse 65, temperature 98.2 F (36.8 C), temperature source Oral, resp. rate 18, height _0  (1.753 m), weight 90.719 kg (200 lb), SpO2 97 %.  HEENT-  Normocephalic, no lesions, without obvious abnormality.  Normal external eye and conjunctiva.  Normal TM's bilaterally.  Normal auditory canals and external ears. Normal external nose, mucus membranes and septum.  Normal pharynx. Neck supple with no masses, nodes, nodules or enlargement. Cardiovascular - regular rate and rhythm, S1, S2 normal, no murmur, click, rub or gallop Lungs - chest clear, no wheezing, rales, normal symmetric air entry Abdomen - soft, non-tender; bowel sounds normal; no masses,  no organomegaly Extremities - no joint deformities, effusion, or inflammation and no edema  Neurologic Examination: Mental Status: Alert, oriented, thought content appropriate.  Speech fluent without evidence of aphasia. Able to follow commands without difficulty. Cranial Nerves: II-Visual fields were normal. III/IV/VI-Pupils were equal and reacted normally to light. Extraocular movements were full and conjugate.    V/VII-no facial numbness and no facial weakness. VIII-normal. X-normal speech and symmetrical palatal movement. XI: trapezius strength/neck flexion strength normal bilaterally XII-midline tongue extension with normal strength. Motor: 5/5 bilaterally with normal tone and bulk Sensory: Normal throughout. Deep Tendon  Reflexes: Trace only in upper extremities and absent in lower extremities. Plantars: Flexor bilaterally Cerebellar: Normal finger-to-nose testing. Carotid auscultation: Normal  Results for orders placed or performed during the hospital encounter of 05/31/15 (from the past 48 hour(s))  CBC     Status: Abnormal   Collection Time: 05/31/15  5:30 PM  Result Value Ref Range   WBC 6.7 4.0 - 10.5 K/uL   RBC 4.64 4.22 - 5.81 MIL/uL   Hemoglobin 14.6 13.0 - 17.0 g/dL   HCT 41.5 39.0 - 52.0 %   MCV 89.4 78.0 - 100.0 fL   MCH 31.5 26.0 - 34.0 pg   MCHC 35.2 30.0 - 36.0 g/dL   RDW 13.4 11.5 - 15.5 %   Platelets 138 (L) 150 - 400 K/uL  Basic metabolic panel     Status: Abnormal   Collection Time: 05/31/15  5:30 PM  Result Value Ref Range   Sodium 130 (L) 135 - 145 mmol/L   Potassium 3.7 3.5 - 5.1 mmol/L   Chloride 98 (L) 101 - 111 mmol/L   CO2 25 22 - 32 mmol/L   Glucose, Bld 94 65 - 99 mg/dL   BUN 12 6 - 20 mg/dL   Creatinine, Ser 1.18 0.61 - 1.24 mg/dL  Calcium 8.9 8.9 - 10.3 mg/dL   GFR calc non Af Amer 59 (L) >60 mL/min   GFR calc Af Amer >60 >60 mL/min    Comment: (NOTE) The eGFR has been calculated using the CKD EPI equation. This calculation has not been validated in all clinical situations. eGFR's persistently <60 mL/min signify possible Chronic Kidney Disease.    Anion gap 7 5 - 15  Troponin I     Status: None   Collection Time: 05/31/15  5:30 PM  Result Value Ref Range   Troponin I <0.03 <0.031 ng/mL    Comment:        NO INDICATION OF MYOCARDIAL INJURY.    Ct Head Wo Contrast  05/31/2015   CLINICAL DATA:  Sudden onset of memory difficulties and dizziness. Aphasia. Dysarthria.  EXAM: CT HEAD WITHOUT CONTRAST  TECHNIQUE: Contiguous axial images were obtained from the base of the skull through the vertex without intravenous contrast.  COMPARISON:  04/27/2010 cervical spine MRI  FINDINGS: The brainstem, cerebellum, cerebral peduncles, thalamus, basal ganglia, basilar  cisterns, and ventricular system appear within normal limits. No intracranial hemorrhage, mass lesion, or acute CVA.  Mild mucosal thickening in left maxillary sinus. There is atherosclerotic calcification of the cavernous carotid arteries bilaterally.  IMPRESSION: 1. No acute intracranial findings. 2. Mild chronic left maxillary sinusitis.   Electronically Signed   By: Van Clines M.D.   On: 05/31/2015 18:29    Assessment: 74 y.o. male presenting with probable transient ischemic attack manifested as expressive aphasia and dysarthria.  Stroke Risk Factors - hyperlipidemia  Plan: 1. HgbA1c, fasting lipid panel 2. MRI, MRA  of the brain without contrast 3. PT consult, OT consult, Speech consult 4. Echocardiogram 5. Carotid dopplers 6. Prophylactic therapy-Antiplatelet med: Aspirin  7. Risk factor modification 8. Telemetry monitoring  C.R. Nicole Kindred, MD [facility] [phone]  05/31/2015, 10:46 PM

## 2015-05-31 NOTE — Progress Notes (Signed)
Pt arrived at 20[facility] [place]. Alert and oriented x4. Denies any Pain. Oriented to room. Will continue to monitor.

## 2015-05-31 NOTE — H&P (Signed)
[facility] History and Physical  Jeffrey Cobb ZOX:[id number] DOB: 01-Apr-1941 DOA: 05/31/2015  Referring physician: ED physician PCP:  Duane Lope, MD  Specialists:   Chief Complaint: Altered mental status, and aphasia  HPI: Jeffrey Cobb is a 74 y.o. male with PMH of lipidemia, GERD, carotid artery stenosis, COPD, OSA on CPAP, arthritis, RLS, BPH, who presents with altered mental status and aphasia.  Per patient's wife, pt became confused at about 4 PM. He could not remember his bank account when he tried to manage his bank account. He also had slurred speech, difficult speaking. He did not have weakness, numbness in his extremities, no vision change or hearing loss. The symptoms lasted for approximately 45 minutes, then resolved spontaneously. Patient has mild chronic shortness of breath, which has not changed. No chest pain, cough, fever, chills, abdominal pain, diarrhea, symptoms of UTI.  In ED, patient was found to have negative CT head for acute intracranial abnormalities, WBC 6.7, troponin negative, temperature normal, no tachycardia, electrolytes okay. Patient is admitted to inpatient for further intervention treatment. Neurology was consulted.  Where does patient live?   At home  Can patient participate in ADLs?  Yes    Review of Systems:   General: no fevers, chills, no changes in body weight, has fatigue HEENT: no blurry vision, hearing changes or sore throat Pulm: has mild dyspnea, no coughing, wheezing CV: no chest pain, palpitations Abd: no nausea, vomiting, abdominal pain, diarrhea, constipation GU: no dysuria, burning on urination, increased urinary frequency, hematuria  Ext: no leg edema Neuro: no unilateral weakness, numbness, or tingling, no vision change or hearing loss. Had confusion, slurred speech, aphasia Skin: no rash MSK: No muscle spasm, no deformity, no limitation of range of movement in spin Heme: No easy bruising.  Travel history: No recent  long distant travel.  Allergy:  Allergies  Allergen Reactions  . Gabapentin     Dizziness   . Sulfonamide Derivatives Rash  . Trazodone And Nefazodone Rash and Other (See Comments)    Hallucinations     Past Medical History  Diagnosis Date  . Occlusion and stenosis of carotid artery without mention of cerebral infarction     a. 05/2013 Carotid U/S: 1-39% bilat stenosis.  . Obstructive chronic bronchitis with exacerbation ([facility])   . Other emphysema ([facility])   . Mixed hyperlipidemia   . Persistent disorder of initiating or maintaining sleep   . Acute sinusitis, unspecified   . GERD (gastroesophageal reflux disease)   . Arthritis   . Sleep apnea     a. on CPAP.    Past Surgical History  Procedure Laterality Date  . Spine surgery  2000/2011  . Appendectomy  1956  . Right ankle reconstruction  1986  . Ruptured disk  2000  . Left knee replacement  2001  . Right knee replacement  2005  . Joint replacement Right     shoulder replacement  . Eye surgery Bilateral     cataract removal  . Torn retina    . Total shoulder arthroplasty Left 06/30/2013    Procedure: TOTAL SHOULDER ARTHROPLASTY;  Surgeon: Loreta Ave, MD;  Location: [facility] OR;  Service: Orthopedics;  Laterality: Left;    Social History:  reports that he quit smoking about 37 years ago. His smoking use included Cigarettes. He has a 51 pack-year smoking history. He has never used smokeless tobacco. He reports that he drinks alcohol. He reports that he does not use illicit drugs.  Family History:  Family  History  Problem Relation Age of Onset  . Emphysema Mother   . Heart disease Mother   . Heart disease Father   . Allergies      FH: FATHER,2 SISTER,1 BROTHER, SON  DAUGHTER  . Cancer Sister   . Alzheimer's disease Mother      Prior to Admission medications   Medication Sig Start Date End Date Taking? Authorizing Provider  doxazosin (CARDURA) 2 MG tablet Take 2 mg by mouth daily.  08/22/11  Yes Historical Provider,  MD  fluticasone (FLONASE) 50 MCG/ACT nasal spray Place 50 sprays into both nostrils as needed. 05/31/14  Yes Historical Provider, MD  pantoprazole (PROTONIX) 40 MG tablet Take 1 tablet (40 mg total) by mouth 2 (two) times daily. 09/02/14  Yes Barbaraann Share, MD  pramipexole (MIRAPEX) 0.5 MG tablet 1 tab in the a.m and 1 tab in the p.m 04/25/15  Yes Nilda Riggs, NP  [facility] HFA 108 (90 BASE) MCG/ACT inhaler Inhale 2 puffs into the lungs every 6 (six) hours as needed. 01/28/14  Yes Historical Provider, MD  zolpidem (AMBIEN) 10 MG tablet Take 10 mg by mouth at bedtime as needed. 09/25/14  Yes Historical Provider, MD    Physical Exam: Filed Vitals:   06/01/15 0000 06/01/15 0100 06/01/15 0200 06/01/15 0300  BP:  129/72 129/72 134/78  Pulse:  58 58 67  Temp:  98.2 F (36.8 C) 98.2 F (36.8 C) 98.6 F (37 C)  TempSrc:  Oral Oral Oral  Resp: 18 18 18 18   Height:      Weight:      SpO2: 94% 92% 92% 93%   General: Not in acute distress HEENT:       Eyes: PERRL, EOMI, no scleral icterus.       ENT: No discharge from the ears and nose, no pharynx injection, no tonsillar enlargement.        Neck: No JVD, no bruit, no mass felt. Heme: No neck lymph node enlargement. Cardiac: S1/S2, RRR, No murmurs, No gallops or rubs. Pulm:  No rales, wheezing, rhonchi or rubs. Abd: Soft, nondistended, nontender, no rebound pain, no organomegaly, BS present. Ext: No pitting leg edema bilaterally. 2+DP/PT pulse bilaterally. Musculoskeletal: No joint deformities, No joint redness or warmth, no limitation of ROM in spin. Skin: No rashes.  Neuro: Alert, oriented X3, cranial nerves II-XII grossly intact, muscle strength 5/5 in all extremities, sensation to light touch intact. Brachial reflex 1+ bilaterally. Knee reflex 1+ bilaterally. Negative Babinski's sign. Normal finger to nose test. Psych: Patient is not psychotic, no suicidal or hemocidal ideation.  Labs on Admission:  Basic Metabolic Panel:  Recent  Labs Lab 05/31/15 1730  NA 130*  K 3.7  CL 98*  CO2 25  GLUCOSE 94  BUN 12  CREATININE 1.18  CALCIUM 8.9   Liver Function Tests: No results for input(s): AST, ALT, ALKPHOS, BILITOT, PROT, ALBUMIN in the last 168 hours. No results for input(s): LIPASE, AMYLASE in the last 168 hours. No results for input(s): AMMONIA in the last 168 hours. CBC:  Recent Labs Lab 05/31/15 1730  WBC 6.7  HGB 14.6  HCT 41.5  MCV 89.4  PLT 138*   Cardiac Enzymes:  Recent Labs Lab 05/31/15 1730  TROPONINI <0.03    BNP (last 3 results) No results for input(s): BNP in the last 8760 hours.  ProBNP (last 3 results)  Recent Labs  09/02/14 1204  PROBNP 25.0    CBG: No results for input(s): GLUCAP in the  last 168 hours.  Radiological Exams on Admission: Ct Head Wo Contrast  05/31/2015   CLINICAL DATA:  Sudden onset of memory difficulties and dizziness. Aphasia. Dysarthria.  EXAM: CT HEAD WITHOUT CONTRAST  TECHNIQUE: Contiguous axial images were obtained from the base of the skull through the vertex without intravenous contrast.  COMPARISON:  04/27/2010 cervical spine MRI  FINDINGS: The brainstem, cerebellum, cerebral peduncles, thalamus, basal ganglia, basilar cisterns, and ventricular system appear within normal limits. No intracranial hemorrhage, mass lesion, or acute CVA.  Mild mucosal thickening in left maxillary sinus. There is atherosclerotic calcification of the cavernous carotid arteries bilaterally.  IMPRESSION: 1. No acute intracranial findings. 2. Mild chronic left maxillary sinusitis.   Electronically Signed   By: Gaylyn Rong M.D.   On: 05/31/2015 18:29    EKG: Independently reviewed.  Abnormal findings: Incomplete right bundle blockage, LAD, U wave in precordial leads.   Assessment/Plan Principal Problem:   Aphasia Active Problems:   HYPERLIPIDEMIA TYPE IIB / III   Carotid disease, bilateral (HCC)   COPD (chronic obstructive pulmonary disease) (HCC)   RLS (restless  legs syndrome)   OSA on CPAP   TIA (transient ischemic attack)   Acute encephalopathy  Aphasia and slurred speech: This is most likely caused by TIA. CT head is negative for acute intracranial abnormalities. Neurology was consulted, Dr. Roseanne Reno evaluated the patient.  - will admit to tele bed - Appreciate Dr. Roseanne Reno consultation, will follow-up recommendations as the follows: - Risk factor modification: HgbA1c, fasting lipid panel  - MRI, MRA of the brain without contrast  - PT consult, OT consult, Speech consult  - 2 d Echocardiogram  - Ekg  - Carotid dopplers  - Aspirin 325 mg per day  - will start lipitor 40 mg daily  HLD: Last LDL was 90 on 01/17/11 -Check FLP -Started the Lipitor  Carotid disease, bilateral (HCC): - ASA  COPD (chronic obstructive pulmonary disease) (HCC): stable.  -When necessary albuterol   RLS (restless legs syndrome): -Appearing Paxil  OSA - on CPAP  BPH: stable - Continue Zardura   DVT ppx: SQ Heparin Code Status: Full code Family Communication:  Yes, patient's wife at bed side Disposition Plan: Admit to inpatient   Date of Service 06/01/2015    Lorretta Harp [facility] Pager [phone]  If 7PM-7AM, please contact night-coverage [email or web address] Password [email or web address] 06/01/2015, 5:18 AM

## 2015-05-31 NOTE — ED Notes (Signed)
MD at bedside. 

## 2015-05-31 NOTE — ED Notes (Signed)
Patient to treatment room via wheelchair, patient assisted with changing into a gown from waist up. 

## 2015-05-31 NOTE — ED Provider Notes (Signed)
CSN: [id number]     Arrival date & time 05/31/15  1657 History   First MD Initiated Contact with Patient 05/31/15 1715     Chief Complaint  Patient presents with  . Aphasia     (Consider location/radiation/quality/duration/timing/severity/associated sxs/prior Treatment) HPI Comments: Patient had an episode of word finding with dysarthria and aphasia that lasted about 15 minutes. This was after coming inside from work in the arm. No headache, loss of consciousness, chest pain, shortness of breath. He is doing better now per wife.  Patient is a 74 y.o. male presenting with neurologic complaint. The history is provided by the patient.  Neurologic Problem This is a new problem. The current episode started less than 1 hour ago. The problem occurs constantly. The problem has been resolved. Pertinent negatives include no chest pain, no abdominal pain and no shortness of breath. Nothing aggravates the symptoms. Nothing relieves the symptoms.    Past Medical History  Diagnosis Date  . Occlusion and stenosis of carotid artery without mention of cerebral infarction     a. 05/2013 Carotid U/S: 1-39% bilat stenosis.  . Obstructive chronic bronchitis with exacerbation (HCC)   . Other emphysema (HCC)   . Mixed hyperlipidemia   . Persistent disorder of initiating or maintaining sleep   . Acute sinusitis, unspecified   . GERD (gastroesophageal reflux disease)   . Arthritis   . Sleep apnea     a. on CPAP.   Past Surgical History  Procedure Laterality Date  . Spine surgery  2000/2011  . Appendectomy  1956  . Right ankle reconstruction  1986  . Ruptured disk  2000  . Left knee replacement  2001  . Right knee replacement  2005  . Joint replacement Right     shoulder replacement  . Eye surgery Bilateral     cataract removal  . Torn retina    . Total shoulder arthroplasty Left 06/30/2013    Procedure: TOTAL SHOULDER ARTHROPLASTY;  Surgeon: Loreta Ave, MD;  Location: [facility] OR;  Service:  Orthopedics;  Laterality: Left;   Family History  Problem Relation Age of Onset  . Emphysema Mother   . Heart disease Mother   . Heart disease Father   . Allergies      FH: FATHER,2 SISTER,1 BROTHER, SON  DAUGHTER  . Cancer Sister   . Alzheimer's disease Mother    Social History  Substance Use Topics  . Smoking status: Former Smoker -- 3.00 packs/day for 17 years    Types: Cigarettes    Quit date: 08/26/1977  . Smokeless tobacco: Never Used  . Alcohol Use: Yes     Comment: rarely    Review of Systems  Constitutional: Negative for fever.  Respiratory: Negative for cough and shortness of breath.   Cardiovascular: Negative for chest pain and leg swelling.  Gastrointestinal: Negative for vomiting and abdominal pain.  All other systems reviewed and are negative.     Allergies  Gabapentin; Sulfonamide derivatives; and Trazodone and nefazodone  Home Medications   Prior to Admission medications   Medication Sig Start Date End Date Taking? Authorizing Provider  doxazosin (CARDURA) 2 MG tablet Take 2 mg by mouth daily.  08/22/11  Yes Historical Provider, MD  fluticasone (FLONASE) 50 MCG/ACT nasal spray Place 50 sprays into both nostrils as needed. 05/31/14  Yes Historical Provider, MD  pantoprazole (PROTONIX) 40 MG tablet Take 1 tablet (40 mg total) by mouth 2 (two) times daily. 09/02/14  Yes Barbaraann Share, MD  pramipexole (  MIRAPEX) 0.5 MG tablet 1 tab in the a.m and 1 tab in the p.m 04/25/15  Yes Nilda Riggs, NP  [facility] HFA 108 (90 BASE) MCG/ACT inhaler Inhale 2 puffs into the lungs every 6 (six) hours as needed. 01/28/14  Yes Historical Provider, MD  zolpidem (AMBIEN) 10 MG tablet Take 10 mg by mouth at bedtime as needed. 09/25/14  Yes Historical Provider, MD   BP 148/89 mmHg  Pulse 70  Temp(Src) 97.6 F (36.4 C) (Oral)  Resp 18  Ht  (1.753 m)  Wt 200 lb (90.719 kg)  BMI 29.52 kg/m2  SpO2 98% Physical Exam  Constitutional: He is oriented to person, place, and  time. He appears well-developed and well-nourished. No distress.  HENT:  Head: Normocephalic and atraumatic.  Mouth/Throat: No oropharyngeal exudate.  Eyes: EOM are normal. Pupils are equal, round, and reactive to light.  Neck: Normal range of motion. Neck supple.  Cardiovascular: Normal rate and regular rhythm.  Exam reveals no friction rub.   No murmur heard. Pulmonary/Chest: Effort normal and breath sounds normal. No respiratory distress. He has no wheezes. He has no rales.  Abdominal: He exhibits no distension. There is no tenderness. There is no rebound.  Musculoskeletal: Normal range of motion. He exhibits no edema.  Neurological: He is alert and oriented to person, place, and time.  Skin: He is not diaphoretic.    ED Course  Procedures (including critical care time) Labs Review Labs Reviewed  CBC - Abnormal; Notable for the following:    Platelets 138 (*)    All other components within normal limits  BASIC METABOLIC PANEL - Abnormal; Notable for the following:    Sodium 130 (*)    Chloride 98 (*)    GFR calc non Af Amer 59 (*)    All other components within normal limits  TROPONIN I    Imaging Review Ct Head Wo Contrast  05/31/2015   CLINICAL DATA:  Sudden onset of memory difficulties and dizziness. Aphasia. Dysarthria.  EXAM: CT HEAD WITHOUT CONTRAST  TECHNIQUE: Contiguous axial images were obtained from the base of the skull through the vertex without intravenous contrast.  COMPARISON:  04/27/2010 cervical spine MRI  FINDINGS: The brainstem, cerebellum, cerebral peduncles, thalamus, basal ganglia, basilar cisterns, and ventricular system appear within normal limits. No intracranial hemorrhage, mass lesion, or acute CVA.  Mild mucosal thickening in left maxillary sinus. There is atherosclerotic calcification of the cavernous carotid arteries bilaterally.  IMPRESSION: 1. No acute intracranial findings. 2. Mild chronic left maxillary sinusitis.   Electronically Signed   By:  Gaylyn Rong M.D.   On: 05/31/2015 18:29   I have personally reviewed and evaluated these images and lab results as part of my medical decision-making.   EKG Interpretation   Date/Time:  Wednesday May 31 2015 17:17:22 EDT Ventricular Rate:  67 PR Interval:  170 QRS Duration: 92 QT Interval:  414 QTC Calculation: 437 R Axis:   -46 Text Interpretation:  Normal sinus rhythm Incomplete right bundle branch  block Left anterior fascicular block Abnormal ECG No significant change  since last tracing Confirmed by Gwendolyn Grant  MD, Rosealee Recinos ([id number]) on 05/31/2015  5:17:24 PM      MDM   Final diagnoses:  Transient cerebral ischemia, unspecified transient cerebral ischemia type    74 year old male here with an episode of aphasia, dysarthria, word finding. Lasted 15 minutes and has now resolved. His vitals show some hypertension here, but otherwise they're stable. He is neurologically intact.  Concern for TIA. We'll scan his head and plan on admission. Scanning labs normal. Admitted to [facility] cone for TIA workup. I spoke with Dr. Cyril Mourning of Neurology.   Elwin Mocha, MD 05/31/15 [phone]

## 2015-06-01 ENCOUNTER — Inpatient Hospital Stay (HOSPITAL_BASED_OUTPATIENT_CLINIC_OR_DEPARTMENT_OTHER): Payer: Medicare Other

## 2015-06-01 ENCOUNTER — Observation Stay (HOSPITAL_COMMUNITY): Payer: Medicare Other

## 2015-06-01 ENCOUNTER — Inpatient Hospital Stay (HOSPITAL_COMMUNITY): Payer: Medicare Other

## 2015-06-01 DIAGNOSIS — G459 Transient cerebral ischemic attack, unspecified: Secondary | ICD-10-CM | POA: Diagnosis not present

## 2015-06-01 DIAGNOSIS — R4701 Aphasia: Secondary | ICD-10-CM

## 2015-06-01 LAB — LIPID PANEL
CHOL/HDL RATIO: 2.9 ratio
Cholesterol: 170 mg/dL (ref 0–200)
HDL: 58 mg/dL (ref >40)
LDL CALC: 103 mg/dL — AB (ref 0–99)
TRIGLYCERIDES: 46 mg/dL (ref <150)
VLDL: 9 mg/dL (ref 0–40)

## 2015-06-01 LAB — URINALYSIS, ROUTINE W REFLEX MICROSCOPIC
Bilirubin Urine: NEGATIVE
GLUCOSE, UA: NEGATIVE mg/dL
HGB URINE DIPSTICK: NEGATIVE
Ketones, ur: NEGATIVE mg/dL
Leukocytes, UA: NEGATIVE
Nitrite: NEGATIVE
Protein, ur: NEGATIVE mg/dL
SPECIFIC GRAVITY, URINE: 1.017 (ref 1.005–1.030)
Urobilinogen, UA: 1 mg/dL (ref 0.0–1.0)
pH: 6 (ref 5.0–8.0)

## 2015-06-01 LAB — URINE MICROSCOPIC-ADD ON

## 2015-06-01 LAB — COMPREHENSIVE METABOLIC PANEL
ALT: 17 U/L (ref 17–63)
AST: 24 U/L (ref 15–41)
Albumin: 3.3 g/dL — ABNORMAL LOW (ref 3.5–5.0)
Alkaline Phosphatase: 56 U/L (ref 38–126)
Anion gap: 6 (ref 5–15)
BUN: 9 mg/dL (ref 6–20)
CHLORIDE: 99 mmol/L — AB (ref 101–111)
CO2: 26 mmol/L (ref 22–32)
CREATININE: 1.12 mg/dL (ref 0.61–1.24)
Calcium: 8.7 mg/dL — ABNORMAL LOW (ref 8.9–10.3)
GFR calc non Af Amer: >60 mL/min (ref >60)
Glucose, Bld: 97 mg/dL (ref 65–99)
POTASSIUM: 4.2 mmol/L (ref 3.5–5.1)
SODIUM: 131 mmol/L — AB (ref 135–145)
Total Bilirubin: 0.7 mg/dL (ref 0.3–1.2)
Total Protein: 6 g/dL — ABNORMAL LOW (ref 6.5–8.1)

## 2015-06-01 LAB — BILIRUBIN, DIRECT: Bilirubin, Direct: 0.1 mg/dL (ref 0.1–0.5)

## 2015-06-01 LAB — TSH: TSH: 2.265 u[IU]/mL (ref 0.350–4.500)

## 2015-06-01 LAB — LACTATE DEHYDROGENASE: LDH: 179 U/L (ref 98–192)

## 2015-06-01 LAB — URIC ACID: Uric Acid, Serum: 6.2 mg/dL (ref 4.4–7.6)

## 2015-06-01 LAB — BRAIN NATRIURETIC PEPTIDE: B Natriuretic Peptide: 45.6 pg/mL (ref 0.0–100.0)

## 2015-06-01 LAB — PHOSPHORUS: PHOSPHORUS: 3.7 mg/dL (ref 2.5–4.6)

## 2015-06-01 MED ORDER — ATORVASTATIN CALCIUM 40 MG PO TABS: 40.0000 mg | ORAL_TABLET | Freq: Every day | ORAL | Status: DC

## 2015-06-01 MED ORDER — PAR-4 STUDY - PLACEBO /  BMS986141 BOTTLE C OR BLISTER PACK
1.0000 | Freq: Every day | Status: DC
  Administered 2015-06-01 – 2015-06-02 (×2): 1 via ORAL
  Filled 2015-06-01 (×3): qty 1

## 2015-06-01 MED ORDER — ASPIRIN 325 MG PO TABS: 325.0000 mg | ORAL_TABLET | Freq: Every day | ORAL | Status: DC

## 2015-06-01 MED ORDER — PAR-4 STUDY - ASPIRIN
162.0000 mg | Freq: Every day | Status: DC
  Administered 2015-06-01 – 2015-06-02 (×2): 162 mg via ORAL
  Filled 2015-06-01 (×3): qty 2

## 2015-06-01 MED ORDER — LORAZEPAM 2 MG/ML IJ SOLN
1.0000 mg | Freq: Once | INTRAMUSCULAR | Status: AC
  Administered 2015-06-01: 1 mg via INTRAVENOUS
  Filled 2015-06-01: qty 1

## 2015-06-01 MED ORDER — PAR-4 STUDY - PLACEBO /  BMS986141 BOTTLE D OR BLISTER PACK
1.0000 | Freq: Every day | Status: DC
  Administered 2015-06-01 – 2015-06-02 (×2): 1 via ORAL
  Filled 2015-06-01 (×3): qty 1

## 2015-06-01 MED ORDER — PAR-4 STUDY - PLACEBO /  BMS986141 BOTTLE A OR BLISTER PACK
1.0000 | Freq: Every day | Status: DC
  Administered 2015-06-01 – 2015-06-02 (×2): 1 via ORAL
  Filled 2015-06-01 (×3): qty 1

## 2015-06-01 MED ORDER — LORAZEPAM 2 MG/ML IJ SOLN: 1.0000 mg | Freq: Once | INTRAMUSCULAR | Status: DC

## 2015-06-01 MED ORDER — PAR-4 STUDY - PLACEBO /  BMS986141 BOTTLE B OR BLISTER PACK
1.0000 | Freq: Every day | Status: DC
  Administered 2015-06-01 – 2015-06-02 (×2): 1 via ORAL
  Filled 2015-06-01 (×3): qty 1

## 2015-06-01 NOTE — Discharge Summary (Signed)
Speech continues to be clear, no major issues overnight. SpeechPhysician Discharge Summary  Jeffrey Cobb ZOX:[id number] DOB: 1940-09-17 DOA: 05/31/2015  PCP:  Jeffrey Cobb  Admit date: 05/31/2015 Discharge date: 06/02/2015  Time spent: 35 minutes  Recommendations for Outpatient Follow-up:  1. FU with  Neurology PRN for clinical trial  2. FU with PCP for the following:  1. LDL 103 in hospital (post TIA goal < 70). Started Lipitor 40 mg QD. Please repeat lipid panel in 2 weeks.  2. Hypokalemia (130 on admission, improved to 131). Etiology unknown, encouraged PO intake. Repeat BMP in 2 weeks.    Discharge Diagnoses:  Principal Problem:   Aphasia Active Problems:   HYPERLIPIDEMIA TYPE IIB / III   Carotid disease, bilateral (HCC)   COPD (chronic obstructive pulmonary disease) (HCC)   RLS (restless legs syndrome)   OSA on CPAP   TIA (transient ischemic attack)   Acute encephalopathy   Discharge Condition: Stable  Diet recommendation: None   Filed Weights   05/31/15 1704  Weight: 90.719 kg (200 lb)    History of present illness:  Jeffrey Cobb is a 74 yo male who presented to the ED on 05/31/15 following an episode of altered mental status and aphasia. Per his wife, the patient became confused atound 4:00 PM on 10/5 with associated slurred speech and difficulty speaking. Symptoms lasted for 45 minutes with complete spontaneous resolution. He did not experience any numbness, weakness, vision changes, or HA. CT in ED was negative for acute intracranial abnormalities. Patient was admitted for further workup   Hospital Course:  Principal Problem: TIA: Patient presented with aphasia and slurred speech x45 minutes with complete spontaneous resolution. CT and MRI negative for acute intracranial abnormalities. MRA revealed mild atheromatous irregularity within MCA/PCA branches. 2D Echo showed EF of 55-60% with no wall motion abnormalities and mild LVH. Carotid doppler showed mild b/l  stenosis with severely diminished L vertebral artery flow. LDL 103 (see below), HgbA1c 5.7. Neurology consulted, initiated 325 mg Aspirin QD and Lipitor 40 mg QD. PT/OT/SLP consulted, no FU recommended. Patient is enrolled in neurology TIA clinical trial and will be provided Aspirin and study medication on discharge. Continue Lipitor on discharge and FU with PCP for management.   Active Problems:  Mixed Hyperlipidemia: Not requiring management at home (LDL in 10/19/2010 was 90). LDL in hospital was 103 (post TIA goal <70). Initiated Lipitor 40 mg QD, continue on discharge.  Bilateral carotid artery disease: Chronic, per patient- supposed to be on Aspirin therapy at home but patient did not comply. Carotid doppler revealed mild b/l stenosis (1-39%) with severely diminished L vertebral artery flow. Initiated Aspirin therapy, continue on discharge.   COPD: Chronic, managed at home with Albuterol PRN, continued. No complications during hospitalization. Continue home medication on discharge.  Restless Leg Syndrome: Stable, managed at home with Mirapex, continued. No complications during hospitalization. Continue home medication on discharge.  Obstructive Sleep Apnea: Stable, CPAP available for at-home management. Refuses CPAP use at hospital. No complications during hospitalization. Continue home medication on discharge.  BPH: Stable, managed at home with Cardura, continued. No complications during hospitalization. Continue home medication on discharge.   GERD: Stable, managed at home with Protonix, continued. No complications during hospitalization. Continue home medication on discharge.  Hypokalemia: 130 on admission, improved to 131 with oral intake. Etiology unknown. Encouraged PO intake and FU as outpatient.   Procedures:  Echocardiogram  Consultations:  Neurology   Discharge Exam: Filed Vitals:   06/02/15 0929  BP:  130/85  Pulse: 93  Temp: 98 F (36.7 C)  Resp: 20     General:  Well-nourished man, sitting in bed eating breakfast with no acute distress  Cardiovascular: RRR, no m/r/g. No peripheral edema   Respiratory: CTA b/l, no wheeze or crackles  Abdomen: Soft, non-tender, non-distended. + BS  Neuro: AxOx3, no focal neurological deficits  Psych: Appropriate mood and speech  Discharge Instructions   Discharge Instructions    Ambulatory referral to Neurology    Complete by:  As directed   Please schedule post stroke follow up in 2 months. Pt enrolled in PARFAIT.     Diet - low sodium heart healthy    Complete by:  As directed      Diet - low sodium heart healthy    Complete by:  As directed      Increase activity slowly    Complete by:  As directed      Increase activity slowly    Complete by:  As directed           Current Discharge Medication List    START taking these medications   Details  atorvastatin (LIPITOR) 40 MG tablet Take 1 tablet (40 mg total) by mouth daily at 6 PM. Qty: 30 tablet, Refills: 0      CONTINUE these medications which have NOT CHANGED   Details  doxazosin (CARDURA) 2 MG tablet Take 2 mg by mouth daily.     fluticasone (FLONASE) 50 MCG/ACT nasal spray Place 50 sprays into both nostrils as needed. Refills: 3    pantoprazole (PROTONIX) 40 MG tablet Take 1 tablet (40 mg total) by mouth 2 (two) times daily. Qty: 180 tablet, Refills: 1    pramipexole (MIRAPEX) 0.5 MG tablet 1 tab in the a.m and 1 tab in the p.m Qty: 180 tablet, Refills: 3    PROAIR HFA 108 (90 BASE) MCG/ACT inhaler Inhale 2 puffs into the lungs every 6 (six) hours as needed.    ranitidine (ZANTAC) 150 MG tablet Take 150 mg by mouth at bedtime.    zolpidem (AMBIEN) 10 MG tablet Take 10 mg by mouth at bedtime as needed for sleep.  Refills: 0       Allergies  Allergen Reactions  . Gabapentin     Dizziness   . Sulfonamide Derivatives Rash  . Trazodone And Nefazodone Rash and Other (See Comments)    Hallucinations    Follow-up Information     Follow up with SETHI,PRAMOD, Cobb In 2 months.   Specialties:  Neurology, Radiology   Why:  Stroke Clinic, Office will call you with appointment date & time (may be earlier than 2 months as he is enrolled in [place] study)   Contact information:   [address] [place] [address] [phone]        The results of significant diagnostics from this hospitalization (including imaging, microbiology, ancillary and laboratory) are listed below for reference.    Significant Diagnostic Studies: Ct Head Wo Contrast  05/31/2015   CLINICAL DATA:  Sudden onset of memory difficulties and dizziness. Aphasia. Dysarthria.  EXAM: CT HEAD WITHOUT CONTRAST  TECHNIQUE: Contiguous axial images were obtained from the base of the skull through the vertex without intravenous contrast.  COMPARISON:  04/27/2010 cervical spine MRI  FINDINGS: The brainstem, cerebellum, cerebral peduncles, thalamus, basal ganglia, basilar cisterns, and ventricular system appear within normal limits. No intracranial hemorrhage, mass lesion, or acute CVA.  Mild mucosal thickening in left maxillary sinus. There is  atherosclerotic calcification of the cavernous carotid arteries bilaterally.  IMPRESSION: 1. No acute intracranial findings. 2. Mild chronic left maxillary sinusitis.   Electronically Signed   By: Gaylyn Rong M.D.   On: 05/31/2015 18:29   Mr Brain Wo Contrast  06/01/2015   CLINICAL DATA:  TIA.  Speech abnormality.  EXAM: MRI HEAD WITHOUT CONTRAST  TECHNIQUE: Multiplanar, multiecho pulse sequences of the brain and surrounding structures were obtained without intravenous contrast.  COMPARISON:  MRI brain earlier today  FINDINGS: Image quality degraded by motion. Image quality on the prior study was of better quality.  Negative for acute infarct.  Mild chronic microvascular ischemic change in the white matter.  Negative for hemorrhage or mass lesion.  Ventricle size remains normal. No edema or shift of the midline  structures.  Paranasal sinuses are clear.  IMPRESSION: Image quality degraded by significant motion. No acute infarct identified.   Electronically Signed   By: [name] M.D.   On: 06/01/2015 18:56   Mr Brain Wo Contrast  06/01/2015   CLINICAL DATA:  Initial evaluation for acute episode of word-finding difficulty with slurred speech. Now resolved.  EXAM: MRI HEAD WITHOUT CONTRAST  MRA HEAD WITHOUT CONTRAST  TECHNIQUE: Multiplanar, multiecho pulse sequences of the brain and surrounding structures were obtained without intravenous contrast. Angiographic images of the head were obtained using MRA technique without contrast.  COMPARISON:  Prior CT from 05/31/2015.  FINDINGS: MRI HEAD FINDINGS  No abnormal foci of restricted diffusion to suggest acute intracranial infarct. Gray-white matter differentiation maintained. Normal intravascular flow voids are preserved. No acute or chronic intracranial hemorrhage.  Mild age-related cerebral atrophy present. There is minimal T2/FLAIR hyperintensity within the periventricular and deep white matter both cerebral hemispheres noted, most likely related to chronic small vessel ischemic disease.  No mass lesion or midline shift. No mass effect. No hydrocephalus. No extra-axial fluid collection.  Craniocervical junction widely patent. Mild degenerative spondylolysis noted within the visualized upper cervical spine. Pituitary gland within normal limits. No acute abnormality about the orbits. Sequela prior bilateral lens extraction noted. Mild mucosal thickening within the paranasal sinuses. No mastoid effusion. Inner ear structures normal.  Bone marrow signal intensity within normal limits. Scalp soft tissues unremarkable.  MRA HEAD FINDINGS  ANTERIOR CIRCULATION:  Visualized distal cervical segments of the internal carotid arteries are patent with antegrade flow. The petrous segments are widely patent. Mild dolichoectasia of the cavernous and supraclinoid segments bilaterally.  A1 segments, anterior communicating artery, and anterior cerebral arteries well opacified. M1 segments patent without stenosis or occlusion. MCA bifurcations normal. Distal MCA branches symmetric and well opacified bilaterally. Mild distal branch irregularity.  POSTERIOR CIRCULATION:  Vertebral arteries are patent to the vertebrobasilar junction. The right vertebral artery is dominant. Posterior inferior cerebral arteries grossly patent, although in not well evaluated on this exam. Basilar artery widely patent. Small right anterior inferior cerebral artery noted. Superior cerebellar arteries patent bilaterally. There is fetal origin of the right PCA with a widely patent right posterior communicating artery. Left P1 and P2 segments well opacified. Mild distal branch or a  No annulus or vascular malformation.  IMPRESSION: MRI HEAD IMPRESSION:  1. No acute intracranial infarct or other process identified. 2. Mild age-related cerebral atrophy with chronic small vessel ischemic disease.  MRA HEAD IMPRESSION:  1. No high-grade or correctable stenosis identified within the intracranial circulation. 2. Mild distal branch atheromatous irregularity within the MCA and PCA branches bilaterally.   Electronically Signed   By: Janell Quiet.D.  On: 06/01/2015 06:24   Mr Maxine Glenn Head/brain Wo Cm  06/01/2015   CLINICAL DATA:  Initial evaluation for acute episode of word-finding difficulty with slurred speech. Now resolved.  EXAM: MRI HEAD WITHOUT CONTRAST  MRA HEAD WITHOUT CONTRAST  TECHNIQUE: Multiplanar, multiecho pulse sequences of the brain and surrounding structures were obtained without intravenous contrast. Angiographic images of the head were obtained using MRA technique without contrast.  COMPARISON:  Prior CT from 05/31/2015.  FINDINGS: MRI HEAD FINDINGS  No abnormal foci of restricted diffusion to suggest acute intracranial infarct. Gray-white matter differentiation maintained. Normal intravascular flow voids are  preserved. No acute or chronic intracranial hemorrhage.  Mild age-related cerebral atrophy present. There is minimal T2/FLAIR hyperintensity within the periventricular and deep white matter both cerebral hemispheres noted, most likely related to chronic small vessel ischemic disease.  No mass lesion or midline shift. No mass effect. No hydrocephalus. No extra-axial fluid collection.  Craniocervical junction widely patent. Mild degenerative spondylolysis noted within the visualized upper cervical spine. Pituitary gland within normal limits. No acute abnormality about the orbits. Sequela prior bilateral lens extraction noted. Mild mucosal thickening within the paranasal sinuses. No mastoid effusion. Inner ear structures normal.  Bone marrow signal intensity within normal limits. Scalp soft tissues unremarkable.  MRA HEAD FINDINGS  ANTERIOR CIRCULATION:  Visualized distal cervical segments of the internal carotid arteries are patent with antegrade flow. The petrous segments are widely patent. Mild dolichoectasia of the cavernous and supraclinoid segments bilaterally. A1 segments, anterior communicating artery, and anterior cerebral arteries well opacified. M1 segments patent without stenosis or occlusion. MCA bifurcations normal. Distal MCA branches symmetric and well opacified bilaterally. Mild distal branch irregularity.  POSTERIOR CIRCULATION:  Vertebral arteries are patent to the vertebrobasilar junction. The right vertebral artery is dominant. Posterior inferior cerebral arteries grossly patent, although in not well evaluated on this exam. Basilar artery widely patent. Small right anterior inferior cerebral artery noted. Superior cerebellar arteries patent bilaterally. There is fetal origin of the right PCA with a widely patent right posterior communicating artery. Left P1 and P2 segments well opacified. Mild distal branch or a  No annulus or vascular malformation.  IMPRESSION: MRI HEAD IMPRESSION:  1. No acute  intracranial infarct or other process identified. 2. Mild age-related cerebral atrophy with chronic small vessel ischemic disease.  MRA HEAD IMPRESSION:  1. No high-grade or correctable stenosis identified within the intracranial circulation. 2. Mild distal branch atheromatous irregularity within the MCA and PCA branches bilaterally.   Electronically Signed   By: Rise Mu M.D.   On: 06/01/2015 06:24    Microbiology: No results found for this or any previous visit (from the past 240 hour(s)).   Labs: Basic Metabolic Panel:  Recent Labs Lab 05/31/15 1730 06/01/15 2038  NA 130* 131*  K 3.7 4.2  CL 98* 99*  CO2 25 26  GLUCOSE 94 97  BUN 12 9  CREATININE 1.18 1.12  CALCIUM 8.9 8.7*  PHOS  --  3.7   Liver Function Tests:  Recent Labs Lab 06/01/15 2038  AST 24  ALT 17  ALKPHOS 56  BILITOT 0.7  PROT 6.0*  ALBUMIN 3.3*   No results for input(s): LIPASE, AMYLASE in the last 168 hours. No results for input(s): AMMONIA in the last 168 hours. CBC:  Recent Labs Lab 05/31/15 1730  WBC 6.7  HGB 14.6  HCT 41.5  MCV 89.4  PLT 138*   Cardiac Enzymes:  Recent Labs Lab 05/31/15 1730  TROPONINI <0.03   BNP:  BNP (last 3 results)  Recent Labs  06/01/15 0710  BNP 45.6    ProBNP (last 3 results)  Recent Labs  09/02/14 1204  PROBNP 25.0    CBG: No results for input(s): GLUCAP in the last 168 hours.  Signed:  Connye Burkitt  [facility] 06/02/2015, 11:58 AM   Attending Cobb note  Patient was seen, examined,treatment plan was discussed with the /PA-S.  I have personally reviewed the clinical findings, lab, imaging studies and management of this patient in detail. I agree with the documentation, as recorded by the PA-S.  Speech continues to be clear, non focal exam. Enrolled in PARFAIT trial-per Dr Keane Scrape be discharged with ASA plus study drug, along with statin.Stable for discharge, rest as above.   [facility]

## 2015-06-01 NOTE — Progress Notes (Signed)
Patient refused CPAP.  Stated he doesn't wear his at home and does not want to wear while here.

## 2015-06-01 NOTE — Progress Notes (Signed)
[facility] PROGRESS NOTE  DONNELL WION WJX:[id number] DOB: October 26, 1940 DOA: 05/31/2015 PCP:  Duane Lope, MD  HPI Jeffrey Cobb is a 74 yo male who presented to the ED on 05/31/15 following an episode of altered mental status and aphasia. Per his wife, the patient became confused atound 4:00 PM on 10/5 with associated slurred speech and difficulty speaking. Symptoms lasted for 45 minutes with complete spontaneous resolution. He did not experience any numbness, weakness, vision changes, or HA. CT in ED was negative for acute intracranial abnormalities. Patient was admitted for further workup   PMH includes: Hyperlipidemia, GERD, Carotid artery stenosis, COPD, Sleep apnea, RLS, and BPH.  Subjective: Today the patient is doing well. Complete resolution of presenting symptoms with no additional complaints.   Assessment/Plan: Principal Problem: TIA: Patient presented with aphasia and slurred speech x45 minutes with complete spontaneous resolution. CT and MRI negative for acute intracranial abnormalities. MRA revealed mild atheromatous irregularity within MCA/PCA branches. 2D Echo showed EF of 55-60% with no wall motion abnormalities and mild LVH. Carotid doppler showed mild b/l stenosis with severely diminished L vertebral artery flow. LDL 103 (see below), HgbA1c pending. Neurology consulted, initiated 325 mg Aspirin QD and Lipitor 40 mg QD. PT consulted, no FU recommended. OT/SLP recommendations pending.   Active Problems:  Mixed Hyperlipidemia: Not requiring management at home (LDL in 10/19/2010 was 90). LDL today is 103 (post TIA goal <70). Initiated Lipitor 40 mg QD, continue.   Bilateral carotid artery disease: Chronic, per patient- supposed to be on Aspirin therapy at home but patient did not comply. Carotid doppler today revealed mild b/l stenosis (1-39%) with severely diminished L vertebral artery flow. Initiated Aspirin therapy, continue.   COPD: Chronic, managed at home with  Albuterol PRN. Continue at-home medication and monitor.    Restless Leg Syndrome: Stable, managed at home with Mirapex. Continue at-home medication and monitor.   Obstructive Sleep Apnea: Stable, CPAP available for at-home management. Refuses CPAP use here, continue to monitor.   BPH: Stable, managed at home with Cardura. Continue at-home medication and monitor.   GERD: Stable, managed at home with Protonix. Continue at-home medication and monitor.  Code Status: Full  Family Communication: Wife at bedside Disposition Plan: Home when workup complete  DVT Prophylaxis: Heparin   Consultants:  Neurology   Procedures:  Echocardiogram   Antibiotics:  None     Objective: Filed Vitals:   06/01/15 1011  BP: 142/66  Pulse: 58  Temp: 98.4 F (36.9 C)  Resp: 17   No intake or output data in the 24 hours ending 06/01/15 1129 Filed Weights   05/31/15 1704  Weight: 90.719 kg (200 lb)    Exam:   General:  Well-nourished man, laying in bed with no acute distress  Cardiovascular: RRR, no m/r/g. No peripheral edema   Respiratory: CTA b/l, no wheeze or crackles  Abdomen: Soft, non-tender, non-distended. + BS  Neuro: AxOx3, no focal neurological deficits  Psych: Appropriate mood and speech  Data Reviewed: Basic Metabolic Panel:  Recent Labs Lab 05/31/15 1730  NA 130*  K 3.7  CL 98*  CO2 25  GLUCOSE 94  BUN 12  CREATININE 1.18  CALCIUM 8.9   Liver Function Tests: No results for input(s): AST, ALT, ALKPHOS, BILITOT, PROT, ALBUMIN in the last 168 hours. No results for input(s): LIPASE, AMYLASE in the last 168 hours. No results for input(s): AMMONIA in the last 168 hours. CBC:  Recent Labs Lab 05/31/15 1730  WBC 6.7  HGB 14.6  HCT 41.5  MCV 89.4  PLT 138*   Cardiac Enzymes:  Recent Labs Lab 05/31/15 1730  TROPONINI <0.03   BNP (last 3 results)  Recent Labs  06/01/15 0710  BNP 45.6    ProBNP (last 3 results)  Recent Labs  09/02/14 1204   PROBNP 25.0    CBG: No results for input(s): GLUCAP in the last 168 hours.  No results found for this or any previous visit (from the past 240 hour(s)).   Studies: Ct Head Wo Contrast  05/31/2015   CLINICAL DATA:  Sudden onset of memory difficulties and dizziness. Aphasia. Dysarthria.  EXAM: CT HEAD WITHOUT CONTRAST  TECHNIQUE: Contiguous axial images were obtained from the base of the skull through the vertex without intravenous contrast.  COMPARISON:  04/27/2010 cervical spine MRI  FINDINGS: The brainstem, cerebellum, cerebral peduncles, thalamus, basal ganglia, basilar cisterns, and ventricular system appear within normal limits. No intracranial hemorrhage, mass lesion, or acute CVA.  Mild mucosal thickening in left maxillary sinus. There is atherosclerotic calcification of the cavernous carotid arteries bilaterally.  IMPRESSION: 1. No acute intracranial findings. 2. Mild chronic left maxillary sinusitis.   Electronically Signed   By: Gaylyn Rong M.D.   On: 05/31/2015 18:29   Mr Brain Wo Contrast  06/01/2015   CLINICAL DATA:  Initial evaluation for acute episode of word-finding difficulty with slurred speech. Now resolved.  EXAM: MRI HEAD WITHOUT CONTRAST  MRA HEAD WITHOUT CONTRAST  TECHNIQUE: Multiplanar, multiecho pulse sequences of the brain and surrounding structures were obtained without intravenous contrast. Angiographic images of the head were obtained using MRA technique without contrast.  COMPARISON:  Prior CT from 05/31/2015.  FINDINGS: MRI HEAD FINDINGS  No abnormal foci of restricted diffusion to suggest acute intracranial infarct. Gray-white matter differentiation maintained. Normal intravascular flow voids are preserved. No acute or chronic intracranial hemorrhage.  Mild age-related cerebral atrophy present. There is minimal T2/FLAIR hyperintensity within the periventricular and deep white matter both cerebral hemispheres noted, most likely related to chronic small vessel  ischemic disease.  No mass lesion or midline shift. No mass effect. No hydrocephalus. No extra-axial fluid collection.  Craniocervical junction widely patent. Mild degenerative spondylolysis noted within the visualized upper cervical spine. Pituitary gland within normal limits. No acute abnormality about the orbits. Sequela prior bilateral lens extraction noted. Mild mucosal thickening within the paranasal sinuses. No mastoid effusion. Inner ear structures normal.  Bone marrow signal intensity within normal limits. Scalp soft tissues unremarkable.  MRA HEAD FINDINGS  ANTERIOR CIRCULATION:  Visualized distal cervical segments of the internal carotid arteries are patent with antegrade flow. The petrous segments are widely patent. Mild dolichoectasia of the cavernous and supraclinoid segments bilaterally. A1 segments, anterior communicating artery, and anterior cerebral arteries well opacified. M1 segments patent without stenosis or occlusion. MCA bifurcations normal. Distal MCA branches symmetric and well opacified bilaterally. Mild distal branch irregularity.  POSTERIOR CIRCULATION:  Vertebral arteries are patent to the vertebrobasilar junction. The right vertebral artery is dominant. Posterior inferior cerebral arteries grossly patent, although in not well evaluated on this exam. Basilar artery widely patent. Small right anterior inferior cerebral artery noted. Superior cerebellar arteries patent bilaterally. There is fetal origin of the right PCA with a widely patent right posterior communicating artery. Left P1 and P2 segments well opacified. Mild distal branch or a  No annulus or vascular malformation.  IMPRESSION: MRI HEAD IMPRESSION:  1. No acute intracranial infarct or other process identified. 2. Mild age-related cerebral atrophy with chronic small vessel ischemic  disease.  MRA HEAD IMPRESSION:  1. No high-grade or correctable stenosis identified within the intracranial circulation. 2. Mild distal branch  atheromatous irregularity within the MCA and PCA branches bilaterally.   Electronically Signed   By: Rise Mu M.D.   On: 06/01/2015 06:24   Mr Maxine Glenn Head/brain Wo Cm  06/01/2015   CLINICAL DATA:  Initial evaluation for acute episode of word-finding difficulty with slurred speech. Now resolved.  EXAM: MRI HEAD WITHOUT CONTRAST  MRA HEAD WITHOUT CONTRAST  TECHNIQUE: Multiplanar, multiecho pulse sequences of the brain and surrounding structures were obtained without intravenous contrast. Angiographic images of the head were obtained using MRA technique without contrast.  COMPARISON:  Prior CT from 05/31/2015.  FINDINGS: MRI HEAD FINDINGS  No abnormal foci of restricted diffusion to suggest acute intracranial infarct. Gray-white matter differentiation maintained. Normal intravascular flow voids are preserved. No acute or chronic intracranial hemorrhage.  Mild age-related cerebral atrophy present. There is minimal T2/FLAIR hyperintensity within the periventricular and deep white matter both cerebral hemispheres noted, most likely related to chronic small vessel ischemic disease.  No mass lesion or midline shift. No mass effect. No hydrocephalus. No extra-axial fluid collection.  Craniocervical junction widely patent. Mild degenerative spondylolysis noted within the visualized upper cervical spine. Pituitary gland within normal limits. No acute abnormality about the orbits. Sequela prior bilateral lens extraction noted. Mild mucosal thickening within the paranasal sinuses. No mastoid effusion. Inner ear structures normal.  Bone marrow signal intensity within normal limits. Scalp soft tissues unremarkable.  MRA HEAD FINDINGS  ANTERIOR CIRCULATION:  Visualized distal cervical segments of the internal carotid arteries are patent with antegrade flow. The petrous segments are widely patent. Mild dolichoectasia of the cavernous and supraclinoid segments bilaterally. A1 segments, anterior communicating artery, and  anterior cerebral arteries well opacified. M1 segments patent without stenosis or occlusion. MCA bifurcations normal. Distal MCA branches symmetric and well opacified bilaterally. Mild distal branch irregularity.  POSTERIOR CIRCULATION:  Vertebral arteries are patent to the vertebrobasilar junction. The right vertebral artery is dominant. Posterior inferior cerebral arteries grossly patent, although in not well evaluated on this exam. Basilar artery widely patent. Small right anterior inferior cerebral artery noted. Superior cerebellar arteries patent bilaterally. There is fetal origin of the right PCA with a widely patent right posterior communicating artery. Left P1 and P2 segments well opacified. Mild distal branch or a  No annulus or vascular malformation.  IMPRESSION: MRI HEAD IMPRESSION:  1. No acute intracranial infarct or other process identified. 2. Mild age-related cerebral atrophy with chronic small vessel ischemic disease.  MRA HEAD IMPRESSION:  1. No high-grade or correctable stenosis identified within the intracranial circulation. 2. Mild distal branch atheromatous irregularity within the MCA and PCA branches bilaterally.   Electronically Signed   By: Rise Mu M.D.   On: 06/01/2015 06:24    Scheduled Meds: . aspirin  300 mg Rectal Daily   Or  . aspirin  325 mg Oral Daily  . atorvastatin  40 mg Oral q1800  . doxazosin  2 mg Oral Daily  . heparin  5,000 Units Subcutaneous 3 times per day  . pantoprazole  40 mg Oral BID  . pramipexole  0.5 mg Oral BID    Principal Problem:   Aphasia Active Problems:   HYPERLIPIDEMIA TYPE IIB / III   Carotid disease, bilateral (HCC)   COPD (chronic obstructive pulmonary disease) (HCC)   RLS (restless legs syndrome)   OSA on CPAP   TIA (transient ischemic attack)   Acute  encephalopathy       Georgene Kopper, Student-PA  [facility] If 7PM-7AM, please contact night-coverage at [email or web address], password [facility] 06/01/2015, 11:29  AM  LOS: 1 day

## 2015-06-01 NOTE — Progress Notes (Signed)
Pt refuses CPAP tonight. He says he has developed a bad cough over the last couple months and when this occurs at night he is pulling mask on/off and it is more aggravation to him. He states his Neurologist is aware he can not tolerate CPAP at this time. RT will pass on to RN.

## 2015-06-01 NOTE — Progress Notes (Signed)
  Echocardiogram 2D Echocardiogram has been performed.  Jeffrey Cobb 06/01/2015, 10:13 AM

## 2015-06-01 NOTE — Progress Notes (Signed)
VASCULAR LAB PRELIMINARY  PRELIMINARY  PRELIMINARY  PRELIMINARY  Carotid duplex  completed.    Preliminary report:  Bilateral: 1-39% ICA stenosis. Right:  Vertebral artery flow is antegrade.  Left:  Vertebral artery flow is retrograde and severely diminished.       Keidra Withers, RVT 06/01/2015, 10:03 AM

## 2015-06-01 NOTE — Care Management Note (Signed)
Case Management Note  Patient Details  Name: Jeffrey Cobb MRN: [id number] Date of Birth: 08-Oct-1940  Subjective/Objective:                    Action/Plan: Patient admitted with aphasia. MRI results negative. Awaiting PT/OT/ST evals for discharge disposition. CM will continue to follow for discharge needs.   Expected Discharge Date:                  Expected Discharge Plan:  Home/Self Care  In-House Referral:     Discharge planning Services     Post Acute Care Choice:    Choice offered to:     DME Arranged:    DME Agency:     HH Arranged:    HH Agency:     Status of Service:  In process, will continue to follow  Medicare Important Message Given:    Date Medicare IM Given:    Medicare IM give by:    Date Additional Medicare IM Given:    Additional Medicare Important Message give by:     If discussed at Long Length of Stay Meetings, dates discussed:    Additional Comments:  Kermit Balo, RN 06/01/2015, 9:43 AM

## 2015-06-01 NOTE — Progress Notes (Signed)
STROKE TEAM PROGRESS NOTE   HISTORY Jeffrey Cobb is an 74 y.o. male with a history of hyperlipidemia, right carotid stenosis, arthritis and sleep apnea, presenting following an episode of word finding difficulty as well as slurred speech. He was last known well at 4:30 PM today 05/30/2014. Deficits lasted about 45 minutes then resolved. He has no previous history of stroke nor TIA. He was seen initially at [facility] and subsequently transferred to [facility] for further management. He has not been on antiplatelets therapy. CT scan of his head showed no acute intracranial abnormality. NIH stroke score at the time of this evaluation was 0. Patient was not administered TPA secondary to Deficits resolved rapidly. He was admitted for further evaluation and treatment.   SUBJECTIVE (INTERVAL HISTORY) His wife is at the bedside.  Overall he feels his condition is rapidly improving.    OBJECTIVE Temp:  [97.6 F (36.4 C)-98.6 F (37 C)] 98.4 F (36.9 C) (10/06 1011) Pulse Rate:  [57-77] 58 (10/06 1011) Cardiac Rhythm:  [-] Normal sinus rhythm (10/06 0700) Resp:  [13-20] 17 (10/06 1011) BP: (107-158)/(66-93) 142/66 mmHg (10/06 1011) SpO2:  [92 %-100 %] 97 % (10/06 1011) Weight:  [90.719 kg (200 lb)] 90.719 kg (200 lb) (10/05 1704)  CBC:  Recent Labs Lab 05/31/15 1730  WBC 6.7  HGB 14.6  HCT 41.5  MCV 89.4  PLT 138*    Basic Metabolic Panel:  Recent Labs Lab 05/31/15 1730  NA 130*  K 3.7  CL 98*  CO2 25  GLUCOSE 94  BUN 12  CREATININE 1.18  CALCIUM 8.9    Lipid Panel:    Component Value Date/Time   CHOL 170 06/01/2015 0710   TRIG 46 06/01/2015 0710   HDL 58 06/01/2015 0710   CHOLHDL 2.9 06/01/2015 0710   VLDL 9 06/01/2015 0710   LDLCALC 103* 06/01/2015 0710   HgbA1c: No results found for: HGBA1C Urine Drug Screen: No results found for: LABOPIA, COCAINSCRNUR, LABBENZ, AMPHETMU, THCU, LABBARB    IMAGING  Ct Head Wo Contrast 05/31/2015   1. No acute intracranial findings. 2.  Mild chronic left maxillary sinusitis.     MRI HEAD  06/01/2015   1. No acute intracranial infarct or other process identified. 2. Mild age-related cerebral atrophy with chronic small vessel ischemic disease.    MRA HEAD  06/01/2015   1. No high-grade or correctable stenosis identified within the intracranial circulation. 2. Mild distal branch atheromatous irregularity within the MCA and PCA branches bilaterally.     Carotid Doppler  Bilateral: 1-39% ICA stenosis. Right: Vertebral artery flow is antegrade. Left: Vertebral artery flow is retrograde and severely diminished.    2D Echocardiogram   - Left ventricle: The cavity size was normal. Wall thickness wasincreased in a pattern of mild LVH. Systolic function was normal.The estimated ejection fraction was in the range of 55% to 60%.Wall motion was normal; there were no regional wall motionabnormalities. Doppler parameters are consistent with abnormalleft ventricular relaxation (grade 1 diastolic dysfunction). -  Aortic valve: There was trivial regurgitation.   PHYSICAL EXAM  . Afebrile. Head is nontraumatic. Neck is supple without bruit.    Cardiac exam no murmur or gallop. Lungs are clear to auscultation. Distal pulses are well felt. Neurological Exam ;  Awake  Alert oriented x 3. Normal speech and language.eye movements full without nystagmus.fundi were not visualized. Vision acuity and fields appear normal. Hearing is normal. Palatal movements are normal. Face symmetric. Tongue midline. Normal strength, tone, reflexes and coordination.  Normal sensation. Gait deferred  NIHSS 0. ABCD2 score 4  .ASSESSMENT/PLAN [name] is a 74 y.o. male with history of hyperlipidemia, right carotid stenosis, arthritis and sleep apnea  presenting with transient word finding difficulty and dysarthria. He did not receive IV t-PA due to deficits rapidly resolving.   L brain TIA  MRI  No acute stroke  MRA  Chronic small vessel  disease  Carotid Doppler  No ICA stenosis, L VA flow retrograde and severely diminished  2D Echo  No source of embolus   LDL 103  HgbA1c pending  Heparin 5000 units sq tid for VTE prophylaxis  Diet Heart Room service appropriate?: Yes; Fluid consistency:: Thin  no antithrombotic prior to admission, now on aspirin 325 mg orally every day  Patient counseled to be compliant with his antithrombotic medications  PARFAIT trial assessment desired. [place] [facility] research staff to follow with patient for possible enrollment.  Ongoing aggressive stroke risk factor management  Therapy recommendations:  No PT  Disposition:  Return home  Hypertension  Stable  Hyperlipidemia  Home meds:  No statin  LDL 103, goal < 70  Added Lipitor 40 mg daily  Continue statin at discharge  Other Stroke Risk Factors  Advanced age  Former Cigarette smoker, quit smoking 37 years ago   ETOH use  Obstructive sleep apnea, refuses to use CPAP at home or in hospital  Carotid artery stenosis followed every 6 months a cardiology office  Other Active Problems  COPD  Restless leg syndrome  BPH  Hospital day # 1  Jeffrey Cobb [facility] See Amion for Pager information 06/01/2015 5:09 PM   I have personally examined this patient, reviewed notes, independently viewed imaging studies, participated in medical decision making and plan of care. I have made any additions or clarifications directly to the above note. Agree with note above. He presented with transient episode of expressive aphasia and dysarthria likely due to left hemispheric TIA. He remains at risk for neurological worsening, recurrent stroke, TIA needs ongoing stroke evaluation and aggressive risk factor modification. Start aspirin. I had a long discussion with the patient and his wife regarding his risk and discuss possible participation in the PARFAITstroke/TIA prevention trial. He has expressed interest and he  was given written information to review and if interested we will see if he qualifies . Discussed with Dr. Jerral Ralph medical hospitalist Delia Heady, MD Medical Director [facility] Pager: [phone] 06/01/2015 5:12 PM   To contact Stroke Continuity provider, please refer to [email or web address]. After hours, contact General Neurology

## 2015-06-01 NOTE — Evaluation (Signed)
Physical Therapy Evaluation Patient Details Name: Jeffrey Cobb MRN: [id number] DOB: [birth year] Today's Date: 06/01/2015   History of Present Illness  Jeffrey Cobb is a 74 y.o. male with PMH of lipidemia, GERD, carotid artery stenosis, COPD, OSA on CPAP, arthritis, RLS, BPH, who presents with altered mental status and aphasia.  Clinical Impression  Patient presents with no current PT needs due to at functional baseline.  Has some numbness in feet and right hip bursitis and realizes he must make lifestyle changes to impact his health.  Discussed options at [facility] in [place] for community fitness that won't exacerbate his current medical issues.  Feel patient appropriately motivated for increasing his activity level.      Follow Up Recommendations No PT follow up    Equipment Recommendations  None recommended by PT    Recommendations for Other Services       Precautions / Restrictions Precautions Precautions: None Restrictions Weight Bearing Restrictions: No      Mobility  Bed Mobility               General bed mobility comments: up in chair  Transfers Overall transfer level: Independent Equipment used: None                Ambulation/Gait Ambulation/Gait assistance: Independent Ambulation Distance (Feet): 200 Feet Assistive device: None Gait Pattern/deviations: Step-through pattern;Wide base of support;Trendelenburg     General Gait Details: right trendelenberg, left hip ER reports due to numbness in his feet  Stairs Stairs: Yes Stairs assistance: Modified independent (Device/Increase time) Stair Management: One rail Right;Forwards;Alternating pattern Number of Stairs: 10 General stair comments: reports using rail due to general LE weakness  Wheelchair Mobility    Modified Rankin (Stroke Patients Only) Modified Rankin (Stroke Patients Only) Pre-Morbid Rankin Score: No symptoms Modified Rankin: Slight disability     Balance                                 Standardized Balance Assessment Standardized Balance Assessment : Dynamic Gait Index   Dynamic Gait Index Level Surface: Mild Impairment Change in Gait Speed: Normal Gait with Horizontal Head Turns: Normal Gait with Vertical Head Turns: Mild Impairment Gait and Pivot Turn: Normal Step Over Obstacle: Normal Step Around Obstacles: Normal Steps: Mild Impairment Total Score: 21       Pertinent Vitals/Pain Pain Assessment: Faces Faces Pain Scale: Hurts little more Pain Location: right hip Pain Descriptors / Indicators: Aching Pain Intervention(s): Monitored during session    Home Living Family/patient expects to be discharged to:: Private residence Living Arrangements: Spouse/significant other Available Help at Discharge: Family Type of Home: House Home Access: Stairs to enter   Secretary/administrator of Steps: 1 Home Layout: One level Home Equipment: None Additional Comments: reports decreased activity level at home, but still able to do everything    Prior Function Level of Independence: Independent               Hand Dominance   Dominant Hand: Left    Extremity/Trunk Assessment   Upper Extremity Assessment: Overall WFL for tasks assessed           Lower Extremity Assessment: Overall WFL for tasks assessed (WFL strength and ROM, but reports chronic numbness in feet not related to neuropathy.)         Communication   Communication: No difficulties  Cognition Arousal/Alertness: Awake/alert Behavior During Therapy: WFL for tasks assessed/performed Overall Cognitive Status:  Within Functional Limits for tasks assessed                      General Comments      Exercises        Assessment/Plan    PT Assessment Patent does not need any further PT services  PT Diagnosis Abnormality of gait   PT Problem List    PT Treatment Interventions     PT Goals (Current goals can be found in the Care Plan section)  Acute Rehab PT Goals PT Goal Formulation: All assessment and education complete, DC therapy    Frequency     Barriers to discharge        Co-evaluation               End of Session   Activity Tolerance: Patient tolerated treatment well Patient left: in chair;with call bell/phone within reach;with family/visitor present      Functional Assessment Tool Used: Clinical Judgement Functional Limitation: Mobility: Walking and moving around Mobility: Walking and Moving Around Current Status (T0160): At least 1 percent but less than 20 percent impaired, limited or restricted Mobility: Walking and Moving Around Goal Status [phone]): At least 1 percent but less than 20 percent impaired, limited or restricted Mobility: Walking and Moving Around Discharge Status [phone]): At least 1 percent but less than 20 percent impaired, limited or restricted    Time: 0902-0930 PT Time Calculation (min) (ACUTE ONLY): 28 min   Charges:   PT Evaluation $Initial PT Evaluation Tier I: 1 Procedure PT Treatments $Gait Training: 8-22 mins   PT G Codes:   PT G-Codes **NOT FOR INPATIENT CLASS** Functional Assessment Tool Used: Clinical Judgement Functional Limitation: Mobility: Walking and moving around Mobility: Walking and Moving Around Current Status (U2025): At least 1 percent but less than 20 percent impaired, limited or restricted Mobility: Walking and Moving Around Goal Status [phone]): At least 1 percent but less than 20 percent impaired, limited or restricted Mobility: Walking and Moving Around Discharge Status [phone]): At least 1 percent but less than 20 percent impaired, limited or restricted    [facility] 06/01/2015, 10:55 AM  Sheran Lawless, PT [phone] 06/01/2015

## 2015-06-02 DIAGNOSIS — G459 Transient cerebral ischemic attack, unspecified: Secondary | ICD-10-CM

## 2015-06-02 DIAGNOSIS — E782 Mixed hyperlipidemia: Secondary | ICD-10-CM

## 2015-06-02 DIAGNOSIS — G4733 Obstructive sleep apnea (adult) (pediatric): Secondary | ICD-10-CM

## 2015-06-02 DIAGNOSIS — G2581 Restless legs syndrome: Secondary | ICD-10-CM

## 2015-06-02 DIAGNOSIS — I779 Disorder of arteries and arterioles, unspecified: Secondary | ICD-10-CM | POA: Diagnosis not present

## 2015-06-02 DIAGNOSIS — J410 Simple chronic bronchitis: Secondary | ICD-10-CM

## 2015-06-02 DIAGNOSIS — R4701 Aphasia: Secondary | ICD-10-CM | POA: Diagnosis not present

## 2015-06-02 LAB — URINALYSIS, ROUTINE W REFLEX MICROSCOPIC
Bilirubin Urine: NEGATIVE
Glucose, UA: NEGATIVE mg/dL
Hgb urine dipstick: NEGATIVE
Ketones, ur: NEGATIVE mg/dL
Leukocytes, UA: NEGATIVE
NITRITE: NEGATIVE
Protein, ur: NEGATIVE mg/dL
SPECIFIC GRAVITY, URINE: 1.007 (ref 1.005–1.030)
UROBILINOGEN UA: 0.2 mg/dL (ref 0.0–1.0)
pH: 6.5 (ref 5.0–8.0)

## 2015-06-02 LAB — HEMOGLOBIN A1C
Hgb A1c MFr Bld: 5.7 % — ABNORMAL HIGH (ref 4.8–5.6)
MEAN PLASMA GLUCOSE: 117 mg/dL

## 2015-06-02 NOTE — Progress Notes (Signed)
STROKE TEAM PROGRESS NOTE   SUBJECTIVE (INTERVAL HISTORY) His wife is at the bedside.  He was enrolled in Parfait study yesterday.   OBJECTIVE Temp:  [97.7 F (36.5 C)-98.4 F (36.9 C)] 98.2 F (36.8 C) (10/07 0530) Pulse Rate:  [58-77] 68 (10/07 0530) Cardiac Rhythm:  [-] Normal sinus rhythm;Bundle branch block (10/07 0700) Resp:  [16-18] 18 (10/07 0530) BP: (114-150)/(66-96) 126/80 mmHg (10/07 0530) SpO2:  [94 %-97 %] 96 % (10/07 0530)  CBC:   Recent Labs Lab 05/31/15 1730  WBC 6.7  HGB 14.6  HCT 41.5  MCV 89.4  PLT 138*    Basic Metabolic Panel:   Recent Labs Lab 05/31/15 1730 06/01/15 2038  NA 130* 131*  K 3.7 4.2  CL 98* 99*  CO2 25 26  GLUCOSE 94 97  BUN 12 9  CREATININE 1.18 1.12  CALCIUM 8.9 8.7*  PHOS  --  3.7    Lipid Panel:     Component Value Date/Time   CHOL 170 06/01/2015 0710   TRIG 46 06/01/2015 0710   HDL 58 06/01/2015 0710   CHOLHDL 2.9 06/01/2015 0710   VLDL 9 06/01/2015 0710   LDLCALC 103* 06/01/2015 0710   HgbA1c:  Lab Results  Component Value Date   HGBA1C 5.7* 06/01/2015   Urine Drug Screen: No results found for: LABOPIA, COCAINSCRNUR, LABBENZ, AMPHETMU, THCU, LABBARB    IMAGING  Ct Head Wo Contrast 05/31/2015   1. No acute intracranial findings. 2. Mild chronic left maxillary sinusitis.     MRI HEAD  06/01/2015   1. No acute intracranial infarct or other process identified. 2. Mild age-related cerebral atrophy with chronic small vessel ischemic disease.    MRA HEAD  06/01/2015   1. No high-grade or correctable stenosis identified within the intracranial circulation. 2. Mild distal branch atheromatous irregularity within the MCA and PCA branches bilaterally.     Carotid Doppler  Bilateral: 1-39% ICA stenosis. Right: Vertebral artery flow is antegrade. Left: Vertebral artery flow is retrograde and severely diminished.    2D Echocardiogram   - Left ventricle: The cavity size was normal. Wall thickness wasincreased  in a pattern of mild LVH. Systolic function was normal.The estimated ejection fraction was in the range of 55% to 60%.Wall motion was normal; there were no regional wall motionabnormalities. Doppler parameters are consistent with abnormalleft ventricular relaxation (grade 1 diastolic dysfunction). -  Aortic valve: There was trivial regurgitation.   PHYSICAL EXAM  . Afebrile. Head is nontraumatic. Neck is supple without bruit.    Cardiac exam no murmur or gallop. Lungs are clear to auscultation. Distal pulses are well felt. Neurological Exam ;  Awake  Alert oriented x 3. Normal speech and language.eye movements full without nystagmus.fundi were not visualized. Vision acuity and fields appear normal. Hearing is normal. Palatal movements are normal. Face symmetric. Tongue midline. Normal strength, tone, reflexes and coordination. Normal sensation. Gait deferred  NIHSS 0. ABCD2 score 4  .ASSESSMENT/PLAN Jeffrey Cobb is a 74 y.o. male with history of hyperlipidemia, right carotid stenosis, arthritis and sleep apnea  presenting with transient word finding difficulty and dysarthria. He did not receive IV t-PA due to deficits rapidly resolving.   L brain TIA  Enrolled in PARFAIT study  MRI  No acute stroke  MRA  Chronic small vessel disease  Carotid Doppler  No ICA stenosis, L VA flow retrograde and severely diminished  2D Echo  No source of embolus   LDL 103  HgbA1c 5.7  Heparin  discontinued due to research study  Diet Heart Room service appropriate?: Yes; Fluid consistency:: Thin Diet - low sodium heart healthy  no antithrombotic prior to admission, now on PARFAIT study drugs (no separate aspirin)  Patient counseled to be compliant with his antithrombotic medications  Ongoing aggressive stroke risk factor management  Therapy recommendations:  No PT  Disposition:  Return home  [facility] for discharge after 2p blood draw today Patient has a 10-15% risk of having another  stroke over the next year, the highest risk is within 2 weeks of the most recent stroke/TIA (risk of having a stroke following a stroke or TIA is the same). Ongoing risk factor control by Primary Care Physician RN to get drug from pharmacy for pt to take home with him Follow-up Stroke Clinic at [facility] with Dr. Delia Heady in 2 months or as per PARFAIT study, order placed.  Hypertension  Stable  Hyperlipidemia  Home meds:  No statin  LDL 103, goal < 70  Added Lipitor 40 mg daily  Continue statin at discharge  Other Stroke Risk Factors  Advanced age  Former Cigarette smoker, quit smoking 37 years ago   ETOH use  Obstructive sleep apnea, refuses to use CPAP at home or in hospital  Carotid artery stenosis followed every 6 months a cardiology office  Other Active Problems  COPD  Restless leg syndrome  BPH  Hospital day # 2  Rhoderick Moody [facility] See Amion for Pager information 06/02/2015 9:07 AM  I have personally examined this patient, reviewed notes, independently viewed imaging studies, participated in medical decision making and plan of care. I have made any additions or clarifications directly to the above note. Agree with note above. He will be discharged today afternoon after the last blood draw collection for the research study. He was advised to follow-up in the office as per study protocol. He'll stay on aspirin and study medication  Delia Heady, MD Medical Director [facility] Pager: [phone] 06/02/2015 2:25 PM   To contact Stroke Continuity provider, please refer to [email or web address]. After hours, contact General Neurology

## 2015-06-02 NOTE — Progress Notes (Signed)
OT Cancellation Note  Patient Details Name: CARRELL RAHMANI MRN: [id number] DOB: January 24, 1941   Cancelled Treatment:    Reason Eval/Treat Not Completed: OT screened, no needs identified, will sign off. Pt up and walking in hallway with his wife without difficulty. Reports no issues with being able to care for himself and wife agrees.  Evette Georges [phone] 06/02/2015, 11:16 AM

## 2015-06-02 NOTE — Progress Notes (Signed)
Pt for discharge home today. Discharge orders received. IV and telemetry dcd with dressing clean dry and intact. Discharge instructions and 1 prescription given with verbalized understanding.Follow-up Stroke Clinic at [facility] with Dr. Delia Heady as per PARFAIT study. RN  retrieved drug from pharmacy for pt to take home with him. Family at bedside to assist with discharge. Staff brought patient to lobby via wheelchair at this time. Transported to home by family member.

## 2015-06-02 NOTE — Evaluation (Signed)
Speech Language Pathology Evaluation Patient Details Name: Jeffrey Cobb MRN: [id number] DOB: [birth year] Today's Date: 06/02/2015 Time: 4098-1191 SLP Time Calculation (min) (ACUTE ONLY): 28 min  Problem List:  Patient Active Problem List   Diagnosis Date Noted  . TIA (transient ischemic attack) 05/31/2015  . Acute encephalopathy 05/31/2015  . Aphasia 05/31/2015  . Interstitial lung disease (HCC) 04/03/2015  . Solitary pulmonary nodule 04/03/2015  . Diastolic dysfunction 10/03/2014  . DOE (dyspnea on exertion) 09/07/2014  . RLS (restless legs syndrome) 04/15/2014  . OSA on CPAP 04/15/2014  . Hypersomnia with sleep apnea 04/15/2014  . Restless legs syndrome (RLS) 01/03/2014  . Disturbance of skin sensation 01/03/2014  . Somnolence, daytime 01/03/2014  . Acute bronchitis 08/31/2013  . Chronic cough 06/26/2011  . PERSISTENT DISORDER INITIATING/MAINTAINING SLEEP 11/08/2010  . HYPERLIPIDEMIA TYPE IIB / III 10/24/2009  . Carotid disease, bilateral (HCC) 10/03/2009  . COPD (chronic obstructive pulmonary disease) (HCC) 12/21/2007   Past Medical History:  Past Medical History  Diagnosis Date  . Occlusion and stenosis of carotid artery without mention of cerebral infarction     a. 05/2013 Carotid U/S: 1-39% bilat stenosis.  . Obstructive chronic bronchitis with exacerbation (HCC)   . Other emphysema (HCC)   . Mixed hyperlipidemia   . Persistent disorder of initiating or maintaining sleep   . Acute sinusitis, unspecified   . GERD (gastroesophageal reflux disease)   . Arthritis   . Sleep apnea     a. on CPAP.   Past Surgical History:  Past Surgical History  Procedure Laterality Date  . Spine surgery  2000/2011  . Appendectomy  1956  . Right ankle reconstruction  1986  . Ruptured disk  2000  . Left knee replacement  2001  . Right knee replacement  2005  . Joint replacement Right     shoulder replacement  . Eye surgery Bilateral     cataract removal  . Torn retina    .  Total shoulder arthroplasty Left 06/30/2013    Procedure: TOTAL SHOULDER ARTHROPLASTY;  Surgeon: Loreta Ave, MD;  Location: [facility] OR;  Service: Orthopedics;  Laterality: Left;   HPI:  Jeffrey Cobb is a 74 y.o. male with PMH of lipidemia, GERD, carotid artery stenosis, COPD, OSA on CPAP, arthritis, RLS, BPH, who presents with altered mental status and aphasia.   Assessment / Plan / Recommendation Clinical Impression  Pt appears to be functioning at his cognitive-linguistic baseline. Mild memory deficits noted particularly with storage of new information, however pt and wife both agree that this is his baseline. SLP provided examples of compensatory strategies to use to facilitate recall of new information, as well as activities to complete in order to engage specific cognitive skills. No further SLP f/u indicated.    SLP Assessment  Patient does not need any further Speech Lanaguage Pathology Services    Follow Up Recommendations  None    Pertinent Vitals/Pain Pain Assessment: No/denies pain   SLP Goals  Patient/Family Stated Goal: none stated  SLP Evaluation Prior Functioning  Cognitive/Linguistic Baseline: Baseline deficits Baseline deficit details: pt reports difficulty wtih memory and math at baseline Type of Home: House  Lives With: Spouse   Cognition  Overall Cognitive Status: History of cognitive impairments - at baseline    Comprehension  Auditory Comprehension Overall Auditory Comprehension: Appears within functional limits for tasks assessed Visual Recognition/Discrimination Discrimination: Within Function Limits    Expression Expression Primary Mode of Expression: Verbal Verbal Expression Overall Verbal Expression: Appears within functional  limits for tasks assessed   Oral / Motor Oral Motor/Sensory Function Overall Oral Motor/Sensory Function: Appears within functional limits for tasks assessed Motor Speech Overall Motor Speech: Appears within functional  limits for tasks assessed   GO Functional Assessment Tool Used: skilled clinical judgment Functional Limitations: Memory Memory Current Status (Z6109): At least 20 percent but less than 40 percent impaired, limited or restricted Memory Goal Status (U0454): At least 20 percent but less than 40 percent impaired, limited or restricted Memory Discharge Status [phone]): At least 20 percent but less than 40 percent impaired, limited or restricted    Maxcine Ham, M.A. CCC-SLP [phone]  Maxcine Ham 06/02/2015, 3:13 PM

## 2015-06-05 ENCOUNTER — Other Ambulatory Visit: Payer: Self-pay | Admitting: Neurology

## 2015-06-05 DIAGNOSIS — G459 Transient cerebral ischemic attack, unspecified: Secondary | ICD-10-CM

## 2015-06-07 ENCOUNTER — Other Ambulatory Visit: Payer: Self-pay | Admitting: Neurology

## 2015-06-07 ENCOUNTER — Ambulatory Visit (INDEPENDENT_AMBULATORY_CARE_PROVIDER_SITE_OTHER)
Admission: RE | Admit: 2015-06-07 | Discharge: 2015-06-07 | Disposition: A | Discharge status: Home / Self Care | Payer: Medicare Other | Source: Ambulatory Visit | Attending: Pulmonary Disease | Admitting: Pulmonary Disease

## 2015-06-07 DIAGNOSIS — R911 Solitary pulmonary nodule: Secondary | ICD-10-CM

## 2015-06-07 DIAGNOSIS — J849 Interstitial pulmonary disease, unspecified: Secondary | ICD-10-CM

## 2015-06-07 MED ORDER — LORAZEPAM 1 MG PO TABS: 1.0000 mg | ORAL_TABLET | Freq: Three times a day (TID) | ORAL | Status: DC

## 2015-06-08 ENCOUNTER — Telehealth: Payer: Self-pay

## 2015-06-08 NOTE — Telephone Encounter (Signed)
Called and left a message to return

## 2015-06-09 ENCOUNTER — Telehealth: Payer: Self-pay

## 2015-06-09 NOTE — Telephone Encounter (Signed)
[facility] that discharging physician has advised subject to start taking Aspirin 325 mg QD. Per Dr. Marlis EdelsonSethi's advice site contacted the subject and asked if he is also taking Aspirin 325 mg other than the study medicine. Subject denied that. He confirmed that he is ONLY taking Aspirin 162 mg Qd, as advised by Dr. Pearlean BrownieSethi in the hospital. He is not taking any additional Aspirin.

## 2015-06-09 NOTE — Telephone Encounter (Signed)
I spoke to the patient in regards to the Parfait trial. I asked the patient to take the study medication as prescribed and not to take any other aspirin than the one provided with the study medication. Patient voiced understanding.

## 2015-06-15 ENCOUNTER — Other Ambulatory Visit: Payer: Self-pay

## 2015-06-15 NOTE — Patient Outreach (Signed)
Outreach made to confirm Emmi Transition Stroke enrollment; no answer. Will attempt two more times.

## 2015-06-27 ENCOUNTER — Telehealth: Payer: Self-pay | Admitting: Neurology

## 2015-06-27 NOTE — Telephone Encounter (Signed)
I spoke with Jeffrey Cobb.  I reminded him of his 28 day research appt.  I asked him to bring all of his study medication and aspirin with him to his visit.   I also asked him to bring his study diaries along with him for his visit.  He said he would.

## 2015-06-28 ENCOUNTER — Ambulatory Visit (INDEPENDENT_AMBULATORY_CARE_PROVIDER_SITE_OTHER): Payer: Self-pay | Admitting: Neurology

## 2015-06-28 ENCOUNTER — Encounter: Payer: Self-pay | Admitting: Neurology

## 2015-06-28 DIAGNOSIS — G459 Transient cerebral ischemic attack, unspecified: Secondary | ICD-10-CM

## 2015-06-28 NOTE — Progress Notes (Signed)
Pt came in today for 28 day PARFAIT end of study visit. He has been doing well, no recurrent stroke like symptoms. No side effect from IVD.   NIHSS = 0, mRS = 0, neuro exam normal.   He has OSA and followed up with Darrol Angelarolyn Martin and [facility] He has not been tolerating CPAP due to cough. I recommended nasal pillow for him to try. He will contact our sleep lab as well as [facility].  He was recommended to check BP at home and record to follow up with PCP to get better BP control.   He will continue ASA 162mg  and lipitor 40mg  daily for stroke prevention. IVD will be stopped. Pt has appointment with Dr. Pearlean BrownieSethi next month.   Jeffrey PlanJindong Shankar Silber, MD PhD Stroke Neurology 06/28/2015 11:09 PM

## 2015-06-29 ENCOUNTER — Encounter (HOSPITAL_COMMUNITY): Payer: Self-pay | Admitting: *Deleted

## 2015-06-29 ENCOUNTER — Ambulatory Visit (HOSPITAL_COMMUNITY)
Admission: RE | Admit: 2015-06-29 | Discharge: 2015-06-29 | Disposition: A | Discharge status: Home / Self Care | Payer: Medicare Other | Source: Ambulatory Visit | Attending: Neurology | Admitting: Neurology

## 2015-06-29 DIAGNOSIS — G459 Transient cerebral ischemic attack, unspecified: Secondary | ICD-10-CM | POA: Insufficient documentation

## 2015-06-30 ENCOUNTER — Encounter: Payer: Medicare Other | Admitting: Neurology

## 2015-07-25 ENCOUNTER — Telehealth: Payer: Self-pay | Admitting: Neurology

## 2015-07-25 ENCOUNTER — Encounter: Payer: Self-pay | Admitting: Pulmonary Disease

## 2015-07-25 ENCOUNTER — Ambulatory Visit (INDEPENDENT_AMBULATORY_CARE_PROVIDER_SITE_OTHER): Payer: Medicare Other | Admitting: Pulmonary Disease

## 2015-07-25 VITALS — BP 118/82 | HR 85 | Ht 69.0 in | Wt 198.8 lb

## 2015-07-25 DIAGNOSIS — R911 Solitary pulmonary nodule: Secondary | ICD-10-CM | POA: Diagnosis not present

## 2015-07-25 DIAGNOSIS — R053 Chronic cough: Secondary | ICD-10-CM

## 2015-07-25 DIAGNOSIS — J849 Interstitial pulmonary disease, unspecified: Secondary | ICD-10-CM

## 2015-07-25 DIAGNOSIS — R05 Cough: Secondary | ICD-10-CM

## 2015-07-25 DIAGNOSIS — J410 Simple chronic bronchitis: Secondary | ICD-10-CM

## 2015-07-25 NOTE — Progress Notes (Signed)
Subjective:    Patient ID: Jeffrey Cobb, male    DOB: 05-Mar-1941, 74 y.o.   MRN: [id number]  Synopsis: COPD and chronic cough, ?pulmonary fibrosis PFT"s 2009:  FEV1 3.49 (115%), ratio 65, DLCO 67%. No change with maintenance meds.  PFT's 08/2014:  FEV1 3.13 (114%), ratio 69, decreased airtrapping from 2009, DLCO 49% 2016 CT Chest showed ?early UIP and pulmonary nodules felt to be inflammatory Quit smoking in 1979 after 16 years of smoking 2 ppd Failed Gabapentin for cough in 2016 due to dizziness Failed Symbicort and Spiriva in that he felt like they really didn't help symptoms  HPI  Chief Complaint  Patient presents with  . Follow-up    Former [facility] pt. Pt c/o increased dry cough with SOB. Pt states he had an "attack" about 2pm this afternoon and used his albuterol HFA with no relief. Pt denied CP/tightness, f/n/v/d. Pt also reports recent stoke on 05/29/15.     Jeffrey Cobb says that he has not been doing well.  This morning around 2 AM he had a bad coughing attack and has been more short of breath since then. He has been coughing more in the last few days, but actually not to back.  He notes sick contacts recently.  His wife had "the flu".  He has not had fever or chills today.    He went to see Loralie Champagne at [facility] and he said that he had normal appearing vocal cords.  He was started on ranitidine in addition to the protonix.  He said that he is not sure if his cough improved with this or not, however he notes earlier in my interview that his cough had improved.  His history is not really consistent.  He was started on amitryptiline last week for sleep.     Past Medical History  Diagnosis Date  . Occlusion and stenosis of carotid artery without mention of cerebral infarction     a. 05/2013 Carotid U/S: 1-39% bilat stenosis.  . Obstructive chronic bronchitis with exacerbation (HCC)   . Other emphysema (HCC)   . Mixed hyperlipidemia   . Persistent disorder of  initiating or maintaining sleep   . Acute sinusitis, unspecified   . GERD (gastroesophageal reflux disease)   . Arthritis   . Sleep apnea     a. on CPAP.      Review of Systems  Constitutional: Negative for fever, chills and fatigue.  HENT: Positive for rhinorrhea. Negative for sinus pressure and sneezing.   Respiratory: Positive for cough. Negative for shortness of breath and wheezing.   Cardiovascular: Negative for chest pain, palpitations and leg swelling.       Objective:   Physical Exam  Filed Vitals:   07/25/15 1623  BP: 118/82  Pulse: 85  Height:  (1.753 m)  Weight: 198 lb 12.8 oz (90.175 kg)  SpO2: 96%   RA  Gen: well appearing HENT: OP clear, TM's clear, neck supple PULM: Crackles bases, normal percussion CV: RRR, no mgr, trace edema GI: BS+, soft, nontender Derm: no cyanosis or rash Psyche: normal mood and affect   CT results reviewed again, stable nodule, ? UIP Records from his visit with Dr. Delford Field with ENT reviewed     Assessment & Plan:  COPD (chronic obstructive pulmonary disease) (HCC) Stable interval Continue prn albuterol  Chronic cough He saw Dr. Delford Field at [facility] and he had some evidence of reflux on his vocal cord exam. He seems to have  been doing better since adding ranitidine in the Elavil by his primary care physician.  I think that the addition of elavil last week has actually helped his cough somewhat.  He stopped the gabapentin  Plan: Continue Elavil as prescribed Continue acid reflux treatment Continue follow-up with Dr. Delford FieldWright  Solitary pulmonary nodule We will repeat a CT chest (high-resolution) as recommended by radiology in 6 months. We will pay attention to whether or not there has been some progression of usual interstitial pneumonitis.  Interstitial lung disease (HCC) As noted before on my personal review of the images from his CT chest a did not think there is significant burden of disease to explain his  cough. 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  Plan: As part of a pulmonary nodule surveillance we will get a high-resolution CT chest in 6 months, if it does show evidence of usual interstitial pneumonitis than we will repeat lung function testing.     Current outpatient prescriptions:  .  amitriptyline (ELAVIL) 10 MG tablet, Take 10 mg by mouth at bedtime. Taking "three pills qHS", uncertain of dose, Disp: , Rfl:  .  atorvastatin (LIPITOR) 40 MG tablet, Take 1 tablet (40 mg total) by mouth daily at 6 PM., Disp: 30 tablet, Rfl: 0 .  doxazosin (CARDURA) 2 MG tablet, Take 2 mg by mouth daily. , Disp: , Rfl:  .  fluticasone (FLONASE) 50 MCG/ACT nasal spray, Place 50 sprays into both nostrils as needed., Disp: , Rfl: 3 .  pantoprazole (PROTONIX) 40 MG tablet, Take 1 tablet (40 mg total) by mouth 2 (two) times daily., Disp: 180 tablet, Rfl: 1 .  pramipexole (MIRAPEX) 0.5 MG tablet, 1 tab in the a.m and 1 tab in the p.m (Patient taking differently: Take 0.5 mg by mouth 2 (two) times daily. 1 tab in the a.m and 1 tab in the p.m), Disp: 180 tablet, Rfl: 3 .  PROAIR HFA 108 (90 BASE) MCG/ACT inhaler, Inhale 2 puffs into the lungs every 6 (six) hours as needed., Disp: , Rfl:  .  ranitidine (ZANTAC) 150 MG tablet, Take 150 mg by mouth at bedtime., Disp: , Rfl:  .  zolpidem (AMBIEN) 10 MG tablet, Take 10 mg by mouth at bedtime as needed for sleep. , Disp: , Rfl: 0

## 2015-07-25 NOTE — Telephone Encounter (Signed)
Jeffrey Cobb returned my phone call with regards to rescheduling his 8990 Day PARFAIT research visit.  His visit has been rescheduled to January 3rd, 2017 at 2:30pm.

## 2015-07-25 NOTE — Assessment & Plan Note (Signed)
As noted before on my personal review of the images from his CT chest a did not think there is significant burden of disease to explain his cough. 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  Plan: As part of a pulmonary nodule surveillance we will get a high-resolution CT chest in 6 months, if it does show evidence of usual interstitial pneumonitis than we will repeat lung function testing.

## 2015-07-25 NOTE — Assessment & Plan Note (Signed)
We will repeat a CT chest (high-resolution) as recommended by radiology in 6 months. We will pay attention to whether or not there has been some progression of usual interstitial pneumonitis.

## 2015-07-25 NOTE — Assessment & Plan Note (Signed)
Stable interval Continue prn albuterol

## 2015-07-25 NOTE — Telephone Encounter (Signed)
I left a message for Jeffrey Cobb to call me back concerning his 90 Day Research Visit.  I need to reschedule his visit to a later date (ideally [id number]).

## 2015-07-25 NOTE — Assessment & Plan Note (Signed)
He [facility] and he had some evidence of reflux on his vocal cord exam. He seems to have been doing better since adding ranitidine in the Elavil by his primary care physician.  I think that the addition of elavil last week has actually helped his cough somewhat.  He stopped the gabapentin  Plan: Continue Elavil as prescribed Continue acid reflux treatment Continue follow-up with Dr. Delford FieldWright

## 2015-07-25 NOTE — Patient Instructions (Signed)
Keep taking albuterol 3 times a day for the next 2-3 days Take tramadol tonight 3 go bed Keep taking the Elavil We will see you back in 4-6 months or sooner if needed

## 2015-07-31 ENCOUNTER — Ambulatory Visit (INDEPENDENT_AMBULATORY_CARE_PROVIDER_SITE_OTHER): Payer: Medicare Other | Admitting: Neurology

## 2015-07-31 ENCOUNTER — Encounter: Payer: Self-pay | Admitting: Neurology

## 2015-07-31 VITALS — BP 135/79 | HR 95 | Ht 69.0 in | Wt 199.2 lb

## 2015-07-31 DIAGNOSIS — G459 Transient cerebral ischemic attack, unspecified: Secondary | ICD-10-CM | POA: Diagnosis not present

## 2015-07-31 NOTE — Progress Notes (Signed)
[facility]  PATIENT: Jeffrey Cobb DOB: [birth year]   REASON FOR VISIT: Follow-up for restless leg syndrome , paresthesias of the feet, obstructive sleep apnea, chronic insomnia HISTORY FROM: Patient    HISTORY OF PRESENT ILLNESS:Jeffrey Cobb, 74 year old male returns for followup. He has a history of restless leg syndrome and sensory dysesthesias. He also has a history of obstructive sleep apnea with good compliance of CPAP in the past however due to his chronic cough in  October last year which did not  respond to antibiotics or prednisone. He has been unable to use his CPAP since that time . He was referred to ENT and now to a l[facility] at [facility]. On today's visit he also reports continued  numbness, tingling/burning and discomfort in his feet, this is been an ongoing problem but it has worsened. EMG nerve conduction after last visit was normal. He has been placed on a trial of Neurontin by his primary care however had side effects of dizziness on the medication.He has a history of spinal stenosis and lumbar spine decompression 2011 by Dr. Gerlene FeeKritzer. He has had 2 epidural injections since last seen with minimal benefit. He denies any falls however he does feel off balance at times. He has intermittent back pain since his surgery. His restless legs are under good control with his Mirapex. He returns for reevaluation   HISTORY: of sensory dysesthesias, restless legs and insomnia.  MRI of the spine (lumbar) that he had done in April of 2009 showed moderate to severe spinal stenosis. He had lumbar spine decompression and fusion by Dr. Gerlene FeeKritzer in 2011, did very well. He also has a history of right and left knee replacements and a shoulder replacement. He is currently taking Mirapex for his restless legs tolerating the medication without drowsiness or any type of compulsive behaviors. iron profile was normal.   Update 07/31/2015 : Patient is seen today for follow-up  after her recent hospital admission for TIA on 05/30/14. He presented following an episode of word finding difficulty as well as slurred speech. He was last known well at 4:30 PM on 05/30/2014. Deficits lasted about 45 minutes then resolved. He has no previous history of stroke nor TIA. [facility] and [facility] management. He had not been on antiplatelets therapy. CT scan of his head showed no acute intracranial abnormality. NIH stroke score on admission evaluation was 0. Patient was not administered TPA secondary to Deficits resolved rapidly. He was admitted for further evaluation and treatment. MRI scan of the brain showed no acute infarct only mild age-related changes of cerebral atrophy and chronic small vessel ischemia. MRA of the brain showed no large vessel stenosis. Mild distal atheromatous changes were noted in MCA and PCA branches. Carotid ultrasound showed no significant Stenosis. Transthoracic echo showed normal ejection fraction. NIH stroke scale was 0. ABCD 2 score was 4. Patient qualified for and signed consent and participate in the PARFAIT TIA study. LDL cholesterol was elevated at 103 and he was started on Lipitor. He states his done well since discharge and has not had any recurrent stroke or TIA symptoms. He was seen by Dr. Roda ShuttersXu on 06/28/15 for 30 days study visit and study medication was discontinued. He is currently on aspirin 81 mg which is tolerating well without bleeding or bruising. He is also not having any side effects on Lipitor 40 mg daily which is taking everyday. He has not had any new complaints.  REVIEW OF SYSTEMS: Full 14 system review of systems performed and notable only for those listed, all others are neg: Hearing loss, ringing in the ears, cough, restless leg, insomnia, apnea, cold intolerance, easy bruising, joint pain, back pain and all other systems negative   ALLERGIES: Allergies  Allergen Reactions  . Gabapentin      Dizziness   . Sulfonamide Derivatives Rash  . Trazodone And Nefazodone Rash and Other (See Comments)    Hallucinations     HOME MEDICATIONS: Outpatient Prescriptions Prior to Visit  Medication Sig Dispense Refill  . amitriptyline (ELAVIL) 10 MG tablet Take 10 mg by mouth at bedtime. Taking "three pills qHS", uncertain of dose    . atorvastatin (LIPITOR) 40 MG tablet Take 1 tablet (40 mg total) by mouth daily at 6 PM. 30 tablet 0  . doxazosin (CARDURA) 2 MG tablet Take 2 mg by mouth daily.     . fluticasone (FLONASE) 50 MCG/ACT nasal spray Place 50 sprays into both nostrils as needed.  3  . pantoprazole (PROTONIX) 40 MG tablet Take 1 tablet (40 mg total) by mouth 2 (two) times daily. 180 tablet 1  . pramipexole (MIRAPEX) 0.5 MG tablet 1 tab in the a.m and 1 tab in the p.m (Patient taking differently: Take 0.5 mg by mouth 2 (two) times daily. 1 tab in the a.m and 1 tab in the p.m) 180 tablet 3  . PROAIR HFA 108 (90 BASE) MCG/ACT inhaler Inhale 2 puffs into the lungs every 6 (six) hours as needed.    . ranitidine (ZANTAC) 150 MG tablet Take 150 mg by mouth at bedtime.    Marland Kitchen zolpidem (AMBIEN) 10 MG tablet Take 10 mg by mouth at bedtime as needed for sleep.   0   No facility-administered medications prior to visit.    PAST MEDICAL HISTORY: Past Medical History  Diagnosis Date  . Occlusion and stenosis of carotid artery without mention of cerebral infarction     a. 05/2013 Carotid U/S: 1-39% bilat stenosis.  . Obstructive chronic bronchitis with exacerbation (HCC)   . Other emphysema (HCC)   . Mixed hyperlipidemia   . Persistent disorder of initiating or maintaining sleep   . Acute sinusitis, unspecified   . GERD (gastroesophageal reflux disease)   . Arthritis   . Sleep apnea     a. on CPAP.  Marland Kitchen Stroke [facility])     PAST SURGICAL HISTORY: Past Surgical History  Procedure Laterality Date  . Spine surgery  2000/2011  . Appendectomy  1956  . Ruptured disk  2000  . Left knee replacement   2001  . Right knee replacement  2005  . Joint replacement Right     shoulder replacement  . Eye surgery Bilateral     cataract removal  . Torn retina    . 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 Right ankle reconstruction      FAMILY HISTORY: Family History  Problem Relation Age of Onset  . Emphysema Mother   . Heart disease Mother   . Alzheimer's disease Mother   . Heart disease Father   . Allergies      FH: FATHER,2 SISTER,1 BROTHER, SON  DAUGHTER  . Cancer Sister   . Stroke Paternal Grandfather     SOCIAL HISTORY: Social History   Social History  . Marital Status: Married    Spouse  Name: Jeffrey Cobb   . Number of Children: 2  . Years of Education: Assoc    Occupational History  . Retired    Social History Main Topics  . Smoking status: Former Smoker -- 3.00 packs/day for 17 years    Types: Cigarettes    Quit date: 08/26/1977  . Smokeless tobacco: Never Used  . Alcohol Use: Yes     Comment: rarely  . Drug Use: No  . Sexual Activity: Not on file   Other Topics Concern  . Not on file   Social History Narrative   Patient is married Jeffrey Cobb) and lives at home with his wife.   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    Patient is left handed   Patient drinks 1-2 cups of coffee in the morning and 3-4 cups of tea daily.                 PHYSICAL EXAM  Filed Vitals:   07/31/15 1516  BP: 135/79  Pulse: 95  Height: 5\' 9"  (1.753 m)  Weight: 199 lb 3.2 oz (90.357 kg)   Body mass index is 29.4 kg/(m^2). Generalized: Well developed, Mildly obese male in no acute distress  Head: normocephalic and atraumatic,.   Neck: Supple, no carotid bruits  Cardiac: Regular rate rhythm, no murmur  Musculoskeletal: Arthritic changes in hands  Neurological examination   Mentation: Alert  oriented to time, place, history taking. Follows all commands speech and language fluent.  Cranial nerve II-XII: Pupils were equal round reactive to light extraocular movements were full, visual field were full on confrontational test. Facial sensation and strength were normal. hearing was intact to finger rubbing bilaterally. Uvula tongue midline. head turning and shoulder shrug were normal and symmetric.Tongue protrusion into cheek strength was normal. Motor: normal bulk and tone, full strength in the BUE, BLE, fine finger movements normal, no pronator drift. No focal weakness Sensory: Mildly decreased light touch and pinprick to mid shin bilaterally, vibration normal. Proprioception normal  Coordination: finger-nose-finger, heel-to-shin bilaterally, no dysmetria Reflexes: Brachioradialis 2/2, biceps 2/2, triceps 2/2, patellar 0/0, Achilles 1/1, plantar responses were flexor bilaterally. Gait and Station: Rising up from seated position without assistance, normal stance, moderate stride, good arm swing, smooth turning, able to perform tiptoe, and heel walking without difficulty. Tandem gait is mildly unsteady. No assistive device   NIHSS 0. MRS 0  DIAGNOSTIC DATA (LABS, IMAGING, TESTING) - I reviewed patient records, labs, notes, testing and imaging myself where available.  Lab Results  Component Value Date   WBC 6.7 05/31/2015   HGB 14.6 05/31/2015   HCT 41.5 05/31/2015   MCV 89.4 05/31/2015   PLT 138* 05/31/2015      ASSESSMENT AND PLAN 74 y.o. year old male has a past medical history of restless leg syndrome Occlusion and stenosis of carotid artery without mention of cerebral infarction; Obstructive chronic bronchitis with exacerbation; Other emphysema; Mixed hyperlipidemia; Persistent disorder of initiating or maintaining sleep; Sleep apnea. and increased paresthesias in both feet bilaterally. Recent left hemispheric TIA on 05/31/15.T Patient paricipated in the PARFAIT trial    PLAN :  I had a long d/w patient and wife about his recent TIA, risk for recurrent stroke/TIAs, personally independently reviewed imaging studies and stroke evaluation results and answered questions.Continue aspirin 81 mg daily  for secondary stroke prevention and maintain strict control of hypertension with blood pressure goal below 130/90, diabetes with hemoglobin A1c goal below 6.5% and lipids  with LDL cholesterol goal below 100 mg/dL. I also advised the patient to eat a healthy diet with plenty of whole grains, cereals, fruits and vegetables, exercise regularly and maintain ideal body weight .Keep scheduled end of study visit follow-up for PARFAIt trial in January Followup in the future with me in  7 months or call earlier if necessary Delia Heady, MD [facility] [address] [place], [place] [address] [phone]

## 2015-07-31 NOTE — Patient Instructions (Signed)
I had a long d/w patient and wife about his recent TIA, risk for recurrent stroke/TIAs, personally independently reviewed imaging studies and stroke evaluation results and answered questions.Continue aspirin 81 mg daily  for secondary stroke prevention and maintain strict control of hypertension with blood pressure goal below 130/90, diabetes with hemoglobin A1c goal below 6.5% and lipids with LDL cholesterol goal below 100 mg/dL. I also advised the patient to eat a healthy diet with plenty of whole grains, cereals, fruits and vegetables, exercise regularly and maintain ideal body weight .Keep scheduled end of study visit follow-up for PARFAIt trial in January Followup in the future with me in  7 months or call earlier if necessary Stroke Prevention Some medical conditions and behaviors are associated with an increased chance of having a stroke. You may prevent a stroke by making healthy choices and managing medical conditions. HOW CAN I REDUCE MY RISK OF HAVING A STROKE?   Stay physically active. Get at least 30 minutes of activity on most or all days.  Do not smoke. It may also be helpful to avoid exposure to secondhand smoke.  Limit alcohol use. Moderate alcohol use is considered to be:  No more than 2 drinks per day for men.  No more than 1 drink per day for nonpregnant women.  Eat healthy foods. This involves:  Eating 5 or more servings of fruits and vegetables a day.  Making dietary changes that address high blood pressure (hypertension), high cholesterol, diabetes, or obesity.  Manage your cholesterol levels.  Making food choices that are high in fiber and low in saturated fat, trans fat, and cholesterol may control cholesterol levels.  Take any prescribed medicines to control cholesterol as directed by your health care provider.  Manage your diabetes.  Controlling your carbohydrate and sugar intake is recommended to manage diabetes.  Take any prescribed medicines to control  diabetes as directed by your health care provider.  Control your hypertension.  Making food choices that are low in salt (sodium), saturated fat, trans fat, and cholesterol is recommended to manage hypertension.  Ask your health care provider if you need treatment to lower your blood pressure. Take any prescribed medicines to control hypertension as directed by your health care provider.  If you are 64-90 years of age, have your blood pressure checked every 3-5 years. If you are 15 years of age or older, have your blood pressure checked every year.  Maintain a healthy weight.  Reducing calorie intake and making food choices that are low in sodium, saturated fat, trans fat, and cholesterol are recommended to manage weight.  Stop drug abuse.  Avoid taking birth control pills.  Talk to your health care provider about the risks of taking birth control pills if you are over 28 years old, smoke, get migraines, or have ever had a blood clot.  Get evaluated for sleep disorders (sleep apnea).  Talk to your health care provider about getting a sleep evaluation if you snore a lot or have excessive sleepiness.  Take medicines only as directed by your health care provider.  For some people, aspirin or blood thinners (anticoagulants) are helpful in reducing the risk of forming abnormal blood clots that can lead to stroke. If you have the irregular heart rhythm of atrial fibrillation, you should be on a blood thinner unless there is a good reason you cannot take them.  Understand all your medicine instructions.  Make sure that other conditions (such as anemia or atherosclerosis) are addressed. SEEK IMMEDIATE MEDICAL  CARE IF:   You have sudden weakness or numbness of the face, arm, or leg, especially on one side of the body.  Your face or eyelid droops to one side.  You have sudden confusion.  You have trouble speaking (aphasia) or understanding.  You have sudden trouble seeing in one or  both eyes.  You have sudden trouble walking.  You have dizziness.  You have a loss of balance or coordination.  You have a sudden, severe headache with no known cause.  You have new chest pain or an irregular heartbeat. Any of these symptoms may represent a serious problem that is an emergency. Do not wait to see if the symptoms will go away. Get medical help at once. Call your local emergency services (911 in [place] Do not drive yourself to the hospital.   This information is not intended to replace advice given to you by your health care provider. Make sure you discuss any questions you have with your health care provider.   Document Released: 09/19/2004 Document Revised: 09/02/2014 Document Reviewed: 02/12/2013 Elsevier Interactive Patient Education [employer]2016 [employer] Inc.

## 2015-08-02 ENCOUNTER — Other Ambulatory Visit: Payer: Self-pay | Admitting: Pulmonary Disease

## 2015-08-03 NOTE — Telephone Encounter (Signed)
Pt requesting refill on Tramadol 50mg .  Medication on pt's med list and has been filled by [facility] in the past, but is currently listed as a "historical medication".    [facility] ok to refill?  Thanks!

## 2015-08-07 ENCOUNTER — Telehealth: Payer: Self-pay | Admitting: Pulmonary Disease

## 2015-08-07 NOTE — Telephone Encounter (Signed)
Spoke with pt. He needs a refill on Tramadol. Advised him that we will have to get approval from [facility] to refill this medication. He agreed and verbalized understanding.  [facility] - please advise on refill. Thanks.

## 2015-08-08 MED ORDER — TRAMADOL HCL 50 MG PO TABS: 50.0000 mg | ORAL_TABLET | Freq: Four times a day (QID) | ORAL | Status: DC | PRN

## 2015-08-08 NOTE — Telephone Encounter (Signed)
error 

## 2015-08-08 NOTE — Telephone Encounter (Signed)
OK by me Dispense #[id number], refill 2

## 2015-08-08 NOTE — Telephone Encounter (Signed)
Spoke with pt. He is aware that we will refill his medication. Rx has been sent in. Nothing further was needed.

## 2015-08-08 NOTE — Telephone Encounter (Signed)
Awaiting on response from [facility]

## 2015-08-29 ENCOUNTER — Encounter (INDEPENDENT_AMBULATORY_CARE_PROVIDER_SITE_OTHER): Payer: Self-pay

## 2015-08-29 DIAGNOSIS — Z0289 Encounter for other administrative examinations: Secondary | ICD-10-CM

## 2015-09-21 ENCOUNTER — Encounter: Payer: Self-pay | Admitting: Internal Medicine

## 2015-10-06 ENCOUNTER — Ambulatory Visit (INDEPENDENT_AMBULATORY_CARE_PROVIDER_SITE_OTHER): Payer: Medicare Other | Admitting: Cardiology

## 2015-10-06 ENCOUNTER — Encounter: Payer: Self-pay | Admitting: Cardiology

## 2015-10-06 VITALS — BP 112/66 | HR 88 | Ht 69.0 in | Wt 201.0 lb

## 2015-10-06 DIAGNOSIS — E785 Hyperlipidemia, unspecified: Secondary | ICD-10-CM | POA: Diagnosis not present

## 2015-10-06 DIAGNOSIS — R0609 Other forms of dyspnea: Secondary | ICD-10-CM | POA: Diagnosis not present

## 2015-10-06 DIAGNOSIS — G459 Transient cerebral ischemic attack, unspecified: Secondary | ICD-10-CM | POA: Diagnosis not present

## 2015-10-06 DIAGNOSIS — R0600 Dyspnea, unspecified: Secondary | ICD-10-CM

## 2015-10-06 DIAGNOSIS — I5032 Chronic diastolic (congestive) heart failure: Secondary | ICD-10-CM | POA: Diagnosis not present

## 2015-10-06 LAB — HEPATIC FUNCTION PANEL
ALT: 17 U/L (ref 9–46)
AST: 22 U/L (ref 10–35)
Albumin: 3.7 g/dL (ref 3.6–5.1)
Alkaline Phosphatase: 70 U/L (ref 40–115)
Bilirubin, Direct: 0.2 mg/dL (ref <=0.2)
Indirect Bilirubin: 0.3 mg/dL (ref 0.2–1.2)
Total Bilirubin: 0.5 mg/dL (ref 0.2–1.2)
Total Protein: 6.8 g/dL (ref 6.1–8.1)

## 2015-10-06 LAB — BASIC METABOLIC PANEL
BUN: 14 mg/dL (ref 7–25)
CO2: 27 mmol/L (ref 20–31)
Calcium: 8.9 mg/dL (ref 8.6–10.3)
Chloride: 99 mmol/L (ref 98–110)
Creat: 1.13 mg/dL (ref 0.70–1.18)
Glucose, Bld: 67 mg/dL (ref 65–99)
Potassium: 4.2 mmol/L (ref 3.5–5.3)
Sodium: 134 mmol/L — ABNORMAL LOW (ref 135–146)

## 2015-10-06 NOTE — Progress Notes (Signed)
Patient ID: Jeffrey Cobb, male   DOB: [birth year], 75 y.o.   MRN: [id number] Patient ID: Jeffrey Cobb, male   DOB: 03-12-1941, 75 y.o.   MRN: [id number]       Patient Name: Jeffrey Cobb Date of Encounter: 10/06/2015  Primary Care Provider:   Duane Lope, MD Primary Cardiologist:  Eloy End, MD   Patient Profile  75 y/o male with a h/o COPD, HL, and recent persistent cough and DOE who presents for eval.  Problem List   Past Medical History  Diagnosis Date  . Occlusion and stenosis of carotid artery without mention of cerebral infarction     a. 05/2013 Carotid U/S: 1-39% bilat stenosis.  . Obstructive chronic bronchitis with exacerbation (HCC)   . Other emphysema (HCC)   . Mixed hyperlipidemia   . Persistent disorder of initiating or maintaining sleep   . Acute sinusitis, unspecified   . GERD (gastroesophageal reflux disease)   . Arthritis   . Sleep apnea     a. on CPAP.  [facility] Stroke [facility])    Past Surgical History  Procedure Laterality Date  . Spine surgery  2000/2011  . Appendectomy  1956  . Ruptured disk  2000  . Left knee replacement  2001  . Right knee replacement  2005  . Joint replacement Right     shoulder replacement  . Eye surgery Bilateral     cataract removal  . Torn retina    . Total shoulder arthroplasty Left 06/30/2013    Procedure: TOTAL SHOULDER ARTHROPLASTY;  Surgeon: Loreta Ave, MD;  Location: [facility] OR;  Service: Orthopedics;  Laterality: Left;  . Right ankle reconstruction      Allergies  Allergies  Allergen Reactions  . Gabapentin     Dizziness   . Sulfonamide Derivatives Rash  . Trazodone And Nefazodone Rash and Other (See Comments)    Hallucinations     HPI  75 y/o male with the above problem list.  He is followed here 2/2 non-obstructive bilat carotid dzs and is due for f/u U/S later this year.  He also has a h/o COPD for which he is on prn albuterol only.  He was dx with severe OSA last summer and was placed on CPAP using a  nasal pillow (couldn't tolerate mask 2/2 claustrophobia).  Unfortunately, he feels that the nasal pillow causes him nasal pressure and stuffiness, and thus he hasn't been using that either.  He says that beginning in ~ late October (though looking back further, notes from pulmonology show that he had similar Ss beginning in August), he noted increased dry, hacking cough without fever or chills.  He also noted mild DOE. He took some amoxicillin left over from prior dental visit and says that he took 1 tab bid for 10 days w/o much impact on either the cough or dyspnea.  He saw his PCP in November and was placed on doxycycline and also given a depmedrol inj.  Despite these interventions, his cough has continued and his dyspnea has progressed.  Now, he can only walk about 50 yds or so prior to having to stop and rest.  He has not had any chest pain and denies any h/o pnd, orthopnea, n, v, dizziness, syncope, or early satiety.  At one point in Nov or December, his wife noted mild lower ext swelling but this has since resolved without intervention.  He saw his PCP on 12/31 and due to ongoing Ss, he was set up to see  me today.  02/010/2017 - the patient is coming for follow-up after one year. He feels great however in October he had an episode of TIA when he presented with slurred speech that resolved in 45 minutes all of these MRIs were negative. His echocardiogram shows normal systolic grade 1 diastolic dysfunction and normal size of the left atrium. He's never been diagnosed with atrial fibrillation. He was started on a baby aspirin and atorvastatin 40 mg daily. He denies any joint pain or muscle pain. No chest pain or dyspnea on exertion or palpitations. He's being followed by neurology.   Home Medications  Prior to Admission medications   Medication Sig Start Date End Date Taking? Authorizing Provider  diclofenac sodium (VOLTAREN) 1 % GEL Apply topically as needed.   Yes Historical Provider, MD  doxazosin  (CARDURA) 2 MG tablet Take 2 mg by mouth daily.  08/22/11  Yes Historical Provider, MD  fluticasone (FLONASE) 50 MCG/ACT nasal spray Place 50 sprays into both nostrils as needed. 05/31/14  Yes Historical Provider, MD  LEVITRA 20 MG tablet Take 20 mg by mouth daily as needed for erectile dysfunction.  06/11/11  Yes Historical Provider, MD  pantoprazole (PROTONIX) 40 MG tablet Take 1 tablet (40 mg total) by mouth 2 (two) times daily. 05/23/14  Yes Barbaraann Share, MD  pramipexole (MIRAPEX) 0.5 MG tablet Use a half tab in AM and 1.5 tabs in PM po. 04/15/14  Yes Melvyn Novas, MD  PROAIR HFA 108 (90 BASE) MCG/ACT inhaler Inhale 2 puffs into the lungs every 6 (six) hours as needed. 01/28/14  Yes Historical Provider, MD  zolpidem (AMBIEN) 10 MG tablet Take 10 mg by mouth at bedtime. 08/26/14  Yes Historical Provider, MD  Ascorbic Acid (VITAMIN C PO) Take by mouth.    Historical Provider, MD  aspirin EC 81 MG tablet Take 1 tablet (81 mg total) by mouth daily. Patient not taking: Reported on 09/02/2014 04/14/13   Gaylord Shih, MD   Review of Systems  DOE and dry cough as above.  He denies chest pain, palpitations, pnd, orthopnea, n, v, dizziness, syncope, edema, weight gain, or early satiety.  All other systems reviewed and are otherwise negative except as noted above.  Physical Exam  Blood pressure 112/66, pulse 88, height  (1.753 m), weight 201 lb (91.173 kg).  General: Pleasant, NAD Psych: Normal affect. Neuro: Alert and oriented X 3. Moves all extremities spontaneously. HEENT: Normal  Neck: Supple without bruits or JVD. Lungs:  Resp regular and unlabored, CTA. Heart: RRR no s3, s4, or murmurs. Abdomen: Soft, non-tender, non-distended, BS + x 4.  Extremities: No clubbing, cyanosis or edema. DP/PT/Radials 2+ and equal bilaterally.  Accessory Clinical Findings  ECG - RSR, 72, LAD, LAFB - no acute st/t changes.  Echocardiogram: 09/12/2014 Left ventricle: The cavity size was normal. Wall  thickness was increased in a pattern of mild LVH. Systolic function was normal. The estimated ejection fraction was in the range of 55% to 60%. Doppler parameters are consistent with abnormal left ventricular relaxation (grade 1 diastolic dysfunction). - Aortic valve: There was trivial regurgitation. - Mitral valve: There was mild regurgitation.  CT chest performed on 09/02/2014 IMPRESSION: 1. No evidence of pulmonary embolism. 2. The appearance of the lungs could suggest interstitial lung disease. Specifically, the appearance is suspicious for potential nonspecific interstitial pneumonia (NSIP). Repeat evaluation with high-resolution chest CT in 6 months is suggested to assess for temporal changes in the appearance of the lung parenchyma. 3. Atherosclerosis,  including left main and left anterior descending coronary artery disease. Assessment for potential risk factor modification, dietary therapy or pharmacologic therapy may be warranted, if clinically indicated.  Results discussed with patient.  Rec pulm f/u re: CT findings and f/u CT in 6 months @ pulmonary discretion.  Lexi scan nuclear stress test performed on 09/07/2014 Quantitative Gated Spect Images QGS EDV: 97 ml QGS ESV: 42 ml  Impression Exercise Capacity: Lexiscan with low level exercise. BP Response: Normal blood pressure response. Clinical Symptoms: Shortness of breath ECG Impression: No significant ST segment change suggestive of ischemia. Comparison with Prior Nuclear Study: The study is compared to the report of the study from 2009  Overall Impression: Normal stress nuclear study. This is a low risk scan. There is no scar or ischemia.  LV Ejection Fraction: 57%. LV Wall Motion: Normal Wall Motion   Carotid Doppler: 05/2015 - Findings consistent with 1-39 percent stenosis involving the  right internal carotid artery and the left internal carotid  artery. - Right vertebral artery flow  is antegrade. Left vertebral artery  flow is very diminished and retrograde. Bilateral subclavian  arteries appear within normal limits.    Assessment & Plan  1.  TIA - in October 2016, no evidence of prior atrial fibrillation, per neurology etiology most probably in his carotids. We will follow and if another episode of TIA we will place on event monitor for more loop monitor. So far echocardiogram normal and no suggestive or risk for atrial fibrillation. Continue aspirin for now. Interestingly his carotid ultrasound showed only minimal plaque.  2. 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  His dyspnea has persisted despite two rounds of abx and a steroid inj in December.  He now experienced DOE after walking only about 50 yds, his cardiac workup is completely negative as stated about his d-dimer was mildly elevated and he underwent CT chest that only showed emphysema and interstitial lung disease, he is already following with pulmonary doctor that currently doesn't have any suggestion in history peak right now. His echocardiogram only showed diastolic dysfunction but normal elevated filling pressures. His stress test was negative for prior scar or ischemia. He is now improved and PFTs improved compared to last year.  2. Chronic diastolic CHF - grade 1 diastolic dysfunction with normal filling pressure, patient is euvolemic, although compensated and doesn't need any changes to his therapy at this point.  3.  Non-obstructive bilateral carotid arterial disease:  1-39% bilat stenosis on last U/S in 05/2013 with plan for f/u U/S this year.  4. Hyperlipidemia - started on atorvastatin 40 mg po daily, we will check LFTs today.  Follow-up in 6 months   Lars Masson, NP 10/06/2015, 8:34 AM

## 2015-10-06 NOTE — Patient Instructions (Signed)
Medication Instructions:  Your physician recommends that you continue on your current medications as directed. Please refer to the Current Medication list given to you today.   Labwork: Your physician recommends that you have lab work today: bmet/lft   Testing/Procedures: -None  Follow-Up: Your physician wants you to follow-up in: 6 months with Dr. Delton See.  You will receive a reminder letter in the mail two months in advance. If you don't receive a letter, please call our office to schedule the follow-up appointment.   Any Other Special Instructions Will Be Listed Below (If Applicable).     If you need a refill on your cardiac medications before your next appointment, please call your pharmacy.

## 2015-10-24 ENCOUNTER — Encounter: Payer: Self-pay | Admitting: Nurse Practitioner

## 2015-10-24 ENCOUNTER — Ambulatory Visit (INDEPENDENT_AMBULATORY_CARE_PROVIDER_SITE_OTHER): Payer: Medicare Other | Admitting: Nurse Practitioner

## 2015-10-24 VITALS — BP 113/76 | HR 93 | Ht 69.0 in | Wt 202.8 lb

## 2015-10-24 DIAGNOSIS — E785 Hyperlipidemia, unspecified: Secondary | ICD-10-CM | POA: Insufficient documentation

## 2015-10-24 DIAGNOSIS — E782 Mixed hyperlipidemia: Secondary | ICD-10-CM | POA: Diagnosis not present

## 2015-10-24 DIAGNOSIS — G2581 Restless legs syndrome: Secondary | ICD-10-CM | POA: Diagnosis not present

## 2015-10-24 DIAGNOSIS — G4710 Hypersomnia, unspecified: Secondary | ICD-10-CM

## 2015-10-24 DIAGNOSIS — G459 Transient cerebral ischemic attack, unspecified: Secondary | ICD-10-CM | POA: Diagnosis not present

## 2015-10-24 DIAGNOSIS — G4730 Sleep apnea, unspecified: Secondary | ICD-10-CM

## 2015-10-24 DIAGNOSIS — R209 Unspecified disturbances of skin sensation: Secondary | ICD-10-CM | POA: Diagnosis not present

## 2015-10-24 NOTE — Progress Notes (Signed)
[facility]  PATIENT: Jeffrey Cobb DOB: 18-May-1941   REASON FOR VISIT: Follow-up for TIA, history of restless legs, obstructive sleep apnea  HISTORY FROM: Patient    HISTORY OF PRESENT ILLNESS: HistoryMr. Cobb, 75 year old male returns for followup. He has a history of restless leg syndrome and sensory dysesthesias. He also has a history of obstructive sleep apnea with good compliance of CPAP in the past however due to his chronic cough in October last year which did not respond to antibiotics or prednisone. He has been unable to use his CPAP since that time . He was referred to ENT and now to a larynx specialist at [facility]. On today's visit he also reports continued numbness, tingling/burning and discomfort in his feet, this is been an ongoing problem but it has worsened. EMG nerve conduction after last visit was normal. He has been placed on a trial of Neurontin by his primary care however had side effects of dizziness on the medication.He has a history of spinal stenosis and lumbar spine decompression 2011 by Dr. Gerlene Fee. He has had 2 epidural injections since last seen with minimal benefit. He denies any falls however he does feel off balance at times. He has intermittent back pain since his surgery. His restless legs are under good control with his Mirapex. He returns for reevaluation  MRI of the spine (lumbar) that he had done in April of 2009 showed moderate to severe spinal stenosis. He had lumbar spine decompression and fusion by Dr. Gerlene Fee in 2011, did very well. He also has a history of right and left knee replacements and a shoulder replacement. He is currently taking Mirapex for his restless legs tolerating the medication without drowsiness or any type of compulsive behaviors. iron profile was normal.   Update 12/5/2016PS : Patient is seen today for follow-up after her recent hospital admission for TIA on 05/31/15. He presented following an episode  of word finding difficulty as well as slurred speech. He was last known well at 4:30 PM on 05/31/2015. Deficits lasted about 45 minutes then resolved. He has no previous history of stroke nor TIA. He was seen initially at [facility] and subsequently transferred to [facility] for further management. He had not been on antiplatelets therapy. CT scan of his head showed no acute intracranial abnormality. NIH stroke score on admission evaluation was 0. Patient was not administered TPA secondary to Deficits resolved rapidly. He was admitted for further evaluation and treatment. MRI scan of the brain showed no acute infarct only mild age-related changes of cerebral atrophy and chronic small vessel ischemia. MRA of the brain showed no large vessel stenosis. Mild distal atheromatous changes were noted in MCA and PCA branches. Carotid ultrasound showed no significant Stenosis. Transthoracic echo showed normal ejection fraction. NIH stroke scale was 0. ABCD 2 score was 4. Patient qualified for and signed consent and participate in the PARFAIT TIA study. LDL cholesterol was elevated at 103 and he was started on Lipitor. He states his done well since discharge and has not had any recurrent stroke or TIA symptoms. He was seen by Dr. Roda Shutters on 06/28/15 for 30 days study visit and study medication was discontinued. He is currently on aspirin 81 mg which is tolerating well without bleeding or bruising. He is also not having any side effects on Lipitor 40 mg daily which is taking everyday. He has not had any new complaints.  UPDATE 10/24/2015 Jeffrey Cobb, 75 year old returns for followup. He was last seen in  the office by Dr. Pearlean Brownie 07/31/2015. He has history of TIA with an episode of word finding and slurred speech on 05/31/2015. He was started on baby aspirin. MRI without acute infarct. MRA no large vessel stenosis. Carotid ultrasound negative. LDL cholesterol was elevated and he was started on Lipitor. His restless legs are in good control with  Mirapex. He is also on amitriptyline at bedtime. He remains on Ambien for insomnia. He does not use his CPAP for his obstructive sleep apnea although he says he may start back.He has not had further stroke or TIA symptoms. He returns for reevaluation REVIEW OF SYSTEMS: Full 14 system review of systems performed and notable only for those listed, all others are neg:  Constitutional: neg  Cardiovascular: neg Ear/Nose/Throat: Hearing loss Skin: neg Eyes: neg Respiratory: neg Gastroitestinal: neg  Hematology/Lymphatic: Easy bruising  Endocrine: Cold intolerance Musculoskeletal: Joint pain Allergy/Immunology: neg Neurological: neg Psychiatric: neg Sleep : Restless legs obstructive sleep apnea and does not use CPAP   ALLERGIES: Allergies  Allergen Reactions  . Gabapentin     Dizziness   . Sulfonamide Derivatives Rash  . Trazodone And Nefazodone Rash and Other (See Comments)    Hallucinations     HOME MEDICATIONS: Outpatient Prescriptions Prior to Visit  Medication Sig Dispense Refill  . amitriptyline (ELAVIL) 25 MG tablet Take 25 mg by mouth daily.  11  . atorvastatin (LIPITOR) 40 MG tablet Take 1 tablet (40 mg total) by mouth daily at 6 PM. 30 tablet 0  . benzonatate (TESSALON) 200 MG capsule Take 1 capsule by mouth 3 (three) times daily as needed. For coughing    . doxazosin (CARDURA) 2 MG tablet Take 2 mg by mouth daily.     . fluticasone (FLONASE) 50 MCG/ACT nasal spray Place 50 sprays into both nostrils as needed.  3  . meloxicam (MOBIC) 15 MG tablet Take 1 tablet by mouth daily.    . pramipexole (MIRAPEX) 0.5 MG tablet 1 tab in the a.m and 1 tab in the p.m 180 tablet 3  . PROAIR HFA 108 (90 BASE) MCG/ACT inhaler Inhale 2 puffs into the lungs every 6 (six) hours as needed.    . ranitidine (ZANTAC) 150 MG tablet Take 150 mg by mouth at bedtime.    . traMADol (ULTRAM) 50 MG tablet Take 1 tablet (50 mg total) by mouth every 6 (six) hours as needed. 50 tablet 2  . zolpidem  (AMBIEN) 10 MG tablet Take 10 mg by mouth at bedtime as needed for sleep.   0   No facility-administered medications prior to visit.    PAST MEDICAL HISTORY: Past Medical History  Diagnosis Date  . Occlusion and stenosis of carotid artery without mention of cerebral infarction     a. 05/2013 Carotid U/S: 1-39% bilat stenosis.  . Obstructive chronic bronchitis with exacerbation (HCC)   . Other emphysema (HCC)   . Mixed hyperlipidemia   . Persistent disorder of initiating or maintaining sleep   . Acute sinusitis, unspecified   . GERD (gastroesophageal reflux disease)   . Arthritis   . Sleep apnea     a. on CPAP.  Marland Kitchen Stroke [facility])     PAST SURGICAL HISTORY: Past Surgical History  Procedure Laterality Date  . Spine surgery  2000/2011  . Appendectomy  1956  . Ruptured disk  2000  . Left knee replacement  2001  . Right knee replacement  2005  . Joint replacement Right     shoulder replacement  . Eye surgery  Bilateral     cataract removal  . Torn retina    . Total shoulder arthroplasty Left 06/30/2013    Procedure: TOTAL SHOULDER ARTHROPLASTY;  Surgeon: Loreta Ave, MD;  Location: [facility] OR;  Service: Orthopedics;  Laterality: Left;  . Right ankle reconstruction      FAMILY HISTORY: Family History  Problem Relation Age of Onset  . Emphysema Mother   . Heart disease Mother   . Alzheimer's disease Mother   . Heart disease Father   . Allergies      FH: FATHER,2 SISTER,1 BROTHER, SON  DAUGHTER  . Cancer Sister   . Stroke Paternal Grandfather     SOCIAL HISTORY: Social History   Social History  . Marital Status: Married    Spouse Name: Myriam Jacobson   . Number of Children: 2  . Years of Education: Assoc    Occupational History  . Retired    Social History Main Topics  . Smoking status: Former Smoker -- 3.00 packs/day for 17 years    Types: Cigarettes    Quit date: 08/26/1977  . Smokeless tobacco: Never Used  . Alcohol Use: Yes     Comment: rarely  . Drug Use: No  .  Sexual Activity: Not on file   Other Topics Concern  . Not on file   Social History Narrative   Patient is married Myriam Jacobson) and lives at home with his wife.   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    Patient is left handed   Patient drinks 1-2 cups of coffee in the morning and 3-4 cups of tea daily.                 PHYSICAL EXAM  Filed Vitals:   10/24/15 0944  BP: 113/76  Pulse: 93  Height: 5\' 9"  (1.753 m)  Weight: 202 lb 12.8 oz (91.989 kg)   Body mass index is 29.93 kg/(m^2). Generalized: Well developed, Mildly obese male in no acute distress  Head: normocephalic and atraumatic,.  Neck: Supple, no carotid bruits  Cardiac: Regular rate rhythm, no murmur  Musculoskeletal: Arthritic changes in hands  Neurological examination   Mentation: Alert oriented to time, place, history taking. Follows all commands speech and language fluent.  Cranial nerve II-XII: Pupils were equal round reactive to light extraocular movements were full, visual field were full on confrontational test. Facial sensation and strength were normal. hearing was intact to finger rubbing bilaterally. Uvula tongue midline. head turning and shoulder shrug were normal and symmetric.Tongue protrusion into cheek strength was normal. Motor: normal bulk and tone, full strength in the BUE, BLE, fine finger movements normal, no pronator drift. No focal weakness Sensory: Mildly decreased light touch and pinprick to mid shin bilaterally, vibration normal. Proprioception normal  Coordination: finger-nose-finger, heel-to-shin bilaterally, no dysmetria Reflexes: Brachioradialis 2/2, biceps 2/2, triceps 2/2, patellar 0/0, Achilles 1/1, plantar responses were flexor bilaterally. Gait and Station: Rising up from seated position without assistance, normal stance, moderate stride, good arm swing, smooth turning, able to perform tiptoe, and heel walking without difficulty. Tandem  gait is mildly unsteady. No assistive device     DIAGNOSTIC DATA (LABS, IMAGING, TESTING) - I reviewed patient records, labs, notes, testing and imaging myself where available.  Lab Results  Component Value Date   WBC 6.7 05/31/2015   HGB 14.6 05/31/2015   HCT 41.5 05/31/2015   MCV 89.4 05/31/2015   PLT 138* 05/31/2015  Component Value Date/Time   NA 134* 10/06/2015 0857   K 4.2 10/06/2015 0857   CL 99 10/06/2015 0857   CO2 27 10/06/2015 0857   GLUCOSE 67 10/06/2015 0857   BUN 14 10/06/2015 0857   CREATININE 1.13 10/06/2015 0857   CREATININE 1.12 06/01/2015 2038   CALCIUM 8.9 10/06/2015 0857   PROT 6.8 10/06/2015 0857   ALBUMIN 3.7 10/06/2015 0857   AST 22 10/06/2015 0857   ALT 17 10/06/2015 0857   ALKPHOS 70 10/06/2015 0857   BILITOT 0.5 10/06/2015 0857   GFRNONAA >60 06/01/2015 2038   GFRAA >60 06/01/2015 2038   Lab Results  Component Value Date   CHOL 170 06/01/2015   HDL 58 06/01/2015   LDLCALC 103* 06/01/2015   TRIG 46 06/01/2015   CHOLHDL 2.9 06/01/2015   Lab Results  Component Value Date   HGBA1C 5.7* 06/01/2015   No results found for: [id number] Lab Results  Component Value Date   TSH 2.265 06/01/2015      ASSESSMENT AND PLAN 75 y.o. year old male has a past medical history of restless leg syndrome Occlusion and stenosis of carotid artery without mention of cerebral infarction; Obstructive chronic bronchitis with exacerbation; Other emphysema; Mixed hyperlipidemia; Persistent disorder of initiating or maintaining sleep; Sleep apnea. and increased paresthesias in both feet bilaterally. Recent left hemispheric TIA on 05/31/15. No further stroke or TIA symptoms  Continue aspirin for secondary stroke prevention Blood pressure with systolic 130/90 or below today's reading 113/76 LDL goal below 100 remain on Lipitor Exercise regularly, healthy diet Continue Mirapex at current dose for restless legs Continue Ambien for chronic insomnia Follow-up  with me in 6 months Nilda Riggs, [facility], [facility], APRN  [facility] [address], [address] [place], [place] [address] [phone]

## 2015-10-24 NOTE — Progress Notes (Signed)
I agree with the above plan 

## 2015-10-24 NOTE — Patient Instructions (Signed)
Continue aspirin for secondary stroke prevention Blood pressure with systolic 130/90 or below today's reading 113/76 LDL goal below 100 remain on Lipitor Exercise regularly, healthy diet Continue Mirapex at current dose for restless legs Continue Ambien for chronic insomnia Follow-up with me in 6 months

## 2016-01-06 ENCOUNTER — Other Ambulatory Visit: Payer: Self-pay | Admitting: Neurology

## 2016-01-09 ENCOUNTER — Other Ambulatory Visit: Payer: Self-pay | Admitting: Neurology

## 2016-01-25 ENCOUNTER — Ambulatory Visit (INDEPENDENT_AMBULATORY_CARE_PROVIDER_SITE_OTHER)
Admission: RE | Admit: 2016-01-25 | Discharge: 2016-01-25 | Disposition: A | Discharge status: Home / Self Care | Payer: Medicare Other | Source: Ambulatory Visit | Attending: Pulmonary Disease | Admitting: Pulmonary Disease

## 2016-01-25 DIAGNOSIS — R911 Solitary pulmonary nodule: Secondary | ICD-10-CM | POA: Diagnosis not present

## 2016-01-25 DIAGNOSIS — J849 Interstitial pulmonary disease, unspecified: Secondary | ICD-10-CM

## 2016-01-26 ENCOUNTER — Ambulatory Visit (INDEPENDENT_AMBULATORY_CARE_PROVIDER_SITE_OTHER): Payer: Medicare Other | Admitting: Pulmonary Disease

## 2016-01-26 ENCOUNTER — Encounter: Payer: Self-pay | Admitting: Pulmonary Disease

## 2016-01-26 VITALS — BP 144/82 | HR 84 | Ht 69.0 in | Wt 206.0 lb

## 2016-01-26 DIAGNOSIS — J410 Simple chronic bronchitis: Secondary | ICD-10-CM

## 2016-01-26 DIAGNOSIS — R911 Solitary pulmonary nodule: Secondary | ICD-10-CM

## 2016-01-26 DIAGNOSIS — J849 Interstitial pulmonary disease, unspecified: Secondary | ICD-10-CM | POA: Diagnosis not present

## 2016-01-26 DIAGNOSIS — R053 Chronic cough: Secondary | ICD-10-CM

## 2016-01-26 DIAGNOSIS — R05 Cough: Secondary | ICD-10-CM | POA: Diagnosis not present

## 2016-01-26 NOTE — Assessment & Plan Note (Signed)
He has been noncompliant with Breo. He says that "none of the medicines work".  He has a dry mouth, so I wonder if tripod her inhalers exacerbate his cough and that's why he's been expressing worsening symptoms.  Because he has been having dyspnea relieved by albuterol I do think COPD contribute significantly to his symptoms.  Plan: Trial of Stiolto, sample given Follow-up in 3-4 weeks

## 2016-01-26 NOTE — Assessment & Plan Note (Signed)
Multifactorial due to acid reflux, postnasal drip, and irritable larynx and rhythm.  Plan: Follow-up with Dr. Delford FieldWright Keep taking an antacid therapy Take allergic rhinitis treatment

## 2016-01-26 NOTE — Assessment & Plan Note (Signed)
These were stable on the most recent CT chest (yesterday), no further imaging needed.

## 2016-01-26 NOTE — Patient Instructions (Signed)
Take Stiolto 2 puffs daily, no matter how you feel We will order a lung function test We will see you back in 3-4 weeks to discuss this result

## 2016-01-26 NOTE — Progress Notes (Signed)
Subjective:    Patient ID: Jeffrey Cobb, male    DOB: [birth year], 75 y.o.   MRN: [id number]  Synopsis: COPD and chronic cough, ?pulmonary fibrosis PFT"s 2009:  FEV1 3.49 (115%), ratio 65, DLCO 67%. No change with maintenance meds.  PFT's 08/2014:  FEV1 3.13 (114%), ratio 69, decreased airtrapping from 2009, DLCO 49% 2016 CT Chest showed ?early UIP and pulmonary nodules felt to be inflammatory Quit smoking in 1979 after 16 years of smoking 2 ppd Failed Gabapentin for cough in 2016 due to dizziness Failed Symbicort and Spiriva in that he felt like they really didn't help symptoms  HPI Chief Complaint  Patient presents with  . Follow-up    Review CT chest.  pt c/o stable SOB with exertion.      [name] says he is "lousy".  He says that he has been having more shortness of breath that is fairly persistent.  It will come on suddenly however and albuterol tends to make it better.  He says that all the medicines "don't work" except albuterol.    The coughing is not as severe as it was 2 years ago.  He still has coughing spells from time to time, but in general they are better than before.   He doesn't think that the dyspnea is getting worse.   He does not take the [facility] because "it doesn't do any good".  He took it for about 4 months, he stopped taking it about 2 months ago.  No flare ups of his COPD, no prednisone treatments, etc.  GERD symptoms: none  He has some sinus congestion and difficulty breathing through his nose.     Past Medical History  Diagnosis Date  . Occlusion and stenosis of carotid artery without mention of cerebral infarction     a. 05/2013 Carotid U/S: 1-39% bilat stenosis.  . Obstructive chronic bronchitis with exacerbation (HCC)   . Other emphysema (HCC)   . Mixed hyperlipidemia   . Persistent disorder of initiating or maintaining sleep   . Acute sinusitis, unspecified   . GERD (gastroesophageal reflux disease)   . Arthritis   . Sleep apnea    a. on CPAP.  Marland Kitchen Stroke [facility])       Review of Systems  Constitutional: Negative for fever, chills and fatigue.  HENT: Positive for rhinorrhea. Negative for sinus pressure and sneezing.   Respiratory: Positive for cough. Negative for shortness of breath and wheezing.   Cardiovascular: Negative for chest pain, palpitations and leg swelling.       Objective:   Physical Exam Filed Vitals:   01/26/16 1329  BP: 144/82  Pulse: 84  Height:  (1.753 m)  Weight: 206 lb (93.441 kg)  SpO2: 99%   RA  Gen: well appearing HENT: OP clear, TM's clear, neck supple PULM: Crackles bases, normal percussion CV: RRR, no mgr, trace edema GI: BS+, soft, nontender Derm: no cyanosis or rash Psyche: foul mood but not overly emotional  HRCT from yesterday reviewed> traction bronchiolectasis and interlobular septal thickening in a basilar predominance have progressed compared to July 2016, there remains no frank honeycombing. There are multiple pulmonary nodules which are stable or have resolved.     Assessment & Plan:  Solitary pulmonary nodule These were stable on the most recent CT chest (yesterday), no further imaging needed.  Interstitial lung disease (HCC) I'm very concerned about his progression on his CT scan of his chest. I've looked at the images myself and while I don't  see frank honeycombing any sort of basilar fibrotic process which is progressing over time and a 75 year old former smoking male is worrisome for usual interstitial pneumonitis.  The only way to figure out if he had UIP would be to perform a biopsy but I think that that would be excessive.  Alternatively, I suppose his findings could be explained by recurrent aspiration and acid reflux, but he denies any of those symptoms at this time. The differential diagnosis also includes nonspecific interstitial pneumonitis with fibrotic features. Because he has no underlying connective tissue disease at think this seems less  likely.  Plan: Repeat PFT now, if evidence of worsening pulmonary function then that would compel me to treat him for idiopathic pulmonary fibrosis We'll check blood work for other causes of interstitial lung disease and connective tissue disease on the next visit Follow-up in 3 weeks  Chronic cough Multifactorial due to acid reflux, postnasal drip, and irritable larynx and rhythm.  Plan: Follow-up with Dr. Delford FieldWright Keep taking an antacid therapy Take allergic rhinitis treatment  COPD (chronic obstructive pulmonary disease) (HCC) He has been noncompliant with Breo. He says that "none of the medicines work".  He has a dry mouth, so I wonder if tripod her inhalers exacerbate his cough and that's why he's been expressing worsening symptoms.  Because he has been having dyspnea relieved by albuterol I do think COPD contribute significantly to his symptoms.  Plan: Trial of Stiolto, sample given Follow-up in 3-4 weeks  > 25 minutes spent face to face, > 50 minutes total on this visit including reviewing images and records on his case   Current outpatient prescriptions:  .  amitriptyline (ELAVIL) 25 MG tablet, Take 25 mg by mouth daily., Disp: , Rfl: 11 .  aspirin 81 MG tablet, Take 81 mg by mouth daily., Disp: , Rfl:  .  atorvastatin (LIPITOR) 40 MG tablet, Take 1 tablet (40 mg total) by mouth daily at 6 PM., Disp: 30 tablet, Rfl: 0 .  benzonatate (TESSALON) 200 MG capsule, Take 1 capsule by mouth 3 (three) times daily as needed. For coughing, Disp: , Rfl:  .  doxazosin (CARDURA) 2 MG tablet, Take 2 mg by mouth daily. , Disp: , Rfl:  .  fluticasone (FLONASE) 50 MCG/ACT nasal spray, Place 50 sprays into both nostrils as needed., Disp: , Rfl: 3 .  meloxicam (MOBIC) 15 MG tablet, Take 1 tablet by mouth daily., Disp: , Rfl:  .  pramipexole (MIRAPEX) 0.5 MG tablet, 1 tab in the a.m and 1 tab in the p.m, Disp: 180 tablet, Rfl: 3 .  pramipexole (MIRAPEX) 0.5 MG tablet, TAKE 1 TABLET BY MOUTH  EVERY MORNING AND TAKE 1 TABLET EVERY EVENING, Disp: 180 tablet, Rfl: 2 .  PROAIR HFA 108 (90 BASE) MCG/ACT inhaler, Inhale 2 puffs into the lungs every 6 (six) hours as needed., Disp: , Rfl:  .  ranitidine (ZANTAC) 150 MG tablet, Take 150 mg by mouth at bedtime., Disp: , Rfl:  .  traMADol (ULTRAM) 50 MG tablet, Take 1 tablet (50 mg total) by mouth every 6 (six) hours as needed., Disp: 50 tablet, Rfl: 2 .  zolpidem (AMBIEN) 10 MG tablet, Take 10 mg by mouth at bedtime as needed for sleep. , Disp: , Rfl: 0 .  fluticasone furoate-vilanterol (BREO ELLIPTA) 100-25 MCG/INH AEPB, Inhale 1 puff into the lungs daily., Disp: , Rfl:

## 2016-01-26 NOTE — Assessment & Plan Note (Signed)
I'm very concerned about his progression on his CT scan of his chest. I've looked at the images myself and while I don't see frank honeycombing any sort of basilar fibrotic process which is progressing over time and a 75 year old former smoking male is worrisome for usual interstitial pneumonitis.  The only way to figure out if he had UIP would be to perform a biopsy but I think that that would be excessive.  Alternatively, I suppose his findings could be explained by recurrent aspiration and acid reflux, but he denies any of those symptoms at this time. The differential diagnosis also includes nonspecific interstitial pneumonitis with fibrotic features. Because he has no underlying connective tissue disease at think this seems less likely.  Plan: Repeat PFT now, if evidence of worsening pulmonary function then that would compel me to treat him for idiopathic pulmonary fibrosis We'll check blood work for other causes of interstitial lung disease and connective tissue disease on the next visit Follow-up in 3 weeks

## 2016-01-26 NOTE — Addendum Note (Signed)
Addended by: Velvet BatheAULFIELD, Aaliyan Brinkmeier L on: 01/26/2016 02:07 PM   Modules accepted: Orders

## 2016-02-08 ENCOUNTER — Encounter: Payer: Self-pay | Admitting: Nurse Practitioner

## 2016-02-08 ENCOUNTER — Ambulatory Visit (INDEPENDENT_AMBULATORY_CARE_PROVIDER_SITE_OTHER): Payer: Medicare Other | Admitting: Nurse Practitioner

## 2016-02-08 VITALS — BP 150/87 | HR 85 | Ht 69.0 in | Wt 208.8 lb

## 2016-02-08 DIAGNOSIS — E785 Hyperlipidemia, unspecified: Secondary | ICD-10-CM | POA: Diagnosis not present

## 2016-02-08 DIAGNOSIS — R209 Unspecified disturbances of skin sensation: Secondary | ICD-10-CM | POA: Diagnosis not present

## 2016-02-08 DIAGNOSIS — I779 Disorder of arteries and arterioles, unspecified: Secondary | ICD-10-CM | POA: Diagnosis not present

## 2016-02-08 DIAGNOSIS — G459 Transient cerebral ischemic attack, unspecified: Secondary | ICD-10-CM | POA: Diagnosis not present

## 2016-02-08 DIAGNOSIS — I739 Peripheral vascular disease, unspecified: Secondary | ICD-10-CM

## 2016-02-08 DIAGNOSIS — G2581 Restless legs syndrome: Secondary | ICD-10-CM

## 2016-02-08 NOTE — Progress Notes (Addendum)
[facility]  PATIENT: Jeffrey Cobb DOB: 10/23/1940   REASON FOR VISIT: Follow-up for CVA, restless legs, paresthesias, dyslipidemia, obstructive sleep apnea HISTORY FROM: Patient    HISTORY OF PRESENT ILLNESS:HistoryMr. Cobb, 75 year old male returns for followup. He has a history of restless leg syndrome and sensory dysesthesias. He also has a history of obstructive sleep apnea with good compliance of CPAP in the past however due to his chronic cough in October last year which did not respond to antibiotics or prednisone. He has been unable to use his CPAP since that time . He was referred to ENT and [facility]. On today's visit he also reports continued numbness, tingling/burning and discomfort in his feet, this is been an ongoing problem but it has worsened. EMG nerve conduction after last visit was normal. He has been placed on a trial of Neurontin by his primary care however had side effects of dizziness on the medication.He has a history of spinal stenosis and lumbar spine decompression 2011 by Dr. Gerlene FeeKritzer. He has had 2 epidural injections since last seen with minimal benefit. He denies any falls however he does feel off balance at times. He has intermittent back pain since his surgery. His restless legs are under good control with his Mirapex. He returns for reevaluation  MRI of the spine (lumbar) that he had done in April of 2009 showed moderate to severe spinal stenosis. He had lumbar spine decompression and fusion by Dr. Gerlene FeeKritzer in 2011, did very well. He also has a history of right and left knee replacements and a shoulder replacement. He is currently taking Mirapex for his restless legs tolerating the medication without drowsiness or any type of compulsive behaviors. iron profile was normal.   Update 12/5/2016PS : Patient is seen today for follow-up after her recent hospital admission for TIA on 05/31/15. He presented  following an episode of word finding difficulty as well as slurred speech. He was last known well at 4:30 PM on 05/31/2015. Deficits lasted about 45 minutes then resolved. He has no previous history of stroke nor TIA. [facility] and [facility]. He had not been on antiplatelets therapy. CT scan of his head showed no acute intracranial abnormality. NIH stroke score on admission evaluation was 0. Patient was not administered TPA secondary to Deficits resolved rapidly. He was admitted for further evaluation and treatment. MRI scan of the brain showed no acute infarct only mild age-related changes of cerebral atrophy and chronic small vessel ischemia. MRA of the brain showed no large vessel stenosis. Mild distal atheromatous changes were noted in MCA and PCA branches. Carotid ultrasound showed no significant Stenosis. Transthoracic echo showed normal ejection fraction. NIH stroke scale was 0. ABCD 2 score was 4. Patient qualified for and signed consent and participate in the PARFAIT TIA study. LDL cholesterol was elevated at 103 and he was started on Lipitor. He states his done well since discharge and has not had any recurrent stroke or TIA symptoms. He was seen by Dr. Roda Cobb on 06/28/15 for 30 days study visit and study medication was discontinued. He is currently on aspirin 81 mg which is tolerating well without bleeding or bruising. He is also not having any side effects on Lipitor 40 mg daily which is taking everyday. He has not had any new complaints.  UPDATE 10/24/2015 Jeffrey Cobb, 75 year old returns for followup. He was last seen in the office  by Dr. Pearlean Brownie 07/31/2015. He has history of TIA with an episode of word finding and slurred speech on 05/31/2015. He was started on baby aspirin. MRI without acute infarct. MRA no large vessel stenosis. Carotid ultrasound negative. LDL cholesterol was elevated and he was started on Lipitor. His restless legs are  in good control with Mirapex. He is also on amitriptyline at bedtime. He remains on Ambien for insomnia. He does not use his CPAP for his obstructive sleep apnea although he says he may start back.He has not had further stroke or TIA symptoms. He returns for reevaluation UPDATE 06/15/2017[name], 75 year old male returns for follow-up. History of TIA events with word finding difficulty and slurred speech which occurred in October 2016. He has not had further stroke or TIA symptoms and is currently on aspirin and Lipitor for secondary stroke prevention. He also has a history of restless legs which are in good control with Mirapex. His amitriptyline has been tapered by his pulmonologist. He is currently not using CPAP due to shortness of breath. He is due for a pulmonary function next week.He continues to complain of  numbness, tingling/burning and discomfort in his feet, this is been an ongoing problem , EMG nerve conduction in the past x 2  was normal.. He returns for reevaluation REVIEW OF SYSTEMS: Full 14 system review of systems performed and notable only for those listed, all others are neg:  Constitutional: neg  Cardiovascular: neg Ear/Nose/Throat: neg  Skin: neg Eyes: neg Respiratory: Shortness of breath cough Gastroitestinal: neg  Hematology/Lymphatic: neg  Endocrine: neg Musculoskeletal: Joint pain back pain Allergy/Immunology: neg Neurological: neg Psychiatric: neg Sleep : Restless legs, obstructive sleep apnea   ALLERGIES: Allergies  Allergen Reactions  . Gabapentin     Dizziness   . Sulfonamide Derivatives Rash  . Trazodone And Nefazodone Rash and Other (See Comments)    Hallucinations     HOME MEDICATIONS: Outpatient Prescriptions Prior to Visit  Medication Sig Dispense Refill  . amitriptyline (ELAVIL) 25 MG tablet Take 25 mg by mouth daily.  11  . aspirin 81 MG tablet Take 81 mg by mouth daily.    Marland Kitchen atorvastatin (LIPITOR) 40 MG tablet Take 1 tablet (40 mg  total) by mouth daily at 6 PM. 30 tablet 0  . doxazosin (CARDURA) 2 MG tablet Take 2 mg by mouth daily.     . fluticasone (FLONASE) 50 MCG/ACT nasal spray Place 50 sprays into both nostrils as needed.  3  . meloxicam (MOBIC) 15 MG tablet Take 1 tablet by mouth daily.    . pramipexole (MIRAPEX) 0.5 MG tablet 1 tab in the a.m and 1 tab in the p.m 180 tablet 3  . pramipexole (MIRAPEX) 0.5 MG tablet TAKE 1 TABLET BY MOUTH EVERY MORNING AND TAKE 1 TABLET EVERY EVENING 180 tablet 2  . PROAIR HFA 108 (90 BASE) MCG/ACT inhaler Inhale 2 puffs into the lungs every 6 (six) hours as needed.    . ranitidine (ZANTAC) 150 MG tablet Take 150 mg by mouth at bedtime.    Marland Kitchen zolpidem (AMBIEN) 10 MG tablet Take 10 mg by mouth at bedtime as needed for sleep.   0  . benzonatate (TESSALON) 200 MG capsule Take 1 capsule by mouth 3 (three) times daily as needed. For coughing    . fluticasone furoate-vilanterol (BREO ELLIPTA) 100-25 MCG/INH AEPB Inhale 1 puff into the lungs daily.    . traMADol (ULTRAM) 50 MG tablet Take 1 tablet (50 mg total) by mouth every 6 (  six) hours as needed. 50 tablet 2   No facility-administered medications prior to visit.    PAST MEDICAL HISTORY: Past Medical History  Diagnosis Date  . Occlusion and stenosis of carotid artery without mention of cerebral infarction     a. 05/2013 Carotid U/S: 1-39% bilat stenosis.  . Obstructive chronic bronchitis with exacerbation (HCC)   . Other emphysema (HCC)   . Mixed hyperlipidemia   . Persistent disorder of initiating or maintaining sleep   . Acute sinusitis, unspecified   . GERD (gastroesophageal reflux disease)   . Arthritis   . Sleep apnea     a. on CPAP.  Marland Kitchen Stroke [facility])     PAST SURGICAL HISTORY: Past Surgical History  Procedure Laterality Date  . Spine surgery  2000/2011  . Appendectomy  1956  . Ruptured disk  2000  . Left knee replacement  2001  . Right knee replacement  2005  . Joint replacement Right     shoulder replacement  .  Eye surgery Bilateral     cataract removal  . Torn retina    . 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 Right ankle reconstruction      FAMILY HISTORY: Family History  Problem Relation Age of Onset  . Emphysema Mother   . Heart disease Mother   . Alzheimer's disease Mother   . Heart disease Father   . Allergies      FH: FATHER,2 SISTER,1 BROTHER, SON  DAUGHTER  . Cancer Sister   . Stroke Paternal Grandfather     SOCIAL HISTORY: Social History   Social History  . Marital Status: Married    Spouse Name: Myriam Jacobson   . Number of Children: 2  . Years of Education: Assoc    Occupational History  . Retired    Social History Main Topics  . Smoking status: Former Smoker -- 3.00 packs/day for 17 years    Types: Cigarettes    Quit date: 08/26/1977  . Smokeless tobacco: Never Used  . Alcohol Use: Yes     Comment: rarely  . Drug Use: No  . Sexual Activity: Not on file   Other Topics Concern  . Not on file   Social History Narrative   Patient is married Myriam Jacobson) and lives at home with his wife.   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    Patient is left handed   Patient drinks 1-2 cups of coffee in the morning and 3-4 cups of tea daily.                 PHYSICAL EXAM  Filed Vitals:   02/08/16 1014  BP: 150/87  Pulse: 85  Height:  (1.753 m)  Weight: 208 lb 12.8 oz (94.711 kg)   Body mass index is 30.82 kg/(m^2). Generalized: Well developed, Mildly obese male in no acute distress  Head: normocephalic and atraumatic,.  Neck: Supple, no carotid bruits  Cardiac: Regular rate rhythm, no murmur  Musculoskeletal: Arthritic changes in hands  Neurological examination   Mentation: Alert oriented to time, place, history taking. Follows all commands speech and language fluent.  Cranial  nerve II-XII: Pupils were equal round reactive to light extraocular movements were full, visual field were full on confrontational test. Facial sensation and strength were normal. hearing was intact to finger rubbing  bilaterally. Uvula tongue midline. head turning and shoulder shrug were normal and symmetric.Tongue protrusion into cheek strength was normal. Motor: normal bulk and tone, full strength in the BUE, BLE, fine finger movements normal, no pronator drift. No focal weakness Sensory: Mildly decreased light touch and pinprick to mid shin bilaterally, vibration normal. Proprioception normal  Coordination: finger-nose-finger, heel-to-shin bilaterally, no dysmetria Reflexes: Brachioradialis 2/2, biceps 2/2, triceps 2/2, patellar 0/0, Achilles 1/1, plantar responses were flexor bilaterally. Gait and Station: Rising up from seated position without assistance, normal stance, moderate stride, good arm swing, smooth turning, able to perform tiptoe, and heel walking without difficulty. Tandem gait is mildly unsteady. No assistive device    DIAGNOSTIC DATA (LABS, IMAGING, TESTING) - I reviewed patient records, labs, notes, testing and imaging myself where available.  Lab Results  Component Value Date   WBC 6.7 05/31/2015   HGB 14.6 05/31/2015   HCT 41.5 05/31/2015   MCV 89.4 05/31/2015   PLT 138* 05/31/2015      Component Value Date/Time   NA 134* 10/06/2015 0857   K 4.2 10/06/2015 0857   CL 99 10/06/2015 0857   CO2 27 10/06/2015 0857   GLUCOSE 67 10/06/2015 0857   BUN 14 10/06/2015 0857   CREATININE 1.13 10/06/2015 0857   CREATININE 1.12 06/01/2015 2038   CALCIUM 8.9 10/06/2015 0857   PROT 6.8 10/06/2015 0857   ALBUMIN 3.7 10/06/2015 0857   AST 22 10/06/2015 0857   ALT 17 10/06/2015 0857   ALKPHOS 70 10/06/2015 0857   BILITOT 0.5 10/06/2015 0857   GFRNONAA >60 06/01/2015 2038   GFRAA >60 06/01/2015 2038   Lab Results  Component Value Date   CHOL 170 06/01/2015   HDL 58  06/01/2015   LDLCALC 103* 06/01/2015   TRIG 46 06/01/2015   CHOLHDL 2.9 06/01/2015   Lab Results  Component Value Date   HGBA1C 5.7* 06/01/2015   No results found for: [id number] Lab Results  Component Value Date   TSH 2.265 06/01/2015      ASSESSMENT AND PLAN 75 y.o. year old male has a past medical history of restless leg syndrome Occlusion and stenosis of carotid artery without mention of cerebral infarction; Obstructive chronic bronchitis with exacerbation; Other emphysema; Mixed hyperlipidemia; Persistent disorder of initiating or maintaining sleep; Sleep apnea. and increased paresthesias in both feet bilaterally. Recent left hemispheric TIA on 05/31/15. No further stroke or TIA symptoms.The patient is a current patient of Dr. Pearlean Brownie  who is out of the office today . This note is sent to the work in doctor.     Continue aspirin for secondary stroke prevention Blood pressure with systolic 130/90 or below today's reading 150/87 LDL goal below 100 remain on Lipitor Exercise regularly, healthy diet Continue Mirapex at current dose for restless legs Try gabapentin 100 mg 3 times daily for burning feet patient already has Rx at home Follow-up with me in 6 months Nilda Riggs, [facility], [facility], APRN  [facility] [address], [address] [place], [place] [address] [phone]   Personally participated in and made any corrections needed to history, physical, neuro exam,assessment and plan as stated above.  I have personally obtained the history, evaluated lab date, reviewed imaging studies and agree with radiology interpretations.    Naomie Dean, MD [facility]

## 2016-02-08 NOTE — Patient Instructions (Signed)
Continue aspirin for secondary stroke prevention Blood pressure with systolic 130/90 or below today's reading 150/87 LDL goal below 100 remain on Lipitor Exercise regularly, healthy diet Continue Mirapex at current dose for restless legs Follow-up with me in 6 months

## 2016-02-13 ENCOUNTER — Telehealth: Payer: Self-pay | Admitting: Pulmonary Disease

## 2016-02-13 NOTE — Telephone Encounter (Signed)
Given the lack of response to all inhaled medicines I don't have another to recommend right now.  Would have him keep the appointment for the PFT.

## 2016-02-13 NOTE — Telephone Encounter (Signed)
LMTCB  Patient Instructions     Take Stiolto 2 puffs daily, no matter how you feel We will order a lung function test We will see you back in 3-4 weeks to discuss this result    PFT appt: [facility] 02-16-16 OV: appt with TP 03-06-16

## 2016-02-13 NOTE — Telephone Encounter (Signed)
Spoke with the pt and notified of recs per BQ  He verbalized understanding and nothing further needed

## 2016-02-13 NOTE — Telephone Encounter (Signed)
Spoke with the pt  He states that his Stiolto has not made any difference in his breathing  Breathing is no worse  [facility]  Please advise thanks

## 2016-02-13 NOTE — Telephone Encounter (Signed)
Pt returning a call [phone]

## 2016-02-16 ENCOUNTER — Ambulatory Visit (HOSPITAL_COMMUNITY)
Admission: RE | Admit: 2016-02-16 | Discharge: 2016-02-16 | Disposition: A | Discharge status: Home / Self Care | Payer: Medicare Other | Source: Ambulatory Visit | Attending: Pulmonary Disease | Admitting: Pulmonary Disease

## 2016-02-16 ENCOUNTER — Ambulatory Visit: Payer: Medicare Other | Admitting: Adult Health

## 2016-02-16 DIAGNOSIS — J849 Interstitial pulmonary disease, unspecified: Secondary | ICD-10-CM | POA: Diagnosis not present

## 2016-02-16 LAB — PULMONARY FUNCTION TEST
DL/VA % PRED: 54 %
DL/VA: 2.4 ml/min/mmHg/L
DLCO UNC: 12.76 ml/min/mmHg
DLCO unc % pred: 45 %
FEF 25-75 POST: 3.2 L/s
FEF 25-75 Pre: 2.77 L/sec
FEF2575-%Change-Post: 15 %
FEF2575-%PRED-PRE: 143 %
FEF2575-%Pred-Post: 165 %
FEV1-%Change-Post: 4 %
FEV1-%PRED-PRE: 106 %
FEV1-%Pred-Post: 111 %
FEV1-Post: 2.99 L
FEV1-Pre: 2.86 L
FEV1FVC-%Change-Post: 1 %
FEV1FVC-%Pred-Pre: 108 %
FEV6-%CHANGE-POST: 4 %
FEV6-%PRED-PRE: 104 %
FEV6-%Pred-Post: 109 %
FEV6-POST: 3.8 L
FEV6-Pre: 3.64 L
FEV6FVC-%PRED-POST: 107 %
FEV6FVC-%Pred-Pre: 107 %
FVC-%Change-Post: 3 %
FVC-%PRED-PRE: 98 %
FVC-%Pred-Post: 102 %
FVC-POST: 3.8 L
FVC-Pre: 3.67 L
POST FEV6/FVC RATIO: 100 %
PRE FEV1/FVC RATIO: 78 %
Post FEV1/FVC ratio: 79 %
Pre FEV6/FVC Ratio: 100 %
RV % pred: 71 %
RV: 1.73 L
TLC % PRED: 85 %
TLC: 5.53 L

## 2016-02-16 MED ORDER — ALBUTEROL SULFATE (2.5 MG/3ML) 0.083% IN NEBU
2.5000 mg | INHALATION_SOLUTION | Freq: Once | RESPIRATORY_TRACT | Status: AC
  Administered 2016-02-16: 2.5 mg via RESPIRATORY_TRACT

## 2016-02-26 ENCOUNTER — Telehealth: Payer: Self-pay | Admitting: Adult Health

## 2016-02-26 NOTE — Telephone Encounter (Signed)
Jeffrey Cobb,     Please let him know that his lung function testing is slightly worse compared to a year ago. This concerns me. I would like for him to have a 6 minute walk test before he sees me next. Please confirm that he has a visit with me in the next 1-2 months.    Thanks,    [employer] MutualBrent    Spoke with pt, aware of recs.  6mw and rov scheduled for next month.  Nothing further needed.

## 2016-02-28 ENCOUNTER — Ambulatory Visit: Payer: Medicare Other | Admitting: Neurology

## 2016-03-06 ENCOUNTER — Ambulatory Visit: Payer: Medicare Other | Admitting: Adult Health

## 2016-03-12 ENCOUNTER — Ambulatory Visit: Payer: Medicare Other | Admitting: Neurology

## 2016-04-23 ENCOUNTER — Ambulatory Visit (INDEPENDENT_AMBULATORY_CARE_PROVIDER_SITE_OTHER): Payer: Medicare Other | Admitting: Pulmonary Disease

## 2016-04-23 ENCOUNTER — Encounter: Payer: Self-pay | Admitting: Pulmonary Disease

## 2016-04-23 ENCOUNTER — Encounter: Payer: Self-pay | Admitting: Physician Assistant

## 2016-04-23 VITALS — BP 138/86 | HR 76 | Ht 69.0 in | Wt 210.4 lb

## 2016-04-23 DIAGNOSIS — J410 Simple chronic bronchitis: Secondary | ICD-10-CM

## 2016-04-23 DIAGNOSIS — R059 Cough, unspecified: Secondary | ICD-10-CM

## 2016-04-23 DIAGNOSIS — R05 Cough: Secondary | ICD-10-CM | POA: Diagnosis not present

## 2016-04-23 DIAGNOSIS — R0609 Other forms of dyspnea: Secondary | ICD-10-CM

## 2016-04-23 DIAGNOSIS — J849 Interstitial pulmonary disease, unspecified: Secondary | ICD-10-CM

## 2016-04-23 NOTE — Patient Instructions (Signed)
I am going to refer you to gastroenterology for a 24-hour pH probe If that shows significant acid reflux then we may need you to undergo a surgery called a Nissen fundoplication Follow-up 6-8 weeks

## 2016-04-23 NOTE — Progress Notes (Signed)
Subjective:    Patient ID: Jeffrey Cobb, male    DOB: Sep 07, 1940, 75 y.o.   MRN: [id number]  Synopsis: COPD and chronic cough, ?pulmonary fibrosis PFT"s 2009:  FEV1 3.49 (115%), ratio 65, DLCO 67%. No change with maintenance meds.  PFT's 08/2014:  FEV1 3.13 (114%), ratio 69, decreased airtrapping from 2009, DLCO 49% 2016 CT Chest showed ?early UIP and pulmonary nodules felt to be inflammatory Quit smoking in 1979 after 16 years of smoking 2 ppd Failed Gabapentin for cough in 2016 due to dizziness Failed Symbicort and Spiriva in that he felt like they really didn't help symptoms 05/2015 CT chest worrisome for UIP 01/2016 HRCT > traction bronchiolectasis and interlobular septal thickening in a basilar predominance have progressed compared to July 2016, there remains no frank honeycombing. There are multiple pulmonary nodules which are stable or have resolved. June 2017 pulmonary function testing FEV1 2.99 L (111% predicted, FVC 3.67 L (98% predicted), total lung capacity 5.53 L (85% predicted), DLCO 12.76 (45% predicted). August 2016 6 minute walk test 336 m O2 sats ration 92% on room air  HPI Chief Complaint  Patient presents with  . Follow-up    Pt here after 6MWT. Pt states his breathing is at baseline. Pt c/o cough with intermittent mucus production clear in color. Pt denies CP/tghtness and f/c/s.    Claus said he took the [employer] but it didn't really help much, just like any other inhaler he has taken.  Since the last visit he continues to have dyspnea with exertion, albuterol helps some when he takes it.  His dyspnea occurs when he walks on level ground, such as walking through a grocery store.    He continues to struggle with cough.  He says in general however his symptoms have improved since the last.      Past Medical History:  Diagnosis Date  . Acute sinusitis, unspecified   . Arthritis   . GERD (gastroesophageal reflux disease)   . Mixed hyperlipidemia   .  Obstructive chronic bronchitis with exacerbation (HCC)   . Occlusion and stenosis of carotid artery without mention of cerebral infarction    a. 05/2013 Carotid U/S: 1-39% bilat stenosis.  . Other emphysema (HCC)   . Persistent disorder of initiating or maintaining sleep   . Sleep apnea    a. on CPAP.  Marland Kitchen. Stroke [facility](HCC)       Review of Systems  Constitutional: Negative for chills, fatigue and fever.  HENT: Positive for rhinorrhea. Negative for sinus pressure and sneezing.   Respiratory: Positive for cough. Negative for shortness of breath and wheezing.   Cardiovascular: Negative for chest pain, palpitations and leg swelling.       Objective:   Physical Exam Vitals:   04/23/16 1148  BP: 138/86  Pulse: 76  SpO2: 96%  Weight: 210 lb 6.4 oz (95.4 kg)  Height: 5\' 9"  (1.753 m)   RA  Gen: well appearing HENT: OP clear, TM's clear, neck supple PULM: Crackles bases, normal percussion CV: RRR, no mgr, trace edema GI: BS+, soft, nontender Derm: no cyanosis or rash Psyche: foul mood but not overly emotional  HRCT from 01/2016 reviewed> traction bronchiolectasis and interlobular septal thickening in a basilar predominance have progressed compared to July 2016, there remains no frank honeycombing. There are multiple pulmonary nodules which are stable or have resolved.     Assessment & Plan:  Interstitial lung disease (HCC) I reviewed the images from his June 2017 high-resolution CT scan of the  chest as well as his lung function testing from June 2016. He does have interstitial lung disease, but the findings are not consistent with usual interstitial pneumonitis. Further, the progression of his pulmonary function test abnormalities has been slow since the original test in 2009. Therefore based on these findings I question a diagnosis of usual interstitial pneumonitis. In fact, I'm much more concerned about the possibility of acid reflux related to chronic aspiration causing his cough and  mild pulmonary fibrosis. The bronchiectasis on his CT chest would be more consistent with this.  Plan: I'm going to refer him to gastroenterology for a 24-hour pH probe in his esophagus If that is positive, then he may benefit from a Nissen fundoplication Follow-up 6-8 weeks  Greater than 50% of this 27 minute visit spent face-to-face updating he and his wife regarding this plan and reviewing images from his CT chest  COPD (chronic obstructive pulmonary disease) (HCC) He has airflow obstruction which is mild at best, his dyspnea is not improved with controller bronchodilators, so I see no reason for prescribing this continually. > 25 minutes spent face to face, > 50 minutes total on this visit including reviewing images and records on his case   Current Outpatient Prescriptions:  .  aspirin 81 MG tablet, Take 81 mg by mouth daily., Disp: , Rfl:  .  atorvastatin (LIPITOR) 40 MG tablet, Take 1 tablet (40 mg total) by mouth daily at 6 PM., Disp: 30 tablet, Rfl: 0 .  doxazosin (CARDURA) 2 MG tablet, Take 2 mg by mouth daily. , Disp: , Rfl:  .  meloxicam (MOBIC) 15 MG tablet, Take 1 tablet by mouth daily., Disp: , Rfl:  .  pramipexole (MIRAPEX) 0.5 MG tablet, TAKE 1 TABLET BY MOUTH EVERY MORNING AND TAKE 1 TABLET EVERY EVENING, Disp: 180 tablet, Rfl: 2 .  PROAIR HFA 108 (90 BASE) MCG/ACT inhaler, Inhale 2 puffs into the lungs every 6 (six) hours as needed., Disp: , Rfl:

## 2016-04-23 NOTE — Assessment & Plan Note (Signed)
He has airflow obstruction which is mild at best, his dyspnea is not improved with controller bronchodilators, so I see no reason for prescribing this continually.

## 2016-04-23 NOTE — Addendum Note (Signed)
Addended by: Sheran LuzEAST, Fatisha Rabalais K on: 04/23/2016 12:38 PM   Modules accepted: Orders

## 2016-04-23 NOTE — Assessment & Plan Note (Signed)
I reviewed the images from his June 2017 high-resolution CT scan of the chest as well as his lung function testing from June 2016. He does have interstitial lung disease, but the findings are not consistent with usual interstitial pneumonitis. Further, the progression of his pulmonary function test abnormalities has been slow since the original test in 2009. Therefore based on these findings I question a diagnosis of usual interstitial pneumonitis. In fact, I'm much more concerned about the possibility of acid reflux related to chronic aspiration causing his cough and mild pulmonary fibrosis. The bronchiectasis on his CT chest would be more consistent with this.  Plan: I'm going to refer him to gastroenterology for a 24-hour pH probe in his esophagus If that is positive, then he may benefit from a Nissen fundoplication Follow-up 6-8 weeks  Greater than 50% of this 27 minute visit spent face-to-face updating he and his wife regarding this plan and reviewing images from his CT chest

## 2016-04-24 ENCOUNTER — Ambulatory Visit: Payer: Medicare Other | Admitting: Nurse Practitioner

## 2016-05-01 ENCOUNTER — Ambulatory Visit (INDEPENDENT_AMBULATORY_CARE_PROVIDER_SITE_OTHER): Payer: Medicare Other | Admitting: Physician Assistant

## 2016-05-01 ENCOUNTER — Encounter: Payer: Self-pay | Admitting: Physician Assistant

## 2016-05-01 ENCOUNTER — Encounter (HOSPITAL_COMMUNITY): Payer: Self-pay | Admitting: *Deleted

## 2016-05-01 VITALS — BP 128/74 | HR 72 | Ht 69.0 in | Wt 213.2 lb

## 2016-05-01 DIAGNOSIS — R05 Cough: Secondary | ICD-10-CM

## 2016-05-01 DIAGNOSIS — R053 Chronic cough: Secondary | ICD-10-CM

## 2016-05-01 DIAGNOSIS — Z8619 Personal history of other infectious and parasitic diseases: Secondary | ICD-10-CM

## 2016-05-01 DIAGNOSIS — Z8711 Personal history of peptic ulcer disease: Secondary | ICD-10-CM | POA: Diagnosis not present

## 2016-05-01 NOTE — Progress Notes (Signed)
Chief Complaint: Chronic Cough  HPI:  [name] is a 75 y/o caucasian male who was referred to me by Dr. Kendrick FriesMcQuaid, for a complaint of chronic cough. He was recently seen at [facility] for follow-up of his COPD and chronic cough with question of pulmonary fibrosis.  At that time a CT from June 2017 was reviewed as well as lung function testing from June 2016 and it was noted the patient did have interstitial lung disease, but the findings were not consistent with usual interstitial pneumonitis. It was thought that the progression of his pulmonary function test abnormalities had been slow since the original test in 2009. Therefore his pulmonologist questioned the diagnosis of usual interstitial pneumonitis and described concern about the possibility of acid reflux related to chronic aspiration causing his cough and mild pulmonary fibrosis. The bronchiectasis on his CT chest would be more consistent with this. The patient was then referred to our office for a pH probe study.   Today, the patient presented to clinic accompanied by his wife and tells me that since the age of 75 years old he battled reflux and was constantly eating Tums and Rolaids, but then in 1991 he was admitted for a bleeding gastric ulcer and treated for a finding of H. pylori, since that time the patient has had no further overt reflux symptoms, although he had remained on pantoprazole since that time until recently. Patient tells me he went to see his ENT who switched him from pantoprazole to ranitidine and then took him off of his medicine altogether "a few months ago". He explains that he had an ENT evaluation which showed some "scarring in his larynx", but no findings of active reflux. The patient denies any symptoms of feeling heartburn or reflux, but does complain of a chronic cough which he has been battling for 3-4 years. He tells me that this can, "come on out of the blue and sometimes lasts 45 minutes". This cough is not  necessarily worse at any time of the day and is not exacerbated by anything in particular that the patient can tell. He is here by request of his pulmonologist for further evaluation of possible ongoing reflux symptoms.  Patient denies fever, chills, dysphagia, nausea, vomiting, abdominal pain, change in bowel habits, blood in his stool, weight loss or anorexia.   Past Medical History:  Diagnosis Date  . Acute sinusitis, unspecified   . Arthritis   . GERD (gastroesophageal reflux disease)   . Mixed hyperlipidemia   . Obstructive chronic bronchitis with exacerbation (HCC)   . Occlusion and stenosis of carotid artery without mention of cerebral infarction    a. 05/2013 Carotid U/S: 1-39% bilat stenosis.  . Other emphysema (HCC)   . Persistent disorder of initiating or maintaining sleep   . Sleep apnea    a. on CPAP.  Marland Kitchen. Stroke [facility](HCC)     Past Surgical History:  Procedure Laterality Date  . APPENDECTOMY  1956  . EYE SURGERY Bilateral    cataract removal  . JOINT REPLACEMENT Right    shoulder replacement  . LEFT KNEE REPLACEMENT  2001  . RIGHT ANKLE RECONSTRUCTION    . RIGHT KNEE REPLACEMENT  2005  . RUPTURED DISK  2000  . SPINE SURGERY  2000/2011  . torn retina    . TOTAL SHOULDER ARTHROPLASTY Left 06/30/2013   Procedure: TOTAL SHOULDER ARTHROPLASTY;  Surgeon: Loreta Aveaniel F Murphy, MD;  [facility] OR;  Service: Orthopedics;  Laterality: Left;    Current Outpatient  Prescriptions  Medication Sig Dispense Refill  . aspirin 81 MG tablet Take 81 mg by mouth daily.    Marland Kitchen atorvastatin (LIPITOR) 40 MG tablet Take 1 tablet (40 mg total) by mouth daily at 6 PM. 30 tablet 0  . doxazosin (CARDURA) 2 MG tablet Take 2 mg by mouth daily.     . meloxicam (MOBIC) 15 MG tablet Take 1 tablet by mouth daily.    Marland Kitchen PROAIR HFA 108 (90 BASE) MCG/ACT inhaler Inhale 2 puffs into the lungs every 6 (six) hours as needed.    . pramipexole (MIRAPEX) 0.5 MG tablet TAKE 1 TABLET BY MOUTH EVERY MORNING AND TAKE 1  TABLET EVERY EVENING 180 tablet 2   No current facility-administered medications for this visit.     Allergies as of 05/01/2016 - Review Complete 05/01/2016  Allergen Reaction Noted  . Gabapentin  04/25/2015  . Sulfonamide derivatives Rash   . Trazodone and nefazodone Rash and Other (See Comments) 11/16/2010    Family History  Problem Relation Age of Onset  . Emphysema Mother   . Heart disease Mother   . Alzheimer's disease Mother   . Heart disease Father   . Stroke Paternal Grandfather   . Allergies      FH: FATHER,2 SISTER,1 BROTHER, SON  DAUGHTER  . Cancer Sister     Social History   Social History  . Marital status: Married    Spouse name: Myriam Jacobson   . Number of children: 2  . Years of education: Assoc    Occupational History  . Retired Retired   Social History Main Topics  . Smoking status: Former Smoker    Packs/day: 3.00    Years: 17.00    Types: Cigarettes    Quit date: 08/26/1977  . Smokeless tobacco: Never Used  . Alcohol use Yes     Comment: rarely  . Drug use: No  . Sexual activity: Not on file   Other Topics Concern  . Not on file   Social History Narrative   Patient is married Myriam Jacobson) and lives at home with his wife.   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    Patient is left handed   Patient drinks 1-2 cups of coffee in the morning and 3-4 cups of tea daily.                Review of Systems:    Constitutional: No weight loss, fever, chills, weakness or fatigue HEENT: Eyes: No Change in vision               Ears, Nose, Throat:  No change in hearing or congestion Skin: No rash or itching Cardiovascular: No chest pain, chest pressure or palpitations  Respiratory: Positive for dyspnea on exertion and cough  Gastrointestinal: See HPI and otherwise negative Genitourinary: No dysuria or change in urinary frequency Neurological: No headache, dizziness or syncope Musculoskeletal: No new muscle or joint  pain Hematologic: No bleeding or bruising Psychiatric: No history of depression or anxiety   Physical Exam:  Vital signs: BP 128/74 (BP Location: Left Arm, Patient Position: Sitting)   Pulse 72   Ht 5\' 9"  (1.753 m)   Wt 213 lb 3.2 oz (96.7 kg)   BMI 31.48 kg/m   General:   Pleasant Caucasian male appears to be in NAD, Well developed, Well nourished, alert and cooperative Head:  Normocephalic and atraumatic. Eyes:   PEERL, EOMI. No icterus. Conjunctiva pink. Ears:  Normal auditory acuity. Neck:  Supple Throat: Oral cavity and pharynx without inflammation, swelling or lesion.  Lungs: Respirations even and unlabored. Lungs clear to auscultation bilaterally.   No wheezes, crackles, or rhonchi.  Heart: Normal S1, S2. No MRG. Regular rate and rhythm. No peripheral edema, cyanosis or pallor.  Abdomen:  Soft, nondistended, nontender. No rebound or guarding. Normal bowel sounds. No appreciable masses or hepatomegaly. Rectal:  Not performed.  Msk:  Symmetrical without gross deformities. Peripheral pulses intact.  Extremities:  Without edema, no deformity or joint abnormality.  Neurologic:  Alert and  oriented x4;  grossly normal neurologically.  Skin:   Dry and intact without significant lesions or rashes. Psychiatric: Oriented to person, place and time. Demonstrates good judgement and reason without abnormal affect or behaviors.  RELEVANT LABS AND IMAGING: CBC    Component Value Date/Time   WBC 6.7 05/31/2015 1730   RBC 4.64 05/31/2015 1730   HGB 14.6 05/31/2015 1730   HCT 41.5 05/31/2015 1730   PLT 138 (L) 05/31/2015 1730   MCV 89.4 05/31/2015 1730   MCH 31.5 05/31/2015 1730   MCHC 35.2 05/31/2015 1730   RDW 13.4 05/31/2015 1730   LYMPHSABS 2.2 09/02/2014 1204   MONOABS 0.7 09/02/2014 1204   EOSABS 0.2 09/02/2014 1204   BASOSABS 0.0 09/02/2014 1204    CMP     Component Value Date/Time   NA 134 (L) 10/06/2015 0857   K 4.2 10/06/2015 0857   CL 99 10/06/2015 0857   CO2 27  10/06/2015 0857   GLUCOSE 67 10/06/2015 0857   BUN 14 10/06/2015 0857   CREATININE 1.13 10/06/2015 0857   CALCIUM 8.9 10/06/2015 0857   PROT 6.8 10/06/2015 0857   ALBUMIN 3.7 10/06/2015 0857   AST 22 10/06/2015 0857   ALT 17 10/06/2015 0857   ALKPHOS 70 10/06/2015 0857   BILITOT 0.5 10/06/2015 0857   GFRNONAA >60 06/01/2015 2038   GFRAA >60 06/01/2015 2038    Assessment: 1. Chronic cough: Patient reports cough for the past 3-4 years which was initially thought due to pulmonary disease, after recent CT and lung function testing, pulmonologist believes this may be related to chronic reflux symptoms 2. History of H. pylori and peptic ulcer disease: Patient reports history of H. pylori and peptic ulcer disease, ulcer diagnosed in 1991, no recent sensation of typical heartburn or reflux  Plan: 1. Reviewed case with Dr. Christella Hartigan at time of patient visit. Will schedule patient for EGD and Bravo placement for 48 hour pH study. Patient should abstain from all antireflux medications at this time, until after further testing. 2. Requested patient's last colonoscopy from [facility], this was done 5 years ago 3. Patient to follow with Dr. Christella Hartigan as recommended in the future.  Hyacinth Meeker, PA-C [facility] 05/01/2016, 9:43 AM  Cc: Gildardo Cranker, MD         Dr. Kendrick Fries

## 2016-05-01 NOTE — Patient Instructions (Signed)
You have been scheduled for an endoscopy. Please follow written instructions given to you at your visit today. If you use inhalers (even only as needed), please bring them with you on the day of your procedure.  They will schedule you for a follow up with Dr. Christella HartiganJacobs after your procedure.

## 2016-05-01 NOTE — Progress Notes (Signed)
I agree with the above note, plan 

## 2016-05-02 ENCOUNTER — Ambulatory Visit (HOSPITAL_COMMUNITY): Payer: Medicare Other | Admitting: Registered Nurse

## 2016-05-02 ENCOUNTER — Ambulatory Visit (HOSPITAL_COMMUNITY)
Admission: RE | Admit: 2016-05-02 | Discharge: 2016-05-02 | Disposition: A | Discharge status: Home / Self Care | Payer: Medicare Other | Source: Ambulatory Visit | Attending: Gastroenterology | Admitting: Gastroenterology

## 2016-05-02 ENCOUNTER — Encounter (HOSPITAL_COMMUNITY): Payer: Self-pay

## 2016-05-02 ENCOUNTER — Encounter (HOSPITAL_COMMUNITY)
Admission: RE | Disposition: A | Discharge status: Home / Self Care | Payer: Self-pay | Source: Ambulatory Visit | Attending: Gastroenterology

## 2016-05-02 DIAGNOSIS — Z79899 Other long term (current) drug therapy: Secondary | ICD-10-CM | POA: Diagnosis not present

## 2016-05-02 DIAGNOSIS — G4730 Sleep apnea, unspecified: Secondary | ICD-10-CM | POA: Diagnosis not present

## 2016-05-02 DIAGNOSIS — E782 Mixed hyperlipidemia: Secondary | ICD-10-CM | POA: Insufficient documentation

## 2016-05-02 DIAGNOSIS — K449 Diaphragmatic hernia without obstruction or gangrene: Secondary | ICD-10-CM | POA: Insufficient documentation

## 2016-05-02 DIAGNOSIS — Z96611 Presence of right artificial shoulder joint: Secondary | ICD-10-CM | POA: Diagnosis not present

## 2016-05-02 DIAGNOSIS — R05 Cough: Secondary | ICD-10-CM | POA: Insufficient documentation

## 2016-05-02 DIAGNOSIS — E669 Obesity, unspecified: Secondary | ICD-10-CM | POA: Diagnosis not present

## 2016-05-02 DIAGNOSIS — M1990 Unspecified osteoarthritis, unspecified site: Secondary | ICD-10-CM | POA: Diagnosis not present

## 2016-05-02 DIAGNOSIS — R12 Heartburn: Secondary | ICD-10-CM | POA: Diagnosis not present

## 2016-05-02 DIAGNOSIS — Z96653 Presence of artificial knee joint, bilateral: Secondary | ICD-10-CM | POA: Diagnosis not present

## 2016-05-02 DIAGNOSIS — I739 Peripheral vascular disease, unspecified: Secondary | ICD-10-CM | POA: Diagnosis not present

## 2016-05-02 DIAGNOSIS — Z9989 Dependence on other enabling machines and devices: Secondary | ICD-10-CM | POA: Diagnosis not present

## 2016-05-02 DIAGNOSIS — Z791 Long term (current) use of non-steroidal anti-inflammatories (NSAID): Secondary | ICD-10-CM | POA: Diagnosis not present

## 2016-05-02 DIAGNOSIS — Z7982 Long term (current) use of aspirin: Secondary | ICD-10-CM | POA: Diagnosis not present

## 2016-05-02 DIAGNOSIS — Z96612 Presence of left artificial shoulder joint: Secondary | ICD-10-CM | POA: Diagnosis not present

## 2016-05-02 DIAGNOSIS — Z8673 Personal history of transient ischemic attack (TIA), and cerebral infarction without residual deficits: Secondary | ICD-10-CM | POA: Insufficient documentation

## 2016-05-02 DIAGNOSIS — J449 Chronic obstructive pulmonary disease, unspecified: Secondary | ICD-10-CM | POA: Insufficient documentation

## 2016-05-02 DIAGNOSIS — Z87891 Personal history of nicotine dependence: Secondary | ICD-10-CM | POA: Diagnosis not present

## 2016-05-02 DIAGNOSIS — Z6831 Body mass index (BMI) 31.0-31.9, adult: Secondary | ICD-10-CM | POA: Insufficient documentation

## 2016-05-02 DIAGNOSIS — R053 Chronic cough: Secondary | ICD-10-CM

## 2016-05-02 HISTORY — DX: Left anterior fascicular block: I44.4

## 2016-05-02 HISTORY — PX: ESOPHAGOGASTRODUODENOSCOPY (EGD) WITH PROPOFOL: SHX5813

## 2016-05-02 HISTORY — PX: BRAVO PH STUDY: SHX5421

## 2016-05-02 SURGERY — ESOPHAGOGASTRODUODENOSCOPY (EGD) WITH PROPOFOL
Anesthesia: Monitor Anesthesia Care

## 2016-05-02 MED ORDER — SODIUM CHLORIDE 0.9 % IV SOLN: INTRAVENOUS | Status: DC

## 2016-05-02 MED ORDER — LIDOCAINE 2% (20 MG/ML) 5 ML SYRINGE
INTRAMUSCULAR | Status: DC | PRN
  Administered 2016-05-02: 100 mg via INTRAVENOUS

## 2016-05-02 MED ORDER — LACTATED RINGERS IV SOLN
INTRAVENOUS | Status: DC
  Administered 2016-05-02: 11:00:00 via INTRAVENOUS

## 2016-05-02 MED ORDER — LIDOCAINE 2% (20 MG/ML) 5 ML SYRINGE
INTRAMUSCULAR | Status: AC
  Filled 2016-05-02: qty 5

## 2016-05-02 MED ORDER — PROPOFOL 500 MG/50ML IV EMUL
INTRAVENOUS | Status: DC | PRN
  Administered 2016-05-02: 120 ug/kg/min via INTRAVENOUS

## 2016-05-02 MED ORDER — PROPOFOL 10 MG/ML IV BOLUS
INTRAVENOUS | Status: AC
  Filled 2016-05-02: qty 40

## 2016-05-02 MED ORDER — PROPOFOL 10 MG/ML IV BOLUS
INTRAVENOUS | Status: DC | PRN
  Administered 2016-05-02 (×2): 20 mg via INTRAVENOUS

## 2016-05-02 SURGICAL SUPPLY — 15 items

## 2016-05-02 NOTE — Op Note (Signed)
[facility] Patient Name: Jeffrey Cobb Procedure Date: 05/02/2016 MRN: [id number] Attending MD: Rachael Fee , MD Date of Birth: 1941-05-03 CSN: [id number] Age: 75 Admit Type: Outpatient Procedure:                Upper GI endoscopy Indications:              Chronic cough, no classic GERD symptoms. Currently                            not on any antiacid medicines. Cough not better                            when he was on antiacid medicines. Providers:                Rachael Fee, MD, Tomma Rakers, RN, Clearnce Sorrel, Technician, Lauree Chandler Armistead, CRNA Referring MD:             Heber LaCoste, MD Medicines:                Monitored Anesthesia Care Complications:            No immediate complications. Estimated blood loss:                            None. Estimated Blood Loss:     Estimated blood loss: none. Procedure:                Pre-Anesthesia Assessment:                           - Prior to the procedure, a History and Physical                            was performed, and patient medications and                            allergies were reviewed. The patient's tolerance of                            previous anesthesia was also reviewed. The risks                            and benefits of the procedure and the sedation                            options and risks were discussed with the patient.                            All questions were answered, and informed consent                            was obtained. Prior Anticoagulants: The patient has  taken no previous anticoagulant or antiplatelet                            agents. ASA Grade Assessment: II - A patient with                            mild systemic disease. After reviewing the risks                            and benefits, the patient was deemed in                            satisfactory condition to undergo the procedure.              After obtaining informed consent, the endoscope was                            passed under direct vision. Throughout the                            procedure, the patient's blood pressure, pulse, and                            oxygen saturations were monitored continuously. The                            [id number]) scope was introduced through the                            mouth, and advanced to the second part of duodenum.                            The upper GI endoscopy was accomplished without                            difficulty. The patient tolerated the procedure                            well. Scope In: Scope Out: Findings:      A small hiatal hernia was present.      The exam was otherwise without abnormality. The GE junction was noted at       37.5cm from the incisors.      The BRAVO capsule with delivery system was introduced through the mouth       and advanced into the esophagus, such that the BRAVO pH capsule was       positioned appropriately (6cm above the GE junction). The BRAVO pH       capsule was then deployed and attached to the esophageal mucosa. The       delivery system was then withdrawn. Endoscopy was utilized for probe       placement and diagnostic evaluation. Probe placement was verified       endoscopically after placement. Impression:               - Small hiatal hernia.                           -  The examination was otherwise normal.                           - The BRAVO pH capsule was deployed.                           - No specimens collected. Moderate Sedation:      N/A- Per Anesthesia Care Recommendation:           - Patient has a contact number available for                            emergencies. The signs and symptoms of potential                            delayed complications were discussed with the                            patient. Return to normal activities tomorrow.                            Written discharge  instructions were provided to the                            patient.                           - Resume previous diet.                           - Continue present medications.                           - No repeat upper endoscopy.                           - Dr. Christella Hartigan' office will contact you with results                            of the pH testing when they are available. Procedure Code(s):        --- Professional ---                           [phone], Esophagogastroduodenoscopy, flexible,                            transoral; diagnostic, including collection of                            specimen(s) by brushing or washing, when performed                            (separate procedure) Diagnosis Code(s):        --- Professional ---                           K44.9, Diaphragmatic hernia without  obstruction or                            gangrene                           R05, Cough CPT copyright 2016 American Medical Association. All rights reserved. The codes documented in this report are preliminary and upon coder review may  be revised to meet current compliance requirements. Rachael Fee, MD 05/02/2016 12:32:07 PM This report has been signed electronically. Number of Addenda: 0

## 2016-05-02 NOTE — Anesthesia Preprocedure Evaluation (Addendum)
Anesthesia Evaluation  Patient identified by MRN, date of birth, ID band Patient awake    Reviewed: Allergy & Precautions, NPO status , Patient's Chart, lab work & pertinent test results  History of Anesthesia Complications (+) history of anesthetic complications  Airway Mallampati: II  TM Distance: >3 FB Neck ROM: Full    Dental no notable dental hx.    Pulmonary shortness of breath, sleep apnea and Continuous Positive Airway Pressure Ventilation , pneumonia, resolved, COPD, former smoker,    Pulmonary exam normal breath sounds clear to auscultation       Cardiovascular + Peripheral Vascular Disease and + DOE  Normal cardiovascular exam+ dysrhythmias  Rhythm:Regular Rate:Normal     Neuro/Psych PSYCHIATRIC DISORDERS TIACVA    GI/Hepatic Neg liver ROS, GERD  ,  Endo/Other  negative endocrine ROS  Renal/GU negative Renal ROS  negative genitourinary   Musculoskeletal  (+) Arthritis ,   Abdominal (+) + obese,   Peds negative pediatric ROS (+)  Hematology negative hematology ROS (+)   Anesthesia Other Findings   Reproductive/Obstetrics negative OB ROS                             Anesthesia Physical Anesthesia Plan  ASA: II  Anesthesia Plan: MAC   Post-op Pain Management:    Induction: Intravenous  Airway Management Planned: Natural Airway  Additional Equipment:   Intra-op Plan:   Post-operative Plan:   Informed Consent: I have reviewed the patients History and Physical, chart, labs and discussed the procedure including the risks, benefits and alternatives for the proposed anesthesia with the patient or authorized representative who has indicated his/her understanding and acceptance.   Dental advisory given  Plan Discussed with: CRNA  Anesthesia Plan Comments:         Anesthesia Quick Evaluation

## 2016-05-02 NOTE — Interval H&P Note (Signed)
History and Physical Interval Note:  05/02/2016 11:58 AM  Jeffrey Cobb  has presented today for surgery, with the diagnosis of chronic cough  The various methods of treatment have been discussed with the patient and family. After consideration of risks, benefits and other options for treatment, the patient has consented to  Procedure(s): ESOPHAGOGASTRODUODENOSCOPY (EGD) WITH PROPOFOL (N/A) BRAVO PH STUDY (N/A) as a surgical intervention .  The patient's history has been reviewed, patient examined, no change in status, stable for surgery.  I have reviewed the patient's chart and labs.  Questions were answered to the patient's satisfaction.     Rachael FeeJacobs, Daniel P

## 2016-05-02 NOTE — Discharge Instructions (Signed)
YOU HAD AN ENDOSCOPIC PROCEDURE TODAY: Refer to the procedure report and other information in the discharge instructions given to you for any specific questions about what was found during the examination. If this information does not answer your questions, please call [place] office at [phone] to clarify.  ° °YOU SHOULD EXPECT: Some feelings of bloating in the abdomen. Passage of more gas than usual. Walking can help get rid of the air that was put into your GI tract during the procedure and reduce the bloating. If you had a lower endoscopy (such as a colonoscopy or flexible sigmoidoscopy) you may notice spotting of blood in your stool or on the toilet paper. Some abdominal soreness may be present for a day or two, also. ° °DIET: Your first meal following the procedure should be a light meal and then it is ok to progress to your normal diet. A half-sandwich or bowl of soup is an example of a good first meal. Heavy or fried foods are harder to digest and may make you feel nauseous or bloated. Drink plenty of fluids but you should avoid alcoholic beverages for 24 hours. If you had a esophageal dilation, please see attached instructions for diet.   ° °ACTIVITY: Your care partner should take you home directly after the procedure. You should plan to take it easy, moving slowly for the rest of the day. You can resume normal activity the day after the procedure however YOU SHOULD NOT DRIVE, use power tools, machinery or perform tasks that involve climbing or major physical exertion for 24 hours (because of the sedation medicines used during the test).  ° °SYMPTOMS TO REPORT IMMEDIATELY: °A gastroenterologist can be reached at any hour. Please call [phone]  for any of the following symptoms:  °Following lower endoscopy (colonoscopy, flexible sigmoidoscopy) °Excessive amounts of blood in the stool  °Significant tenderness, worsening of abdominal pains  °Swelling of the abdomen that is new, acute  °Fever of 100° or  higher  °Following upper endoscopy (EGD, EUS, ERCP, esophageal dilation) °Vomiting of blood or coffee ground material  °New, significant abdominal pain  °New, significant chest pain or pain under the shoulder blades  °Painful or persistently difficult swallowing  °New shortness of breath  °Black, tarry-looking or red, bloody stools ° °FOLLOW UP:  °If any biopsies were taken you will be contacted by phone or by letter within the next 1-3 weeks. Call [phone]  if you have not heard about the biopsies in 3 weeks.  °Please also call with any specific questions about appointments or follow up tests. ° °

## 2016-05-02 NOTE — Transfer of Care (Signed)
Immediate Anesthesia Transfer of Care Note  Patient: Jeffrey Cobb  Procedure(s) Performed: Procedure(s): ESOPHAGOGASTRODUODENOSCOPY (EGD) WITH PROPOFOL (N/A) BRAVO PH STUDY (N/A)  Patient Location: PACU and Endoscopy Unit  Anesthesia Type:MAC  Level of Consciousness: awake, alert , oriented and patient cooperative  Airway & Oxygen Therapy: Patient Spontanous Breathing and Patient connected to nasal cannula oxygen  Post-op Assessment: Report given to RN, Post -op Vital signs reviewed and stable and Patient moving all extremities  Post vital signs: Reviewed and stable  Last Vitals:  Vitals:   05/02/16 1106 05/02/16 1135  BP:  (!) 183/98  Pulse:  66  Resp:  10  Temp: 36.8 C 36.8 C    Last Pain:  Vitals:   05/02/16 1135  TempSrc: Oral         Complications: No apparent anesthesia complications

## 2016-05-02 NOTE — H&P (View-Only) (Signed)
Chief Complaint: Chronic Cough  HPI:  Mr. Juanda BondScarboro is a 75 y/o caucasian male who was referred to me by Dr. Kendrick FriesMcQuaid, for a complaint of chronic cough. He was recently seen at [facility] for follow-up of his COPD and chronic cough with question of pulmonary fibrosis.  At that time a CT from June 2017 was reviewed as well as lung function testing from June 2016 and it was noted the patient did have interstitial lung disease, but the findings were not consistent with usual interstitial pneumonitis. It was thought that the progression of his pulmonary function test abnormalities had been slow since the original test in 2009. Therefore his pulmonologist questioned the diagnosis of usual interstitial pneumonitis and described concern about the possibility of acid reflux related to chronic aspiration causing his cough and mild pulmonary fibrosis. The bronchiectasis on his CT chest would be more consistent with this. The patient was then referred to our office for a pH probe study.   Today, the patient presented to clinic accompanied by his wife and tells me that since the age of 75 years old he battled reflux and was constantly eating Tums and Rolaids, but then in 1991 he was admitted for a bleeding gastric ulcer and treated for a finding of H. pylori, since that time the patient has had no further overt reflux symptoms, although he had remained on pantoprazole since that time until recently. Patient tells me he went to see his ENT who switched him from pantoprazole to ranitidine and then took him off of his medicine altogether "a few months ago". He explains that he had an ENT evaluation which showed some "scarring in his larynx", but no findings of active reflux. The patient denies any symptoms of feeling heartburn or reflux, but does complain of a chronic cough which he has been battling for 3-4 years. He tells me that this can, "come on out of the blue and sometimes lasts 45 minutes". This cough is not  necessarily worse at any time of the day and is not exacerbated by anything in particular that the patient can tell. He is here by request of his pulmonologist for further evaluation of possible ongoing reflux symptoms.  Patient denies fever, chills, dysphagia, nausea, vomiting, abdominal pain, change in bowel habits, blood in his stool, weight loss or anorexia.   Past Medical History:  Diagnosis Date  . Acute sinusitis, unspecified   . Arthritis   . GERD (gastroesophageal reflux disease)   . Mixed hyperlipidemia   . Obstructive chronic bronchitis with exacerbation (HCC)   . Occlusion and stenosis of carotid artery without mention of cerebral infarction    a. 05/2013 Carotid U/S: 1-39% bilat stenosis.  . Other emphysema (HCC)   . Persistent disorder of initiating or maintaining sleep   . Sleep apnea    a. on CPAP.  Marland Kitchen. Stroke [facility](HCC)     Past Surgical History:  Procedure Laterality Date  . APPENDECTOMY  1956  . EYE SURGERY Bilateral    cataract removal  . JOINT REPLACEMENT Right    shoulder replacement  . LEFT KNEE REPLACEMENT  2001  . RIGHT ANKLE RECONSTRUCTION    . RIGHT KNEE REPLACEMENT  2005  . RUPTURED DISK  2000  . SPINE SURGERY  2000/2011  . torn retina    . TOTAL SHOULDER ARTHROPLASTY Left 06/30/2013   Procedure: TOTAL SHOULDER ARTHROPLASTY;  Surgeon: Loreta Aveaniel F Murphy, MD;  Location[facility] OR;  Service: Orthopedics;  Laterality: Left;    Current Outpatient  Prescriptions  Medication Sig Dispense Refill  . aspirin 81 MG tablet Take 81 mg by mouth daily.    Marland Kitchen atorvastatin (LIPITOR) 40 MG tablet Take 1 tablet (40 mg total) by mouth daily at 6 PM. 30 tablet 0  . doxazosin (CARDURA) 2 MG tablet Take 2 mg by mouth daily.     . meloxicam (MOBIC) 15 MG tablet Take 1 tablet by mouth daily.    Marland Kitchen PROAIR HFA 108 (90 BASE) MCG/ACT inhaler Inhale 2 puffs into the lungs every 6 (six) hours as needed.    . pramipexole (MIRAPEX) 0.5 MG tablet TAKE 1 TABLET BY MOUTH EVERY MORNING AND TAKE 1  TABLET EVERY EVENING 180 tablet 2   No current facility-administered medications for this visit.     Allergies as of 05/01/2016 - Review Complete 05/01/2016  Allergen Reaction Noted  . Gabapentin  04/25/2015  . Sulfonamide derivatives Rash   . Trazodone and nefazodone Rash and Other (See Comments) 11/16/2010    Family History  Problem Relation Age of Onset  . Emphysema Mother   . Heart disease Mother   . Alzheimer's disease Mother   . Heart disease Father   . Stroke Paternal Grandfather   . Allergies      FH: FATHER,2 SISTER,1 BROTHER, SON  DAUGHTER  . Cancer Sister     Social History   Social History  . Marital status: Married    Spouse name: Myriam Jacobson   . Number of children: 2  . Years of education: Assoc    Occupational History  . Retired Retired   Social History Main Topics  . Smoking status: Former Smoker    Packs/day: 3.00    Years: 17.00    Types: Cigarettes    Quit date: 08/26/1977  . Smokeless tobacco: Never Used  . Alcohol use Yes     Comment: rarely  . Drug use: No  . Sexual activity: Not on file   Other Topics Concern  . Not on file   Social History Narrative   Patient is married Myriam Jacobson) and lives at home with his wife.   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    Patient is left handed   Patient drinks 1-2 cups of coffee in the morning and 3-4 cups of tea daily.                Review of Systems:    Constitutional: No weight loss, fever, chills, weakness or fatigue HEENT: Eyes: No Change in vision               Ears, Nose, Throat:  No change in hearing or congestion Skin: No rash or itching Cardiovascular: No chest pain, chest pressure or palpitations  Respiratory: Positive for dyspnea on exertion and cough  Gastrointestinal: See HPI and otherwise negative Genitourinary: No dysuria or change in urinary frequency Neurological: No headache, dizziness or syncope Musculoskeletal: No new muscle or joint  pain Hematologic: No bleeding or bruising Psychiatric: No history of depression or anxiety   Physical Exam:  Vital signs: BP 128/74 (BP Location: Left Arm, Patient Position: Sitting)   Pulse 72   Ht 5\' 9"  (1.753 m)   Wt 213 lb 3.2 oz (96.7 kg)   BMI 31.48 kg/m   General:   Pleasant Caucasian male appears to be in NAD, Well developed, Well nourished, alert and cooperative Head:  Normocephalic and atraumatic. Eyes:   PEERL, EOMI. No icterus. Conjunctiva pink. Ears:  Normal auditory acuity. Neck:  Supple Throat: Oral cavity and pharynx without inflammation, swelling or lesion.  Lungs: Respirations even and unlabored. Lungs clear to auscultation bilaterally.   No wheezes, crackles, or rhonchi.  Heart: Normal S1, S2. No MRG. Regular rate and rhythm. No peripheral edema, cyanosis or pallor.  Abdomen:  Soft, nondistended, nontender. No rebound or guarding. Normal bowel sounds. No appreciable masses or hepatomegaly. Rectal:  Not performed.  Msk:  Symmetrical without gross deformities. Peripheral pulses intact.  Extremities:  Without edema, no deformity or joint abnormality.  Neurologic:  Alert and  oriented x4;  grossly normal neurologically.  Skin:   Dry and intact without significant lesions or rashes. Psychiatric: Oriented to person, place and time. Demonstrates good judgement and reason without abnormal affect or behaviors.  RELEVANT LABS AND IMAGING: CBC    Component Value Date/Time   WBC 6.7 05/31/2015 1730   RBC 4.64 05/31/2015 1730   HGB 14.6 05/31/2015 1730   HCT 41.5 05/31/2015 1730   PLT 138 (L) 05/31/2015 1730   MCV 89.4 05/31/2015 1730   MCH 31.5 05/31/2015 1730   MCHC 35.2 05/31/2015 1730   RDW 13.4 05/31/2015 1730   LYMPHSABS 2.2 09/02/2014 1204   MONOABS 0.7 09/02/2014 1204   EOSABS 0.2 09/02/2014 1204   BASOSABS 0.0 09/02/2014 1204    CMP     Component Value Date/Time   NA 134 (L) 10/06/2015 0857   K 4.2 10/06/2015 0857   CL 99 10/06/2015 0857   CO2 27  10/06/2015 0857   GLUCOSE 67 10/06/2015 0857   BUN 14 10/06/2015 0857   CREATININE 1.13 10/06/2015 0857   CALCIUM 8.9 10/06/2015 0857   PROT 6.8 10/06/2015 0857   ALBUMIN 3.7 10/06/2015 0857   AST 22 10/06/2015 0857   ALT 17 10/06/2015 0857   ALKPHOS 70 10/06/2015 0857   BILITOT 0.5 10/06/2015 0857   GFRNONAA >60 06/01/2015 2038   GFRAA >60 06/01/2015 2038    Assessment: 1. Chronic cough: Patient reports cough for the past 3-4 years which was initially thought due to pulmonary disease, after recent CT and lung function testing, pulmonologist believes this may be related to chronic reflux symptoms 2. History of H. pylori and peptic ulcer disease: Patient reports history of H. pylori and peptic ulcer disease, ulcer diagnosed in 1991, no recent sensation of typical heartburn or reflux  Plan: 1. Reviewed case with Dr. Christella Hartigan at time of patient visit. Will schedule patient for EGD and Bravo placement for 48 hour pH study. Patient should abstain from all antireflux medications at this time, until after further testing. 2. Requested patient's last colonoscopy from [facility], this was done 5 years ago 3. Patient to follow with Dr. Christella Hartigan as recommended in the future.  Hyacinth Meeker, PA-C [facility] 05/01/2016, 9:43 AM  Cc: Gildardo Cranker, MD         Dr. Kendrick Fries

## 2016-05-02 NOTE — Anesthesia Postprocedure Evaluation (Signed)
Anesthesia Post Note  Patient: Jeffrey Cobb  Procedure(s) Performed: Procedure(s) (LRB): ESOPHAGOGASTRODUODENOSCOPY (EGD) WITH PROPOFOL (N/A) BRAVO PH STUDY (N/A)  Patient location during evaluation: PACU Anesthesia Type: MAC Level of consciousness: awake and alert Pain management: pain level controlled Vital Signs Assessment: post-procedure vital signs reviewed and stable Respiratory status: spontaneous breathing, nonlabored ventilation, respiratory function stable and patient connected to nasal cannula oxygen Cardiovascular status: stable and blood pressure returned to baseline Anesthetic complications: no    Last Vitals:  Vitals:   05/02/16 1300 05/02/16 1305  BP: (!) 178/93   Pulse: (!) 57 (!) 58  Resp: 18 (!) 23  Temp:      Last Pain:  Vitals:   05/02/16 1233  TempSrc: Oral                 Ellan Tess J

## 2016-05-03 ENCOUNTER — Encounter (HOSPITAL_COMMUNITY): Payer: Self-pay | Admitting: Gastroenterology

## 2016-05-08 DIAGNOSIS — R12 Heartburn: Secondary | ICD-10-CM

## 2016-05-08 NOTE — Op Note (Signed)
48 hr pH BRAVO  Interpretation / Findings 1. Normal esophageal acid exposure with % time pH<4 of 2.3% ; DeMeester score 8.4 (Normal <14.72) 2. Esophageal acid exposure is normal in upright and supine position 3. Poor symptom correlation for cough/heartburn with reflux events based on SAP (Symptom association probability)  Impression No evidence of significant gastro esophageal acid reflux Poor symptom correlation suggestive of functional heartburn  Iona BeardK.Veena Glen Blatchley, MD [facility]

## 2016-05-09 NOTE — Progress Notes (Signed)
I left a message for the patient to call back to discuss results.  

## 2016-05-10 ENCOUNTER — Telehealth: Payer: Self-pay

## 2016-05-10 NOTE — Telephone Encounter (Signed)
Patient notified of the results  

## 2016-05-10 NOTE — Telephone Encounter (Signed)
-----   Message from Rachael Feeaniel P Jacobs, MD sent at 05/09/2016  7:17 AM EDT ----- Tyrone NineVeena, [facility]   Jauna Raczynski, Can you call him.  Let him know that he does NOT have extra amounts of acid washing onto his esophagus and therefore it is very unlikely that acid/GERD is playing a role in his chronic cough. Also forward the result to his pulmonologist.  Thanks  DJ  ----- Message ----- From: Napoleon FormKavitha V Nandigam, MD Sent: 05/08/2016   4:48 PM To: Rachael Feeaniel P Jacobs, MD

## 2016-06-04 ENCOUNTER — Ambulatory Visit (INDEPENDENT_AMBULATORY_CARE_PROVIDER_SITE_OTHER): Payer: Medicare Other | Admitting: Pulmonary Disease

## 2016-06-04 ENCOUNTER — Encounter: Payer: Self-pay | Admitting: Pulmonary Disease

## 2016-06-04 VITALS — BP 116/82 | HR 67 | Ht 69.0 in | Wt 214.0 lb

## 2016-06-04 DIAGNOSIS — R059 Cough, unspecified: Secondary | ICD-10-CM

## 2016-06-04 DIAGNOSIS — J849 Interstitial pulmonary disease, unspecified: Secondary | ICD-10-CM | POA: Diagnosis not present

## 2016-06-04 DIAGNOSIS — R05 Cough: Secondary | ICD-10-CM

## 2016-06-04 DIAGNOSIS — R053 Chronic cough: Secondary | ICD-10-CM

## 2016-06-04 MED ORDER — HYDROCOD POLST-CPM POLST ER 10-8 MG/5ML PO SUER: 5.0000 mL | Freq: Two times a day (BID) | ORAL | Status: DC | PRN

## 2016-06-04 MED ORDER — ALBUTEROL SULFATE (2.5 MG/3ML) 0.083% IN NEBU: 2.5000 mg | INHALATION_SOLUTION | Freq: Four times a day (QID) | RESPIRATORY_TRACT | 11 refills | Status: DC | PRN

## 2016-06-04 MED ORDER — HYDROCOD POLST-CPM POLST ER 10-8 MG/5ML PO SUER: 5.0000 mL | Freq: Two times a day (BID) | ORAL | 0 refills | Status: DC | PRN

## 2016-06-04 NOTE — Assessment & Plan Note (Signed)
He has an ill-defined interstitial lung disease which is been slow to progress over the years. It is not characteristic of usual interstitial pneumonitis but that is still in the differential diagnosis, though be an unusual presentation for it. There is no clear evidence of recurrent aspiration is a previously suspected.  I explained to him the only way to know for certain what's going on would be to perform an open lung biopsy but is not interested in that nor do I think it would be very helpful other than providing an accurate diagnosis.  Plan: Continue monitoring with annual CT scans and pulmonary function testing If evidence of progression or if imaging is more suggestive of usual interstitial pneumonitis then consider treatment for IPF Repeat 6 minute walk test in 3 months Repeat high-resolution CT scan and PFT in June 2018

## 2016-06-04 NOTE — Addendum Note (Signed)
Addended by: Velvet BatheAULFIELD, Kishana Battey L on: 06/04/2016 10:51 AM   Modules accepted: Orders

## 2016-06-04 NOTE — Assessment & Plan Note (Signed)
He has been experiencing worsening cough after no episode of bronchitis recently. Infectious symptoms seem to have died down but he still suffering from chronic cough which is likely related to irritable larynx.  I'm appreciative of the gastroenterology team performing a 24-hour pH probe which was normal.  Plan: Prescribe albuterol nebulizer as this sometimes help with his cough though he does not have clear evidence of COPD I discussed enrollment in a clinical trial today, he is interested in this Prescribe Tussionex 1 bottle, no refills to help him get over this acute flareup

## 2016-06-04 NOTE — Addendum Note (Signed)
Addended by: Dorethea ClanAULFIELD, Jaskirat Zertuche L on: 06/04/2016 10:14 AM   Modules accepted: Orders

## 2016-06-04 NOTE — Patient Instructions (Signed)
Take the Tussionex twice a day as needed for cough, do not take this with any sedating medicines and do not take this and drive as it will make you sleepy We will see you back in 3 months with a 6 minute walk test In June 2018 we will repeat high-resolution CT scan of your chest and pulmonary function test We will reach out to our clinical trial team to consider having you enrolled in a clinical trial for chronic cough We will prescribe an albuterol nebulizer

## 2016-06-04 NOTE — Progress Notes (Signed)
Subjective:    Patient ID: Jeffrey Cobb, male    DOB: 1941/04/18, 75 y.o.   MRN: [id number]  Synopsis: Former patient of Dr. Shelle Iron who has chronic cough and an ill-defined pulmonary fibrosis syndrome. There has been slow progression on high-resolution CT chest imaging over the years and also very little incline and pulmonary function testing.  PFT"s 2009:  FEV1 3.49 (115%), ratio 65, DLCO 67%. PFT's 08/2014:  FEV1 3.13 (114%), ratio 69, decreased airtrapping from 2009, DLCO 49% Quit smoking in 1979 after 16 years of smoking 2 ppd Failed Gabapentin for cough in 2016 due to dizziness Failed Symbicort, Stiolto and Spiriva in that he felt like they really didn't help symptoms 01/2016 HRCT > traction bronchiolectasis and interlobular septal thickening in a basilar predominance have progressed compared to July 2016, there remains no frank honeycombing. There are multiple pulmonary nodules which are stable or have resolved. June 2017 pulmonary function testing FEV1 2.99 L (111% predicted, FVC 3.67 L (98% predicted), total lung capacity 5.53 L (85% predicted), DLCO 12.76 (45% predicted). August 2016 6 minute walk test 336 m O2 sats ration 92% on room air September 2017 pH probe showed normal pH and no clear evidence of gastroesophageal reflux.  HPI Chief Complaint  Patient presents with  . Follow-up    pt c/o prod cough with pale yellow mucus, SOB.  Denies CP, sinus congestion.       Jeffrey Cobb was recently treated with doxycline for acute bronchitis.  He said this helped him.  He has been coughing up yellow mucus and had a frequent cough with this.  He is producing a lot of mucus.  He says that the mucus is really thick, and he will get short of breath when he has the coughing spells.  No sinus congestion.  He was not prescribed any specific cough medicine but he has been taking tussin DM.  No fever.  He has had some dyspnea lately.  He has been using the albuterol inhaler which will sometimes  help.  He had his esophageal pH probe.   Past Medical History:  Diagnosis Date  . Acute sinusitis, unspecified   . Arthritis   . 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 GERD (gastroesophageal reflux disease)   . Left anterior fascicular block    hx of on ekg  . Mixed hyperlipidemia   . Obstructive chronic bronchitis with exacerbation (HCC)   . Occlusion and stenosis of carotid artery without mention of cerebral infarction    a. 05/2013 Carotid U/S: 1-39% bilat stenosis.  . Other emphysema (HCC)   . 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 Sleep apnea    does not use cpap due to cough  . Stroke [facility] 05/2015      Review of Systems  Constitutional: Negative for chills, fatigue and fever.  HENT: Positive for rhinorrhea. Negative for sinus pressure and sneezing.   Respiratory: Positive for cough. Negative for shortness of breath and wheezing.   Cardiovascular: Negative for chest pain, palpitations and leg swelling.       Objective:   Physical Exam Vitals:   06/04/16 0945  BP: 116/82  Pulse: 67  SpO2: 97%  Weight: 214 lb (97.1 kg)  Height: 5\' 9"  (1.753 m)   RA  Gen: well appearing HENT: OP clear, TM's clear, neck supple PULM: Crackles bases, normal percussion CV: RRR, no mgr, trace edema GI: BS+, soft, nontender Derm:  no cyanosis or rash Psyche: normal mood and affect today  Records form his 24 hour pH probe reviewed, see summary above.  CBC    Component Value Date/Time   WBC 6.7 05/31/2015 1730   RBC 4.64 05/31/2015 1730   HGB 14.6 05/31/2015 1730   HCT 41.5 05/31/2015 1730   PLT 138 (L) 05/31/2015 1730   MCV 89.4 05/31/2015 1730   MCH 31.5 05/31/2015 1730   MCHC 35.2 05/31/2015 1730   RDW 13.4 05/31/2015 1730   LYMPHSABS 2.2 09/02/2014 1204   MONOABS 0.7 09/02/2014 1204   EOSABS 0.2 09/02/2014 1204   BASOSABS 0.0 09/02/2014 1204        Assessment & Plan:  Chronic  cough He has been experiencing worsening cough after no episode of bronchitis recently. Infectious symptoms seem to have died down but he still suffering from chronic cough which is likely related to irritable larynx.  I'm appreciative of the gastroenterology team performing a 24-hour pH probe which was normal.  Plan: Prescribe albuterol nebulizer as this sometimes help with his cough though he does not have clear evidence of COPD I discussed enrollment in a clinical trial today, he is interested in this Prescribe Tussionex 1 bottle, no refills to help him get over this acute flareup  Interstitial lung disease (HCC) He has an ill-defined interstitial lung disease which is been slow to progress over the years. It is not characteristic of usual interstitial pneumonitis but that is still in the differential diagnosis, though be an unusual presentation for it. There is no clear evidence of recurrent aspiration is a previously suspected.  I explained to him the only way to know for certain what's going on would be to perform an open lung biopsy but is not interested in that nor do I think it would be very helpful other than providing an accurate diagnosis.  Plan: Continue monitoring with annual CT scans and pulmonary function testing If evidence of progression or if imaging is more suggestive of usual interstitial pneumonitis then consider treatment for IPF Repeat 6 minute walk test in 3 months Repeat high-resolution CT scan and PFT in June 2018    Current Outpatient Prescriptions:  .  aspirin 81 MG tablet, Take 81 mg by mouth daily., Disp: , Rfl:  .  atorvastatin (LIPITOR) 40 MG tablet, Take 1 tablet (40 mg total) by mouth daily at 6 PM., Disp: 30 tablet, Rfl: 0 .  meloxicam (MOBIC) 15 MG tablet, Take 1 tablet by mouth daily., Disp: , Rfl:  .  pramipexole (MIRAPEX) 0.5 MG tablet, TAKE 1 TABLET BY MOUTH EVERY MORNING AND TAKE 1 TABLET EVERY EVENING, Disp: 180 tablet, Rfl: 2 .  PROAIR HFA 108  (90 BASE) MCG/ACT inhaler, Inhale 2 puffs into the lungs every 6 (six) hours as needed., Disp: , Rfl:  .  QUEtiapine Fumarate (SEROQUEL PO), Take 50 mg by mouth at bedtime., Disp: , Rfl:   Current Facility-Administered Medications:  .  chlorpheniramine-HYDROcodone (TUSSIONEX) 10-8 MG/5ML suspension 5 mL, 5 mL, Oral, Q12H PRN, Lupita Leashouglas B McQuaid, MD

## 2016-08-09 ENCOUNTER — Ambulatory Visit (INDEPENDENT_AMBULATORY_CARE_PROVIDER_SITE_OTHER): Payer: Medicare Other | Admitting: Nurse Practitioner

## 2016-08-09 ENCOUNTER — Encounter: Payer: Self-pay | Admitting: Nurse Practitioner

## 2016-08-09 VITALS — BP 140/90 | HR 86 | Ht 69.0 in | Wt 212.4 lb

## 2016-08-09 DIAGNOSIS — G459 Transient cerebral ischemic attack, unspecified: Secondary | ICD-10-CM

## 2016-08-09 DIAGNOSIS — E782 Mixed hyperlipidemia: Secondary | ICD-10-CM | POA: Diagnosis not present

## 2016-08-09 DIAGNOSIS — G2581 Restless legs syndrome: Secondary | ICD-10-CM | POA: Diagnosis not present

## 2016-08-09 DIAGNOSIS — R209 Unspecified disturbances of skin sensation: Secondary | ICD-10-CM | POA: Diagnosis not present

## 2016-08-09 DIAGNOSIS — I779 Disorder of arteries and arterioles, unspecified: Secondary | ICD-10-CM

## 2016-08-09 DIAGNOSIS — I739 Peripheral vascular disease, unspecified: Principal | ICD-10-CM

## 2016-08-09 MED ORDER — PRAMIPEXOLE DIHYDROCHLORIDE 0.5 MG PO TABS: ORAL_TABLET | ORAL | 3 refills | Status: DC

## 2016-08-09 NOTE — Progress Notes (Signed)
[facility]  PATIENT: Nance PewRufus B Pask DOB: [birth year]   REASON FOR VISIT: Follow-up for CVA, restless legs, paresthesias, dyslipidemia, obstructive sleep apnea HISTORY FROM: Patient    HISTORY OF PRESENT ILLNESS:HistoryMr. Schleyer, 75 year old male returns for followup. He has a history of restless leg syndrome and sensory dysesthesias. He also has a history of obstructive sleep apnea with good compliance of CPAP in the past however due to his chronic cough in October last year which did not respond to antibiotics or prednisone. He has been unable to use his CPAP since that time . He was referred to ENT and now [facility]. On today's visit he also reports continued numbness, tingling/burning and discomfort in his feet, this is been an ongoing problem but it has worsened. EMG nerve conduction after last visit was normal. He has been placed on a trial of Neurontin by his primary care however had side effects of dizziness on the medication.He has a history of spinal stenosis and lumbar spine decompression 2011 by Dr. Gerlene FeeKritzer. He has had 2 epidural injections since last seen with minimal benefit. He denies any falls however he does feel off balance at times. He has intermittent back pain since his surgery. His restless legs are under good control with his Mirapex. He returns for reevaluation  MRI of the spine (lumbar) that he had done in April of 2009 showed moderate to severe spinal stenosis. He had lumbar spine decompression and fusion by Dr. Gerlene FeeKritzer in 2011, did very well. He also has a history of right and left knee replacements and a shoulder replacement. He is currently taking Mirapex for his restless legs tolerating the medication without drowsiness or any type of compulsive behaviors. iron profile was normal.   Update 12/5/2016PS : Patient is seen today for follow-up after her recent hospital admission for TIA on 05/31/15. He presented  following an episode of word finding difficulty as well as slurred speech. He was last known well at 4:30 PM on 05/31/2015. Deficits lasted about 45 minutes then resolved. He has no previous history of stroke nor TIA. [facility] and [facility]. He had not been on antiplatelets therapy. CT scan of his head showed no acute intracranial abnormality. NIH stroke score on admission evaluation was 0. Patient was not administered TPA secondary to Deficits resolved rapidly. He was admitted for further evaluation and treatment. MRI scan of the brain showed no acute infarct only mild age-related changes of cerebral atrophy and chronic small vessel ischemia. MRA of the brain showed no large vessel stenosis. Mild distal atheromatous changes were noted in MCA and PCA branches. Carotid ultrasound showed no significant Stenosis. Transthoracic echo showed normal ejection fraction. NIH stroke scale was 0. ABCD 2 score was 4. Patient qualified for and signed consent and participate in the PARFAIT TIA study. LDL cholesterol was elevated at 103 and he was started on Lipitor. He states his done well since discharge and has not had any recurrent stroke or TIA symptoms. He was seen by Dr. Roda ShuttersXu on 06/28/15 for 30 days study visit and study medication was discontinued. He is currently on aspirin 81 mg which is tolerating well without bleeding or bruising. He is also not having any side effects on Lipitor 40 mg daily which is taking everyday. He has not had any new complaints.  UPDATE 10/24/2015 CMMr Marton, 75 year old returns for followup. He was last seen in the office  by Dr. Pearlean Brownie 07/31/2015. He has history of TIA with an episode of word finding and slurred speech on 05/31/2015. He was started on baby aspirin. MRI without acute infarct. MRA no large vessel stenosis. Carotid ultrasound negative. LDL cholesterol was elevated and he was started on Lipitor. His restless legs are  in good control with Mirapex. He is also on amitriptyline at bedtime. He remains on Ambien for insomnia. He does not use his CPAP for his obstructive sleep apnea although he says he may start back.He has not had further stroke or TIA symptoms. He returns for reevaluation UPDATE 06/15/2017CM Mr. Howland, 75 year old male returns for follow-up. History of TIA events with word finding difficulty and slurred speech which occurred in October 2016. He has not had further stroke or TIA symptoms and is currently on aspirin and Lipitor for secondary stroke prevention. He also has a history of restless legs which are in good control with Mirapex. His amitriptyline has been tapered by his pulmonologist. He is currently not using CPAP due to shortness of breath. He is due for a pulmonary function next week.He continues to complain of  numbness, tingling/burning and discomfort in his feet, this is been an ongoing problem , EMG nerve conduction in the past x 2  was normal.. He returns for reevaluation UPDATE 12/15/2017CM Mr. Costanzo, 75 year old male returns for follow-up. He has a history of TIA event with word finding difficulty and slurred speech October 2016. He is currently on aspirin without bruising or bleeding. He has not had further stroke or TIA symptoms. In addition he is on Lipitor and denies myalgias. He is due to get labs done at primary care next week. He also has a long history of restless legs in good control with Mirapex. He has obstructive sleep apnea but does not use CPAP due to shortness of breath. He also has a history of chronic obstructive pulmonary disease and is due to have a pulmonary function test in January. Next Carotid Doppler's scheduled  October 11 2016 at  cardiovascular imaging. He does not exercise due to his shortness of breath. He returns for  reevaluation   REVIEW OF SYSTEMS: Full 14 system review of systems performed and notable only for those listed, all others are neg:    Constitutional: neg  Cardiovascular: neg Ear/Nose/Throat: neg  Skin: neg Eyes: neg Respiratory: Shortness of breath ,cough Gastroitestinal: neg  Hematology/Lymphatic: Easy bruising  Endocrine: Intolerance to cold Musculoskeletal: neg Allergy/Immunology: neg Neurological: neg Psychiatric: neg Sleep : Restless legs, obstructive sleep apnea does not use CPAP   ALLERGIES: Allergies  Allergen Reactions  . Gabapentin     Dizziness   . Sulfonamide Derivatives Rash  . Trazodone And Nefazodone Rash and Other (See Comments)    Hallucinations     HOME MEDICATIONS: Outpatient Medications Prior to Visit  Medication Sig Dispense Refill  . albuterol (PROVENTIL) (2.5 MG/3ML) 0.083% nebulizer solution Take 3 mLs (2.5 mg total) by nebulization every 6 (six) hours as needed for wheezing or shortness of breath. 360 mL 11  . aspirin 81 MG tablet Take 81 mg by mouth daily.    Marland Kitchen atorvastatin (LIPITOR) 40 MG tablet Take 1 tablet (40 mg total) by mouth daily at 6 PM. 30 tablet 0  . chlorpheniramine-HYDROcodone (TUSSIONEX PENNKINETIC ER) 10-8 MG/5ML SUER Take 5 mLs by mouth every 12 (twelve) hours as needed for cough. 140 mL 0  . meloxicam (MOBIC) 15 MG tablet Take 1 tablet by mouth daily.    . pramipexole (MIRAPEX) 0.5  MG tablet TAKE 1 TABLET BY MOUTH EVERY MORNING AND TAKE 1 TABLET EVERY EVENING 180 tablet 2  . PROAIR HFA 108 (90 BASE) MCG/ACT inhaler Inhale 2 puffs into the lungs every 6 (six) hours as needed.    Marland Kitchen. QUEtiapine Fumarate (SEROQUEL PO) Take 50 mg by mouth at bedtime.     No facility-administered medications prior to visit.     PAST MEDICAL HISTORY: Past Medical History:  Diagnosis Date  . Acute sinusitis, unspecified   . Arthritis   . 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 GERD (gastroesophageal reflux disease)   . Left anterior fascicular block    hx of on ekg  . Mixed hyperlipidemia   . Obstructive chronic bronchitis with  exacerbation (HCC)   . Occlusion and stenosis of carotid artery without mention of cerebral infarction    a. 05/2013 Carotid U/S: 1-39% bilat stenosis.  . Other emphysema (HCC)   . 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 Sleep apnea    does not use cpap due to cough  . Stroke [facility](HCC) 05/2015    PAST SURGICAL HISTORY: Past Surgical History:  Procedure Laterality Date  . APPENDECTOMY  1956  . BRAVO PH STUDY N/A 05/02/2016   Procedure: BRAVO PH STUDY;  Surgeon: Rachael Feeaniel P Jacobs, MD;  Location: [facility];  Service: Endoscopy;  Laterality: N/A;  . ESOPHAGOGASTRODUODENOSCOPY (EGD) WITH PROPOFOL N/A 05/02/2016   Procedure: ESOPHAGOGASTRODUODENOSCOPY (EGD) WITH PROPOFOL;  Surgeon: Rachael Feeaniel P Jacobs, MD;  Location: [facility];  Service: Endoscopy;  Laterality: N/A;  . EYE SURGERY Bilateral    cataract removal  . JOINT REPLACEMENT Right    shoulder replacement  . LEFT KNEE REPLACEMENT  2001  . RIGHT ANKLE RECONSTRUCTION    . RIGHT KNEE REPLACEMENT  2005  . RUPTURED DISK  2000  . SPINE SURGERY  2000/2011  . torn retina Left   . TOTAL SHOULDER ARTHROPLASTY Left 06/30/2013   Procedure: TOTAL SHOULDER ARTHROPLASTY;  Surgeon: Loreta Aveaniel F Murphy, MD;  [facility];  Service: Orthopedics;  Laterality: Left;    FAMILY HISTORY: Family History  Problem Relation Age of Onset  . Emphysema Mother   . Heart disease Mother   . Alzheimer's disease Mother   . Heart disease Father   . Stroke Paternal Grandfather   . Allergies      FH: FATHER,2 SISTER,1 BROTHER, SON  DAUGHTER  . Cancer Sister     SOCIAL HISTORY: Social History   Social History  . Marital status: Married    Spouse name: Myriam JacobsonHelen   . Number of children: 2  . Years of education: Assoc    Occupational History  . Retired Retired   Social History Main Topics  . Smoking status: Former Smoker    Packs/day: 3.00    Years: 17.00    Types: Cigarettes    Quit date: 08/26/1977  . Smokeless tobacco: Never  Used  . Alcohol use Yes     Comment: rarely  . Drug use: No  . Sexual activity: Not on file   Other Topics Concern  . Not on file   Social History Narrative   Patient is married Myriam Jacobson(Helen) and lives at home with his wife.   Retired -Patent examinerlaw enforcement    Patient has his Assoc   Patient has 2 children.    Patient is left handed   Patient drinks 1-2 cups of coffee in the morning and 3-4 cups of  tea daily.                 PHYSICAL EXAM  Vitals:   08/09/16 0922  BP: 140/90  Pulse: 86  Weight: 212 lb 6.4 oz (96.3 kg)  Height: 5\' 9"  (1.753 m)   Body mass index is 31.37 kg/m. Generalized: Well developed, Mildly obese male in no acute distress  Head: normocephalic and atraumatic,.  Neck: Supple, no carotid bruits  Cardiac: Regular rate rhythm, no murmur  Musculoskeletal: Arthritic changes in hands  Neurological examination   Mentation: Alert oriented to time, place, history taking. Follows all commands speech and language fluent.  Cranial nerve II-XII: Pupils were equal round reactive to light extraocular movements were full, visual field were full on confrontational test. Facial sensation and strength were normal. hearing was intact to finger rubbing bilaterally. Uvula tongue midline. head turning and shoulder shrug were normal and symmetric.Tongue protrusion into cheek strength was normal. Motor: normal bulk and tone, full strength in the BUE, BLE, fine finger movements normal, no pronator drift. No focal weakness Sensory: Mildly decreased light touch and pinprick to mid shin bilaterally, vibration normal. Proprioception normal  Coordination: finger-nose-finger, heel-to-shin bilaterally, no dysmetria Reflexes: Brachioradialis 2/2, biceps 2/2, triceps 2/2, patellar 0/0, Achilles 1/1, plantar responses were flexor bilaterally. Gait and Station: Rising up from seated position without assistance, normal stance, moderate stride, good arm swing, smooth turning, able to  perform tiptoe, and heel walking without difficulty. Tandem gait is  unsteady. No assistive device    DIAGNOSTIC DATA (LABS, IMAGING, TESTING) - I reviewed patient records, labs, notes, testing and imaging myself where available.      Component Value Date/Time   NA 134 (L) 10/06/2015 0857   K 4.2 10/06/2015 0857   CL 99 10/06/2015 0857   CO2 27 10/06/2015 0857   GLUCOSE 67 10/06/2015 0857   BUN 14 10/06/2015 0857   CREATININE 1.13 10/06/2015 0857   CALCIUM 8.9 10/06/2015 0857   PROT 6.8 10/06/2015 0857   ALBUMIN 3.7 10/06/2015 0857   AST 22 10/06/2015 0857   ALT 17 10/06/2015 0857   ALKPHOS 70 10/06/2015 0857   BILITOT 0.5 10/06/2015 0857   GFRNONAA >60 06/01/2015 2038   GFRAA >60 06/01/2015 2038    ASSESSMENT AND PLAN 75 y.o. year old male has a past medical history of restless leg syndrome Occlusion and stenosis of carotid artery without mention of cerebral infarction; Obstructive chronic bronchitis with exacerbation; Other emphysema; Mixed hyperlipidemia; Persistent disorder of initiating or maintaining sleep; Sleep apnea. 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 No further stroke or TIA symptoms.The patient is a current patient of Dr. Pearlean BrownieSethi  who is out of the office today . This note is sent to the work in doctor.     Continue aspirin for secondary stroke prevention Blood pressure with systolic 130/90 or below today's reading 1140/90 LDL goal below 100 remain on Lipitor Exercise regularly, healthy diet Continue Mirapex at current dose for restless legs will refill Follow-up with me in 1 year Nilda RiggsNancy Carolyn Martin, [facility] Petersburg[facility]NP, [facility], APRN  [facility] [address], [address] [place], [place] [address] [phone]

## 2016-08-09 NOTE — Patient Instructions (Addendum)
Continue aspirin for secondary stroke prevention Blood pressure with systolic 130/90 or below today's reading 140/90 LDL goal below 100 remain on Lipitor Exercise regularly, healthy diet Continue Mirapex at current dose for restless legs will refill Follow-up with me in 1 year

## 2016-08-28 NOTE — Progress Notes (Signed)
I reviewed note and agree with plan.   VIKRAM R. PENUMALLI, MD  Certified in Neurology, Neurophysiology and Neuroimaging  [facility] [address], [address] [place], [place] [address] [phone]   

## 2016-09-10 ENCOUNTER — Ambulatory Visit: Payer: Medicare Other | Admitting: Pulmonary Disease

## 2016-09-10 ENCOUNTER — Ambulatory Visit: Payer: Medicare Other

## 2016-09-27 ENCOUNTER — Encounter: Payer: Self-pay | Admitting: Pulmonary Disease

## 2016-09-27 ENCOUNTER — Ambulatory Visit (INDEPENDENT_AMBULATORY_CARE_PROVIDER_SITE_OTHER): Payer: Medicare Other | Admitting: Pulmonary Disease

## 2016-09-27 VITALS — BP 138/82 | HR 82 | Ht 69.0 in | Wt 210.0 lb

## 2016-09-27 DIAGNOSIS — R05 Cough: Secondary | ICD-10-CM | POA: Diagnosis not present

## 2016-09-27 DIAGNOSIS — J8410 Pulmonary fibrosis, unspecified: Secondary | ICD-10-CM

## 2016-09-27 DIAGNOSIS — J9611 Chronic respiratory failure with hypoxia: Secondary | ICD-10-CM | POA: Diagnosis not present

## 2016-09-27 DIAGNOSIS — J849 Interstitial pulmonary disease, unspecified: Secondary | ICD-10-CM

## 2016-09-27 DIAGNOSIS — R0602 Shortness of breath: Secondary | ICD-10-CM

## 2016-09-27 DIAGNOSIS — R053 Chronic cough: Secondary | ICD-10-CM

## 2016-09-27 NOTE — Assessment & Plan Note (Signed)
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 We will plan on a high-resolution CT scan of the chest now and pulmonary function testing. He may need anti-fibrotic therapy.

## 2016-09-27 NOTE — Assessment & Plan Note (Signed)
This is a new problem for him. Unfortunately today his O2 saturation dropped to 82% while walking. He's been experiencing worsening shortness of breath lately which is likely due to progressive fibrosis.  Plan: Start 2 L of oxygen with exertion and daily at bedtime Check overnight oximetry Workup as above

## 2016-09-27 NOTE — Progress Notes (Signed)
Subjective:    Patient ID: Jeffrey Cobb, male    DOB: September 03, 1940, 76 y.o.   MRN: [id number]  Synopsis: Former patient of Dr. Shelle Iron who has chronic cough and an ill-defined pulmonary fibrosis syndrome. There has been slow progression on high-resolution CT chest imaging over the years and also very little incline and pulmonary function testing.  Quit smoking in 1979 after 16 years of smoking 2 ppd Failed Gabapentin for cough in 2016 due to dizziness Failed Symbicort, Stiolto and Spiriva in that he felt like they really didn't help symptoms  HPI Chief Complaint  Patient presents with  . Follow-up    review 6mw.  pt c/o worsening sob with exertion, sometimes prod cough with white/clear/yellow mucus.      Kaymen says that for the last several months he has been short of breath all the time.  Just walking around the house makes him short of breath.  Climbing a hill will make him short of breath.  Pushing a cart will help him breathe better in a store.  He has been walking on a track and says that this makes him dyspneic.  He can walk on a treadmill without feeling dyspneic for 25 minutes at a time.  He feels out of shape in general.    His cough is improved but he still has a cough.  He will still have intermittent spells.    Past Medical History:  Diagnosis Date  . Acute sinusitis, unspecified   . Arthritis   . 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 GERD (gastroesophageal reflux disease)   . Left anterior fascicular block    hx of on ekg  . Mixed hyperlipidemia   . Obstructive chronic bronchitis with exacerbation (HCC)   . Occlusion and stenosis of carotid artery without mention of cerebral infarction    a. 05/2013 Carotid U/S: 1-39% bilat stenosis.  . Other emphysema (HCC)   . 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 Sleep apnea    does not use cpap due to cough  . Stroke [facility]) 05/2015      Review of  Systems  Constitutional: Negative for chills, fatigue and fever.  HENT: Positive for rhinorrhea. Negative for sinus pressure and sneezing.   Respiratory: Positive for cough. Negative for shortness of breath and wheezing.   Cardiovascular: Negative for chest pain, palpitations and leg swelling.       Objective:   Physical Exam Vitals:   09/27/16 0944  BP: 138/82  Pulse: 82  SpO2: 92%  Weight: 210 lb (95.3 kg)  Height: 5\' 9"  (1.753 m)   RA  Gen: chronically ill appearing HENT: OP clear, TM's clear, neck supple PULM: Crackles bases B, normal percussion CV: RRR, no mgr, trace edema GI: BS+, soft, nontender Derm: no cyanosis or rash Psyche: normal mood and affect   Records from his December 2017 office visit with neurology reviewed. He was treated for restless leg syndrome and secondary stroke prevention.  CBC    Component Value Date/Time   WBC 6.7 05/31/2015 1730   RBC 4.64 05/31/2015 1730   HGB 14.6 05/31/2015 1730   HCT 41.5 05/31/2015 1730   PLT 138 (L) 05/31/2015 1730   MCV 89.4 05/31/2015 1730   MCH 31.5 05/31/2015 1730   MCHC 35.2 05/31/2015 1730   RDW 13.4 05/31/2015 1730   LYMPHSABS 2.2 09/02/2014 1204   MONOABS 0.7 09/02/2014 1204   EOSABS 0.2  09/02/2014 1204   BASOSABS 0.0 09/02/2014 1204     PFT PFT"s 2009:  FEV1 3.49 (115%), ratio 65, DLCO 67%. PFT's 08/2014:  FEV1 3.13 (114%), ratio 69, decreased airtrapping from 2009, DLCO 49% June 2017 pulmonary function testing FEV1 2.99 L (111% predicted, FVC 3.67 L (98% predicted), total lung capacity 5.53 L (85% predicted), DLCO 12.76 (45% predicted).  Imaging: 01/2016 HRCT > traction bronchiolectasis and interlobular septal thickening in a basilar predominance have progressed compared to July 2016, there remains no frank honeycombing. There are multiple pulmonary nodules which are stable or have resolved.  6 minute walk test: August 2016 6 minute walk test 336 m O2 sats ration 92% on room air 04/27/2017 6  minute walk distance 292.5 m O2 sats ration and completion of test was 82% on room air.  Other testing: September 2017 pH probe showed normal pH and no clear evidence of gastroesophageal reflux.      Assessment & Plan:  Chronic cough This has been relatively stable in regards to his chronic cough. No further intervention needed right now.  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 We will plan on a high-resolution CT scan of the chest now and pulmonary function testing. He may need anti-fibrotic therapy.  Chronic respiratory failure with hypoxia [facility](HCC) This is a new problem for him. Unfortunately today his O2 saturation dropped to 82% while walking. He's been experiencing worsening shortness of breath lately which is likely due to progressive fibrosis.  Plan: Start 2 L of oxygen with exertion and daily at bedtime Check overnight oximetry Workup as above    Current Outpatient Prescriptions:  .  albuterol (PROVENTIL) (2.5 MG/3ML) 0.083% nebulizer solution, Take 3 mLs (2.5 mg total) by nebulization every 6 (six) hours as needed for wheezing or shortness of breath., Disp: 360 mL, Rfl: 11 .  aspirin 81 MG tablet, Take 81 mg by mouth daily., Disp: , Rfl:  .  atorvastatin (LIPITOR) 40 MG tablet, Take 1 tablet (40 mg total) by mouth daily at 6 PM., Disp: 30 tablet, Rfl: 0 .  chlorpheniramine-HYDROcodone (TUSSIONEX PENNKINETIC ER) 10-8 MG/5ML SUER, Take 5 mLs by mouth every 12 (twelve) hours as needed for cough., Disp: 140 mL, Rfl: 0 .  cyanocobalamin 500 MCG tablet, Take 1,000 mcg by mouth daily., Disp: , Rfl:  .  meloxicam (MOBIC) 15 MG tablet, Take 1 tablet by mouth daily., Disp: , Rfl:  .  oxymetazoline (AFRIN) 0.05 % nasal spray, Place 1 spray into both nostrils as needed for congestion., Disp: , Rfl:  .  pramipexole (MIRAPEX) 0.5 MG tablet, TAKE 1 TABLET BY MOUTH EVERY MORNING AND TAKE 1 TABLET  EVERY EVENING, Disp: 180 tablet, Rfl: 3 .  PROAIR HFA 108 (90 BASE) MCG/ACT inhaler, Inhale 2 puffs into the lungs every 6 (six) hours as needed., Disp: , Rfl:  .  QUEtiapine Fumarate (SEROQUEL PO), Take 50 mg by mouth at bedtime., Disp: , Rfl:

## 2016-09-27 NOTE — Addendum Note (Signed)
Addended by: Velvet BatheAULFIELD, Adonte Vanriper L on: 09/27/2016 10:36 AM   Modules accepted: Orders

## 2016-09-27 NOTE — Assessment & Plan Note (Signed)
This has been relatively stable in regards to his chronic cough. No further intervention needed right now.

## 2016-09-27 NOTE — Patient Instructions (Addendum)
Use 2 L of oxygen when you exert yourself and when you sleep. You do not need to use the oxygen if you are sitting still at rest and awake.  We will arrange for a lung function test  We will arrange for a high-resolution CT scan of your chest  I will bring you back in about 3-4 weeks, I recommend that you have your wife come on that visit so that we can talk about our steps moving forward together.

## 2016-10-11 ENCOUNTER — Encounter (HOSPITAL_COMMUNITY): Payer: Medicare Other

## 2016-10-11 ENCOUNTER — Ambulatory Visit (INDEPENDENT_AMBULATORY_CARE_PROVIDER_SITE_OTHER)
Admission: RE | Admit: 2016-10-11 | Discharge: 2016-10-11 | Disposition: A | Discharge status: Home / Self Care | Payer: Medicare Other | Source: Ambulatory Visit | Attending: Pulmonary Disease | Admitting: Pulmonary Disease

## 2016-10-11 DIAGNOSIS — J8410 Pulmonary fibrosis, unspecified: Secondary | ICD-10-CM

## 2016-10-13 IMAGING — MR MR MRA HEAD W/O CM
9 of 13 series · 31 of 48 positions shown · non-contrast
Comparison: Prior CT from 05/31/2015.

CLINICAL DATA: Initial evaluation for acute episode of word-finding
difficulty with slurred speech. Now resolved.

EXAM:
MRI HEAD WITHOUT CONTRAST
MRA HEAD WITHOUT CONTRAST
TECHNIQUE: Multiplanar, multiecho pulse sequences of the brain and surrounding
structures were obtained without intravenous contrast. Angiographic
images of the head were obtained using MRA technique without
contrast.

[Series 3: T1 · sagittal · 5.0mm · 0.47mm/px · 1 of 25 slices shown]
[im 1/25]
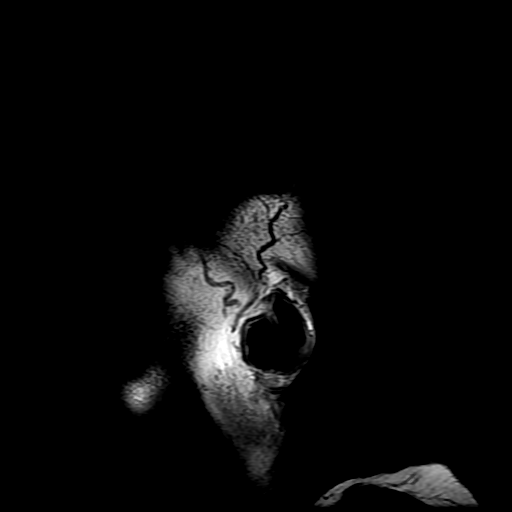

[Series 4: DWI · axial · 3.0mm · 1.09mm/px · z∈[-60,+86]mm · 7 of 102 slices shown (1 of 4)]
[im 1/102]
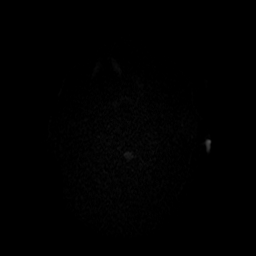
[im 17/102]
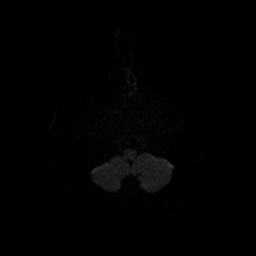
[im 34/102]
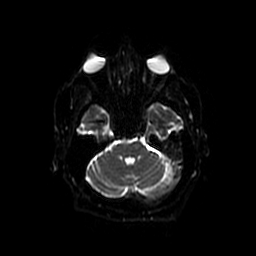
[im 51/102]
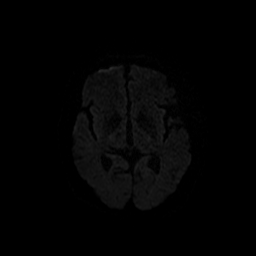
[im 68/102]
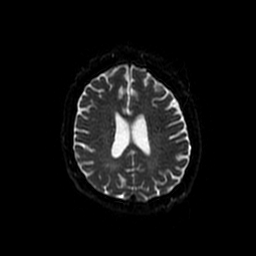
[im 85/102]
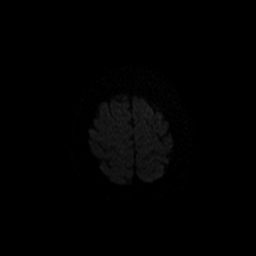
[im 102/102]
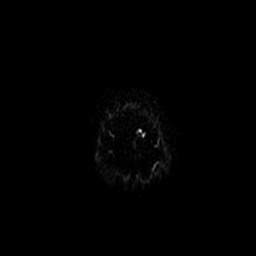

[Series 5: T2 · axial · 5.0mm · 0.43mm/px · z∈[-62,+84]mm · 2 of 26 slices shown (1 of 2)]
[im 1/26]
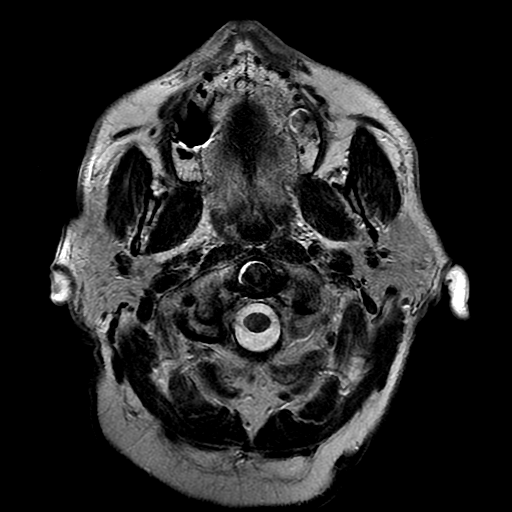
[im 26/26]
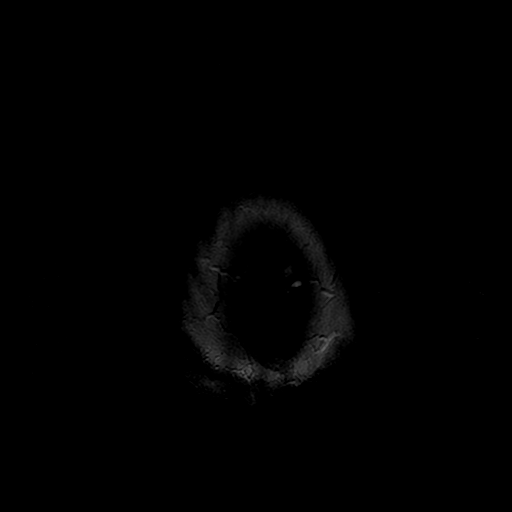

[Series 6: DWI · coronal · 5.0mm · 1.09mm/px · 5 of 66 slices shown (2 of 4)]
[im 1/66]
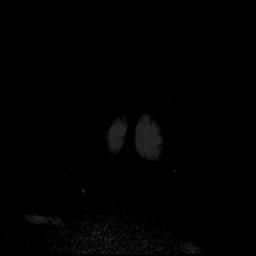
[im 17/66]
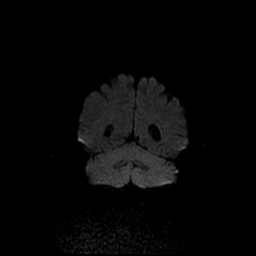
[im 33/66]
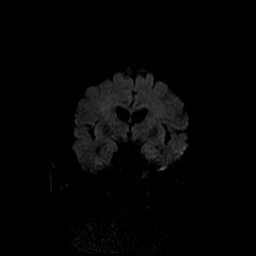
[im 49/66]
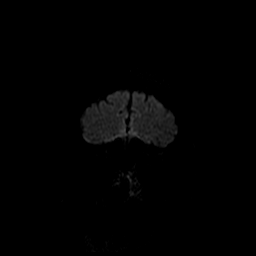
[im 66/66]
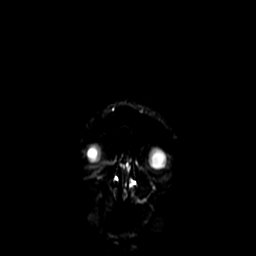

[Series 7: (id) mt fs · axial · 1.4mm · 0.43mm/px · z∈[-83,-31]mm · 5 of 136 slices shown]
[im 1/136]
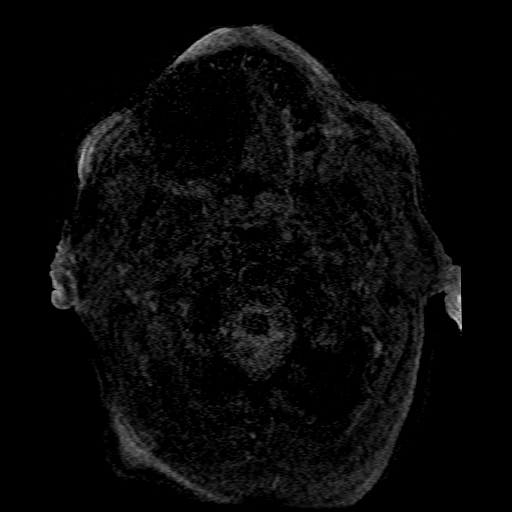
[im 16/136]
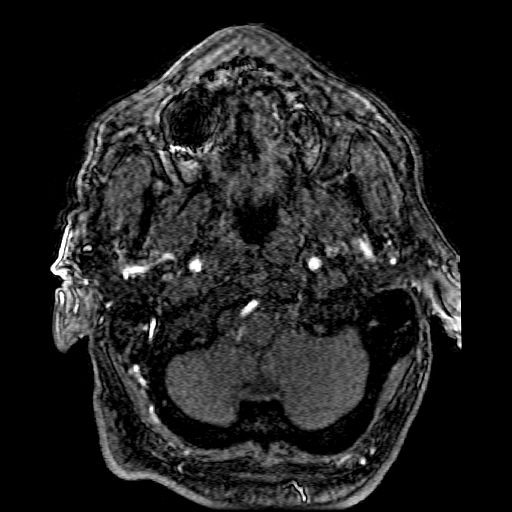
[im 46/136]
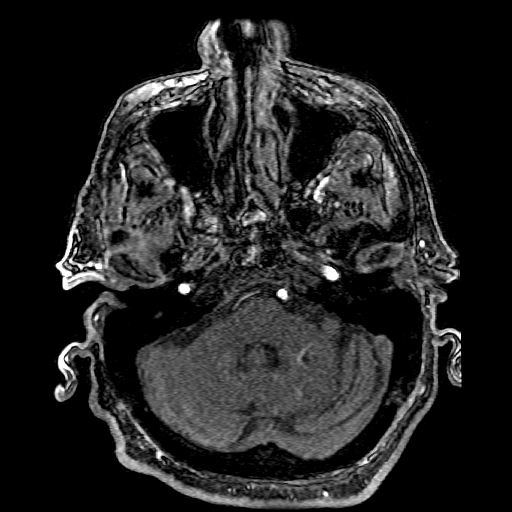
[im 61/136]
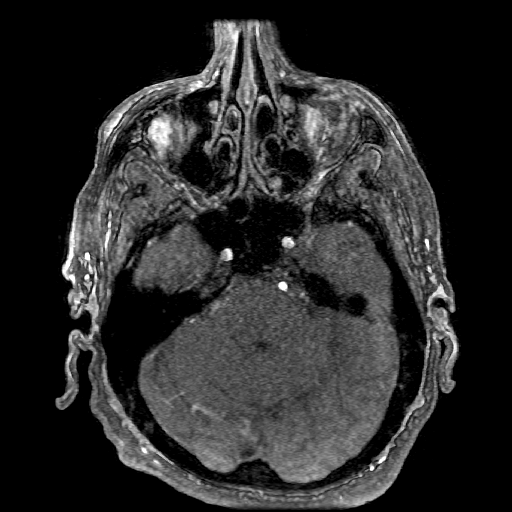
[im 76/136]
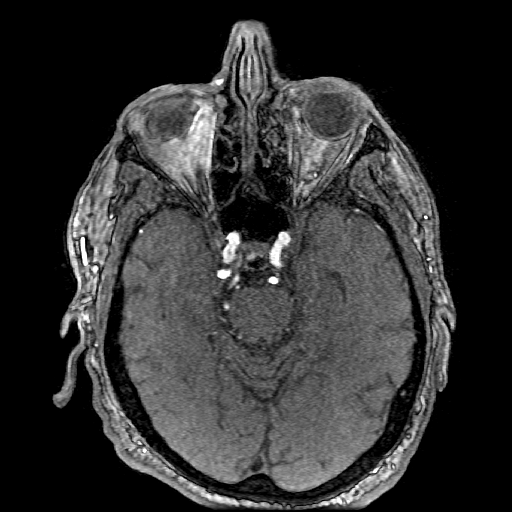

[Series 8: FLAIR · axial · 5.0mm · 0.43mm/px · z∈[-62,+84]mm · 2 of 26 slices shown]
[im 1/26]
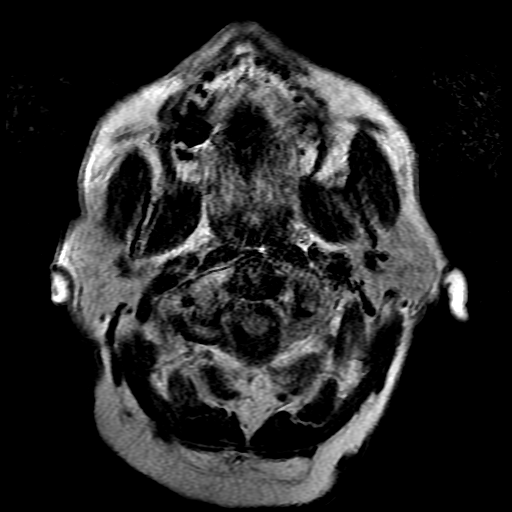
[im 26/26]
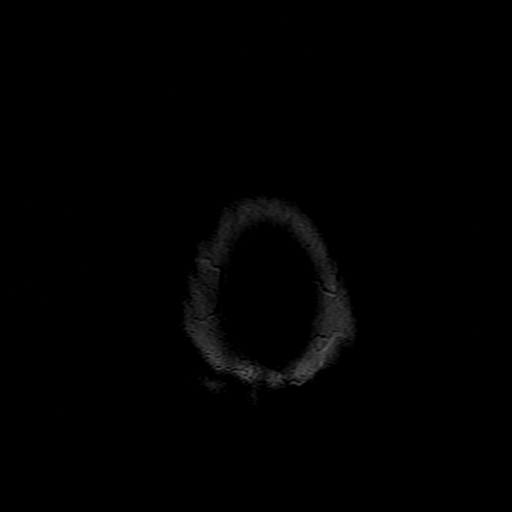

[Series 11: T2 · coronal · 5.0mm · 0.39mm/px · 2 of 25 slices shown (2 of 2)]
[im 1/25]
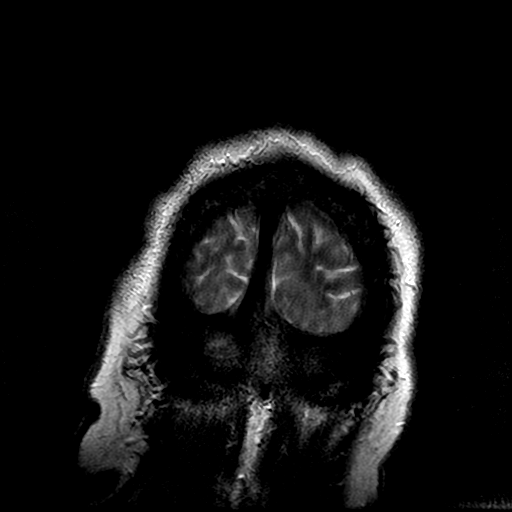
[im 25/25]
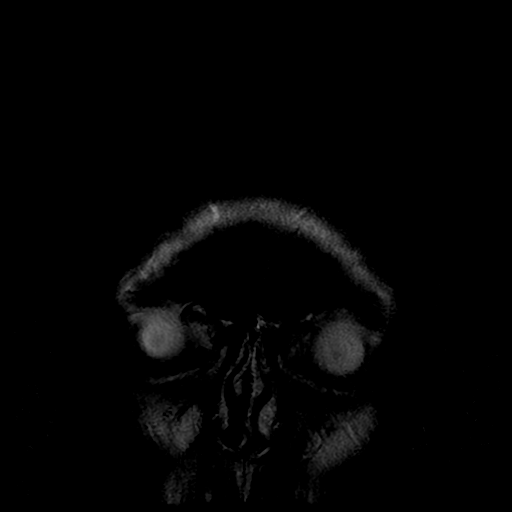

[Series 400: DWI · axial · 3.0mm · 1.09mm/px · z∈[-60,+86]mm · 4 of 51 slices shown (3 of 4)]
[im 1/51]
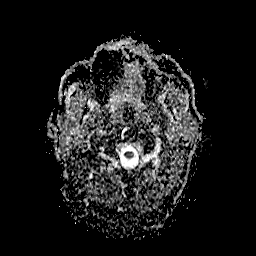
[im 17/51]
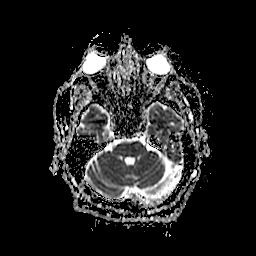
[im 34/51]
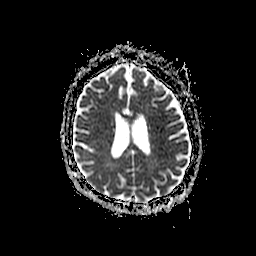
[im 51/51]
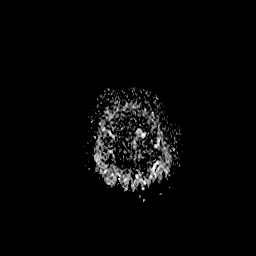

[Series 600: DWI · coronal · 5.0mm · 1.09mm/px · 3 of 33 slices shown (4 of 4)]
[im 1/33]
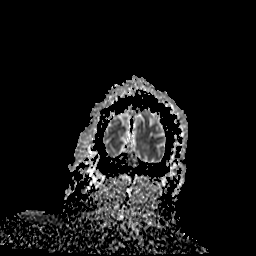
[im 17/33]
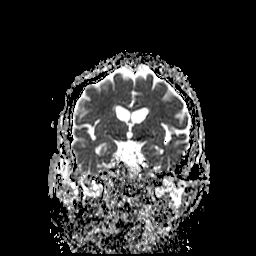
[im 33/33]
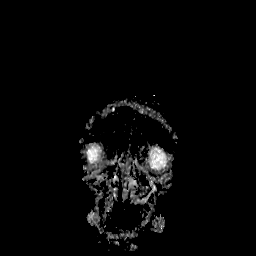

[31 of 48 positions shown; findings below may reference images not displayed]

FINDINGS: MRI HEAD FINDINGS

No abnormal foci of restricted diffusion to suggest acute
intracranial infarct. Gray-white matter differentiation maintained.
Normal intravascular flow voids are preserved. No acute or chronic
intracranial hemorrhage.

Mild age-related cerebral atrophy present. There is minimal T2/FLAIR
hyperintensity within the periventricular and deep white matter both
cerebral hemispheres noted, most likely related to chronic small
vessel ischemic disease.

No mass lesion or midline shift. No mass effect. No hydrocephalus.
No extra-axial fluid collection.

Craniocervical junction widely patent. Mild degenerative
spondylolysis noted within the visualized upper cervical spine.
Pituitary gland within normal limits. No acute abnormality about the
orbits. Sequela prior bilateral lens extraction noted. Mild mucosal
thickening within the paranasal sinuses. No mastoid effusion. Inner
ear structures normal.

Bone marrow signal intensity within normal limits. Scalp soft
tissues unremarkable.

MRA HEAD FINDINGS

ANTERIOR CIRCULATION:

Visualized distal cervical segments of the internal carotid arteries
are patent with antegrade flow. The petrous segments are widely
patent. Mild dolichoectasia of the cavernous and supraclinoid
segments bilaterally. A1 segments, anterior communicating artery,
and anterior cerebral arteries well opacified. M1 segments patent
without stenosis or occlusion. MCA bifurcations normal. Distal MCA
branches symmetric and well opacified bilaterally. Mild distal
branch irregularity.

POSTERIOR CIRCULATION:

Vertebral arteries are patent to the vertebrobasilar junction. The
right vertebral artery is dominant. Posterior inferior cerebral
arteries grossly patent, although in not well evaluated on this
exam. Basilar artery widely patent. Small right anterior inferior
cerebral artery noted. Superior cerebellar arteries patent
bilaterally. There is fetal origin of the right PCA with a widely
patent right posterior communicating artery. Left P1 and P2 segments
well opacified. Mild distal branch or a

No annulus or vascular malformation.
IMPRESSION: MRI HEAD IMPRESSION:

1. No acute intracranial infarct or other process identified.
2. Mild age-related cerebral atrophy with chronic small vessel
ischemic disease.

MRA HEAD IMPRESSION:

1. No high-grade or correctable stenosis identified within the
intracranial circulation.
2. Mild distal branch atheromatous irregularity within the MCA and
PCA branches bilaterally.

## 2016-10-14 ENCOUNTER — Telehealth: Payer: Self-pay | Admitting: Pulmonary Disease

## 2016-10-14 NOTE — Telephone Encounter (Signed)
Notes Recorded by Lupita Leashouglas B McQuaid, MD on 10/14/2016 at 1:35 AM EST A, Please let the patient know this showed more scarring in his lungs. We will discuss more on the next visit. Thanks, B -------------------------------------- Spoke with pt. He is aware of his CT results. Nothing further was needed.

## 2016-10-14 NOTE — Progress Notes (Signed)
Left voicemail for patient to contact office for medical results.

## 2016-10-16 ENCOUNTER — Other Ambulatory Visit: Payer: Self-pay | Admitting: Cardiology

## 2016-10-16 DIAGNOSIS — I6523 Occlusion and stenosis of bilateral carotid arteries: Secondary | ICD-10-CM

## 2016-10-23 ENCOUNTER — Ambulatory Visit (HOSPITAL_COMMUNITY)
Admission: RE | Admit: 2016-10-23 | Discharge: 2016-10-23 | Disposition: A | Discharge status: Home / Self Care | Payer: Medicare Other | Source: Ambulatory Visit | Attending: Cardiovascular Disease | Admitting: Cardiovascular Disease

## 2016-10-23 DIAGNOSIS — I6523 Occlusion and stenosis of bilateral carotid arteries: Secondary | ICD-10-CM | POA: Diagnosis not present

## 2016-10-28 ENCOUNTER — Encounter: Payer: Self-pay | Admitting: Pulmonary Disease

## 2016-10-28 ENCOUNTER — Ambulatory Visit (HOSPITAL_COMMUNITY)
Admission: RE | Admit: 2016-10-28 | Discharge: 2016-10-28 | Disposition: A | Discharge status: Home / Self Care | Payer: Medicare Other | Source: Ambulatory Visit | Attending: Pulmonary Disease | Admitting: Pulmonary Disease

## 2016-10-28 ENCOUNTER — Other Ambulatory Visit (INDEPENDENT_AMBULATORY_CARE_PROVIDER_SITE_OTHER): Payer: Medicare Other

## 2016-10-28 ENCOUNTER — Ambulatory Visit (INDEPENDENT_AMBULATORY_CARE_PROVIDER_SITE_OTHER): Payer: Medicare Other | Admitting: Pulmonary Disease

## 2016-10-28 VITALS — BP 138/76 | HR 100 | Ht 67.0 in | Wt 210.0 lb

## 2016-10-28 DIAGNOSIS — J849 Interstitial pulmonary disease, unspecified: Secondary | ICD-10-CM

## 2016-10-28 DIAGNOSIS — J9611 Chronic respiratory failure with hypoxia: Secondary | ICD-10-CM | POA: Diagnosis not present

## 2016-10-28 DIAGNOSIS — R0600 Dyspnea, unspecified: Secondary | ICD-10-CM

## 2016-10-28 DIAGNOSIS — J84112 Idiopathic pulmonary fibrosis: Secondary | ICD-10-CM

## 2016-10-28 DIAGNOSIS — J8410 Pulmonary fibrosis, unspecified: Secondary | ICD-10-CM

## 2016-10-28 LAB — PULMONARY FUNCTION TEST
DL/VA % PRED: 49 %
DL/VA: 2.16 ml/min/mmHg/L
DLCO UNC % PRED: 31 %
DLCO UNC: 9.05 ml/min/mmHg
FEF 25-75 POST: 2.39 L/s
FEF 25-75 PRE: 2.31 L/s
FEF2575-%Change-Post: 3 %
FEF2575-%PRED-POST: 126 %
FEF2575-%PRED-PRE: 121 %
FEV1-%Change-Post: 0 %
FEV1-%Pred-Post: 88 %
FEV1-%Pred-Pre: 89 %
FEV1-POST: 2.36 L
FEV1-Pre: 2.36 L
FEV1FVC-%Change-Post: 0 %
FEV1FVC-%PRED-PRE: 111 %
FEV6-%Change-Post: 0 %
FEV6-%PRED-POST: 85 %
FEV6-%Pred-Pre: 85 %
FEV6-POST: 2.94 L
FEV6-Pre: 2.94 L
FEV6FVC-%PRED-POST: 107 %
FEV6FVC-%Pred-Pre: 107 %
FVC-%Change-Post: 0 %
FVC-%PRED-POST: 79 %
FVC-%Pred-Pre: 79 %
FVC-Post: 2.94 L
FVC-Pre: 2.94 L
POST FEV1/FVC RATIO: 80 %
PRE FEV1/FVC RATIO: 80 %
Post FEV6/FVC ratio: 100 %
Pre FEV6/FVC Ratio: 100 %
RV % pred: 57 %
RV: 1.37 L
TLC % pred: 69 %
TLC: 4.51 L

## 2016-10-28 LAB — HEPATIC FUNCTION PANEL
ALT: 16 U/L (ref 0–53)
AST: 22 U/L (ref 0–37)
Albumin: 4.1 g/dL (ref 3.5–5.2)
Alkaline Phosphatase: 73 U/L (ref 39–117)
BILIRUBIN DIRECT: 0.2 mg/dL (ref 0.0–0.3)
BILIRUBIN TOTAL: 0.7 mg/dL (ref 0.2–1.2)
Total Protein: 7.5 g/dL (ref 6.0–8.3)

## 2016-10-28 LAB — C-REACTIVE PROTEIN: CRP: 0.2 mg/dL — AB (ref 0.5–20.0)

## 2016-10-28 LAB — SEDIMENTATION RATE: SED RATE: 18 mm/h (ref 0–20)

## 2016-10-28 MED ORDER — ALBUTEROL SULFATE (2.5 MG/3ML) 0.083% IN NEBU
2.5000 mg | INHALATION_SOLUTION | Freq: Once | RESPIRATORY_TRACT | Status: AC
  Administered 2016-10-28: 2.5 mg via RESPIRATORY_TRACT

## 2016-10-28 NOTE — Progress Notes (Signed)
Subjective:    Patient ID: Jeffrey Cobb, male    DOB: [birth year], 76 y.o.   MRN: [id number]  Synopsis: Former patient of Dr. Shelle Ironlance who has chronic cough and UIP There has been slow progression on high-resolution CT chest imaging over the years and also very little incline and pulmonary function testing.  Quit smoking in 1979 after 16 years of smoking 2 ppd Failed Gabapentin for cough in 2016 due to dizziness Failed Symbicort, Stiolto and Spiriva in that he felt like they really didn't help symptoms He startedon oxygen in 09/2016  HPI Chief Complaint  Patient presents with  . Follow-up    pt doing well, notes sob with exertion.     He notes prior heavy reflux in years past, none lately.  He said that the last few months he hasn't had this problem.  He is currently not taking acid reflux.  He has never been told that he has anything other than osteoarthritis. He has seen rheumatology in the past.  He has not had a dignaosis of a connective tissue disease. He has had a rash on his arms, back and chest for a few years.  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  He describes a small punctate red, itching rash on the extensor surface of both arms.  Sometimes this will show up on his back and chest.  He has dry mouth a lot, some dry eyes.    He has not been able to use his CPAP machine regularly due to severe dry mouth.    Past Medical History:  Diagnosis Date  . Acute sinusitis, unspecified   . Arthritis   . 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 GERD (gastroesophageal reflux disease)   . Left anterior fascicular block    hx of on ekg  . Mixed hyperlipidemia   . Obstructive chronic bronchitis with exacerbation (HCC)   . Occlusion and stenosis of carotid artery without mention of cerebral infarction    a. 05/2013 Carotid U/S: 1-39% bilat stenosis.  . Other emphysema (HCC)   . Persistent disorder of initiating or maintaining  sleep    insomnia  . Sleep apnea    does not use cpap due to cough  . Stroke [facility](HCC) 05/2015      Review of Systems  Constitutional: Negative for chills, fatigue and fever.  HENT: Positive for rhinorrhea. Negative for sinus pressure and sneezing.   Respiratory: Positive for cough. Negative for shortness of breath and wheezing.   Cardiovascular: Negative for chest pain, palpitations and leg swelling.       Objective:   Physical Exam Vitals:   10/28/16 1454  BP: 138/76  Pulse: 100  SpO2: 91%  Weight: 210 lb (95.3 kg)  Height: 5\' 7"  (1.702 m)   RA  Gen: well appearing HENT: OP clear, TM's clear, neck supple PULM: Crackles bases bilaterally B, normal percussion CV: RRR, no mgr, trace edema GI: BS+, soft, nontender Derm: no cyanosis or rash Psyche: normal mood and affect      CBC    Component Value Date/Time   WBC 6.7 05/31/2015 1730   RBC 4.64 05/31/2015 1730   HGB 14.6 05/31/2015 1730   HCT 41.5 05/31/2015 1730   PLT 138 (L) 05/31/2015 1730   MCV 89.4 05/31/2015 1730   MCH 31.5 05/31/2015 1730   MCHC 35.2 05/31/2015 1730   RDW 13.4 05/31/2015 1730   LYMPHSABS 2.2 09/02/2014 1204  MONOABS 0.7 09/02/2014 1204   EOSABS 0.2 09/02/2014 1204   BASOSABS 0.0 09/02/2014 1204     PFT PFT"s 2009:  FEV1 3.49 (115%), ratio 65, DLCO 67%. PFT's 08/2014:  FEV1 3.13 (114%), ratio 69, decreased airtrapping from 2009, DLCO 49% June 2017 pulmonary function testing FEV1 2.99 L (111% predicted, FVC 3.67 L (98% predicted), total lung capacity 5.53 L (85% predicted), DLCO 12.76 (45% predicted). March 2018 pulmonary function testing ratio 80%, FVC 2.94 L 79% predicted, total lung capacity 4.51 L 69% predicted, DLCO 9.05 31% predicted  Imaging: 01/2016 HRCT > traction bronchiolectasis and interlobular septal thickening in a basilar predominance have progressed compared to July 2016, there remains no frank honeycombing. There are multiple pulmonary nodules which are stable or have  resolved. February 2018 high-resolution CT chest independently reviewed showing traction bronchiectasis, interlobular septal thickening and honeycombing worse in the periphery and bases.  6 minute walk test: August 2016 6 minute walk test 336 m O2 sats ration 92% on room air 04/27/2017 6 minute walk distance 292.5 m O2 sats ration and completion of test was 82% on room air.  Other testing: September 2017 pH probe showed normal pH and no clear evidence of gastroesophageal reflux.      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  He has a UIP pattern on his HRct and no clinical features of an underlying connective tissue disease.   We had a frank discussion today about this condition and about the consequences of this diagnosis with his wife here.  We discussed the significance of this today and their questions today in a 25 minute face to face conversation.    Plan: Check serology panel for connective tissue diseases If these are negative as I expect then we will start Ofev for IPF Every 6 month pulmonary function testing and 6 minute walk Follow-up 6 weeks with liver function panel  > 50% of this 35 minute visit spent face to face  Chronic respiratory failure with hypoxia (HCC) Continue 2 L of oxygen daily at bedtime and with exertion    Current Outpatient Prescriptions:  .  albuterol (PROVENTIL) (2.5 MG/3ML) 0.083% nebulizer solution, Take 3 mLs (2.5 mg total) by nebulization every 6 (six) hours as needed for wheezing or shortness of breath., Disp: 360 mL, Rfl: 11 .  aspirin 81 MG tablet, Take 81 mg by mouth daily., Disp: , Rfl:  .  atorvastatin (LIPITOR) 40 MG tablet, Take 1 tablet (40 mg total) by mouth daily at 6 PM., Disp: 30 tablet, Rfl: 0 .  chlorpheniramine-HYDROcodone (TUSSIONEX PENNKINETIC ER) 10-8 MG/5ML SUER, Take 5 mLs by mouth every 12 (twelve) hours as needed for cough., Disp: 140 mL, Rfl: 0 .  cyanocobalamin 500 MCG  tablet, Take 1,000 mcg by mouth daily., Disp: , Rfl:  .  meloxicam (MOBIC) 15 MG tablet, Take 1 tablet by mouth daily., Disp: , Rfl:  .  oxymetazoline (AFRIN) 0.05 % nasal spray, Place 1 spray into both nostrils as needed for congestion., Disp: , Rfl:  .  pramipexole (MIRAPEX) 0.5 MG tablet, TAKE 1 TABLET BY MOUTH EVERY MORNING AND TAKE 1 TABLET EVERY EVENING, Disp: 180 tablet, Rfl: 3 .  PROAIR HFA 108 (90 BASE) MCG/ACT inhaler, Inhale 2 puffs into the lungs every 6 (six) hours as needed., Disp: , Rfl:  .  QUEtiapine Fumarate (SEROQUEL PO), Take 50 mg by mouth at bedtime., Disp: , Rfl:

## 2016-10-28 NOTE — Patient Instructions (Signed)
We will check blood work today to look for evidence of a connective tissue disease and then call you with these results Assuming these results are negative we restart chew on a medicine called Ofev Keep using her oxygen as you are doing We will see you back in 6 weeks or sooner if needed

## 2016-10-28 NOTE — Assessment & Plan Note (Signed)
Continue 2 L of oxygen daily at bedtime and with exertion

## 2016-10-28 NOTE — Assessment & Plan Note (Signed)
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  He has a UIP pattern on his HRct and no clinical features of an underlying connective tissue disease.   We had a frank discussion today about this condition and about the consequences of this diagnosis with his wife here.  We discussed the significance of this today and their questions today in a 25 minute face to face conversation.    Plan: Check serology panel for connective tissue diseases If these are negative as I expect then we will start Ofev for IPF Every 6 month pulmonary function testing and 6 minute walk Follow-up 6 weeks with liver function panel  > 50% of this 35 minute visit spent face to face

## 2016-10-29 LAB — SJOGRENS SYNDROME-A EXTRACTABLE NUCLEAR ANTIBODY
SSA (RO) (ENA) ANTIBODY, IGG: NEGATIVE
SSA (Ro) (ENA) Antibody, IgG: <1

## 2016-10-29 LAB — ANTI-NUCLEAR AB-TITER (ANA TITER): ANA Titer 1: 1:40 {titer} — ABNORMAL HIGH

## 2016-10-29 LAB — SJOGRENS SYNDROME-B EXTRACTABLE NUCLEAR ANTIBODY: SSB (La) (ENA) Antibody, IgG: <1

## 2016-10-29 LAB — ANA: ANA: POSITIVE — AB

## 2016-10-29 LAB — ANTI-SCLERODERMA ANTIBODY: Scleroderma (Scl-70) (ENA) Antibody, IgG: <1

## 2016-10-29 LAB — RHEUMATOID FACTOR

## 2016-10-29 LAB — CENTROMERE ANTIBODIES: Centromere Ab Screen: <1

## 2016-10-30 ENCOUNTER — Telehealth: Payer: Self-pay | Admitting: Pulmonary Disease

## 2016-10-30 LAB — ALDOLASE: ALDOLASE: 4.2 U/L (ref <=8.1)

## 2016-10-30 LAB — ANTI-JO 1 ANTIBODY, IGG

## 2016-10-30 NOTE — Telephone Encounter (Signed)
Forms were filled out the best of my ability. They have been placed in BQ's look at. [facility].

## 2016-10-31 ENCOUNTER — Other Ambulatory Visit: Payer: Self-pay

## 2016-10-31 NOTE — Telephone Encounter (Signed)
Forms have been filled to completion, placed in BQ's look-at folder for signature.

## 2016-11-03 LAB — HYPERSENSITIVITY PNUEMONITIS PROFILE

## 2016-11-04 ENCOUNTER — Encounter: Payer: Self-pay | Admitting: Pulmonary Disease

## 2016-11-12 NOTE — Telephone Encounter (Signed)
[facility], please advise if these have been signed. Thanks.

## 2016-11-13 NOTE — Telephone Encounter (Signed)
This form has been signed and faxed to [facility] with a confirmation receipt received.  Will hold message to see if a PA is needed.

## 2016-11-15 ENCOUNTER — Encounter (HOSPITAL_COMMUNITY): Payer: Self-pay

## 2016-11-15 ENCOUNTER — Encounter (HOSPITAL_COMMUNITY)
Admission: RE | Admit: 2016-11-15 | Discharge: 2016-11-15 | Disposition: A | Discharge status: Home / Self Care | Payer: Medicare Other | Source: Ambulatory Visit | Attending: Pulmonary Disease | Admitting: Pulmonary Disease

## 2016-11-15 VITALS — BP 145/90 | HR 61 | Resp 18 | Ht 66.0 in | Wt 212.7 lb

## 2016-11-15 DIAGNOSIS — J849 Interstitial pulmonary disease, unspecified: Secondary | ICD-10-CM | POA: Insufficient documentation

## 2016-11-15 NOTE — Progress Notes (Signed)
Jeffrey Cobb 76 y.o. male Pulmonary Rehab Orientation Note Patient arrived today in Cardiac and Pulmonary Rehab for orientation to Pulmonary Rehab. [facility] via wheel chair. He does carry portable oxygen intermittantly. He has been prescribed oxygen at 4 liters with any exertion. He has also been prescribed CPAP for OSA but has not been able to tolerate the mask. Encouraged patient to continue to attempt compliance with CPAP prescription and discuss barriers with MD. Per pt, he uses oxygen  occasionally. Color good, skin warm and dry. Patient is oriented to time and place. Patient's medical history, psychosocial health, and medications reviewed. Psychosocial assessment reveals pt lives with their spouse. Pt is currently retired. 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 Pt hobbies include painting. He takes art class twice a week. Pt reports his stress level is low. Pt does not exhibit exhibits signs of depression PHQ2/9 score 0/na. Pt shows good  coping skills with positive outlook. He was offered emotional support and reassurance. Will continue to monitor and evaluate progress toward psychosocial goal(s) of remaining positive about his recent diagnosis of IPF. Physical assessment reveals heart rate is normal, breath sounds fine crackles right middle. Grip strength equal, weak related to osteoarthritis and slight crippling of the fingers. Distal pulses faint. Patient reports he does take medications as prescribed. Patient states he follows a Regular diet. The patient reports no specific efforts to gain or lose weight.. Patient's weight will be monitored closely. Demonstration and practice of PLB using pulse oximeter. Patient able to return demonstration satisfactorily. Safety and hand hygiene in the exercise area reviewed with patient. Patient voices understanding of the information reviewed. Department expectations discussed with patient and achievable goals were set. The patient  shows enthusiasm about attending the program and we look forward to working with this nice gentleman. The patient is scheduled for a 6 min walk test on Tuesday 3/27 and to begin exercise on Thursday 12/05/16 at 1030.   45 minutes was spent on a variety of activities such as assessment of the patient, obtaining baseline data including height, weight, BMI, and grip strength, verifying medical history, allergies, and current medications, and teaching patient strategies for performing tasks with less respiratory effort with emphasis on pursed lip breathing.

## 2016-11-19 ENCOUNTER — Encounter (HOSPITAL_COMMUNITY)
Admission: RE | Admit: 2016-11-19 | Discharge: 2016-11-19 | Disposition: A | Discharge status: Home / Self Care | Payer: Medicare Other | Source: Ambulatory Visit | Attending: Pulmonary Disease | Admitting: Pulmonary Disease

## 2016-11-19 DIAGNOSIS — J849 Interstitial pulmonary disease, unspecified: Secondary | ICD-10-CM | POA: Diagnosis not present

## 2016-11-19 NOTE — Telephone Encounter (Signed)
Jeffrey Cobb please advise on this message.  Anything further needed?

## 2016-11-19 NOTE — Progress Notes (Signed)
Pulmonary Individual Treatment Plan  Patient Details  Name: Jeffrey Cobb MRN: [id number] Date of Birth: [birth year] Referring Provider:     Pulmonary Rehab Walk Test from 11/19/2016 in [facility]  Referring Provider  Dr. Kendrick Fries      Initial Encounter Date:    Pulmonary Rehab Walk Test from 11/19/2016 in [facility]  Date  11/19/16  Referring Provider  Dr. Kendrick Fries      Visit Diagnosis: ILD (interstitial lung disease) (HCC)  Patient's Home Medications on Admission:   Current Outpatient Prescriptions:  .  albuterol (PROVENTIL) (2.5 MG/3ML) 0.083% nebulizer solution, Take 3 mLs (2.5 mg total) by nebulization every 6 (six) hours as needed for wheezing or shortness of breath., Disp: 360 mL, Rfl: 11 .  aspirin 81 MG tablet, Take 81 mg by mouth daily., Disp: , Rfl:  .  atorvastatin (LIPITOR) 40 MG tablet, Take 1 tablet (40 mg total) by mouth daily at 6 PM., Disp: 30 tablet, Rfl: 0 .  chlorpheniramine-HYDROcodone (TUSSIONEX PENNKINETIC ER) 10-8 MG/5ML SUER, Take 5 mLs by mouth every 12 (twelve) hours as needed for cough. (Patient not taking: Reported on 11/15/2016), Disp: 140 mL, Rfl: 0 .  cyanocobalamin 500 MCG tablet, Take 1,000 mcg by mouth daily., Disp: , Rfl:  .  meloxicam (MOBIC) 15 MG tablet, Take 1 tablet by mouth daily., Disp: , Rfl:  .  oxymetazoline (AFRIN) 0.05 % nasal spray, Place 1 spray into both nostrils as needed for congestion., Disp: , Rfl:  .  pramipexole (MIRAPEX) 0.5 MG tablet, TAKE 1 TABLET BY MOUTH EVERY MORNING AND TAKE 1 TABLET EVERY EVENING, Disp: 180 tablet, Rfl: 3 .  PROAIR HFA 108 (90 BASE) MCG/ACT inhaler, Inhale 2 puffs into the lungs every 6 (six) hours as needed., Disp: , Rfl:  .  QUEtiapine Fumarate (SEROQUEL PO), Take 50 mg by mouth at bedtime., Disp: , Rfl:  .  triamcinolone cream (KENALOG) 0.1 %, APPLY 1 APPLICATION ON THE SKIN TWICE A DAY AS NEEDED, Disp: , Rfl: 2  Past Medical  History: Past Medical History:  Diagnosis Date  . Acute sinusitis, unspecified   . Arthritis   . 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 GERD (gastroesophageal reflux disease)   . Left anterior fascicular block    hx of on ekg  . Mixed hyperlipidemia   . Obstructive chronic bronchitis with exacerbation (HCC)   . Occlusion and stenosis of carotid artery without mention of cerebral infarction    a. 05/2013 Carotid U/S: 1-39% bilat stenosis.  . Other emphysema (HCC)   . 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 Sleep apnea    does not use cpap due to cough  . Stroke (HCC) 05/2015    Tobacco Use: History  Smoking Status  . Former Smoker  . Packs/day: 3.00  . Years: 17.00  . Types: Cigarettes  . Quit date: 08/26/1977  Smokeless Tobacco  . Never Used    Labs: Recent Review Flowsheet Data    Labs for ITP Cardiac and Pulmonary Rehab Latest Ref Rng & Units 10/27/2007 11/01/2008 01/17/2011 06/01/2015   Cholestrol 0 - 200 mg/dL 409 811 914 782   LDLCALC 0 - 99 mg/dL 956(O) 130(Q) 90 657(Q)   HDL >40 mg/dL 46.9 62.9 52.84 58   Trlycerides <150 mg/dL 44 59 13.2 46   Hemoglobin A1c 4.8 - 5.6 % - - - 5.7(H)  Capillary Blood Glucose: No results found for: GLUCAP   ADL UCSD:   Pulmonary Function Assessment:     Pulmonary Function Assessment - 11/15/16 1135      Breath   Bilateral Breath Sounds --  fine crackles R middle with deep inspiration   Shortness of Breath Yes;Limiting activity      Exercise Target Goals: Date: 11/19/16  Exercise Program Goal: Individual exercise prescription set with THRR, safety & activity barriers. Participant demonstrates ability to understand and report RPE using BORG scale, to self-measure pulse accurately, and to acknowledge the importance of the exercise prescription.  Exercise Prescription Goal: Starting with aerobic activity 30 plus minutes a day, 3 days per week  for initial exercise prescription. Provide home exercise prescription and guidelines that participant acknowledges understanding prior to discharge.  Activity Barriers & Risk Stratification:   6 Minute Walk:     6 Minute Walk    Row Name 11/19/16 1612         6 Minute Walk   Phase Initial     Distance 1230 feet     Walk Time 6 minutes     # of Rest Breaks 0     MPH 2.32     METS 2.76     RPE 12     Perceived Dyspnea  0     Symptoms No     Resting HR 88 bpm     Resting BP 138/90     Max Ex. HR 110 bpm     Max Ex. BP 150/70       Interval HR   Baseline HR 88     1 Minute HR 94     2 Minute HR 102     3 Minute HR 96     4 Minute HR 101     5 Minute HR 100     6 Minute HR 110     2 Minute Post HR 100     Interval Heart Rate? Yes       Interval Oxygen   Interval Oxygen? Yes     Baseline Oxygen Saturation % 94 %     Baseline Liters of Oxygen 4 L     1 Minute Oxygen Saturation % 94 %     1 Minute Liters of Oxygen 4 L     2 Minute Oxygen Saturation % 89 %     2 Minute Liters of Oxygen 4 L     3 Minute Oxygen Saturation % 86 %     3 Minute Liters of Oxygen 4 L     4 Minute Oxygen Saturation % 89 %     4 Minute Liters of Oxygen 6 L     5 Minute Oxygen Saturation % 89 %     5 Minute Liters of Oxygen 6 L     6 Minute Oxygen Saturation % 88 %     6 Minute Liters of Oxygen 6 L     2 Minute Post Oxygen Saturation % 93 %     2 Minute Post Liters of Oxygen 6 L        Oxygen Initial Assessment:     Oxygen Initial Assessment - 11/15/16 1100      Home Oxygen   Home Oxygen Device Home Concentrator;E-Tanks   Sleep Oxygen Prescription None   Home Exercise Oxygen Prescription Continuous   Liters per minute 4   Home at Rest Exercise Oxygen Prescription None   Compliance with Home Oxygen  Use No   Comments --  does not wear with exertion as he should     Intervention   Short Term Goals To learn and exhibit compliance with exercise, home and travel O2 prescription;To  learn and understand importance of monitoring SPO2 with pulse oximeter and demonstrate accurate use of the pulse oximeter.;To Learn and understand importance of maintaining oxygen saturations>88%;To learn and demonstrate proper purse lipped breathing techniques or other breathing techniques.;To learn and demonstrate proper use of respiratory medications   Long  Term Goals Exhibits compliance with exercise, home and travel O2 prescription;Maintenance of O2 saturations>88%;Compliance with respiratory medication      Oxygen Re-Evaluation:   Oxygen Discharge (Final Oxygen Re-Evaluation):   Initial Exercise Prescription:     Initial Exercise Prescription - 11/19/16 1600      Date of Initial Exercise RX and Referring Provider   Date 11/19/16   Referring Provider Dr. Kendrick FriesMcQuaid     Oxygen   Oxygen Continuous   Liters 6     Bike   Level 0.5   Minutes 17     NuStep   Level 2   Minutes 17   METs 1.5     Track   Laps 10   Minutes 17     Prescription Details   Frequency (times per week) 2   Duration Progress to 45 minutes of aerobic exercise without signs/symptoms of physical distress     Intensity   THRR 40-80% of Max Heartrate 58-115   Ratings of Perceived Exertion 11-13   Perceived Dyspnea 0-4     Progression   Progression Continue progressive overload as per policy without signs/symptoms or physical distress.     Resistance Training   Training Prescription Yes   Weight blue bands   Reps 10-15      Perform Capillary Blood Glucose checks as needed.  Exercise Prescription Changes:   Exercise Comments:   Exercise Goals and Review:   Exercise Goals Re-Evaluation :   Discharge Exercise Prescription (Final Exercise Prescription Changes):   Nutrition:  Target Goals: Understanding of nutrition guidelines, daily intake of sodium 1500mg , cholesterol 200mg , calories 30% from fat and 7% or less from saturated fats, daily to have 5 or more servings of fruits and  vegetables.  Biometrics:     Pre Biometrics - 11/15/16 1113      Pre Biometrics   Grip Strength 15 kg       Nutrition Therapy Plan and Nutrition Goals:   Nutrition Discharge: Rate Your Plate Scores:   Nutrition Goals Re-Evaluation:   Nutrition Goals Discharge (Final Nutrition Goals Re-Evaluation):   Psychosocial: Target Goals: Acknowledge presence or absence of significant depression and/or stress, maximize coping skills, provide positive support system. Participant is able to verbalize types and ability to use techniques and skills needed for reducing stress and depression.  Initial Review & Psychosocial Screening:     Initial Psych Review & Screening - 11/15/16 1136      Initial Review   Current issues with None Identified     Family Dynamics   Good Support System? Yes     Barriers   Psychosocial barriers to participate in program There are no identifiable barriers or psychosocial needs.     Screening Interventions   Interventions Encouraged to exercise      Quality of Life Scores:   PHQ-9: Recent Review Flowsheet Data    [facility] 11/15/2016   Decreased Interest 0   Down, Depressed, Hopeless 0   PHQ -  2 Score 0     Interpretation of Total Score  Total Score Depression Severity:  1-4 = Minimal depression, 5-9 = Mild depression, 10-14 = Moderate depression, 15-19 = Moderately severe depression, 20-27 = Severe depression   Psychosocial Evaluation and Intervention:     Psychosocial Evaluation - 11/15/16 1144      Psychosocial Evaluation & Interventions   Interventions Encouraged to exercise with the program and follow exercise prescription   Continue Psychosocial Services  No Follow up required      Psychosocial Re-Evaluation:   Psychosocial Discharge (Final Psychosocial Re-Evaluation):   Education: Education Goals: Education classes will be provided on a weekly basis, covering required topics. Participant will state  understanding/return demonstration of topics presented.  Learning Barriers/Preferences:     Learning Barriers/Preferences - 11/15/16 1134      Learning Barriers/Preferences   Learning Barriers None   Learning Preferences Computer/Internet;Group Instruction;Individual Instruction;Verbal Instruction;Written Material      Education Topics: Risk Factor Reduction:  -Group instruction that is supported by a PowerPoint presentation. Instructor discusses the definition of a risk factor, different risk factors for pulmonary disease, and how the heart and lungs work together.     Nutrition for Pulmonary Patient:  -Group instruction provided by PowerPoint slides, verbal discussion, and written materials to support subject matter. The instructor gives an explanation and review of healthy diet recommendations, which includes a discussion on weight management, recommendations for fruit and vegetable consumption, as well as protein, fluid, caffeine, fiber, sodium, sugar, and alcohol. Tips for eating when patients are short of breath are discussed.   Pursed Lip Breathing:  -Group instruction that is supported by demonstration and informational handouts. Instructor discusses the benefits of pursed lip and diaphragmatic breathing and detailed demonstration on how to preform both.     Oxygen Safety:  -Group instruction provided by PowerPoint, verbal discussion, and written material to support subject matter. There is an overview of "What is Oxygen" and "Why do we need it".  Instructor also reviews how to create a safe environment for oxygen use, the importance of using oxygen as prescribed, and the risks of noncompliance. There is a brief discussion on traveling with oxygen and resources the patient may utilize.   Oxygen Equipment:  -Group instruction provided by [facility] Staff utilizing handouts, written materials, and equipment demonstrations.   Signs and Symptoms:  -Group instruction provided  by written material and verbal discussion to support subject matter. Warning signs and symptoms of infection, stroke, and heart attack are reviewed and when to call the physician/911 reinforced. Tips for preventing the spread of infection discussed.   Advanced Directives:  -Group instruction provided by verbal instruction and written material to support subject matter. Instructor reviews Advanced Directive laws and proper instruction for filling out document.   Pulmonary Video:  -Group video education that reviews the importance of medication and oxygen compliance, exercise, good nutrition, pulmonary hygiene, and pursed lip and diaphragmatic breathing for the pulmonary patient.   Exercise for the Pulmonary Patient:  -Group instruction that is supported by a PowerPoint presentation. Instructor discusses benefits of exercise, core components of exercise, frequency, duration, and intensity of an exercise routine, importance of utilizing pulse oximetry during exercise, safety while exercising, and options of places to exercise outside of rehab.     Pulmonary Medications:  -Verbally interactive group education provided by instructor with focus on inhaled medications and proper administration.   Anatomy and Physiology of the Respiratory System and Intimacy:  -Group instruction provided by PowerPoint, verbal  discussion, and written material to support subject matter. Instructor reviews respiratory cycle and anatomical components of the respiratory system and their functions. Instructor also reviews differences in obstructive and restrictive respiratory diseases with examples of each. Intimacy, Sex, and Sexuality differences are reviewed with a discussion on how relationships can change when diagnosed with pulmonary disease. Common sexual concerns are reviewed.   Knowledge Questionnaire Score:   Core Components/Risk Factors/Patient Goals at Admission:     Personal Goals and Risk Factors at  Admission - 11/15/16 1144      Core Components/Risk Factors/Patient Goals on Admission    Weight Management Yes;Obesity   Intervention Weight Management: Develop a combined nutrition and exercise program designed to reach desired caloric intake, while maintaining appropriate intake of nutrient and fiber, sodium and fats, and appropriate energy expenditure required for the weight goal.   Expected Outcomes Short Term: Continue to assess and modify interventions until short term weight is achieved   Improve shortness of breath with ADL's Yes   Intervention Provide education, individualized exercise plan and daily activity instruction to help decrease symptoms of SOB with activities of daily living.   Expected Outcomes Short Term: Achieves a reduction of symptoms when performing activities of daily living.   Develop more efficient breathing techniques such as purse lipped breathing and diaphragmatic breathing; and practicing self-pacing with activity Yes   Intervention Provide education, demonstration and support about specific breathing techniuqes utilized for more efficient breathing. Include techniques such as pursed lipped breathing, diaphragmatic breathing and self-pacing activity.   Expected Outcomes Short Term: Participant will be able to demonstrate and use breathing techniques as needed throughout daily activities.      Core Components/Risk Factors/Patient Goals Review:    Core Components/Risk Factors/Patient Goals at Discharge (Final Review):    ITP Comments:   Comments:

## 2016-11-21 NOTE — Telephone Encounter (Signed)
This is FYI for BQ and Morrie Sheldonshley.

## 2016-11-21 NOTE — Telephone Encounter (Signed)
Patient called and wanted Jeffrey Cobb to know that he has received his Durel SaltsOfev -- he can be reached at [phone] -pr

## 2016-11-27 ENCOUNTER — Telehealth: Payer: Self-pay | Admitting: Pulmonary Disease

## 2016-11-27 NOTE — Telephone Encounter (Signed)
PA intiated for OFEV via [email or web address]  Stated that this has a favorable outcome. [id number] Name: kissler dob;  October 24, 1940   Will route to [facility] for follow up on PA.

## 2016-12-05 ENCOUNTER — Encounter (HOSPITAL_COMMUNITY)
Admission: RE | Admit: 2016-12-05 | Discharge: 2016-12-05 | Disposition: A | Discharge status: Home / Self Care | Payer: Medicare Other | Source: Ambulatory Visit | Attending: Pulmonary Disease | Admitting: Pulmonary Disease

## 2016-12-05 VITALS — Wt 215.2 lb

## 2016-12-05 DIAGNOSIS — J849 Interstitial pulmonary disease, unspecified: Secondary | ICD-10-CM | POA: Insufficient documentation

## 2016-12-05 NOTE — Progress Notes (Signed)
Daily Session Note  Patient Details  Name: Jeffrey Cobb MRN: [id number] Date of Birth: 06/09/1941 Referring Provider:     Pulmonary Rehab Walk Test from 11/19/2016 in [place]  Referring Provider  Dr. [name]      Encounter Date: 12/05/2016  Check In:     Session Check In - 12/05/16 1030      Check-In   Location [facility]   Staff Present Rosebud Poles, RN, BSN;Molly diVincenzo, MS, ACSM RCEP, Exercise Physiologist;Lisa Ysidro Evert, RN;Portia Rollene Rotunda, RN, BSN   Supervising physician immediately available to respond to emergencies [facility] immediately available   Physician(s) Dr. Cathlean Sauer   Medication changes reported     No   Fall or balance concerns reported    No   Tobacco Cessation No Change   Warm-up and Cool-down Performed as group-led instruction   Resistance Training Performed Yes   VAD Patient? No     Pain Assessment   Currently in Pain? No/denies   Multiple Pain Sites No      Capillary Blood Glucose: No results found for this or any previous visit (from the past 24 hour(s)).      Exercise Prescription Changes - 12/05/16 1200      Response to Exercise   Blood Pressure (Admit) 150/110  recheck 144/98   Blood Pressure (Exercise) 144/100   Blood Pressure (Exit) 126/76   Heart Rate (Admit) 91 bpm   Heart Rate (Exercise) 113 bpm   Heart Rate (Exit) 95 bpm   Oxygen Saturation (Admit) 98 %   Oxygen Saturation (Exercise) 88 %   Oxygen Saturation (Exit) 96 %   Rating of Perceived Exertion (Exercise) 10   Perceived Dyspnea (Exercise) 1   Duration Progress to 45 minutes of aerobic exercise without signs/symptoms of physical distress   Intensity --  40-80% HRR     Progression   Progression Continue to progress workloads to maintain intensity without signs/symptoms of physical distress.     Resistance Training   Training Prescription Yes   Weight blue bands   Reps 10-15   Time --  10 minutes     Interval Training   Interval Training No     Oxygen   Oxygen Continuous   Liters 4     NuStep   Level 2   Minutes 17   METs 1.7     Track   Laps 9   Minutes 17      History  Smoking Status  . Former Smoker  . Packs/day: 3.00  . Years: 17.00  . Types: Cigarettes  . Quit date: 08/26/1977  Smokeless Tobacco  . Never Used    Goals Met:  Exercise tolerated well Strength training completed today  Goals Unmet:  Not Applicable  Comments: Service time is from 1030 to [address]    Dr. Rush Farmer is Medical Director for Pulmonary Rehab at [facility].

## 2016-12-10 ENCOUNTER — Encounter (HOSPITAL_COMMUNITY)
Admission: RE | Admit: 2016-12-10 | Discharge: 2016-12-10 | Disposition: A | Discharge status: Home / Self Care | Payer: Medicare Other | Source: Ambulatory Visit | Attending: Pulmonary Disease | Admitting: Pulmonary Disease

## 2016-12-10 ENCOUNTER — Other Ambulatory Visit (INDEPENDENT_AMBULATORY_CARE_PROVIDER_SITE_OTHER): Payer: Medicare Other

## 2016-12-10 ENCOUNTER — Ambulatory Visit (INDEPENDENT_AMBULATORY_CARE_PROVIDER_SITE_OTHER): Payer: Medicare Other | Admitting: Pulmonary Disease

## 2016-12-10 ENCOUNTER — Encounter: Payer: Self-pay | Admitting: Pulmonary Disease

## 2016-12-10 VITALS — BP 154/110 | HR 76 | Ht 67.0 in | Wt 214.0 lb

## 2016-12-10 DIAGNOSIS — Z5181 Encounter for therapeutic drug level monitoring: Secondary | ICD-10-CM | POA: Diagnosis not present

## 2016-12-10 DIAGNOSIS — G4733 Obstructive sleep apnea (adult) (pediatric): Secondary | ICD-10-CM | POA: Diagnosis not present

## 2016-12-10 DIAGNOSIS — J849 Interstitial pulmonary disease, unspecified: Secondary | ICD-10-CM

## 2016-12-10 DIAGNOSIS — J8410 Pulmonary fibrosis, unspecified: Secondary | ICD-10-CM

## 2016-12-10 DIAGNOSIS — Z006 Encounter for examination for normal comparison and control in clinical research program: Secondary | ICD-10-CM

## 2016-12-10 LAB — HEPATIC FUNCTION PANEL
ALT: 16 U/L (ref 0–53)
AST: 21 U/L (ref 0–37)
Albumin: 3.9 g/dL (ref 3.5–5.2)
Alkaline Phosphatase: 76 U/L (ref 39–117)
Bilirubin, Direct: 0.2 mg/dL (ref 0.0–0.3)
TOTAL PROTEIN: 7.3 g/dL (ref 6.0–8.3)
Total Bilirubin: 0.7 mg/dL (ref 0.2–1.2)

## 2016-12-10 NOTE — Progress Notes (Signed)
Subjective:    Patient ID: Jeffrey Cobb, male    DOB: 12/07/1940, 76 y.o.   MRN: [id number]  Synopsis: Former patient of Dr. Shelle Iron who has chronic cough and UIP There has been slow progression on high-resolution CT chest imaging over the years and also very little incline and pulmonary function testing.  Quit smoking in 1979 after 16 years of smoking 2 ppd Failed Gabapentin for cough in 2016 due to dizziness Failed Symbicort, Stiolto and Spiriva in that he felt like they really didn't help symptoms He startedon oxygen in 09/2016  HPI Chief Complaint  Patient presents with  . Follow-up    f.u 6 weeks, on 4L O2 working well for pt, questions-can he have oxygen connected to his CPAP machine for nighttime, clinical study still on the table?, can pt get handicap plackard   He started taking the Ofev two weeks ago (start 11/25/2016).  He says that one day he took an extra dose and he had a lot of nausea and vomiting.  He had some diarrhea last night, none since then.  No belly cramping, upset stomach.  No sunburn.   So far he is tolerating it OK.  He feels comfortable taking the medicine.  In regards to his breathing he says that it isn't bad right now, though if he is working hard on something (extra exertion) he gets dyspneic.  If he has the oxygen on it helps.   He continues to use and benefit from his oxygen.   He is participating in pulmonary rehab right now and doing well with that.    Past Medical History:  Diagnosis Date  . Acute sinusitis, unspecified   . Arthritis   . 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 GERD (gastroesophageal reflux disease)   . Left anterior fascicular block    hx of on ekg  . Mixed hyperlipidemia   . Obstructive chronic bronchitis with exacerbation (HCC)   . Occlusion and stenosis of carotid artery without mention of cerebral infarction    a. 05/2013 Carotid U/S: 1-39% bilat stenosis.  . Other emphysema  (HCC)   . 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 Sleep apnea    does not use cpap due to cough  . Stroke [facility]) 05/2015      Review of Systems  Constitutional: Negative for chills, fatigue and fever.  HENT: Positive for rhinorrhea. Negative for sinus pressure and sneezing.   Respiratory: Positive for cough. Negative for shortness of breath and wheezing.   Cardiovascular: Negative for chest pain, palpitations and leg swelling.       Objective:   Physical Exam Vitals:   12/10/16 1527  BP: (!) 154/110  Pulse: 76  SpO2: 96%  Weight: 214 lb (97.1 kg)  Height:  (1.702 m)   4L pm pulse  Gen: chronically ill appearing HENT: OP clear, TM's clear, neck supple PULM: Crackles bases B, normal percussion CV: RRR, no mgr, trace edema GI: BS+, soft, nontender Derm: no cyanosis or rash Psyche: normal mood and affect     CBC    Component Value Date/Time   WBC 6.7 05/31/2015 1730   RBC 4.64 05/31/2015 1730   HGB 14.6 05/31/2015 1730   HCT 41.5 05/31/2015 1730   PLT 138 (L) 05/31/2015 1730   MCV 89.4 05/31/2015 1730   MCH 31.5 05/31/2015 1730   MCHC 35.2 05/31/2015 1730   RDW 13.4 05/31/2015 1730  LYMPHSABS 2.2 09/02/2014 1204   MONOABS 0.7 09/02/2014 1204   EOSABS 0.2 09/02/2014 1204   BASOSABS 0.0 09/02/2014 1204     PFT PFT"s 2009:  FEV1 3.49 (115%), ratio 65, DLCO 67%. PFT's 08/2014:  FEV1 3.13 (114%), ratio 69, decreased airtrapping from 2009, DLCO 49% June 2017 pulmonary function testing FEV1 2.99 L (111% predicted, FVC 3.67 L (98% predicted), total lung capacity 5.53 L (85% predicted), DLCO 12.76 (45% predicted). March 2018 pulmonary function testing ratio 80%, FVC 2.94 L 79% predicted, total lung capacity 4.51 L 69% predicted, DLCO 9.05 31% predicted  Imaging: 01/2016 HRCT > traction bronchiolectasis and interlobular septal thickening in a basilar predominance have progressed compared to July 2016, there remains no frank  honeycombing. There are multiple pulmonary nodules which are stable or have resolved. February 2018 high-resolution CT chest independently reviewed showing traction bronchiectasis, interlobular septal thickening and honeycombing worse in the periphery and bases.  6 minute walk test: August 2016 6 minute walk test 336 m O2 sats ration 92% on room air 04/27/2017 6 minute walk distance 292.5 m O2 sats ration and completion of test was 82% on room air.  Other testing: September 2017 pH probe showed normal pH and no clear evidence of gastroesophageal reflux.      Assessment & Plan:  OSA on CPAP He has not been using his CPAP machine as frequently, though he needs to do this because of the severity of his lung disease.  Plan: Changed auto titrating CPAP device. Use fullface mask and nose mask alternatively at home to see which one helps the most Add oxygen 2 L/m Check overnight oximetry on CPAP and oxygen  IPF (idiopathic pulmonary fibrosis) (HCC) He has done well with the addition of Ofev. He understands that this is not a curative treatment but only intended to slow the progression of his disease.  I will obtain records from his most recent 6 minute walk test Continue Ofev Check liver function test to ensure there is no evidence of toxicity from this medicine  Chronic respiratory failure with hypoxia (HCC) continue 4 L of oxygen pulse with his portable device, 2 L continuously when he is at home on his concentrator  > 50% of this 29 minute visit spent face to face   Current Outpatient Prescriptions:  .  albuterol (PROVENTIL) (2.5 MG/3ML) 0.083% nebulizer solution, Take 3 mLs (2.5 mg total) by nebulization every 6 (six) hours as needed for wheezing or shortness of breath., Disp: 360 mL, Rfl: 11 .  aspirin 81 MG tablet, Take 81 mg by mouth daily., Disp: , Rfl:  .  atorvastatin (LIPITOR) 40 MG tablet, Take 1 tablet (40 mg total) by mouth daily at 6 PM., Disp: 30 tablet, Rfl: 0 .   chlorpheniramine-HYDROcodone (TUSSIONEX PENNKINETIC ER) 10-8 MG/5ML SUER, Take 5 mLs by mouth every 12 (twelve) hours as needed for cough., Disp: 140 mL, Rfl: 0 .  cyanocobalamin 500 MCG tablet, Take 1,000 mcg by mouth daily., Disp: , Rfl:  .  meloxicam (MOBIC) 15 MG tablet, Take 1 tablet by mouth daily., Disp: , Rfl:  .  oxymetazoline (AFRIN) 0.05 % nasal spray, Place 1 spray into both nostrils as needed for congestion., Disp: , Rfl:  .  pramipexole (MIRAPEX) 0.5 MG tablet, TAKE 1 TABLET BY MOUTH EVERY MORNING AND TAKE 1 TABLET EVERY EVENING, Disp: 180 tablet, Rfl: 3 .  PROAIR HFA 108 (90 BASE) MCG/ACT inhaler, Inhale 2 puffs into the lungs every 6 (six) hours as needed., Disp: ,  Rfl:  .  QUEtiapine Fumarate (SEROQUEL PO), Take 50 mg by mouth at bedtime., Disp: , Rfl:  .  triamcinolone cream (KENALOG) 0.1 %, APPLY 1 APPLICATION ON THE SKIN TWICE A DAY AS NEEDED, Disp: , Rfl: 2

## 2016-12-10 NOTE — Assessment & Plan Note (Signed)
continue 4 L of oxygen pulse with his portable device, 2 L continuously when he is at home on his concentrator

## 2016-12-10 NOTE — Progress Notes (Signed)
IPF PRO Registry Purpose: To collect data and biological samples that will support future research studies.  Registry will describe current approaches to diagnosis and treatment of IPF, analyze participant characteristics to describe the natural history of the disease, assess quality of life, describe participants interactions with the health care system, describe IPF treatment practices across multiple institutions, and utilize biological samples linked to well characterized IPF participants to identify disease biomarkers.  --------------------------------------------------------------------------------- Clinical Research Coordinator / Research RN note : This visit for Subject Jeffrey Cobb with DOB: 1940/12/13 on 12/10/2016 for the above protocol is Enrollment Visit  and is for purpose of research . The consent for this encounter is under Protocol Version Amendment 2date 31/Jan/2017and  is currently IRB approved. Subject expressed continued interest and consent in continuing as a study subject. Subject confirmed that there was  no change in contact information (e.g. address, telephone, email). Subject thanked for participation in research and contribution to science.   All required procedures required per the above mentioned protocol were completed. PRO questionnaires and informed consent documentation checklist are filed in the subjects paper source binder.   Signed by Carron Curie, CMA Clinical Research Coordinator [facility]  [place], [place] 5:23 PM 12/10/2016

## 2016-12-10 NOTE — Assessment & Plan Note (Signed)
He has not been using his CPAP machine as frequently, though he needs to do this because of the severity of his lung disease.  Plan: Changed auto titrating CPAP device. Use fullface mask and nose mask alternatively at home to see which one helps the most Add oxygen 2 L/m Check overnight oximetry on CPAP and oxygen

## 2016-12-10 NOTE — Assessment & Plan Note (Signed)
He has done well with the addition of Ofev. He understands that this is not a curative treatment but only intended to slow the progression of his disease.  I will obtain records from his most recent 6 minute walk test Continue Ofev Check liver function test to ensure there is no evidence of toxicity from this medicine

## 2016-12-10 NOTE — Patient Instructions (Addendum)
Keep taking Ofev as you are doing Keep using oxygen as your doing We will change your CPAP machine to an auto titrating device, use both the full face mask and the nasal masks and see which works better for you We will call you with the results of today's blood work We will see you back in 6 weeks or sooner if needed

## 2016-12-12 ENCOUNTER — Telehealth: Payer: Self-pay | Admitting: Pulmonary Disease

## 2016-12-12 ENCOUNTER — Encounter (HOSPITAL_COMMUNITY)
Admission: RE | Admit: 2016-12-12 | Discharge: 2016-12-12 | Disposition: A | Discharge status: Home / Self Care | Payer: Medicare Other | Source: Ambulatory Visit | Attending: Pulmonary Disease | Admitting: Pulmonary Disease

## 2016-12-12 VITALS — Wt 214.7 lb

## 2016-12-12 DIAGNOSIS — J849 Interstitial pulmonary disease, unspecified: Secondary | ICD-10-CM

## 2016-12-12 NOTE — Progress Notes (Signed)
Daily Session Note  Patient Details  Name: Jeffrey Cobb MRN: [id number] Date of Birth: [birth year] Referring Provider:     Pulmonary Rehab Walk Test from 11/19/2016 in [place]  Referring Provider  Dr. [name]      Encounter Date: 12/12/2016  Check In:     Session Check In - 12/12/16 1030      Check-In   Location [facility]   Staff Present Rosebud Poles, RN, BSN;Molly diVincenzo, MS, ACSM RCEP, Exercise Physiologist;Portia Rollene Rotunda, RN, BSN   Supervising physician immediately available to respond to emergencies [facility] immediately available   Physician(s) Dr. Candiss Norse   Medication changes reported     No   Fall or balance concerns reported    No   Tobacco Cessation No Change   Warm-up and Cool-down Performed as group-led instruction   Resistance Training Performed Yes   VAD Patient? No     Pain Assessment   Currently in Pain? No/denies   Multiple Pain Sites No      Capillary Blood Glucose: No results found for this or any previous visit (from the past 24 hour(s)).      Exercise Prescription Changes - 12/12/16 1200      Response to Exercise   Blood Pressure (Admit) 132/90   Blood Pressure (Exercise) 130/80   Blood Pressure (Exit) 134/90   Heart Rate (Admit) 84 bpm   Heart Rate (Exercise) 74 bpm   Heart Rate (Exit) 77 bpm   Oxygen Saturation (Admit) 97 %   Oxygen Saturation (Exercise) 94 %   Oxygen Saturation (Exit) 94 %   Rating of Perceived Exertion (Exercise) 13   Perceived Dyspnea (Exercise) 2   Duration Progress to 45 minutes of aerobic exercise without signs/symptoms of physical distress     Progression   Progression Continue to progress workloads to maintain intensity without signs/symptoms of physical distress.     Resistance Training   Training Prescription Yes   Weight blue bands   Reps 10-15   Time --  10 minutes     NuStep   Level 2   Minutes 17   METs 1.7     Track   Laps 10   Minutes 17      History  Smoking Status  . Former Smoker  . Packs/day: 3.00  . Years: 17.00  . Types: Cigarettes  . Quit date: 08/26/1977  Smokeless Tobacco  . Never Used    Goals Met:  Exercise tolerated well Strength training completed today  Goals Unmet:  Not Applicable  Comments: Service time is from 1030 to 1220    Dr. Rush Farmer is Medical Director for Pulmonary Rehab at [facility]

## 2016-12-12 NOTE — Telephone Encounter (Signed)
Results have been explained to patient, pt expressed understanding. Nothing further needed.   Notes recorded by Lupita Leash, MD on 12/11/2016 at 8:50 AM EDT A, Please let the patient know this was OK Thanks, B    Ref Range & Units 2d ago 10mo ago   Total Bilirubin 0.2 - 1.2 mg/dL 0.7  0.7    Bilirubin, Direct 0.0 - 0.3 mg/dL 0.2  0.2    Alkaline Phosphatase 39 - 117 U/L 76  73    AST 0 - 37 U/L 21  22    ALT 0 - 53 U/L 16  16    Total Protein 6.0 - 8.3 g/dL 7.3  7.5    Albumin 3.5 - 5.2 g/dL 3.9  4.1

## 2016-12-17 ENCOUNTER — Encounter (HOSPITAL_COMMUNITY)
Admission: RE | Admit: 2016-12-17 | Discharge: 2016-12-17 | Disposition: A | Discharge status: Home / Self Care | Payer: Medicare Other | Source: Ambulatory Visit | Attending: Pulmonary Disease | Admitting: Pulmonary Disease

## 2016-12-17 ENCOUNTER — Encounter (HOSPITAL_COMMUNITY): Payer: Self-pay

## 2016-12-17 VITALS — Wt 214.3 lb

## 2016-12-17 DIAGNOSIS — J849 Interstitial pulmonary disease, unspecified: Secondary | ICD-10-CM

## 2016-12-17 NOTE — Progress Notes (Signed)
Daily Session Note  Patient Details  Name: Jeffrey Cobb MRN: [id number] Date of Birth: 10/28/1940 Referring Provider:     Pulmonary Rehab Walk Test from 11/19/2016 in [place]  Referring Provider  Dr. [name]      Encounter Date: 12/17/2016  Check In:     Session Check In - 12/17/16 1021      Check-In   [facility]   Staff Present Rosebud Poles, RN, BSN;Molly diVincenzo, MS, ACSM RCEP, Exercise Physiologist;Portia Rollene Rotunda, RN, Roque Cash, RN   Supervising physician immediately available to respond to emergencies [facility] immediately available   Physician(s) Dr. Candiss Norse   Medication changes reported     No   Fall or balance concerns reported    No   Tobacco Cessation No Change   Warm-up and Cool-down Performed as group-led instruction   Resistance Training Performed Yes   VAD Patient? No     Pain Assessment   Currently in Pain? No/denies      Capillary Blood Glucose: No results found for this or any previous visit (from the past 24 hour(s)).      Exercise Prescription Changes - 12/17/16 1200      Response to Exercise   Blood Pressure (Admit) 144/90   Blood Pressure (Exercise) 140/92   Blood Pressure (Exit) 138/96   Heart Rate (Admit) 86 bpm   Heart Rate (Exercise) 109 bpm   Heart Rate (Exit) 91 bpm   Oxygen Saturation (Admit) 93 %   Oxygen Saturation (Exercise) 90 %   Oxygen Saturation (Exit) 89 %   Rating of Perceived Exertion (Exercise) 13   Perceived Dyspnea (Exercise) 1   Duration Continue with 45 min of aerobic exercise without signs/symptoms of physical distress.   Intensity THRR unchanged     Progression   Progression Continue to progress workloads to maintain intensity without signs/symptoms of physical distress.     Resistance Training   Training Prescription Yes   Weight blue bands   Reps 10-15   Time 10 Minutes     Oxygen   Oxygen Continuous   Liters 4     Bike   Level 0.5   Minutes 17     NuStep   Level 3   Minutes 17   METs 2      History  Smoking Status  . Former Smoker  . Packs/day: 3.00  . Years: 17.00  . Types: Cigarettes  . Quit date: 08/26/1977  Smokeless Tobacco  . Never Used    Goals Met:  Exercise tolerated well No report of cardiac concerns or symptoms Strength training completed today  Goals Unmet:  Not Applicable  Comments: Service time is from 1030 to 1200    Dr. Rush Farmer is Medical Director for Pulmonary Rehab at [facility].

## 2016-12-17 NOTE — Progress Notes (Signed)
Jeffrey Cobb 76 y.o. male Nutrition Note Spoke with pt. Pt is obese. There are some ways the pt can make his eating habits healthier.  Pt's Rate Your Plate results reviewed with pt. Pt does not avoid salty food; uses canned/ convenience food.  Pt adds salt to food.  The role of sodium in lung disease reviewed with pt. Pt expressed understanding of the information reviewed via feedback method.    Lab Results  Component Value Date   HGBA1C 5.7 (H) 06/01/2015   Nutrition Diagnosis ? Food-and nutrition-related knowledge deficit related to lack of exposure to information as related to diagnosis of pulmonary disease ? Obesity related to excessive energy intake as evidenced by a BMI of 33.6  Nutrition Intervention ? Pt's individual nutrition plan and goals reviewed with pt. ? Benefits of adopting healthy eating habits discussed when pt's Rate Your Plate reviewed. ? Pt to attend the Nutrition and Lung Disease class ? Handouts given for 1500 kcal, 5 day menu idea ? Continual client-centered nutrition education by RD, as part of interdisciplinary care. Goal(s) 1. Pt to identify and limit food sources of sodium. 2. Identify food quantities necessary to achieve wt loss of  -2# per week to a goal wt loss of 2.7-10.9 kg (6-24 lb) at graduation from pulmonary rehab. 3. Describe the benefit of including fruits, vegetables, whole grains, and low-fat dairy products in a healthy meal plan. Monitor and Evaluate progress toward nutrition goal with team.   Mickle Plumb, M.Ed, RD, LDN, CDE 12/17/2016 12:32 PM

## 2016-12-19 ENCOUNTER — Encounter (HOSPITAL_COMMUNITY)
Admission: RE | Admit: 2016-12-19 | Discharge: 2016-12-19 | Disposition: A | Discharge status: Home / Self Care | Payer: Medicare Other | Source: Ambulatory Visit | Attending: Pulmonary Disease | Admitting: Pulmonary Disease

## 2016-12-19 ENCOUNTER — Other Ambulatory Visit: Payer: Self-pay

## 2016-12-19 VITALS — Wt 215.6 lb

## 2016-12-19 DIAGNOSIS — J849 Interstitial pulmonary disease, unspecified: Secondary | ICD-10-CM | POA: Diagnosis not present

## 2016-12-19 NOTE — Progress Notes (Signed)
Daily Session Note  Patient Details  Name: Jeffrey Cobb MRN: [id number] Date of Birth: Mar 20, 1941 Referring Provider:     Pulmonary Rehab Walk Test from 11/19/2016 in [place]  Referring Provider  Dr. [name]      Encounter Date: 12/19/2016  Check In:     Session Check In - 12/19/16 1101      Check-In   [facility]   Staff Present Trish Fountain, RN, Maxcine Ham, RN, BSN;Lisa Hughes, RN;Corleen Otwell, MS, ACSM RCEP, Exercise Physiologist   Supervising physician immediately available to respond to emergencies [facility] immediately available   Physician(s) Dr. Posey Pronto   Medication changes reported     No   Fall or balance concerns reported    No   Tobacco Cessation No Change   Warm-up and Cool-down Performed as group-led instruction   Resistance Training Performed Yes   VAD Patient? No     Pain Assessment   Currently in Pain? No/denies   Multiple Pain Sites No      Capillary Blood Glucose: No results found for this or any previous visit (from the past 24 hour(s)).      Exercise Prescription Changes - 12/19/16 1200      Response to Exercise   Blood Pressure (Admit) 130/92   Blood Pressure (Exercise) 144/90   Blood Pressure (Exit) 140/82   Heart Rate (Admit) 90 bpm   Heart Rate (Exercise) 103 bpm   Heart Rate (Exit) 93 bpm   Oxygen Saturation (Admit) 90 %   Oxygen Saturation (Exercise) 90 %   Oxygen Saturation (Exit) 89 %   Rating of Perceived Exertion (Exercise) 13   Perceived Dyspnea (Exercise) 1   Duration Continue with 45 min of aerobic exercise without signs/symptoms of physical distress.   Intensity THRR unchanged     Progression   Progression Continue to progress workloads to maintain intensity without signs/symptoms of physical distress.     Resistance Training   Training Prescription Yes   Weight blue bands   Reps 10-15   Time 10 Minutes     Oxygen   Oxygen Continuous   Liters 4     NuStep   Level 3   Minutes 17   METs 2.2     Track   Laps 4   Minutes 17      History  Smoking Status  . Former Smoker  . Packs/day: 3.00  . Years: 17.00  . Types: Cigarettes  . Quit date: 08/26/1977  Smokeless Tobacco  . Never Used    Goals Met:  Exercise tolerated well No report of cardiac concerns or symptoms Strength training completed today  Goals Unmet:  Not Applicable  Comments: Service time is from 10:30a to 12:35p   Patient attended education today with Dr. Nelda Marseille  Dr. Rush Farmer is Medical Director for Pulmonary Rehab at [facility].

## 2016-12-24 ENCOUNTER — Encounter (HOSPITAL_COMMUNITY)
Admission: RE | Admit: 2016-12-24 | Discharge: 2016-12-24 | Disposition: A | Discharge status: Home / Self Care | Payer: Medicare Other | Source: Ambulatory Visit | Attending: Pulmonary Disease | Admitting: Pulmonary Disease

## 2016-12-24 VITALS — Wt 214.9 lb

## 2016-12-24 DIAGNOSIS — J849 Interstitial pulmonary disease, unspecified: Secondary | ICD-10-CM

## 2016-12-24 NOTE — Progress Notes (Signed)
Daily Session Note  Patient Details  Name: Jeffrey Cobb MRN: [id number] Date of Birth: 07/29/1941 Referring Provider:     Pulmonary Rehab Walk Test from 11/19/2016 in [place]  Referring Provider  Dr. [name]      Encounter Date: 12/24/2016  Check In:     Session Check In - 12/24/16 1030      Check-In   [facility]   Staff Present Rosebud Poles, RN, BSN;Molly diVincenzo, MS, ACSM RCEP, Exercise Physiologist;Callyn Severtson Ysidro Evert, RN;Portia Rollene Rotunda, RN, BSN   Supervising physician immediately available to respond to emergencies [facility] immediately available   Physician(s) Dr. Posey Pronto   Medication changes reported     No   Fall or balance concerns reported    No   Tobacco Cessation No Change   Warm-up and Cool-down Performed as group-led instruction   Resistance Training Performed Yes   VAD Patient? No     Pain Assessment   Currently in Pain? No/denies   Multiple Pain Sites No      Capillary Blood Glucose: No results found for this or any previous visit (from the past 24 hour(s)).      Exercise Prescription Changes - 12/24/16 1200      Response to Exercise   Blood Pressure (Admit) 130/92   Blood Pressure (Exercise) 150/80   Blood Pressure (Exit) 130/86   Heart Rate (Admit) 87 bpm   Heart Rate (Exercise) 93 bpm   Heart Rate (Exit) 92 bpm   Oxygen Saturation (Admit) 97 %   Oxygen Saturation (Exercise) 91 %   Oxygen Saturation (Exit) 92 %   Rating of Perceived Exertion (Exercise) 13   Perceived Dyspnea (Exercise) 2   Duration Continue with 45 min of aerobic exercise without signs/symptoms of physical distress.   Intensity THRR unchanged     Progression   Progression Continue to progress workloads to maintain intensity without signs/symptoms of physical distress.     Resistance Training   Training Prescription Yes   Weight blue bands   Reps 10-15   Time 10 Minutes     Oxygen   Oxygen Continuous   Liters 4     NuStep   Level 5   Minutes 34   METs 2.3     Track   Laps 10   Minutes 17      History  Smoking Status  . Former Smoker  . Packs/day: 3.00  . Years: 17.00  . Types: Cigarettes  . Quit date: 08/26/1977  Smokeless Tobacco  . Never Used    Goals Met:  No report of cardiac concerns or symptoms Strength training completed today  Goals Unmet:  Not Applicable  Comments: Service time is from 1030 to 1210    Dr. Rush Farmer is Medical Director for Pulmonary Rehab at [facility]

## 2016-12-26 ENCOUNTER — Encounter (HOSPITAL_COMMUNITY)
Admission: RE | Admit: 2016-12-26 | Discharge: 2016-12-26 | Disposition: A | Discharge status: Home / Self Care | Payer: Medicare Other | Source: Ambulatory Visit | Attending: Pulmonary Disease | Admitting: Pulmonary Disease

## 2016-12-26 VITALS — Wt 212.5 lb

## 2016-12-26 DIAGNOSIS — J849 Interstitial pulmonary disease, unspecified: Secondary | ICD-10-CM | POA: Diagnosis not present

## 2016-12-26 NOTE — Progress Notes (Signed)
Daily Session Note  Patient Details  Name: Jeffrey Cobb MRN: [id number] Date of Birth: [birth year] Referring Provider:     Pulmonary Rehab Walk Test from 11/19/2016 in [place]  Referring Provider  Dr. [name]      Encounter Date: 12/26/2016  Check In:     Session Check In - 12/26/16 1105      Check-In   Location [facility]   Staff Present Trish Fountain, RN, Maxcine Ham, RN, Roque Cash, RN   Supervising physician immediately available to respond to emergencies [facility] immediately available   Physician(s) Dr. Sloan Leiter   Medication changes reported     No   Fall or balance concerns reported    No   Tobacco Cessation No Change   Warm-up and Cool-down Performed as group-led instruction   Resistance Training Performed Yes   VAD Patient? No     Pain Assessment   Currently in Pain? No/denies   Multiple Pain Sites No      Capillary Blood Glucose: No results found for this or any previous visit (from the past 24 hour(s)).      Exercise Prescription Changes - 12/26/16 1234      Response to Exercise   Blood Pressure (Admit) 122/86   Blood Pressure (Exercise) 146/80   Blood Pressure (Exit) 104/70   Heart Rate (Admit) 87 bpm   Heart Rate (Exercise) 106 bpm   Heart Rate (Exit) 93 bpm   Oxygen Saturation (Admit) 98 %   Oxygen Saturation (Exercise) 89 %   Oxygen Saturation (Exit) 97 %   Rating of Perceived Exertion (Exercise) 13   Perceived Dyspnea (Exercise) 1   Duration Continue with 45 min of aerobic exercise without signs/symptoms of physical distress.   Intensity THRR unchanged     Progression   Progression Continue to progress workloads to maintain intensity without signs/symptoms of physical distress.     Resistance Training   Training Prescription Yes   Weight blue bands   Reps 10-15   Time 10 Minutes     Oxygen   Oxygen Continuous   Liters 4     Bike   Level 0.5   Minutes 17     NuStep   Level 5   Minutes 34   METs 2.3      History  Smoking Status  . Former Smoker  . Packs/day: 3.00  . Years: 17.00  . Types: Cigarettes  . Quit date: 08/26/1977  Smokeless Tobacco  . Never Used    Goals Met:  Exercise tolerated well Queuing for purse lip breathing No report of cardiac concerns or symptoms Strength training completed today  Goals Unmet:  Not Applicable  Comments: Service time is from 1030 to 1220   Dr. Rush Farmer is Medical Director for Pulmonary Rehab at [facility].

## 2016-12-31 ENCOUNTER — Encounter (HOSPITAL_COMMUNITY)
Admission: RE | Admit: 2016-12-31 | Discharge: 2016-12-31 | Disposition: A | Discharge status: Home / Self Care | Payer: Medicare Other | Source: Ambulatory Visit | Attending: Pulmonary Disease | Admitting: Pulmonary Disease

## 2016-12-31 VITALS — Wt 217.8 lb

## 2016-12-31 DIAGNOSIS — J849 Interstitial pulmonary disease, unspecified: Secondary | ICD-10-CM | POA: Diagnosis not present

## 2016-12-31 NOTE — Progress Notes (Signed)
Pulmonary Individual Treatment Plan  Patient Details  Name: Jeffrey Cobb MRN: [id number] Date of Birth: [birth year] Referring Provider:     Pulmonary Rehab Walk Test from 11/19/2016 in [place]  Referring Provider  Dr. [name]      Initial Encounter Date:    Pulmonary Rehab Walk Test from 11/19/2016 in [place]  Date  11/19/16  Referring Provider  Dr. [name]      Visit Diagnosis: ILD (interstitial lung disease) ([place])  Patient's Home Medications on Admission:   Current Outpatient Prescriptions:  .  albuterol (PROVENTIL) (2.5 MG/3ML) 0.083% nebulizer solution, Take 3 mLs (2.5 mg total) by nebulization every 6 (six) hours as needed for wheezing or shortness of breath., Disp: 360 mL, Rfl: 11 .  aspirin 81 MG tablet, Take 81 mg by mouth daily., Disp: , Rfl:  .  atorvastatin (LIPITOR) 40 MG tablet, Take 1 tablet (40 mg total) by mouth daily at 6 PM., Disp: 30 tablet, Rfl: 0 .  chlorpheniramine-HYDROcodone (TUSSIONEX PENNKINETIC ER) 10-8 MG/5ML SUER, Take 5 mLs by mouth every 12 (twelve) hours as needed for cough., Disp: 140 mL, Rfl: 0 .  cyanocobalamin 500 MCG tablet, Take 1,000 mcg by mouth daily., Disp: , Rfl:  .  meloxicam (MOBIC) 15 MG tablet, Take 1 tablet by mouth daily., Disp: , Rfl:  .  Nintedanib (OFEV) 150 MG CAPS, Take 150 mg by mouth 2 (two) times daily., Disp: 60 capsule, Rfl:  .  oxymetazoline (AFRIN) 0.05 % nasal spray, Place 1 spray into both nostrils as needed for congestion., Disp: , Rfl:  .  pramipexole (MIRAPEX) 0.5 MG tablet, TAKE 1 TABLET BY MOUTH EVERY MORNING AND TAKE 1 TABLET EVERY EVENING, Disp: 180 tablet, Rfl: 3 .  PROAIR HFA 108 (90 BASE) MCG/ACT inhaler, Inhale 2 puffs into the lungs every 6 (six) hours as needed., Disp: , Rfl:  .  QUEtiapine Fumarate (SEROQUEL PO), Take 50 mg by mouth at bedtime., Disp: , Rfl:  .  triamcinolone cream (KENALOG) 0.1 %, APPLY 1 APPLICATION ON THE SKIN TWICE A  DAY AS NEEDED, Disp: , Rfl: 2  Past Medical History: Past Medical History:  Diagnosis Date  . Acute sinusitis, unspecified   . Arthritis   . 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 GERD (gastroesophageal reflux disease)   . Left anterior fascicular block    hx of on ekg  . Mixed hyperlipidemia   . Obstructive chronic bronchitis with exacerbation ([place])   . Occlusion and stenosis of carotid artery without mention of cerebral infarction    a. 05/2013 Carotid U/S: 1-39% bilat stenosis.  . Other emphysema ([place])   . 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 Sleep apnea    does not use cpap due to cough  . Stroke ([place]) 05/2015    Tobacco Use: History  Smoking Status  . Former Smoker  . Packs/day: 3.00  . Years: 17.00  . Types: Cigarettes  . Quit date: 08/26/1977  Smokeless Tobacco  . Never Used    Labs: Recent Review Flowsheet Data    Labs for ITP Cardiac and Pulmonary Rehab Latest Ref Rng & Units 10/27/2007 11/01/2008 01/17/2011 06/01/2015   Cholestrol 0 - 200 mg/dL 175 169 165 170   LDLCALC 0 - 99 mg/dL 108(H) 105(H) 90 103(H)   HDL >40 mg/dL 57.8 52.5 67.40 58   Trlycerides <150 mg/dL 44 59 40.0  46   Hemoglobin A1c 4.8 - 5.6 % - - - 5.7(H)      Capillary Blood Glucose: No results found for: GLUCAP   ADL UCSD:     Pulmonary Assessment Scores    Row Name 12/05/16 1558         ADL UCSD   ADL Phase Entry     SOB Score total 100        Pulmonary Function Assessment:     Pulmonary Function Assessment - 11/15/16 1135      Breath   Bilateral Breath Sounds --  fine crackles R middle with deep inspiration   Shortness of Breath Yes;Limiting activity      Exercise Target Goals:    Exercise Program Goal: Individual exercise prescription set with THRR, safety & activity barriers. Participant demonstrates ability to understand and report RPE using BORG scale, to self-measure pulse accurately,  and to acknowledge the importance of the exercise prescription.  Exercise Prescription Goal: Starting with aerobic activity 30 plus minutes a day, 3 days per week for initial exercise prescription. Provide home exercise prescription and guidelines that participant acknowledges understanding prior to discharge.  Activity Barriers & Risk Stratification:   6 Minute Walk:     6 Minute Walk    Row Name 11/19/16 1612         6 Minute Walk   Phase Initial     Distance 1230 feet     Walk Time 6 minutes     # of Rest Breaks 0     MPH 2.32     METS 2.76     RPE 12     Perceived Dyspnea  0     Symptoms No     Resting HR 88 bpm     Resting BP 138/90     Max Ex. HR 110 bpm     Max Ex. BP 150/70       Interval HR   Baseline HR 88     1 Minute HR 94     2 Minute HR 102     3 Minute HR 96     4 Minute HR 101     5 Minute HR 100     6 Minute HR 110     2 Minute Post HR 100     Interval Heart Rate? Yes       Interval Oxygen   Interval Oxygen? Yes     Baseline Oxygen Saturation % 94 %     Baseline Liters of Oxygen 4 L     1 Minute Oxygen Saturation % 94 %     1 Minute Liters of Oxygen 4 L     2 Minute Oxygen Saturation % 89 %     2 Minute Liters of Oxygen 4 L     3 Minute Oxygen Saturation % 86 %     3 Minute Liters of Oxygen 4 L     4 Minute Oxygen Saturation % 89 %     4 Minute Liters of Oxygen 6 L     5 Minute Oxygen Saturation % 89 %     5 Minute Liters of Oxygen 6 L     6 Minute Oxygen Saturation % 88 %     6 Minute Liters of Oxygen 6 L     2 Minute Post Oxygen Saturation % 93 %     2 Minute Post Liters of Oxygen 6 L        Oxygen  Initial Assessment:     Oxygen Initial Assessment - 11/15/16 1100      Home Oxygen   Home Oxygen Device Home Concentrator;E-Tanks   Sleep Oxygen Prescription None   Home Exercise Oxygen Prescription Continuous   Liters per minute 4   Home at Rest Exercise Oxygen Prescription None   Compliance with Home Oxygen Use No   Comments --   does not wear with exertion as he should     Intervention   Short Term Goals To learn and exhibit compliance with exercise, home and travel O2 prescription;To learn and understand importance of monitoring SPO2 with pulse oximeter and demonstrate accurate use of the pulse oximeter.;To Learn and understand importance of maintaining oxygen saturations>88%;To learn and demonstrate proper purse lipped breathing techniques or other breathing techniques.;To learn and demonstrate proper use of respiratory medications   Long  Term Goals Exhibits compliance with exercise, home and travel O2 prescription;Maintenance of O2 saturations>88%;Compliance with respiratory medication      Oxygen Re-Evaluation:     Oxygen Re-Evaluation    Row Name 12/31/16 0827             Program Oxygen Prescription   Program Oxygen Prescription Continuous       Liters per minute 6         Home Oxygen   Home Oxygen Device Home Concentrator;E-Tanks       Sleep Oxygen Prescription None       Home Exercise Oxygen Prescription Continuous       Liters per minute 4       Home at Rest Exercise Oxygen Prescription None       Compliance with Home Oxygen Use Yes       Comments -  patient now compliant with home oxygen prescription         Goals/Expected Outcomes   Short Term Goals To learn and exhibit compliance with exercise, home and travel O2 prescription;To learn and understand importance of monitoring SPO2 with pulse oximeter and demonstrate accurate use of the pulse oximeter.;To Learn and understand importance of maintaining oxygen saturations>88%;To learn and demonstrate proper purse lipped breathing techniques or other breathing techniques.;To learn and demonstrate proper use of respiratory medications       Long  Term Goals Exhibits compliance with exercise, home and travel O2 prescription;Maintenance of O2 saturations>88%;Compliance with respiratory medication          Oxygen Discharge (Final Oxygen  Re-Evaluation):     Oxygen Re-Evaluation - 12/31/16 0827      Program Oxygen Prescription   Program Oxygen Prescription Continuous   Liters per minute 6     Home Oxygen   Home Oxygen Device Home Concentrator;E-Tanks   Sleep Oxygen Prescription None   Home Exercise Oxygen Prescription Continuous   Liters per minute 4   Home at Rest Exercise Oxygen Prescription None   Compliance with Home Oxygen Use Yes   Comments --  patient now compliant with home oxygen prescription     Goals/Expected Outcomes   Short Term Goals To learn and exhibit compliance with exercise, home and travel O2 prescription;To learn and understand importance of monitoring SPO2 with pulse oximeter and demonstrate accurate use of the pulse oximeter.;To Learn and understand importance of maintaining oxygen saturations>88%;To learn and demonstrate proper purse lipped breathing techniques or other breathing techniques.;To learn and demonstrate proper use of respiratory medications   Long  Term Goals Exhibits compliance with exercise, home and travel O2 prescription;Maintenance of O2 saturations>88%;Compliance with respiratory medication  Initial Exercise Prescription:     Initial Exercise Prescription - 11/19/16 1600      Date of Initial Exercise RX and Referring Provider   Date 11/19/16   Referring Provider Dr. [name]     Oxygen   Oxygen Continuous   Liters 6     Bike   Level 0.5   Minutes 17     NuStep   Level 2   Minutes 17   METs 1.5     Track   Laps 10   Minutes 17     Prescription Details   Frequency (times per week) 2   Duration Progress to 45 minutes of aerobic exercise without signs/symptoms of physical distress     Intensity   THRR 40-80% of Max Heartrate 58-115   Ratings of Perceived Exertion 11-13   Perceived Dyspnea 0-4     Progression   Progression Continue progressive overload as per policy without signs/symptoms or physical distress.     Resistance Training    Training Prescription Yes   Weight blue bands   Reps 10-15      Perform Capillary Blood Glucose checks as needed.  Exercise Prescription Changes:     Exercise Prescription Changes    Row Name 12/05/16 1200 12/12/16 1200 12/17/16 1200 12/19/16 1200 12/24/16 1200     Response to Exercise   Blood Pressure (Admit) 150/110  recheck 144/98 132/90 144/90 130/92 130/92   Blood Pressure (Exercise) 144/100 130/80 140/92 144/90 150/80   Blood Pressure (Exit) 126/76 134/90 138/96 140/82 130/86   Heart Rate (Admit) 91 bpm 84 bpm 86 bpm 90 bpm 87 bpm   Heart Rate (Exercise) 113 bpm 74 bpm 109 bpm 103 bpm 93 bpm   Heart Rate (Exit) 95 bpm 77 bpm 91 bpm 93 bpm 92 bpm   Oxygen Saturation (Admit) 98 % 97 % 93 % 90 % 97 %   Oxygen Saturation (Exercise) 88 % 94 % 90 % 90 % 91 %   Oxygen Saturation (Exit) 96 % 94 % 89 % 89 % 92 %   Rating of Perceived Exertion (Exercise) 10 13 13 13 13    Perceived Dyspnea (Exercise) 1 2 1 1 2    Duration Progress to 45 minutes of aerobic exercise without signs/symptoms of physical distress Progress to 45 minutes of aerobic exercise without signs/symptoms of physical distress Continue with 45 min of aerobic exercise without signs/symptoms of physical distress. Continue with 45 min of aerobic exercise without signs/symptoms of physical distress. Continue with 45 min of aerobic exercise without signs/symptoms of physical distress.   Intensity -  40-80% HRR  - THRR unchanged THRR unchanged THRR unchanged     Progression   Progression Continue to progress workloads to maintain intensity without signs/symptoms of physical distress. Continue to progress workloads to maintain intensity without signs/symptoms of physical distress. Continue to progress workloads to maintain intensity without signs/symptoms of physical distress. Continue to progress workloads to maintain intensity without signs/symptoms of physical distress. Continue to progress workloads to maintain intensity  without signs/symptoms of physical distress.     Resistance Training   Training Prescription Yes Yes Yes Yes Yes   Weight blue bands blue bands blue bands blue bands blue bands   Reps 10-15 10-15 10-15 10-15 10-15   Time -  10 minutes -  10 minutes 10 Minutes 10 Minutes 10 Minutes     Interval Training   Interval Training No  -  -  -  -     Oxygen  Oxygen Continuous  - Continuous Continuous Continuous   Liters 4  - 4 4 4      Bike   Level  -  - 0.5  -  -   Minutes  -  - 17  -  -     NuStep   Level 2 2 3 3 5    Minutes 17 17 17 17  34   METs 1.7 1.7 2 2.2 2.3     Track   Laps 9 10  - 4 10   Minutes 17 17  - 17 17   Row Name 12/26/16 1234             Response to Exercise   Blood Pressure (Admit) 122/86       Blood Pressure (Exercise) 146/80       Blood Pressure (Exit) 104/70       Heart Rate (Admit) 87 bpm       Heart Rate (Exercise) 106 bpm       Heart Rate (Exit) 93 bpm       Oxygen Saturation (Admit) 98 %       Oxygen Saturation (Exercise) 89 %       Oxygen Saturation (Exit) 97 %       Rating of Perceived Exertion (Exercise) 13       Perceived Dyspnea (Exercise) 1       Duration Continue with 45 min of aerobic exercise without signs/symptoms of physical distress.       Intensity THRR unchanged         Progression   Progression Continue to progress workloads to maintain intensity without signs/symptoms of physical distress.         Resistance Training   Training Prescription Yes       Weight blue bands       Reps 10-15       Time 10 Minutes         Oxygen   Oxygen Continuous       Liters 4         Bike   Level 0.5       Minutes 17         NuStep   Level 5       Minutes 34       METs 2.3          Exercise Comments:   Exercise Goals and Review:   Exercise Goals Re-Evaluation :     Exercise Goals Re-Evaluation    Row Name 12/30/16 0707             Exercise Goal Re-Evaluation   Exercise Goals Review Increase Strenth and  Stamina;Increase Physical Activity       Comments Patient has had slow start due to bouts of hypertension and shoulder injury from fall. Will cont. to monitor and progress as appropriate.        Expected Outcomes Through exercising at rehab and at home, patient will increase physical strength and stamina and find ADL's easier to perform.           Discharge Exercise Prescription (Final Exercise Prescription Changes):     Exercise Prescription Changes - 12/26/16 1234      Response to Exercise   Blood Pressure (Admit) 122/86   Blood Pressure (Exercise) 146/80   Blood Pressure (Exit) 104/70   Heart Rate (Admit) 87 bpm   Heart Rate (Exercise) 106 bpm   Heart Rate (Exit) 93 bpm   Oxygen Saturation (Admit) 98 %  Oxygen Saturation (Exercise) 89 %   Oxygen Saturation (Exit) 97 %   Rating of Perceived Exertion (Exercise) 13   Perceived Dyspnea (Exercise) 1   Duration Continue with 45 min of aerobic exercise without signs/symptoms of physical distress.   Intensity THRR unchanged     Progression   Progression Continue to progress workloads to maintain intensity without signs/symptoms of physical distress.     Resistance Training   Training Prescription Yes   Weight blue bands   Reps 10-15   Time 10 Minutes     Oxygen   Oxygen Continuous   Liters 4     Bike   Level 0.5   Minutes 17     NuStep   Level 5   Minutes 34   METs 2.3      Nutrition:  Target Goals: Understanding of nutrition guidelines, daily intake of sodium <1537m, cholesterol <2084m calories 30% from fat and 7% or less from saturated fats, daily to have 5 or more servings of fruits and vegetables.  Biometrics:     Pre Biometrics - 11/15/16 1113      Pre Biometrics   Grip Strength 15 kg       Nutrition Therapy Plan and Nutrition Goals:   Nutrition Discharge: Rate Your Plate Scores:   Nutrition Goals Re-Evaluation:   Nutrition Goals Discharge (Final Nutrition Goals  Re-Evaluation):   Psychosocial: Target Goals: Acknowledge presence or absence of significant depression and/or stress, maximize coping skills, provide positive support system. Participant is able to verbalize types and ability to use techniques and skills needed for reducing stress and depression.  Initial Review & Psychosocial Screening:     Initial Psych Review & Screening - 11/15/16 1136      Initial Review   Current issues with None Identified     Family Dynamics   Good Support System? Yes     Barriers   Psychosocial barriers to participate in program There are no identifiable barriers or psychosocial needs.     Screening Interventions   Interventions Encouraged to exercise      Quality of Life Scores:   PHQ-9: Recent Review Flowsheet Data    [facility]   Decreased Interest 0   Down, Depressed, Hopeless 0   PHQ - 2 Score 0     Interpretation of Total Score  Total Score Depression Severity:  1-4 = Minimal depression, 5-9 = Mild depression, 10-14 = Moderate depression, 15-19 = Moderately severe depression, 20-27 = Severe depression   Psychosocial Evaluation and Intervention:     Psychosocial Evaluation - 11/15/16 1144      Psychosocial Evaluation & Interventions   Interventions Encouraged to exercise with the program and follow exercise prescription   Continue Psychosocial Services  No Follow up required      Psychosocial Re-Evaluation:     Psychosocial Re-Evaluation    [place] 12/31/16 0830             Psychosocial Re-Evaluation   Current issues with Current Stress Concerns       Comments patient extremely concerned over his most recent diagnosis of hypertension       Expected Outcomes through education and seeing improvement of his hypertension with medications patient will express resolved concern for hypertension       Interventions Encouraged to attend Pulmonary Rehabilitation for the exercise       Continue  Psychosocial Services  Follow up required by staff         Initial  Review   Source of Stress Concerns Chronic Illness;Unable to participate in former interests or hobbies;Unable to perform yard/household activities          Psychosocial Discharge (Final Psychosocial Re-Evaluation):     Psychosocial Re-Evaluation - 12/31/16 0830      Psychosocial Re-Evaluation   Current issues with Current Stress Concerns   Comments patient extremely concerned over his most recent diagnosis of hypertension   Expected Outcomes through education and seeing improvement of his hypertension with medications patient will express resolved concern for hypertension   Interventions Encouraged to attend Pulmonary Rehabilitation for the exercise   Continue Psychosocial Services  Follow up required by staff     Initial Review   Source of Stress Concerns Chronic Illness;Unable to participate in former interests or hobbies;Unable to perform yard/household activities      Education: Education Goals: Education classes will be provided on a weekly basis, covering required topics. Participant will state understanding/return demonstration of topics presented.  Learning Barriers/Preferences:     Learning Barriers/Preferences - 11/15/16 1134      Learning Barriers/Preferences   Learning Barriers None   Learning Preferences Computer/Internet;Group Instruction;Individual Instruction;Verbal Instruction;Written Material      Education Topics: Risk Factor Reduction:  -Group instruction that is supported by a PowerPoint presentation. Instructor discusses the definition of a risk factor, different risk factors for pulmonary disease, and how the heart and lungs work together.     Nutrition for Pulmonary Patient:  -Group instruction provided by PowerPoint slides, verbal discussion, and written materials to support subject matter. The instructor gives an explanation and review of healthy diet recommendations, which  includes a discussion on weight management, recommendations for fruit and vegetable consumption, as well as protein, fluid, caffeine, fiber, sodium, sugar, and alcohol. Tips for eating when patients are short of breath are discussed.   Pursed Lip Breathing:  -Group instruction that is supported by demonstration and informational handouts. Instructor discusses the benefits of pursed lip and diaphragmatic breathing and detailed demonstration on how to preform both.     Oxygen Safety:  -Group instruction provided by PowerPoint, verbal discussion, and written material to support subject matter. There is an overview of "What is Oxygen" and "Why do we need it".  Instructor also reviews how to create a safe environment for oxygen use, the importance of using oxygen as prescribed, and the risks of noncompliance. There is a brief discussion on traveling with oxygen and resources the patient may utilize.   Oxygen Equipment:  -Group instruction provided by [facility] Staff utilizing handouts, written materials, and equipment demonstrations.   Signs and Symptoms:  -Group instruction provided by written material and verbal discussion to support subject matter. Warning signs and symptoms of infection, stroke, and heart attack are reviewed and when to call the physician/911 reinforced. Tips for preventing the spread of infection discussed.   PULMONARY REHAB OTHER RESPIRATORY from 12/26/2016 in [place]  Date  12/26/16  Educator  rn  Instruction Review Code  2- meets goals/outcomes      Advanced Directives:  -Group instruction provided by verbal instruction and written material to support subject matter. Instructor reviews Advanced Directive laws and proper instruction for filling out document.   Pulmonary Video:  -Group video education that reviews the importance of medication and oxygen compliance, exercise, good nutrition, pulmonary hygiene, and pursed lip and  diaphragmatic breathing for the pulmonary patient.   PULMONARY REHAB OTHER RESPIRATORY from 12/26/2016 in [place]  Date  12/05/16  Instruction Review Code  2- meets goals/outcomes      Exercise for the Pulmonary Patient:  -Group instruction that is supported by a PowerPoint presentation. Instructor discusses benefits of exercise, core components of exercise, frequency, duration, and intensity of an exercise routine, importance of utilizing pulse oximetry during exercise, safety while exercising, and options of places to exercise outside of rehab.     PULMONARY REHAB OTHER RESPIRATORY from 12/26/2016 in [place]  Date  12/12/16  Educator  ep  Instruction Review Code  2- meets goals/outcomes      Pulmonary Medications:  -Verbally interactive group education provided by instructor with focus on inhaled medications and proper administration.   Anatomy and Physiology of the Respiratory System and Intimacy:  -Group instruction provided by PowerPoint, verbal discussion, and written material to support subject matter. Instructor reviews respiratory cycle and anatomical components of the respiratory system and their functions. Instructor also reviews differences in obstructive and restrictive respiratory diseases with examples of each. Intimacy, Sex, and Sexuality differences are reviewed with a discussion on how relationships can change when diagnosed with pulmonary disease. Common sexual concerns are reviewed.   Knowledge Questionnaire Score:     Knowledge Questionnaire Score - 12/05/16 1557      Knowledge Questionnaire Score   Pre Score 10/13      Core Components/Risk Factors/Patient Goals at Admission:     Personal Goals and Risk Factors at Admission - 12/31/16 0830      Core Components/Risk Factors/Patient Goals on Admission   Hypertension Yes   Intervention Provide education on lifestyle modifcations including  regular physical activity/exercise, weight management, moderate sodium restriction and increased consumption of fresh fruit, vegetables, and low fat dairy, alcohol moderation, and smoking cessation.;Monitor prescription use compliance.   Expected Outcomes Short Term: Continued assessment and intervention until BP is < 140/75m HG in hypertensive participants. < 130/830mHG in hypertensive participants with diabetes, heart failure or chronic kidney disease.;Long Term: Maintenance of blood pressure at goal levels.   Stress Yes   Intervention Offer individual and/or small group education and counseling on adjustment to heart disease, stress management and health-related lifestyle change. Teach and support self-help strategies.;Refer participants experiencing significant psychosocial distress to appropriate mental health specialists for further evaluation and treatment. When possible, include family members and significant others in education/counseling sessions.   Expected Outcomes Short Term: Participant demonstrates changes in health-related behavior, relaxation and other stress management skills, ability to obtain effective social support, and compliance with psychotropic medications if prescribed.;Long Term: Emotional wellbeing is indicated by absence of clinically significant psychosocial distress or social isolation.      Core Components/Risk Factors/Patient Goals Review:      Goals and Risk Factor Review    Row Name 12/31/16 0829             Core Components/Risk Factors/Patient Goals Review   Personal Goals Review Weight Management/Obesity;Improve shortness of breath with ADL's;Develop more efficient breathing techniques such as purse lipped breathing and diaphragmatic breathing and practicing self-pacing with activity.;Hypertension;Stress       Review see comment section on ITP       Expected Outcomes see admission expected outcomes          Core Components/Risk Factors/Patient Goals at  Discharge (Final Review):      Goals and Risk Factor Review - 12/31/16 0829      Core Components/Risk Factors/Patient Goals Review   Personal Goals Review Weight Management/Obesity;Improve shortness of breath with  ADL's;Develop more efficient breathing techniques such as purse lipped breathing and diaphragmatic breathing and practicing self-pacing with activity.;Hypertension;Stress   Review see comment section on ITP   Expected Outcomes see admission expected outcomes      ITP Comments:   Comments: ITP REVIEW Pt is making slow progress toward pulmonary rehab goals after completing 6 sessions. It was identified during his first few visits that patient had undiagnosed hypertension. This prevented workload increases and repeated conversations with his PCP for initiation and titration of BP meds. Patient most recently fell at home and injured his shoulder which has prevented him for exercising to his maximum ability. He has a history of depression but verbalizes resolution of those symptoms. His affect is somewhat flat but he engages in conversation during exercise. He states he is concerned with his BP and new diagnosis of ILD. He is also in the early acceptance phase of oxygen use. Patient will require close follow up for his worries and concerns.  Recommend continued exercise, life style modification, education, and utilization of breathing techniques to increase stamina and strength and decrease shortness of breath with exertion.

## 2016-12-31 NOTE — Progress Notes (Signed)
Daily Session Note  Patient Details  Name: Jeffrey Cobb MRN: [id number] Date of Birth: 1941-08-03 Referring Provider:     Pulmonary Rehab Walk Test from 11/19/2016 in [place]  Referring Provider  Dr. [name]      Encounter Date: 12/31/2016  Check In:     Session Check In - 12/31/16 1240      Check-In   Location [facility]   Staff Present Trish Fountain, RN, Maxcine Ham, RN, BSN;Molly diVincenzo, MS, ACSM RCEP, Exercise Physiologist;Mccall Lomax Ysidro Evert, RN   Supervising physician immediately available to respond to emergencies [facility] immediately available   Physician(s) Dr. Almetta Lovely   Medication changes reported     No   Fall or balance concerns reported    No   Tobacco Cessation No Change   Warm-up and Cool-down Performed as group-led instruction   Resistance Training Performed Yes   VAD Patient? No     Pain Assessment   Currently in Pain? No/denies   Multiple Pain Sites No      Capillary Blood Glucose: No results found for this or any previous visit (from the past 24 hour(s)).      Exercise Prescription Changes - 12/31/16 1200      Response to Exercise   Blood Pressure (Admit) 128/70   Blood Pressure (Exercise) 146/80   Blood Pressure (Exit) 100/60   Heart Rate (Admit) 83 bpm   Heart Rate (Exercise) 98 bpm   Heart Rate (Exit) 81 bpm   Oxygen Saturation (Admit) 95 %   Oxygen Saturation (Exercise) 89 %   Oxygen Saturation (Exit) 91 %   Rating of Perceived Exertion (Exercise) 11   Perceived Dyspnea (Exercise) 1   Duration Continue with 45 min of aerobic exercise without signs/symptoms of physical distress.   Intensity THRR unchanged     Progression   Progression Continue to progress workloads to maintain intensity without signs/symptoms of physical distress.     Resistance Training   Training Prescription Yes   Weight blue bands   Reps 10-15   Time 10 Minutes     Oxygen   Oxygen Continuous   Liters 4     Bike   Level 0.5   Minutes 17     NuStep   Level 5   Minutes 17   METs 2     Track   Laps 11   Minutes 17      History  Smoking Status  . Former Smoker  . Packs/day: 3.00  . Years: 17.00  . Types: Cigarettes  . Quit date: 08/26/1977  Smokeless Tobacco  . Never Used    Goals Met:  Exercise tolerated well No report of cardiac concerns or symptoms Strength training completed today  Goals Unmet:  Not Applicable  Comments: Service time is from 1030 to 1215    Dr. Rush Farmer is Medical Director for Pulmonary Rehab at [facility].

## 2017-01-02 ENCOUNTER — Encounter (HOSPITAL_COMMUNITY): Payer: Medicare Other

## 2017-01-07 ENCOUNTER — Encounter (HOSPITAL_COMMUNITY)
Admission: RE | Admit: 2017-01-07 | Discharge: 2017-01-07 | Disposition: A | Discharge status: Home / Self Care | Payer: Medicare Other | Source: Ambulatory Visit | Attending: Pulmonary Disease | Admitting: Pulmonary Disease

## 2017-01-07 VITALS — Wt 215.8 lb

## 2017-01-07 DIAGNOSIS — J849 Interstitial pulmonary disease, unspecified: Secondary | ICD-10-CM | POA: Diagnosis not present

## 2017-01-07 NOTE — Progress Notes (Addendum)
I have reviewed a Home Exercise Prescription with Jeffrey Cobb . Jeffrey Cobb is not currently exercising at home.  The patient was advised to walk 2-3 days a week for 30-45 minutes.  [place] and I discussed how to progress their exercise prescription.  The patient stated that their goals were to lose weight and increase walking endurance.  The patient stated that they understand the exercise prescription.  We reviewed exercise guidelines, target heart rate during exercise, oxygen use, weather, home pulse oximeter, endpoints for exercise, and goals.  Patient is encouraged to come to me with any questions. I will continue to follow up with the patient to assist them with progression and safety.

## 2017-01-07 NOTE — Progress Notes (Signed)
Daily Session Note  Patient Details  Name: Jeffrey Cobb MRN: [id number] Date of Birth: 11/30/1940 Referring Provider:     Pulmonary Rehab Walk Test from 11/19/2016 in [place]  Referring Provider  Dr. [name]      Encounter Date: 01/07/2017  Check In:     Session Check In - 01/07/17 1030      Check-In   [facility]   Staff Present Rosebud Poles, RN, BSN;Jove Beyl, MS, ACSM RCEP, Exercise Physiologist;Lisa Ysidro Evert, RN;Portia Rollene Rotunda, RN, BSN   Supervising physician immediately available to respond to emergencies [facility] immediately available   Physician(s) Dr. Wendee Beavers   Medication changes reported     No   Fall or balance concerns reported    No   Tobacco Cessation No Change   Warm-up and Cool-down Performed as group-led instruction   Resistance Training Performed Yes   VAD Patient? No     Pain Assessment   Currently in Pain? No/denies   Multiple Pain Sites No      Capillary Blood Glucose: No results found for this or any previous visit (from the past 24 hour(s)).      Exercise Prescription Changes - 01/07/17 1200      Response to Exercise   Blood Pressure (Admit) 118/72   Blood Pressure (Exercise) 162/80   Blood Pressure (Exit) 122/78   Heart Rate (Admit) 83 bpm   Heart Rate (Exercise) 96 bpm   Heart Rate (Exit) 87 bpm   Oxygen Saturation (Admit) 94 %   Oxygen Saturation (Exercise) 88 %   Oxygen Saturation (Exit) 94 %   Rating of Perceived Exertion (Exercise) 13   Perceived Dyspnea (Exercise) 2   Duration Continue with 45 min of aerobic exercise without signs/symptoms of physical distress.   Intensity THRR unchanged     Progression   Progression Continue to progress workloads to maintain intensity without signs/symptoms of physical distress.     Resistance Training   Training Prescription Yes   Weight blue bands   Reps 10-15   Time 10 Minutes     Oxygen   Oxygen Continuous   Liters 4     Bike   Level 0.8   Minutes 17     NuStep   Level 5   Minutes 17   METs 2.4     Track   Laps 10   Minutes 17      History  Smoking Status  . Former Smoker  . Packs/day: 3.00  . Years: 17.00  . Types: Cigarettes  . Quit date: 08/26/1977  Smokeless Tobacco  . Never Used    Goals Met:  Exercise tolerated well No report of cardiac concerns or symptoms Strength training completed today  Goals Unmet:  Not Applicable  Comments: Service time is from 10:30a to 12:15p    Dr. Rush Farmer is Medical Director for Pulmonary Rehab at [facility].

## 2017-01-09 ENCOUNTER — Encounter (HOSPITAL_COMMUNITY)
Admission: RE | Admit: 2017-01-09 | Discharge: 2017-01-09 | Disposition: A | Discharge status: Home / Self Care | Payer: Medicare Other | Source: Ambulatory Visit | Attending: Pulmonary Disease | Admitting: Pulmonary Disease

## 2017-01-09 VITALS — Wt 212.3 lb

## 2017-01-09 DIAGNOSIS — J849 Interstitial pulmonary disease, unspecified: Secondary | ICD-10-CM | POA: Diagnosis not present

## 2017-01-09 NOTE — Progress Notes (Signed)
While exercising at Pulmonary Rehab, [name] consistently uses 4-6 liters of continuous flow oxygen. This is provided by Pulmonary Rehab E-Cylindars. At home, the patient currently uses small e-cylinders for their home oxygen system. While exercising at home, the patient was instructed to use 4 liters pulsed. In Pulmonary Rehab today, Cari walked the track utilizing their own home oxygen system. They used 5 liters pulsed which maintained their oxygen saturation above 85%.

## 2017-01-09 NOTE — Progress Notes (Signed)
Daily Session Note  Patient Details  Name: Jeffrey Cobb MRN: [id number] Date of Birth: [birth year] Referring Provider:     Pulmonary Rehab Walk Test from 11/19/2016 in [place]  Referring Provider  Dr. [name]      Encounter Date: 01/09/2017  Check In:     Session Check In - 01/09/17 1056      Check-In   [facility]   Staff Present Su Hilt, MS, ACSM RCEP, Exercise Physiologist;Debara Kamphuis Leonia Reeves, RN, Roque Cash, RN   Supervising physician immediately available to respond to emergencies [facility] immediately available   Physician(s) Dr. Broadus John   Medication changes reported     No   Fall or balance concerns reported    No   Tobacco Cessation No Change   Warm-up and Cool-down Performed as group-led instruction   Resistance Training Performed Yes   VAD Patient? No     Pain Assessment   Currently in Pain? No/denies   Multiple Pain Sites No      Capillary Blood Glucose: No results found for this or any previous visit (from the past 24 hour(s)).      Exercise Prescription Changes - 01/09/17 1200      Response to Exercise   Blood Pressure (Admit) 126/64   Blood Pressure (Exercise) 140/76   Blood Pressure (Exit) 106/60   Heart Rate (Admit) 86 bpm   Heart Rate (Exercise) 99 bpm   Heart Rate (Exit) 84 bpm   Oxygen Saturation (Admit) 98 %   Oxygen Saturation (Exercise) 86 %   Oxygen Saturation (Exit) 93 %   Rating of Perceived Exertion (Exercise) 11   Perceived Dyspnea (Exercise) 1   Duration Continue with 45 min of aerobic exercise without signs/symptoms of physical distress.   Intensity THRR unchanged     Progression   Progression Continue to progress workloads to maintain intensity without signs/symptoms of physical distress.     Resistance Training   Training Prescription Yes   Weight blue bands   Reps 10-15   Time 10 Minutes     Interval Training   Interval Training No     Oxygen    Oxygen Continuous   Liters 4-5     Bike   Level 0.8   Minutes 17     Track   Laps 8   Minutes 17      History  Smoking Status  . Former Smoker  . Packs/day: 3.00  . Years: 17.00  . Types: Cigarettes  . Quit date: 08/26/1977  Smokeless Tobacco  . Never Used    Goals Met:  Exercise tolerated well Strength training completed today  Goals Unmet:  Not Applicable  Comments: Service time is from 1030 to 1220   Dr. Rush Farmer is Medical Director for Pulmonary Rehab at [facility]

## 2017-01-14 ENCOUNTER — Encounter (HOSPITAL_COMMUNITY): Payer: Medicare Other

## 2017-01-16 ENCOUNTER — Encounter (HOSPITAL_COMMUNITY)
Admission: RE | Admit: 2017-01-16 | Discharge: 2017-01-16 | Disposition: A | Discharge status: Home / Self Care | Payer: Medicare Other | Source: Ambulatory Visit | Attending: Pulmonary Disease | Admitting: Pulmonary Disease

## 2017-01-16 VITALS — Wt 213.2 lb

## 2017-01-16 DIAGNOSIS — J849 Interstitial pulmonary disease, unspecified: Secondary | ICD-10-CM

## 2017-01-16 NOTE — Progress Notes (Signed)
Daily Session Note  Patient Details  Name: Jeffrey Cobb MRN: [id number] Date of Birth: May 18, 1941 Referring Provider:     Pulmonary Rehab Walk Test from 11/19/2016 in [place]  Referring Provider  Dr. [name]      Encounter Date: 01/16/2017  Check In:     Session Check In - 01/16/17 1030      Check-In   [facility]   Staff Present Rosebud Poles, RN, Luisa Hart, RN, BSN;Ramon Dredge, RN, [facility]   Supervising physician immediately available to respond to emergencies [facility] immediately available   Physician(s) Dr. Karleen Hampshire   Medication changes reported     No   Fall or balance concerns reported    No   Tobacco Cessation No Change   Warm-up and Cool-down Performed as group-led instruction   Resistance Training Performed Yes   VAD Patient? No     Pain Assessment   Currently in Pain? No/denies   Multiple Pain Sites No      Capillary Blood Glucose: No results found for this or any previous visit (from the past 24 hour(s)).      Exercise Prescription Changes - 01/16/17 1200      Response to Exercise   Blood Pressure (Admit) 126/88   Blood Pressure (Exercise) 118/68   Blood Pressure (Exit) 98/64   Heart Rate (Admit) 91 bpm   Heart Rate (Exercise) 98 bpm   Heart Rate (Exit) 87 bpm   Oxygen Saturation (Admit) 97 %   Oxygen Saturation (Exercise) 94 %   Oxygen Saturation (Exit) 97 %   Rating of Perceived Exertion (Exercise) 11   Perceived Dyspnea (Exercise) 0   Duration Continue with 45 min of aerobic exercise without signs/symptoms of physical distress.   Intensity THRR unchanged     Progression   Progression Continue to progress workloads to maintain intensity without signs/symptoms of physical distress.     Resistance Training   Training Prescription Yes   Weight blue bands   Reps 10-15   Time 10 Minutes     Interval Training   Interval Training No     Oxygen   Oxygen Continuous    Liters 4-5     Bike   Level 0.6   Minutes 17     NuStep   Level 5   Minutes 17   METs 2      History  Smoking Status  . Former Smoker  . Packs/day: 3.00  . Years: 17.00  . Types: Cigarettes  . Quit date: 08/26/1977  Smokeless Tobacco  . Never Used    Goals Met:  Exercise tolerated well No report of cardiac concerns or symptoms Strength training completed today  Goals Unmet:  Not Applicable  Comments: Service time is from 1030 to 1215   Dr. Rush Farmer is Medical Director for Pulmonary Rehab at [facility].

## 2017-01-21 ENCOUNTER — Encounter (HOSPITAL_COMMUNITY)
Admission: RE | Admit: 2017-01-21 | Discharge: 2017-01-21 | Disposition: A | Discharge status: Home / Self Care | Payer: Medicare Other | Source: Ambulatory Visit | Attending: Pulmonary Disease | Admitting: Pulmonary Disease

## 2017-01-21 VITALS — Wt 210.5 lb

## 2017-01-21 DIAGNOSIS — J849 Interstitial pulmonary disease, unspecified: Secondary | ICD-10-CM | POA: Diagnosis not present

## 2017-01-21 NOTE — Progress Notes (Signed)
Daily Session Note  Patient Details  Name: Jeffrey Cobb MRN: [id number] Date of Birth: 1941-07-12 Referring Provider:     Pulmonary Rehab Walk Test from 11/19/2016 in [place]  Referring Provider  Dr. [name]      Encounter Date: 01/21/2017  Check In:     Session Check In - 01/21/17 1021      Check-In   Location [facility]   Staff Present Su Hilt, MS, ACSM RCEP, Exercise Physiologist;Joan Leonia Reeves, RN, Luisa Hart, RN, BSN   Supervising physician immediately available to respond to emergencies [facility] immediately available   Physician(s) Dr. Karleen Hampshire   Medication changes reported     No   Fall or balance concerns reported    No   Tobacco Cessation No Change   Warm-up and Cool-down Performed as group-led instruction   Resistance Training Performed Yes   VAD Patient? No     Pain Assessment   Currently in Pain? No/denies   Multiple Pain Sites No      Capillary Blood Glucose: No results found for this or any previous visit (from the past 24 hour(s)).      Exercise Prescription Changes - 01/21/17 1218      Response to Exercise   Blood Pressure (Admit) 124/64   Blood Pressure (Exercise) 132/74   Blood Pressure (Exit) 108/60   Heart Rate (Admit) 83 bpm   Heart Rate (Exercise) 100 bpm   Heart Rate (Exit) 93 bpm   Oxygen Saturation (Admit) 97 %   Oxygen Saturation (Exercise) 88 %   Oxygen Saturation (Exit) 90 %   Rating of Perceived Exertion (Exercise) 13   Perceived Dyspnea (Exercise) 0   Duration Continue with 45 min of aerobic exercise without signs/symptoms of physical distress.   Intensity THRR unchanged     Progression   Progression Continue to progress workloads to maintain intensity without signs/symptoms of physical distress.     Resistance Training   Training Prescription Yes   Weight blue bands   Reps 10-15   Time 10 Minutes     Interval Training   Interval Training No     Oxygen   Oxygen Continuous   Liters 4-5     Bike   Level 0.8   Minutes 17     NuStep   Level 5   Minutes 17   METs 1.9     Track   Laps 13   Minutes 17      History  Smoking Status  . Former Smoker  . Packs/day: 3.00  . Years: 17.00  . Types: Cigarettes  . Quit date: 08/26/1977  Smokeless Tobacco  . Never Used    Goals Met:  Independence with exercise equipment Improved SOB with ADL's Using PLB without cueing & demonstrates good technique Exercise tolerated well No report of cardiac concerns or symptoms Strength training completed today  Goals Unmet:  Not Applicable  Comments: Service time is from 1030 to 1205   Dr. Rush Farmer is Medical Director for Pulmonary Rehab at [facility].

## 2017-01-22 ENCOUNTER — Ambulatory Visit: Payer: Medicare Other | Admitting: Pulmonary Disease

## 2017-01-23 ENCOUNTER — Ambulatory Visit (INDEPENDENT_AMBULATORY_CARE_PROVIDER_SITE_OTHER): Payer: Medicare Other | Admitting: Adult Health

## 2017-01-23 ENCOUNTER — Encounter: Payer: Self-pay | Admitting: Adult Health

## 2017-01-23 ENCOUNTER — Encounter (HOSPITAL_COMMUNITY)
Admission: RE | Admit: 2017-01-23 | Discharge: 2017-01-23 | Disposition: A | Discharge status: Home / Self Care | Payer: Medicare Other | Source: Ambulatory Visit | Attending: Pulmonary Disease | Admitting: Pulmonary Disease

## 2017-01-23 ENCOUNTER — Other Ambulatory Visit (INDEPENDENT_AMBULATORY_CARE_PROVIDER_SITE_OTHER): Payer: Medicare Other

## 2017-01-23 VITALS — Wt 213.2 lb

## 2017-01-23 VITALS — BP 128/76 | HR 76 | Ht 67.0 in | Wt 212.2 lb

## 2017-01-23 DIAGNOSIS — J9611 Chronic respiratory failure with hypoxia: Secondary | ICD-10-CM | POA: Diagnosis not present

## 2017-01-23 DIAGNOSIS — J84112 Idiopathic pulmonary fibrosis: Secondary | ICD-10-CM

## 2017-01-23 DIAGNOSIS — J849 Interstitial pulmonary disease, unspecified: Secondary | ICD-10-CM | POA: Diagnosis not present

## 2017-01-23 DIAGNOSIS — G4733 Obstructive sleep apnea (adult) (pediatric): Secondary | ICD-10-CM | POA: Diagnosis not present

## 2017-01-23 DIAGNOSIS — Z9989 Dependence on other enabling machines and devices: Secondary | ICD-10-CM | POA: Diagnosis not present

## 2017-01-23 LAB — HEPATIC FUNCTION PANEL
ALT: 18 U/L (ref 0–53)
AST: 23 U/L (ref 0–37)
Albumin: 3.9 g/dL (ref 3.5–5.2)
Alkaline Phosphatase: 67 U/L (ref 39–117)
BILIRUBIN TOTAL: 0.5 mg/dL (ref 0.2–1.2)
Bilirubin, Direct: 0.1 mg/dL (ref 0.0–0.3)
TOTAL PROTEIN: 7.3 g/dL (ref 6.0–8.3)

## 2017-01-23 NOTE — Assessment & Plan Note (Signed)
Stable on current regimen  Check labs on OFEV  Cont w/ pulm rehab and O2 .

## 2017-01-23 NOTE — Progress Notes (Signed)
Daily Session Note  Patient Details  Name: MOMODOU CONSIGLIO MRN: [id number] Date of Birth: 1941/02/01 Referring Provider:     Pulmonary Rehab Walk Test from 11/19/2016 in [place]  Referring Provider  Dr. [name]      Encounter Date: 01/23/2017  Check In:     Session Check In - 01/23/17 1016      Check-In   Location [facility]   Staff Present Su Hilt, MS, ACSM RCEP, Exercise Physiologist;Portia Rollene Rotunda, RN, BSN   Supervising physician immediately available to respond to emergencies [facility] immediately available   Physician(s) Dr. Broadus John   Medication changes reported     No   Fall or balance concerns reported    No   Tobacco Cessation No Change   Warm-up and Cool-down Performed as group-led instruction   Resistance Training Performed Yes   VAD Patient? No     Pain Assessment   Currently in Pain? No/denies   Multiple Pain Sites No      Capillary Blood Glucose: Results for orders placed or performed in visit on 01/23/17 (from the past 24 hour(s))  Hepatic function panel     Status: None   Collection Time: 01/23/17  9:40 AM  Result Value Ref Range   Total Bilirubin 0.5 0.2 - 1.2 mg/dL   Bilirubin, Direct 0.1 0.0 - 0.3 mg/dL   Alkaline Phosphatase 67 39 - 117 U/L   AST 23 0 - 37 U/L   ALT 18 0 - 53 U/L   Total Protein 7.3 6.0 - 8.3 g/dL   Albumin 3.9 3.5 - 5.2 g/dL        Exercise Prescription Changes - 01/23/17 1200      Response to Exercise   Blood Pressure (Admit) 146/80   Blood Pressure (Exercise) 120/86   Blood Pressure (Exit) 116/64   Heart Rate (Admit) 82 bpm   Heart Rate (Exercise) 109 bpm   Heart Rate (Exit) 88 bpm   Oxygen Saturation (Admit) 98 %   Oxygen Saturation (Exercise) 86 %   Oxygen Saturation (Exit) 88 %   Rating of Perceived Exertion (Exercise) 11   Perceived Dyspnea (Exercise) 1   Duration Continue with 45 min of aerobic exercise without signs/symptoms of physical  distress.   Intensity THRR unchanged     Progression   Progression Continue to progress workloads to maintain intensity without signs/symptoms of physical distress.     Resistance Training   Training Prescription Yes   Weight blue bands   Reps 10-15   Time 10 Minutes     Interval Training   Interval Training No     Oxygen   Oxygen Continuous   Liters 4-5     Bike   Level 0.8   Minutes 17     Track   Laps 16   Minutes 17      History  Smoking Status  . Former Smoker  . Packs/day: 3.00  . Years: 17.00  . Types: Cigarettes  . Quit date: 08/26/1977  Smokeless Tobacco  . Never Used    Goals Met:  Exercise tolerated well No report of cardiac concerns or symptoms Strength training completed today  Goals Unmet:  Not Applicable  Comments: Service time is from 10:30a to 12:20p    Dr. Rush Farmer is Medical Director for Pulmonary Rehab at [facility].

## 2017-01-23 NOTE — Addendum Note (Signed)
Addended by: Boone MasterJONES, [name] E on: 01/23/2017 01:13 PM   Modules accepted: Orders

## 2017-01-23 NOTE — Patient Instructions (Addendum)
Set up CPAP on auto titration. [facility] to [facility]  Wear CPAP At bedtime  With oxygen  Oxygen test At bedtime  In 2 weeks.  CPAP download in 6-8 weeks . Bring your SD card.  Continue OFEV .  Labs today .  Continue on Oxygen.  Continue on Pulmonary Rehab.  Follow up with Dr. Kendrick FriesMcQuaid in 6-8 weeks and As needed   Please contact office for sooner follow up if symptoms do not improve or worsen or seek emergency care

## 2017-01-23 NOTE — Assessment & Plan Note (Signed)
Not tolerating CPAP  Change to auto titration with 5-15 cm H20 w/ O2  Check ONO in 2 weeks.  CPAP download on return in  6 weeks

## 2017-01-23 NOTE — Progress Notes (Signed)
@Patient  ID: Jeffrey Cobb, male    DOB: 1940/10/19, 76 y.o.   MRN: [id number]  Chief Complaint  Patient presents with  . Follow-up    ILD /OSA     Referring provider: Daisy Floro, MD  HPI: 76 year old male former smoker followed for pulmonary fibrosis and chronic respiratory failure on oxygen . OSA   TEST  05/2015 CT chest worrisome for UIP 01/2016 HRCT > traction bronchiolectasis and interlobular septal thickening in a basilar predominance have progressed compared to July 2016, there remains no frank honeycombing. There are multiple pulmonary nodules which are stable or have resolved. June 2017 pulmonary function testing FEV1 2.99 L (111% predicted, FVC 3.67 L (98% predicted), total lung capacity 5.53 L (85% predicted), DLCO 12.76 (45% predicted). August 2016 6 minute walk test 336 m O2 sats ration 92% on room air 09/2016 HRCT> worrisome for UIP 10/2016 Serology> ANA weakly positive, RF neg, CCP neg, hypersensitivity negative, aldolase negative, scl 70 negative, SSA/SSB negative 10/2016 6 MW 1230 feet, 88% on 6lpm nadir  01/23/2017 Follow up : ILD /OSA /O2 RF  Pt returns for one-month follow-up for ILD . Remains on OFEV  Had 1 episode of upset stomach , was an isolated episode . Has not occurred again.  Breathing is stable. No flare of dyspnea or cough . Goes to pulmonary rehab  Says he does real well with rehab.   Uses Oxygen 4l/ daytiime . Use Oxygen 5lm with exercise at rehab. Waiting to get POC.   Pt has OSA . Is suppose to be on CPAP . He tries to wear this each night but has trouble wearing it . It was changed to a auto titrating CPAP but settings have not been changed.   Allergies  Allergen Reactions  . Gabapentin     Dizziness   . Sulfonamide Derivatives Rash  . Trazodone And Nefazodone Rash and Other (See Comments)    Hallucinations     Immunization History  Administered Date(s) Administered  . Influenza Split 04/27/2011, 05/01/2012, 05/03/2015  .  Influenza Whole 06/26/2009, 04/26/2010, 05/26/2010  . Influenza, High Dose Seasonal PF 05/21/2016  . Influenza,inj,Quad PF,36+ Mos 05/17/2013  . Influenza-Unspecified 04/26/2014  . Pneumococcal Polysaccharide-23 05/26/2008    Past Medical History:  Diagnosis Date  . Acute sinusitis, unspecified   . Arthritis   . 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 GERD (gastroesophageal reflux disease)   . Left anterior fascicular block    hx of on ekg  . Mixed hyperlipidemia   . Obstructive chronic bronchitis with exacerbation (HCC)   . Occlusion and stenosis of carotid artery without mention of cerebral infarction    a. 05/2013 Carotid U/S: 1-39% bilat stenosis.  . Other emphysema (HCC)   . 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 Sleep apnea    does not use cpap due to cough  . Stroke (HCC) 05/2015    Tobacco History: History  Smoking Status  . Former Smoker  . Packs/day: 3.00  . Years: 17.00  . Types: Cigarettes  . Quit date: 08/26/1977  Smokeless Tobacco  . Never Used   Counseling given: Not Answered   Outpatient Encounter Prescriptions as of 01/23/2017  Medication Sig  . albuterol (PROVENTIL) (2.5 MG/3ML) 0.083% nebulizer solution Take 3 mLs (2.5 mg total) by nebulization every 6 (six) hours as needed for wheezing or shortness of breath.  Marland Kitchen aspirin 81 MG tablet Take 81 mg  by mouth daily.  Marland Kitchen. atorvastatin (LIPITOR) 40 MG tablet Take 1 tablet (40 mg total) by mouth daily at 6 PM.  . cyanocobalamin 500 MCG tablet Take 1,000 mcg by mouth daily.  . meloxicam (MOBIC) 15 MG tablet Take 1 tablet by mouth daily.  . Nintedanib (OFEV) 150 MG CAPS Take 150 mg by mouth 2 (two) times daily.  Marland Kitchen. oxymetazoline (AFRIN) 0.05 % nasal spray Place 1 spray into both nostrils as needed for congestion.  . pramipexole (MIRAPEX) 0.5 MG tablet TAKE 1 TABLET BY MOUTH EVERY MORNING AND TAKE 1 TABLET EVERY EVENING  . PROAIR HFA 108 (90  BASE) MCG/ACT inhaler Inhale 2 puffs into the lungs every 6 (six) hours as needed.  Marland Kitchen. QUEtiapine Fumarate (SEROQUEL PO) Take 50 mg by mouth at bedtime.  . triamcinolone cream (KENALOG) 0.1 % APPLY 1 APPLICATION ON THE SKIN TWICE A DAY AS NEEDED  . [DISCONTINUED] chlorpheniramine-HYDROcodone (TUSSIONEX PENNKINETIC ER) 10-8 MG/5ML SUER Take 5 mLs by mouth every 12 (twelve) hours as needed for cough. (Patient not taking: Reported on 01/23/2017)   No facility-administered encounter medications on file as of 01/23/2017.      Review of Systems  Constitutional:   No  weight loss, night sweats,  Fevers, chills, fatigue, or  lassitude.  HEENT:   No headaches,  Difficulty swallowing,  Tooth/dental problems, or  Sore throat,                No sneezing, itching, ear ache, nasal congestion, post nasal drip,   CV:  No chest pain,  Orthopnea, PND, swelling in lower extremities, anasarca, dizziness, palpitations, syncope.   GI  No heartburn, indigestion, abdominal pain, nausea, vomiting, diarrhea, change in bowel habits, loss of appetite, bloody stools.   Resp:   No excess mucus, no productive cough,  No non-productive cough,  No coughing up of blood.  No change in color of mucus.  No wheezing.  No chest wall deformity  Skin: no rash or lesions.  GU: no dysuria, change in color of urine, no urgency or frequency.  No flank pain, no hematuria   MS:  No joint pain or swelling.  No decreased range of motion.  No back pain.    Physical Exam  BP 128/76 (BP Location: Left Arm, Cuff Size: Normal)   Pulse 76   Ht 5\' 7"  (1.702 m)   Wt 212 lb 3.2 oz (96.3 kg)   SpO2 97%   BMI 33.24 kg/m   GEN: A/Ox3; pleasant , NAD, elderly on o2    HEENT:  [place]/AT,  EACs-clear, TMs-wnl, NOSE-clear, THROAT-clear, no lesions, no postnasal drip or exudate noted.   NECK:  Supple w/ fair ROM; no JVD; normal carotid impulses w/o bruits; no thyromegaly or nodules palpated; no lymphadenopathy.    RESP BB Crackles o accessory  muscle use, no dullness to percussion  CARD:  RRR, no m/r/g, no peripheral edema, pulses intact, no cyanosis or clubbing.  GI:   Soft & nt; nml bowel sounds; no organomegaly or masses detected.   Musco: Warm bil, no deformities or joint swelling noted.   Neuro: alert, no focal deficits noted.    Skin: Warm, no lesions or rashes    Lab Results:   BMET  No results found.   Assessment & Plan:   No problem-specific Assessment & Plan notes found for this encounter.     Rubye Oaksammy Rotha Cassels, NP 01/23/2017

## 2017-01-23 NOTE — Assessment & Plan Note (Signed)
Cont On O2  Add O2 to CPAP  Check ONO in 2 weeks. On CPAP w/ O2

## 2017-01-23 NOTE — Progress Notes (Signed)
Reviweed, agree

## 2017-01-28 ENCOUNTER — Encounter (HOSPITAL_COMMUNITY)
Admission: RE | Admit: 2017-01-28 | Discharge: 2017-01-28 | Disposition: A | Discharge status: Home / Self Care | Payer: Medicare Other | Source: Ambulatory Visit | Attending: Pulmonary Disease | Admitting: Pulmonary Disease

## 2017-01-28 ENCOUNTER — Other Ambulatory Visit: Payer: Self-pay

## 2017-01-28 VITALS — Wt 214.1 lb

## 2017-01-28 DIAGNOSIS — J84112 Idiopathic pulmonary fibrosis: Secondary | ICD-10-CM

## 2017-01-28 DIAGNOSIS — J849 Interstitial pulmonary disease, unspecified: Secondary | ICD-10-CM | POA: Insufficient documentation

## 2017-01-28 DIAGNOSIS — J9611 Chronic respiratory failure with hypoxia: Secondary | ICD-10-CM

## 2017-01-28 NOTE — Progress Notes (Signed)
Daily Session Note  Patient Details  Name: Jeffrey Cobb MRN: [id number] Date of Birth: 1941-06-17 Referring Provider:     Pulmonary Rehab Walk Test from 11/19/2016 in [place]  Referring Provider  Dr. [name]      Encounter Date: 01/28/2017  Check In:     Session Check In - 01/28/17 1031      Check-In   Location [facility]   Staff Present Rosebud Poles, RN, BSN;Randi Poullard, MS, ACSM RCEP, Exercise Physiologist;Lisa Ysidro Evert, RN;Portia Rollene Rotunda, RN, BSN   Supervising physician immediately available to respond to emergencies [facility] immediately available   Physician(s) Dr.Joseph   Medication changes reported     No   Fall or balance concerns reported    No   Tobacco Cessation No Change   Warm-up and Cool-down Performed as group-led instruction   Resistance Training Performed Yes   VAD Patient? No     Pain Assessment   Currently in Pain? No/denies   Multiple Pain Sites No      Capillary Blood Glucose: No results found for this or any previous visit (from the past 24 hour(s)).      Exercise Prescription Changes - 01/28/17 1200      Response to Exercise   Blood Pressure (Admit) 120/64   Blood Pressure (Exercise) 122/70   Blood Pressure (Exit) 124/74   Heart Rate (Admit) 92 bpm   Heart Rate (Exercise) 106 bpm   Heart Rate (Exit) 92 bpm   Oxygen Saturation (Admit) 96 %   Oxygen Saturation (Exercise) 84 %   Oxygen Saturation (Exit) 89 %   Rating of Perceived Exertion (Exercise) 13   Perceived Dyspnea (Exercise) 1   Duration Continue with 45 min of aerobic exercise without signs/symptoms of physical distress.   Intensity THRR unchanged     Progression   Progression Continue to progress workloads to maintain intensity without signs/symptoms of physical distress.     Resistance Training   Training Prescription Yes   Weight blue bands   Reps 10-15   Time 10 Minutes     Interval Training   Interval  Training No     Oxygen   Oxygen Continuous   Liters 4-5     Bike   Level 0.8   Minutes 17     NuStep   Level 5   Minutes 17   METs 2.3     Track   Laps 10   Minutes 17      History  Smoking Status  . Former Smoker  . Packs/day: 3.00  . Years: 17.00  . Types: Cigarettes  . Quit date: 08/26/1977  Smokeless Tobacco  . Never Used    Goals Met:  Exercise tolerated well No report of cardiac concerns or symptoms Strength training completed today  Goals Unmet:  Not Applicable  Comments: Service time is from 10:30a to 12:10p    Dr. Rush Farmer is Medical Director for Pulmonary Rehab at [facility].

## 2017-01-30 ENCOUNTER — Telehealth: Payer: Self-pay | Admitting: Pulmonary Disease

## 2017-01-30 ENCOUNTER — Encounter (HOSPITAL_COMMUNITY)
Admission: RE | Admit: 2017-01-30 | Discharge: 2017-01-30 | Disposition: A | Discharge status: Home / Self Care | Payer: Medicare Other | Source: Ambulatory Visit | Attending: Pulmonary Disease | Admitting: Pulmonary Disease

## 2017-01-30 VITALS — Wt 212.5 lb

## 2017-01-30 DIAGNOSIS — J849 Interstitial pulmonary disease, unspecified: Secondary | ICD-10-CM

## 2017-01-30 NOTE — Progress Notes (Signed)
Daily Session Note  Patient Details  Name: Jeffrey Cobb MRN: [id number] Date of Birth: 02-23-1941 Referring Provider:     Pulmonary Rehab Walk Test from 11/19/2016 in [place]  Referring Provider  Dr. [name]      Encounter Date: 01/30/2017  Check In:     Session Check In - 01/30/17 1024      Check-In   Location [facility]   Staff Present Trish Fountain, RN, Maxcine Ham, RN, BSN;Tinika Bucknam, MS, ACSM RCEP, Exercise Physiologist;Lisa Ysidro Evert, RN   Supervising physician immediately available to respond to emergencies [facility] immediately available   Physician(s) Dr. Posey Pronto   Medication changes reported     No   Fall or balance concerns reported    No   Tobacco Cessation No Change   Warm-up and Cool-down Performed as group-led instruction   Resistance Training Performed Yes   VAD Patient? No     Pain Assessment   Currently in Pain? No/denies   Multiple Pain Sites No      Capillary Blood Glucose: No results found for this or any previous visit (from the past 24 hour(s)).      Exercise Prescription Changes - 01/30/17 1200      Response to Exercise   Blood Pressure (Admit) 136/70   Blood Pressure (Exercise) 140/80   Blood Pressure (Exit) 104/60   Heart Rate (Admit) 88 bpm   Heart Rate (Exercise) 110 bpm   Heart Rate (Exit) 96 bpm   Oxygen Saturation (Admit) 98 %   Oxygen Saturation (Exercise) 89 %   Oxygen Saturation (Exit) 98 %   Rating of Perceived Exertion (Exercise) 13   Perceived Dyspnea (Exercise) 1   Duration Continue with 45 min of aerobic exercise without signs/symptoms of physical distress.   Intensity THRR unchanged     Progression   Progression Continue to progress workloads to maintain intensity without signs/symptoms of physical distress.     Resistance Training   Training Prescription Yes   Weight blue bands   Reps 10-15   Time 10 Minutes     Interval Training   Interval  Training No     Oxygen   Oxygen Continuous   Liters 4-5     Bike   Level 0.8   Minutes 17     NuStep   Level 5   Minutes 17   METs 2.1     Track   Laps 11   Minutes 17      History  Smoking Status  . Former Smoker  . Packs/day: 3.00  . Years: 17.00  . Types: Cigarettes  . Quit date: 08/26/1977  Smokeless Tobacco  . Never Used    Goals Met:  Exercise tolerated well No report of cardiac concerns or symptoms Strength training completed today  Goals Unmet:  Not Applicable  Comments: Service time is from 10:30a to 12:10p    Dr. Rush Farmer is Medical Director for Pulmonary Rehab at [facility].

## 2017-01-30 NOTE — Telephone Encounter (Signed)
Spoke with pt. He was wanting to know if he could take Meloxicam for his shoulder pain. Advised him that he should ask his PCP about this matter. He verbalized understanding. Nothing further was needed.

## 2017-01-30 NOTE — Progress Notes (Signed)
Pulmonary Individual Treatment Plan  Patient Details  Name: Jeffrey Cobb MRN: [id number] Date of Birth: 09/23/1940 Referring Provider:     Pulmonary Rehab Walk Test from 11/19/2016 in [place]  Referring Provider  Dr. [name]      Initial Encounter Date:    Pulmonary Rehab Walk Test from 11/19/2016 in [place]  Date  11/19/16  Referring Provider  Dr. [name]      Visit Diagnosis: ILD (interstitial lung disease) ([place])  Patient's Home Medications on Admission:   Current Outpatient Prescriptions:  .  albuterol (PROVENTIL) (2.5 MG/3ML) 0.083% nebulizer solution, Take 3 mLs (2.5 mg total) by nebulization every 6 (six) hours as needed for wheezing or shortness of breath., Disp: 360 mL, Rfl: 11 .  aspirin 81 MG tablet, Take 81 mg by mouth daily., Disp: , Rfl:  .  atorvastatin (LIPITOR) 40 MG tablet, Take 1 tablet (40 mg total) by mouth daily at 6 PM., Disp: 30 tablet, Rfl: 0 .  cyanocobalamin 500 MCG tablet, Take 1,000 mcg by mouth daily., Disp: , Rfl:  .  meloxicam (MOBIC) 15 MG tablet, Take 1 tablet by mouth daily., Disp: , Rfl:  .  Nintedanib (OFEV) 150 MG CAPS, Take 150 mg by mouth 2 (two) times daily., Disp: 60 capsule, Rfl:  .  oxymetazoline (AFRIN) 0.05 % nasal spray, Place 1 spray into both nostrils as needed for congestion., Disp: , Rfl:  .  pramipexole (MIRAPEX) 0.5 MG tablet, TAKE 1 TABLET BY MOUTH EVERY MORNING AND TAKE 1 TABLET EVERY EVENING, Disp: 180 tablet, Rfl: 3 .  PROAIR HFA 108 (90 BASE) MCG/ACT inhaler, Inhale 2 puffs into the lungs every 6 (six) hours as needed., Disp: , Rfl:  .  QUEtiapine Fumarate (SEROQUEL PO), Take 50 mg by mouth at bedtime., Disp: , Rfl:  .  triamcinolone cream (KENALOG) 0.1 %, APPLY 1 APPLICATION ON THE SKIN TWICE A DAY AS NEEDED, Disp: , Rfl: 2  Past Medical History: Past Medical History:  Diagnosis Date  . Acute sinusitis, unspecified   . Arthritis   . Complication of  anesthesia    decveloped sleep problems after knee surgery2001  . GERD (gastroesophageal reflux disease)   . Left anterior fascicular block    hx of on ekg  . Mixed hyperlipidemia   . Obstructive chronic bronchitis with exacerbation ([place])   . Occlusion and stenosis of carotid artery without mention of cerebral infarction    a. 05/2013 Carotid U/S: 1-39% bilat stenosis.  . Other emphysema ([place])   . 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 Sleep apnea    does not use cpap due to cough  . Stroke ([place]) 05/2015    Tobacco Use: History  Smoking Status  . Former Smoker  . Packs/day: 3.00  . Years: 17.00  . Types: Cigarettes  . Quit date: 08/26/1977  Smokeless Tobacco  . Never Used    Labs: Recent Review Flowsheet Data    Labs for ITP Cardiac and Pulmonary Rehab Latest Ref Rng & Units 10/27/2007 11/01/2008 01/17/2011 06/01/2015   Cholestrol 0 - 200 mg/dL 175 169 165 170   LDLCALC 0 - 99 mg/dL 108(H) 105(H) 90 103(H)   HDL >40 mg/dL 57.8 52.5 67.40 58   Trlycerides <150 mg/dL 44 59 40.0 46   Hemoglobin A1c 4.8 - 5.6 % - - - 5.7(H)      Capillary Blood Glucose: No results found for: GLUCAP  ADL [facility]:     Pulmonary Assessment Scores    Row Name 12/05/16 1558 01/07/17 1525       ADL [facility]   ADL Phase Entry  -    SOB Score total 100  -      CAT Score   CAT Score  - 26  Pre       Pulmonary Function Assessment:     Pulmonary Function Assessment - 11/15/16 1135      Breath   Bilateral Breath Sounds --  fine crackles R middle with deep inspiration   Shortness of Breath Yes;Limiting activity      Exercise Target Goals:    Exercise Program Goal: Individual exercise prescription set with THRR, safety & activity barriers. Participant demonstrates ability to understand and report RPE using BORG scale, to self-measure pulse accurately, and to acknowledge the importance of the exercise prescription.  Exercise Prescription Goal: Starting  with aerobic activity 30 plus minutes a day, 3 days per week for initial exercise prescription. Provide home exercise prescription and guidelines that participant acknowledges understanding prior to discharge.  Activity Barriers & Risk Stratification:   6 Minute Walk:     6 Minute Walk    Row Name 11/19/16 1612         6 Minute Walk   Phase Initial     Distance 1230 feet     Walk Time 6 minutes     # of Rest Breaks 0     MPH 2.32     METS 2.76     RPE 12     Perceived Dyspnea  0     Symptoms No     Resting HR 88 bpm     Resting BP 138/90     Max Ex. HR 110 bpm     Max Ex. BP 150/70       Interval HR   Baseline HR 88     1 Minute HR 94     2 Minute HR 102     3 Minute HR 96     4 Minute HR 101     5 Minute HR 100     6 Minute HR 110     2 Minute Post HR 100     Interval Heart Rate? Yes       Interval Oxygen   Interval Oxygen? Yes     Baseline Oxygen Saturation % 94 %     Baseline Liters of Oxygen 4 L     1 Minute Oxygen Saturation % 94 %     1 Minute Liters of Oxygen 4 L     2 Minute Oxygen Saturation % 89 %     2 Minute Liters of Oxygen 4 L     3 Minute Oxygen Saturation % 86 %     3 Minute Liters of Oxygen 4 L     4 Minute Oxygen Saturation % 89 %     4 Minute Liters of Oxygen 6 L     5 Minute Oxygen Saturation % 89 %     5 Minute Liters of Oxygen 6 L     6 Minute Oxygen Saturation % 88 %     6 Minute Liters of Oxygen 6 L     2 Minute Post Oxygen Saturation % 93 %     2 Minute Post Liters of Oxygen 6 L        Oxygen Initial Assessment:     Oxygen Initial Assessment -  11/15/16 1100      Home Oxygen   Home Oxygen Device Home Concentrator;E-Tanks   Sleep Oxygen Prescription None   Home Exercise Oxygen Prescription Continuous   Liters per minute 4   Home at Rest Exercise Oxygen Prescription None   Compliance with Home Oxygen Use No   Comments --  does not wear with exertion as he should     Intervention   Short Term Goals To learn and exhibit  compliance with exercise, home and travel O2 prescription;To learn and understand importance of monitoring SPO2 with pulse oximeter and demonstrate accurate use of the pulse oximeter.;To Learn and understand importance of maintaining oxygen saturations>88%;To learn and demonstrate proper purse lipped breathing techniques or other breathing techniques.;To learn and demonstrate proper use of respiratory medications   Long  Term Goals Exhibits compliance with exercise, home and travel O2 prescription;Maintenance of O2 saturations>88%;Compliance with respiratory medication      Oxygen Re-Evaluation:     Oxygen Re-Evaluation    Row Name 12/31/16 0827 01/30/17 1301           Program Oxygen Prescription   Program Oxygen Prescription Continuous Continuous      Liters per minute 6 6        Home Oxygen   Home Oxygen Device Home Concentrator;E-Tanks Home Concentrator;E-Tanks      Sleep Oxygen Prescription None None      Home Exercise Oxygen Prescription Continuous Continuous      Liters per minute 4 4      Home at Rest Exercise Oxygen Prescription None None      Compliance with Home Oxygen Use Yes Yes      Comments -  patient now compliant with home oxygen prescription  -        Goals/Expected Outcomes   Short Term Goals To learn and exhibit compliance with exercise, home and travel O2 prescription;To learn and understand importance of monitoring SPO2 with pulse oximeter and demonstrate accurate use of the pulse oximeter.;To Learn and understand importance of maintaining oxygen saturations>88%;To learn and demonstrate proper purse lipped breathing techniques or other breathing techniques.;To learn and demonstrate proper use of respiratory medications To learn and exhibit compliance with exercise, home and travel O2 prescription;To learn and understand importance of monitoring SPO2 with pulse oximeter and demonstrate accurate use of the pulse oximeter.;To Learn and understand importance of  maintaining oxygen saturations>88%;To learn and demonstrate proper purse lipped breathing techniques or other breathing techniques.;To learn and demonstrate proper use of respiratory medications      Long  Term Goals Exhibits compliance with exercise, home and travel O2 prescription;Maintenance of O2 saturations>88%;Compliance with respiratory medication Exhibits compliance with exercise, home and travel O2 prescription;Maintenance of O2 saturations>88%;Compliance with respiratory medication      Comments  - patient verbalizing compliance with home O2. Patient observed wearing his home o2 into rehab.         Oxygen Discharge (Final Oxygen Re-Evaluation):     Oxygen Re-Evaluation - 01/30/17 1301      Program Oxygen Prescription   Program Oxygen Prescription Continuous   Liters per minute 6     Home Oxygen   Home Oxygen Device Home Concentrator;E-Tanks   Sleep Oxygen Prescription None   Home Exercise Oxygen Prescription Continuous   Liters per minute 4   Home at Rest Exercise Oxygen Prescription None   Compliance with Home Oxygen Use Yes     Goals/Expected Outcomes   Short Term Goals To learn and exhibit compliance with exercise, home  and travel O2 prescription;To learn and understand importance of monitoring SPO2 with pulse oximeter and demonstrate accurate use of the pulse oximeter.;To Learn and understand importance of maintaining oxygen saturations>88%;To learn and demonstrate proper purse lipped breathing techniques or other breathing techniques.;To learn and demonstrate proper use of respiratory medications   Long  Term Goals Exhibits compliance with exercise, home and travel O2 prescription;Maintenance of O2 saturations>88%;Compliance with respiratory medication   Comments patient verbalizing compliance with home O2. Patient observed wearing his home o2 into rehab.      Initial Exercise Prescription:     Initial Exercise Prescription - 11/19/16 1600      Date of  Initial Exercise RX and Referring Provider   Date 11/19/16   Referring Provider Dr. [name]     Oxygen   Oxygen Continuous   Liters 6     Bike   Level 0.5   Minutes 17     NuStep   Level 2   Minutes 17   METs 1.5     Track   Laps 10   Minutes 17     Prescription Details   Frequency (times per week) 2   Duration Progress to 45 minutes of aerobic exercise without signs/symptoms of physical distress     Intensity   THRR 40-80% of Max Heartrate 58-115   Ratings of Perceived Exertion 11-13   Perceived Dyspnea 0-4     Progression   Progression Continue progressive overload as per policy without signs/symptoms or physical distress.     Resistance Training   Training Prescription Yes   Weight blue bands   Reps 10-15      Perform Capillary Blood Glucose checks as needed.  Exercise Prescription Changes:     Exercise Prescription Changes    Row Name 12/05/16 1200 12/12/16 1200 12/17/16 1200 12/19/16 1200 12/24/16 1200     Response to Exercise   Blood Pressure (Admit) 150/110  recheck 144/98 132/90 144/90 130/92 130/92   Blood Pressure (Exercise) 144/100 130/80 140/92 144/90 150/80   Blood Pressure (Exit) 126/76 134/90 138/96 140/82 130/86   Heart Rate (Admit) 91 bpm 84 bpm 86 bpm 90 bpm 87 bpm   Heart Rate (Exercise) 113 bpm 74 bpm 109 bpm 103 bpm 93 bpm   Heart Rate (Exit) 95 bpm 77 bpm 91 bpm 93 bpm 92 bpm   Oxygen Saturation (Admit) 98 % 97 % 93 % 90 % 97 %   Oxygen Saturation (Exercise) 88 % 94 % 90 % 90 % 91 %   Oxygen Saturation (Exit) 96 % 94 % 89 % 89 % 92 %   Rating of Perceived Exertion (Exercise) 10 13 13 13 13    Perceived Dyspnea (Exercise) 1 2 1 1 2    Duration Progress to 45 minutes of aerobic exercise without signs/symptoms of physical distress Progress to 45 minutes of aerobic exercise without signs/symptoms of physical distress Continue with 45 min of aerobic exercise without signs/symptoms of physical distress. Continue with 45 min of aerobic  exercise without signs/symptoms of physical distress. Continue with 45 min of aerobic exercise without signs/symptoms of physical distress.   Intensity -  40-80% HRR  - THRR unchanged THRR unchanged THRR unchanged     Progression   Progression Continue to progress workloads to maintain intensity without signs/symptoms of physical distress. Continue to progress workloads to maintain intensity without signs/symptoms of physical distress. Continue to progress workloads to maintain intensity without signs/symptoms of physical distress. Continue to progress workloads to maintain intensity without signs/symptoms  of physical distress. Continue to progress workloads to maintain intensity without signs/symptoms of physical distress.     Resistance Training   Training Prescription Yes Yes Yes Yes Yes   Weight blue bands blue bands blue bands blue bands blue bands   Reps 10-15 10-15 10-15 10-15 10-15   Time -  10 minutes -  10 minutes 10 Minutes 10 Minutes 10 Minutes     Interval Training   Interval Training No  -  -  -  -     Oxygen   Oxygen Continuous  - Continuous Continuous Continuous   Liters 4  - 4 4 4      Bike   Level  -  - 0.5  -  -   Minutes  -  - 17  -  -     NuStep   Level 2 2 3 3 5    Minutes 17 17 17 17  34   METs 1.7 1.7 2 2.2 2.3     Track   Laps 9 10  - 4 10   Minutes 17 17  - 17 17   Row Name 12/26/16 1234 12/31/16 1200 01/07/17 1200 01/09/17 1200 01/16/17 1200     Response to Exercise   Blood Pressure (Admit) 122/86 128/70 118/72 126/64 126/88   Blood Pressure (Exercise) 146/80 146/80 162/80 140/76 118/68   Blood Pressure (Exit) 104/70 100/60 122/78 106/60 98/64   Heart Rate (Admit) 87 bpm 83 bpm 83 bpm 86 bpm 91 bpm   Heart Rate (Exercise) 106 bpm 98 bpm 96 bpm 99 bpm 98 bpm   Heart Rate (Exit) 93 bpm 81 bpm 87 bpm 84 bpm 87 bpm   Oxygen Saturation (Admit) 98 % 95 % 94 % 98 % 97 %   Oxygen Saturation (Exercise) 89 % 89 % 88 % 86 % 94 %   Oxygen Saturation (Exit) 97  % 91 % 94 % 93 % 97 %   Rating of Perceived Exertion (Exercise) 13 11 13 11 11    Perceived Dyspnea (Exercise) 1 1 2 1  0   Duration Continue with 45 min of aerobic exercise without signs/symptoms of physical distress. Continue with 45 min of aerobic exercise without signs/symptoms of physical distress. Continue with 45 min of aerobic exercise without signs/symptoms of physical distress. Continue with 45 min of aerobic exercise without signs/symptoms of physical distress. Continue with 45 min of aerobic exercise without signs/symptoms of physical distress.   Intensity THRR unchanged THRR unchanged THRR unchanged THRR unchanged THRR unchanged     Progression   Progression Continue to progress workloads to maintain intensity without signs/symptoms of physical distress. Continue to progress workloads to maintain intensity without signs/symptoms of physical distress. Continue to progress workloads to maintain intensity without signs/symptoms of physical distress. Continue to progress workloads to maintain intensity without signs/symptoms of physical distress. Continue to progress workloads to maintain intensity without signs/symptoms of physical distress.     Resistance Training   Training Prescription Yes Yes Yes Yes Yes   Weight blue bands blue bands blue bands blue bands blue bands   Reps 10-15 10-15 10-15 10-15 10-15   Time 10 Minutes 10 Minutes 10 Minutes 10 Minutes 10 Minutes     Interval Training   Interval Training  -  -  - No No     Oxygen   Oxygen Continuous Continuous Continuous Continuous Continuous   Liters 4 4 4  4-5 4-5     Bike   Level 0.5 0.5 0.8 0.8 0.6  Minutes 17 17 17 17 17      NuStep   Level 5 5 5   - 5   Minutes 34 17 17  - 17   METs 2.3 2 2.4  - 2     Track   Laps  - 11 10 8   -   Minutes  - 17 17 17   -     Home Exercise Plan   Plans to continue exercise at  -  - Home (comment)  -  -   Frequency  -  - Add 3 additional days to program exercise sessions.  -  -    Row Name 01/21/17 1218 01/23/17 1200 01/28/17 1200 01/30/17 1200       Response to Exercise   Blood Pressure (Admit) 124/64 146/80 120/64 136/70    Blood Pressure (Exercise) 132/74 120/86 122/70 140/80    Blood Pressure (Exit) 108/60 116/64 124/74 104/60    Heart Rate (Admit) 83 bpm 82 bpm 92 bpm 88 bpm    Heart Rate (Exercise) 100 bpm 109 bpm 106 bpm 110 bpm    Heart Rate (Exit) 93 bpm 88 bpm 92 bpm 96 bpm    Oxygen Saturation (Admit) 97 % 98 % 96 % 98 %    Oxygen Saturation (Exercise) 88 % 86 % 84 % 89 %    Oxygen Saturation (Exit) 90 % 88 % 89 % 98 %    Rating of Perceived Exertion (Exercise) 13 11 13 13     Perceived Dyspnea (Exercise) 0 1 1 1     Duration Continue with 45 min of aerobic exercise without signs/symptoms of physical distress. Continue with 45 min of aerobic exercise without signs/symptoms of physical distress. Continue with 45 min of aerobic exercise without signs/symptoms of physical distress. Continue with 45 min of aerobic exercise without signs/symptoms of physical distress.    Intensity THRR unchanged THRR unchanged THRR unchanged THRR unchanged      Progression   Progression Continue to progress workloads to maintain intensity without signs/symptoms of physical distress. Continue to progress workloads to maintain intensity without signs/symptoms of physical distress. Continue to progress workloads to maintain intensity without signs/symptoms of physical distress. Continue to progress workloads to maintain intensity without signs/symptoms of physical distress.      Resistance Training   Training Prescription Yes Yes Yes Yes    Weight blue bands blue bands blue bands blue bands    Reps 10-15 10-15 10-15 10-15    Time 10 Minutes 10 Minutes 10 Minutes 10 Minutes      Interval Training   Interval Training No No No No      Oxygen   Oxygen Continuous Continuous Continuous Continuous    Liters 4-5 4-5 4-5 4-5      Bike   Level 0.8 0.8 0.8 0.8    Minutes 17 17 17 17        NuStep   Level 5  - 5 5    Minutes 17  - 17 17    METs 1.9  - 2.3 2.1      Track   Laps 13 16 10 11     Minutes 17 17 17 17        Exercise Comments:     Exercise Comments    Row Name 01/07/17 1633           Exercise Comments Home exercise completed          Exercise Goals and Review:   Exercise Goals Re-Evaluation :  Exercise Goals Re-Evaluation    Row Name 12/30/16 0707 01/27/17 1027           Exercise Goal Re-Evaluation   Exercise Goals Review Increase Strenth and Stamina;Increase Physical Activity Increase Physical Activity;Increase Strenth and Stamina      Comments Patient has had slow start due to bouts of hypertension and shoulder injury from fall. Will cont. to monitor and progress as appropriate.  Patient has had slow start due to bouts of hypertension and shoulder injury from fall. Will cont. to monitor and progress as appropriate.       Expected Outcomes Through exercising at rehab and at home, patient will increase physical strength and stamina and find ADL's easier to perform.  Through the exercise here at rehab the patient with increase physical capacity, strength, and stamina.         Discharge Exercise Prescription (Final Exercise Prescription Changes):     Exercise Prescription Changes - 01/30/17 1200      Response to Exercise   Blood Pressure (Admit) 136/70   Blood Pressure (Exercise) 140/80   Blood Pressure (Exit) 104/60   Heart Rate (Admit) 88 bpm   Heart Rate (Exercise) 110 bpm   Heart Rate (Exit) 96 bpm   Oxygen Saturation (Admit) 98 %   Oxygen Saturation (Exercise) 89 %   Oxygen Saturation (Exit) 98 %   Rating of Perceived Exertion (Exercise) 13   Perceived Dyspnea (Exercise) 1   Duration Continue with 45 min of aerobic exercise without signs/symptoms of physical distress.   Intensity THRR unchanged     Progression   Progression Continue to progress workloads to maintain intensity without signs/symptoms of physical  distress.     Resistance Training   Training Prescription Yes   Weight blue bands   Reps 10-15   Time 10 Minutes     Interval Training   Interval Training No     Oxygen   Oxygen Continuous   Liters 4-5     Bike   Level 0.8   Minutes 17     NuStep   Level 5   Minutes 17   METs 2.1     Track   Laps 11   Minutes 17      Nutrition:  Target Goals: Understanding of nutrition guidelines, daily intake of sodium <1570m, cholesterol <2063m calories 30% from fat and 7% or less from saturated fats, daily to have 5 or more servings of fruits and vegetables.  Biometrics:     Pre Biometrics - 11/15/16 1113      Pre Biometrics   Grip Strength 15 kg       Nutrition Therapy Plan and Nutrition Goals:   Nutrition Discharge: Rate Your Plate Scores:   Nutrition Goals Re-Evaluation:   Nutrition Goals Discharge (Final Nutrition Goals Re-Evaluation):   Psychosocial: Target Goals: Acknowledge presence or absence of significant depression and/or stress, maximize coping skills, provide positive support system. Participant is able to verbalize types and ability to use techniques and skills needed for reducing stress and depression.  Initial Review & Psychosocial Screening:     Initial Psych Review & Screening - 11/15/16 1136      Initial Review   Current issues with None Identified     Family Dynamics   Good Support System? Yes     Barriers   Psychosocial barriers to participate in program There are no identifiable barriers or psychosocial needs.     Screening Interventions   Interventions Encouraged to exercise  Quality of Life Scores:   PHQ-9: Recent Review Flowsheet Data    Depression screen [facility] 2/9 11/15/2016   Decreased Interest 0   Down, Depressed, Hopeless 0   PHQ - 2 Score 0     Interpretation of Total Score  Total Score Depression Severity:  1-4 = Minimal depression, 5-9 = Mild depression, 10-14 = Moderate depression, 15-19 = Moderately  severe depression, 20-27 = Severe depression   Psychosocial Evaluation and Intervention:     Psychosocial Evaluation - 11/15/16 1144      Psychosocial Evaluation & Interventions   Interventions Encouraged to exercise with the program and follow exercise prescription   Continue Psychosocial Services  No Follow up required      Psychosocial Re-Evaluation:     Psychosocial Re-Evaluation    [place] Name 12/31/16 0830 01/30/17 1304           Psychosocial Re-Evaluation   Current issues with Current Stress Concerns  -      Comments patient extremely concerned over his most recent diagnosis of hypertension patient more acceptance of hypertension diagnosis now that he sees his medication is working and his BP is controlled      Expected Outcomes through education and seeing improvement of his hypertension with medications patient will express resolved concern for hypertension through education and seeing improvement of his hypertension with medications patient will express resolved concern for hypertension      Interventions Encouraged to attend Pulmonary Rehabilitation for the exercise Encouraged to attend Pulmonary Rehabilitation for the exercise      Continue Psychosocial Services  Follow up required by staff Follow up required by staff        Initial Review   Source of Stress Concerns Chronic Illness;Unable to participate in former interests or hobbies;Unable to perform yard/household activities Chronic Illness;Unable to participate in former interests or hobbies;Unable to perform yard/household activities         Psychosocial Discharge (Final Psychosocial Re-Evaluation):     Psychosocial Re-Evaluation - 01/30/17 1304      Psychosocial Re-Evaluation   Comments patient more acceptance of hypertension diagnosis now that he sees his medication is working and his BP is controlled   Expected Outcomes through education and seeing improvement of his hypertension with medications patient will  express resolved concern for hypertension   Interventions Encouraged to attend Pulmonary Rehabilitation for the exercise   Continue Psychosocial Services  Follow up required by staff     Initial Review   Source of Stress Concerns Chronic Illness;Unable to participate in former interests or hobbies;Unable to perform yard/household activities      Education: Education Goals: Education classes will be provided on a weekly basis, covering required topics. Participant will state understanding/return demonstration of topics presented.  Learning Barriers/Preferences:     Learning Barriers/Preferences - 11/15/16 1134      Learning Barriers/Preferences   Learning Barriers None   Learning Preferences Computer/Internet;Group Instruction;Individual Instruction;Verbal Instruction;Written Material      Education Topics: Risk Factor Reduction:  -Group instruction that is supported by a PowerPoint presentation. Instructor discusses the definition of a risk factor, different risk factors for pulmonary disease, and how the heart and lungs work together.     Nutrition for Pulmonary Patient:  -Group instruction provided by PowerPoint slides, verbal discussion, and written materials to support subject matter. The instructor gives an explanation and review of healthy diet recommendations, which includes a discussion on weight management, recommendations for fruit and vegetable consumption, as well as protein, fluid, caffeine, fiber,  sodium, sugar, and alcohol. Tips for eating when patients are short of breath are discussed.   Pursed Lip Breathing:  -Group instruction that is supported by demonstration and informational handouts. Instructor discusses the benefits of pursed lip and diaphragmatic breathing and detailed demonstration on how to preform both.     Oxygen Safety:  -Group instruction provided by PowerPoint, verbal discussion, and written material to support subject matter. There is an overview  of "What is Oxygen" and "Why do we need it".  Instructor also reviews how to create a safe environment for oxygen use, the importance of using oxygen as prescribed, and the risks of noncompliance. There is a brief discussion on traveling with oxygen and resources the patient may utilize.   Oxygen Equipment:  -Group instruction provided by [facility] Staff utilizing handouts, written materials, and equipment demonstrations.   PULMONARY REHAB OTHER RESPIRATORY from 01/23/2017 in [place]  Date  01/16/17  Educator  [facility]  Instruction Review Code  2- meets goals/outcomes      Signs and Symptoms:  -Group instruction provided by written material and verbal discussion to support subject matter. Warning signs and symptoms of infection, stroke, and heart attack are reviewed and when to call the physician/911 reinforced. Tips for preventing the spread of infection discussed.   PULMONARY REHAB OTHER RESPIRATORY from 01/23/2017 in [place]  Date  12/26/16  Educator  rn  Instruction Review Code  2- meets goals/outcomes      Advanced Directives:  -Group instruction provided by verbal instruction and written material to support subject matter. Instructor reviews Advanced Directive laws and proper instruction for filling out document.   Pulmonary Video:  -Group video education that reviews the importance of medication and oxygen compliance, exercise, good nutrition, pulmonary hygiene, and pursed lip and diaphragmatic breathing for the pulmonary patient.   PULMONARY REHAB OTHER RESPIRATORY from 01/23/2017 in [place]  Date  12/05/16  Instruction Review Code  2- meets goals/outcomes      Exercise for the Pulmonary Patient:  -Group instruction that is supported by a PowerPoint presentation. Instructor discusses benefits of exercise, core components of exercise, frequency, duration, and  intensity of an exercise routine, importance of utilizing pulse oximetry during exercise, safety while exercising, and options of places to exercise outside of rehab.     PULMONARY REHAB OTHER RESPIRATORY from 01/23/2017 in [place]  Date  12/12/16  Educator  ep  Instruction Review Code  2- meets goals/outcomes      Pulmonary Medications:  -Verbally interactive group education provided by instructor with focus on inhaled medications and proper administration.   PULMONARY REHAB OTHER RESPIRATORY from 01/23/2017 in [place]  Date  01/09/17  Educator  Pharm  Instruction Review Code  2- meets goals/outcomes      Anatomy and Physiology of the Respiratory System and Intimacy:  -Group instruction provided by PowerPoint, verbal discussion, and written material to support subject matter. Instructor reviews respiratory cycle and anatomical components of the respiratory system and their functions. Instructor also reviews differences in obstructive and restrictive respiratory diseases with examples of each. Intimacy, Sex, and Sexuality differences are reviewed with a discussion on how relationships can change when diagnosed with pulmonary disease. Common sexual concerns are reviewed.   MD DAY -A group question and answer session with a medical doctor that allows participants to ask questions that relate to their pulmonary disease state.  OTHER EDUCATION -Group or individual verbal, written, or video instructions that support the educational goals of the pulmonary rehab program.   Knowledge Questionnaire Score:     Knowledge Questionnaire Score - 12/05/16 1557      Knowledge Questionnaire Score   Pre Score 10/13      Core Components/Risk Factors/Patient Goals at Admission:     Personal Goals and Risk Factors at Admission - 12/31/16 0830      Core Components/Risk Factors/Patient Goals on Admission   Hypertension Yes    Intervention Provide education on lifestyle modifcations including regular physical activity/exercise, weight management, moderate sodium restriction and increased consumption of fresh fruit, vegetables, and low fat dairy, alcohol moderation, and smoking cessation.;Monitor prescription use compliance.   Expected Outcomes Short Term: Continued assessment and intervention until BP is < 140/36m HG in hypertensive participants. < 130/823mHG in hypertensive participants with diabetes, heart failure or chronic kidney disease.;Long Term: Maintenance of blood pressure at goal levels.   Stress Yes   Intervention Offer individual and/or small group education and counseling on adjustment to heart disease, stress management and health-related lifestyle change. Teach and support self-help strategies.;Refer participants experiencing significant psychosocial distress to appropriate mental health specialists for further evaluation and treatment. When possible, include family members and significant others in education/counseling sessions.   Expected Outcomes Short Term: Participant demonstrates changes in health-related behavior, relaxation and other stress management skills, ability to obtain effective social support, and compliance with psychotropic medications if prescribed.;Long Term: Emotional wellbeing is indicated by absence of clinically significant psychosocial distress or social isolation.      Core Components/Risk Factors/Patient Goals Review:      Goals and Risk Factor Review    Row Name 12/31/16 0829 01/30/17 1304           Core Components/Risk Factors/Patient Goals Review   Personal Goals Review Weight Management/Obesity;Improve shortness of breath with ADL's;Develop more efficient breathing techniques such as purse lipped breathing and diaphragmatic breathing and practicing self-pacing with activity.;Hypertension;Stress Weight Management/Obesity;Improve shortness of breath with ADL's;Develop more  efficient breathing techniques such as purse lipped breathing and diaphragmatic breathing and practicing self-pacing with activity.;Hypertension;Stress      Review see comment section on ITP see comment section on ITP      Expected Outcomes see admission expected outcomes see admission expected outcomes         Core Components/Risk Factors/Patient Goals at Discharge (Final Review):      Goals and Risk Factor Review - 01/30/17 1304      Core Components/Risk Factors/Patient Goals Review   Personal Goals Review Weight Management/Obesity;Improve shortness of breath with ADL's;Develop more efficient breathing techniques such as purse lipped breathing and diaphragmatic breathing and practicing self-pacing with activity.;Hypertension;Stress   Review see comment section on ITP   Expected Outcomes see admission expected outcomes      ITP Comments:   Comments: ITP REVIEW Pt is making expected progress toward pulmonary rehab goals after completing 15 sessions. He is tolerating workload increases however he most recently injured his Lt shoulder from a fall. He continue to suffer with pain. It is affecting his rehab experience and he does not take medications as he is worried it may interfere with his OFEV. He was encouraged today to contact his pulmonologist to discuss pain medications that can be taken with OFEV and to contact the orthopedist to discuss why he continues to have pain in his shoulder. His weight has remained constant. He has lost 2 lbs since admission. Recommend continued exercise,  life style modification, education, and utilization of breathing techniques to increase stamina and strength and decrease shortness of breath with exertion.

## 2017-01-31 ENCOUNTER — Telehealth: Payer: Self-pay | Admitting: Pulmonary Disease

## 2017-01-31 NOTE — Telephone Encounter (Signed)
Pulmonary rehab he uses 6 liters and on 4 liters when he is on the machines---pt stated that he uses their tanks when he is at pulmonary rehab.   Pt stated that he was confused as to why he needs another POC.  He stated that his concentrator and his little POC at home both go up to 5 liters.  He stated that they advised him that the new one will go up to 6 liters.  BQ pt is aware that this will be addressed on Monday.  Please advise. Thanks

## 2017-02-03 NOTE — Telephone Encounter (Signed)
Has to do with the fact that his home machine is pulse and not continuous.  The nurses at rehab sent me several messages about this so I'm surprised he doesn't know the details.  If he feels OK then we can discuss more at the next visit, hold off on ordering a new POC for now.

## 2017-02-04 ENCOUNTER — Encounter (HOSPITAL_COMMUNITY)
Admission: RE | Admit: 2017-02-04 | Discharge: 2017-02-04 | Disposition: A | Discharge status: Home / Self Care | Payer: Medicare Other | Source: Ambulatory Visit | Attending: Pulmonary Disease | Admitting: Pulmonary Disease

## 2017-02-04 VITALS — Wt 213.6 lb

## 2017-02-04 DIAGNOSIS — J849 Interstitial pulmonary disease, unspecified: Secondary | ICD-10-CM

## 2017-02-04 NOTE — Progress Notes (Signed)
Daily Session Note  Patient Details  Name: Jeffrey Cobb MRN: [id number] Date of Birth: [birth year] Referring Provider:     Pulmonary Rehab Walk Test from 11/19/2016 in [place]  Referring Provider  Dr. [name]      Encounter Date: 02/04/2017  Check In:     Session Check In - 02/04/17 1014      Check-In   Location [facility]   Staff Present Trish Fountain, RN, Maxcine Ham, RN, BSN;Crit Obremski, MS, ACSM RCEP, Exercise Physiologist   Supervising physician immediately available to respond to emergencies [facility] immediately available   Physician(s) Dr. Posey Pronto   Medication changes reported     No   Fall or balance concerns reported    No   Tobacco Cessation No Change   Warm-up and Cool-down Performed as group-led instruction   Resistance Training Performed Yes   VAD Patient? No     Pain Assessment   Currently in Pain? No/denies   Multiple Pain Sites No      Capillary Blood Glucose: No results found for this or any previous visit (from the past 24 hour(s)).      Exercise Prescription Changes - 02/04/17 1100      Response to Exercise   Blood Pressure (Admit) 132/70   Blood Pressure (Exercise) 140/80   Blood Pressure (Exit) 118/60   Heart Rate (Admit) 88 bpm   Heart Rate (Exercise) 107 bpm   Heart Rate (Exit) 93 bpm   Oxygen Saturation (Admit) 94 %   Oxygen Saturation (Exercise) 89 %   Oxygen Saturation (Exit) 95 %   Rating of Perceived Exertion (Exercise) 11   Perceived Dyspnea (Exercise) 1   Duration Continue with 45 min of aerobic exercise without signs/symptoms of physical distress.   Intensity THRR unchanged     Progression   Progression Continue to progress workloads to maintain intensity without signs/symptoms of physical distress.     Resistance Training   Training Prescription Yes   Weight blue bands   Reps 10-15   Time 10 Minutes     Interval Training   Interval Training No     Oxygen   Oxygen Continuous   Liters 4-5     Bike   Level 0.8   Minutes 17     NuStep   Level 6   Minutes 17   METs 2.3     Track   Laps 11   Minutes 17      History  Smoking Status  . Former Smoker  . Packs/day: 3.00  . Years: 17.00  . Types: Cigarettes  . Quit date: 08/26/1977  Smokeless Tobacco  . Never Used    Goals Met:  Exercise tolerated well No report of cardiac concerns or symptoms Strength training completed today  Goals Unmet:  Not Applicable  Comments: Service time is from 10:30a to 12:00p    Dr. Rush Farmer is Medical Director for Pulmonary Rehab at [facility].

## 2017-02-04 NOTE — Telephone Encounter (Signed)
lmtcb x1 for pt's wife, Myriam JacobsonHelen.

## 2017-02-05 NOTE — Telephone Encounter (Signed)
Spoke with patient. Aware of rec's per Dr Kendrick FriesMcQuaid. Pt is okay with waiting until 7/31 OV to discuss POC need. Nothing further needed.

## 2017-02-06 ENCOUNTER — Encounter (HOSPITAL_COMMUNITY)
Admission: RE | Admit: 2017-02-06 | Discharge: 2017-02-06 | Disposition: A | Discharge status: Home / Self Care | Payer: Medicare Other | Source: Ambulatory Visit | Attending: Pulmonary Disease | Admitting: Pulmonary Disease

## 2017-02-06 ENCOUNTER — Encounter (HOSPITAL_COMMUNITY): Payer: Self-pay

## 2017-02-06 VITALS — Wt 214.3 lb

## 2017-02-06 DIAGNOSIS — J849 Interstitial pulmonary disease, unspecified: Secondary | ICD-10-CM | POA: Diagnosis not present

## 2017-02-06 NOTE — Progress Notes (Signed)
Daily Session Note  Patient Details  Name: Jeffrey Cobb MRN: [id number] Date of Birth: 1941/07/04 Referring Provider:     Pulmonary Rehab Walk Test from 11/19/2016 in [place]  Referring Provider  Dr. [name]      Encounter Date: 02/06/2017  Check In:     Session Check In - 02/06/17 1007      Check-In   Location [facility]   Staff Present Trish Fountain, RN, BSN;Karna Abed, MS, ACSM RCEP, Exercise Physiologist   Supervising physician immediately available to respond to emergencies [facility] immediately available   Physician(s) Dr. Sloan Leiter   Medication changes reported     No   Fall or balance concerns reported    No   Tobacco Cessation No Change   Warm-up and Cool-down Performed as group-led instruction   Resistance Training Performed Yes   VAD Patient? No     Pain Assessment   Currently in Pain? No/denies   Multiple Pain Sites No      Capillary Blood Glucose: No results found for this or any previous visit (from the past 24 hour(s)).      Exercise Prescription Changes - 02/06/17 1200      Response to Exercise   Blood Pressure (Admit) 130/80   Blood Pressure (Exercise) 150/96   Blood Pressure (Exit) 120/64   Heart Rate (Admit) 92 bpm   Heart Rate (Exercise) 117 bpm   Heart Rate (Exit) 106 bpm   Oxygen Saturation (Admit) 92 %   Oxygen Saturation (Exercise) 86 %   Oxygen Saturation (Exit) 89 %   Rating of Perceived Exertion (Exercise) 11   Perceived Dyspnea (Exercise) 1   Duration Continue with 45 min of aerobic exercise without signs/symptoms of physical distress.   Intensity THRR unchanged     Progression   Progression Continue to progress workloads to maintain intensity without signs/symptoms of physical distress.     Resistance Training   Training Prescription Yes   Weight blue bands   Reps 10-15   Time 10 Minutes     Interval Training   Interval Training No     Oxygen   Oxygen  Continuous   Liters 4-5     Bike   Level 1   Minutes 17     Track   Laps 12   Minutes 17      History  Smoking Status  . Former Smoker  . Packs/day: 3.00  . Years: 17.00  . Types: Cigarettes  . Quit date: 08/26/1977  Smokeless Tobacco  . Never Used    Goals Met:  Exercise tolerated well No report of cardiac concerns or symptoms Strength training completed today  Goals Unmet:  Not Applicable  Comments: Service time is from 10:30a to 12:28p    Dr. Rush Farmer is Medical Director for Pulmonary Rehab at [facility].

## 2017-02-11 ENCOUNTER — Encounter (HOSPITAL_COMMUNITY)
Admission: RE | Admit: 2017-02-11 | Discharge: 2017-02-11 | Disposition: A | Discharge status: Home / Self Care | Payer: Medicare Other | Source: Ambulatory Visit | Attending: Pulmonary Disease | Admitting: Pulmonary Disease

## 2017-02-11 VITALS — Wt 214.3 lb

## 2017-02-11 DIAGNOSIS — J849 Interstitial pulmonary disease, unspecified: Secondary | ICD-10-CM | POA: Diagnosis not present

## 2017-02-11 NOTE — Progress Notes (Signed)
Daily Session Note  Patient Details  Name: Jeffrey Cobb MRN: [id number] Date of Birth: 1941-03-11 Referring Provider:     Pulmonary Rehab Walk Test from 11/19/2016 in [place]  Referring Provider  Dr. [name]      Encounter Date: 02/11/2017  Check In:     Session Check In - 02/11/17 1008      Check-In   [facility]   Staff Present Rosebud Poles, RN, Luisa Hart, RN, BSN   Supervising physician immediately available to respond to emergencies [facility] immediately available   Physician(s) Dr. Allyson Sabal   Medication changes reported     No   Fall or balance concerns reported    No   Tobacco Cessation No Change   Warm-up and Cool-down Performed as group-led instruction   Resistance Training Performed Yes   VAD Patient? No     Pain Assessment   Currently in Pain? No/denies   Multiple Pain Sites No      Capillary Blood Glucose: No results found for this or any previous visit (from the past 24 hour(s)).      Exercise Prescription Changes - 02/11/17 1200      Response to Exercise   Blood Pressure (Admit) 134/64   Blood Pressure (Exercise) 140/76   Blood Pressure (Exit) 118/60   Heart Rate (Admit) 86 bpm   Heart Rate (Exercise) 102 bpm   Heart Rate (Exit) 93 bpm   Oxygen Saturation (Admit) 97 %   Oxygen Saturation (Exercise) 89 %   Oxygen Saturation (Exit) 90 %   Rating of Perceived Exertion (Exercise) 13   Perceived Dyspnea (Exercise) 2   Duration Continue with 45 min of aerobic exercise without signs/symptoms of physical distress.   Intensity THRR unchanged     Progression   Progression Continue to progress workloads to maintain intensity without signs/symptoms of physical distress.     Resistance Training   Training Prescription Yes   Weight blue bands   Reps 10-15   Time 10 Minutes     Interval Training   Interval Training No     Oxygen   Oxygen Continuous   Liters 4-5     Bike   Level 1   Minutes 17     NuStep   Level 6   Minutes 17   METs 2.4     Track   Laps 9   Minutes 17      History  Smoking Status  . Former Smoker  . Packs/day: 3.00  . Years: 17.00  . Types: Cigarettes  . Quit date: 08/26/1977  Smokeless Tobacco  . Never Used    Goals Met:  Exercise tolerated well No report of cardiac concerns or symptoms Strength training completed today  Goals Unmet:  Not Applicable  Comments: Service time is from 10:30a to 12:00p    Dr. Rush Farmer is Medical Director for Pulmonary Rehab at [facility].

## 2017-02-13 ENCOUNTER — Encounter (HOSPITAL_COMMUNITY)
Admission: RE | Admit: 2017-02-13 | Discharge: 2017-02-13 | Disposition: A | Discharge status: Home / Self Care | Payer: Medicare Other | Source: Ambulatory Visit | Attending: Pulmonary Disease | Admitting: Pulmonary Disease

## 2017-02-13 VITALS — Wt 215.6 lb

## 2017-02-13 DIAGNOSIS — J849 Interstitial pulmonary disease, unspecified: Secondary | ICD-10-CM

## 2017-02-13 NOTE — Progress Notes (Signed)
Daily Session Note  Patient Details  Name: Jeffrey Cobb MRN: [id number] Date of Birth: 1941/01/02 Referring Provider:     Pulmonary Rehab Walk Test from 11/19/2016 in [place]  Referring Provider  Dr. [name]      Encounter Date: 02/13/2017  Check In:     Session Check In - 02/13/17 1030      Check-In   Location [facility]   Staff Present Rosebud Poles, RN, Luisa Hart, RN, BSN   Supervising physician immediately available to respond to emergencies [facility] immediately available   Physician(s) Dr. Allyson Sabal   Medication changes reported     No   Fall or balance concerns reported    No   Tobacco Cessation No Change   Warm-up and Cool-down Performed as group-led instruction   Resistance Training Performed Yes   VAD Patient? No     Pain Assessment   Currently in Pain? No/denies   Multiple Pain Sites No      Capillary Blood Glucose: No results found for this or any previous visit (from the past 24 hour(s)).      Exercise Prescription Changes - 02/13/17 1200      Response to Exercise   Blood Pressure (Admit) 122/60   Blood Pressure (Exercise) 140/74   Blood Pressure (Exit) 130/86   Heart Rate (Admit) 90 bpm   Heart Rate (Exercise) 99 bpm   Heart Rate (Exit) 71 bpm   Oxygen Saturation (Admit) 87 %   Oxygen Saturation (Exercise) 89 %   Oxygen Saturation (Exit) 97 %   Rating of Perceived Exertion (Exercise) 11   Perceived Dyspnea (Exercise) 1   Duration Continue with 45 min of aerobic exercise without signs/symptoms of physical distress.   Intensity THRR unchanged     Progression   Progression Continue to progress workloads to maintain intensity without signs/symptoms of physical distress.     Resistance Training   Training Prescription Yes   Weight blue bands   Reps 10-15   Time 10 Minutes     Interval Training   Interval Training No     Oxygen   Oxygen Continuous   Liters 4-5     Bike   Level 1   Minutes 17     Track   Laps 9   Minutes 17      History  Smoking Status  . Former Smoker  . Packs/day: 3.00  . Years: 17.00  . Types: Cigarettes  . Quit date: 08/26/1977  Smokeless Tobacco  . Never Used    Goals Met:  Exercise tolerated well Strength training completed today  Goals Unmet:  Not Applicable  Comments: Service time is from 1030 to 1220    Dr. Rush Farmer is Medical Director for Pulmonary Rehab at [facility].

## 2017-02-18 ENCOUNTER — Encounter (HOSPITAL_COMMUNITY)
Admission: RE | Admit: 2017-02-18 | Discharge: 2017-02-18 | Disposition: A | Discharge status: Home / Self Care | Payer: Medicare Other | Source: Ambulatory Visit | Attending: Pulmonary Disease | Admitting: Pulmonary Disease

## 2017-02-18 VITALS — Wt 213.6 lb

## 2017-02-18 DIAGNOSIS — J849 Interstitial pulmonary disease, unspecified: Secondary | ICD-10-CM

## 2017-02-18 NOTE — Progress Notes (Signed)
Daily Session Note  Patient Details  Name: Jeffrey Cobb MRN: [id number] Date of Birth: Dec 30, 1940 Referring Provider:     Pulmonary Rehab Walk Test from 11/19/2016 in [place]  Referring Provider  Dr. [name]      Encounter Date: 02/18/2017  Check In:     Session Check In - 02/18/17 1014      Check-In   Location [facility]   Staff Present Rosebud Poles, RN, BSN;Nicholaos Schippers, MS, ACSM RCEP, Exercise Physiologist;Lisa Ysidro Evert, RN   Supervising physician immediately available to respond to emergencies [facility] immediately available   Physician(s) Dr. Allyson Sabal   Medication changes reported     No   Fall or balance concerns reported    No   Tobacco Cessation No Change   Warm-up and Cool-down Performed as group-led instruction   Resistance Training Performed Yes   VAD Patient? No     Pain Assessment   Currently in Pain? No/denies   Multiple Pain Sites No      Capillary Blood Glucose: No results found for this or any previous visit (from the past 24 hour(s)).      Exercise Prescription Changes - 02/18/17 1200      Response to Exercise   Blood Pressure (Admit) 136/70   Blood Pressure (Exercise) 128/80   Blood Pressure (Exit) 132/82   Heart Rate (Admit) 73 bpm   Heart Rate (Exercise) 97 bpm   Heart Rate (Exit) 84 bpm   Oxygen Saturation (Admit) 98 %   Oxygen Saturation (Exercise) 89 %   Oxygen Saturation (Exit) 95 %   Rating of Perceived Exertion (Exercise) 9   Perceived Dyspnea (Exercise) 1   Duration Continue with 45 min of aerobic exercise without signs/symptoms of physical distress.   Intensity THRR unchanged     Progression   Progression Continue to progress workloads to maintain intensity without signs/symptoms of physical distress.     Resistance Training   Training Prescription Yes   Weight blue bands   Reps 10-15   Time 10 Minutes     Interval Training   Interval Training No     Oxygen    Oxygen Continuous   Liters 4-5     Bike   Level 1   Minutes 17     NuStep   Level 6   Minutes 17   METs 2.6     Track   Laps 8   Minutes 17      History  Smoking Status  . Former Smoker  . Packs/day: 3.00  . Years: 17.00  . Types: Cigarettes  . Quit date: 08/26/1977  Smokeless Tobacco  . Never Used    Goals Met:  Exercise tolerated well No report of cardiac concerns or symptoms Strength training completed today  Goals Unmet:  Not Applicable  Comments: Service time is from 10:30a to 12:05p    Dr. Rush Farmer is Medical Director for Pulmonary Rehab at [facility].

## 2017-02-20 ENCOUNTER — Encounter (HOSPITAL_COMMUNITY)
Admission: RE | Admit: 2017-02-20 | Discharge: 2017-02-20 | Disposition: A | Discharge status: Home / Self Care | Payer: Medicare Other | Source: Ambulatory Visit | Attending: Pulmonary Disease | Admitting: Pulmonary Disease

## 2017-02-20 VITALS — Wt 213.0 lb

## 2017-02-20 DIAGNOSIS — J849 Interstitial pulmonary disease, unspecified: Secondary | ICD-10-CM

## 2017-02-20 NOTE — Progress Notes (Signed)
Daily Session Note  Patient Details  Name: Jeffrey Cobb MRN: [id number] Date of Birth: 1941/01/04 Referring Provider:     Pulmonary Rehab Walk Test from 11/19/2016 in [place]  Referring Provider  Dr. [name]      Encounter Date: 02/20/2017  Check In:     Session Check In - 02/20/17 1120      Check-In   Location [facility]   Staff Present Rodney Langton, RN;Molly diVincenzo, MS, ACSM RCEP, Exercise Physiologist;Annedrea Rosezella [place], RN, [name]   Supervising physician immediately available to respond to emergencies [facility] immediately available   Physician(s) Dr. Allyson Sabal   Medication changes reported     No   Fall or balance concerns reported    No   Tobacco Cessation No Change   Warm-up and Cool-down Performed as group-led instruction   Resistance Training Performed Yes   VAD Patient? No     Pain Assessment   Currently in Pain? No/denies   Pain Score 0-No pain      Capillary Blood Glucose: No results found for this or any previous visit (from the past 24 hour(s)).      Exercise Prescription Changes - 02/20/17 1200      Response to Exercise   Blood Pressure (Admit) 122/74   Blood Pressure (Exercise) 140/80   Blood Pressure (Exit) 128/84   Heart Rate (Admit) 88 bpm   Heart Rate (Exercise) 99 bpm   Heart Rate (Exit) 88 bpm   Oxygen Saturation (Admit) 93 %   Oxygen Saturation (Exercise) 89 %   Oxygen Saturation (Exit) 97 %   Rating of Perceived Exertion (Exercise) 13   Perceived Dyspnea (Exercise) 2   Duration Continue with 45 min of aerobic exercise without signs/symptoms of physical distress.   Intensity THRR unchanged     Progression   Progression Continue to progress workloads to maintain intensity without signs/symptoms of physical distress.     Resistance Training   Training Prescription Yes   Weight blue bands   Reps 10-15   Time 10 Minutes     Interval Training   Interval Training No     Oxygen   Oxygen Continuous   Liters 4-5     NuStep   Level 7   Minutes 17   METs 2.4     Track   Laps 9   Minutes 17      History  Smoking Status  . Former Smoker  . Packs/day: 3.00  . Years: 17.00  . Types: Cigarettes  . Quit date: 08/26/1977  Smokeless Tobacco  . Never Used    Goals Met:  Exercise tolerated well Strength training completed today  Goals Unmet:  Not Applicable  Comments: Service time is from 10:30 to 12:05    Dr. Rush Farmer is Medical Director for Pulmonary Rehab at [facility].

## 2017-02-25 ENCOUNTER — Encounter (HOSPITAL_COMMUNITY)
Admission: RE | Admit: 2017-02-25 | Discharge: 2017-02-25 | Disposition: A | Discharge status: Home / Self Care | Payer: Medicare Other | Source: Ambulatory Visit | Attending: Pulmonary Disease | Admitting: Pulmonary Disease

## 2017-02-25 VITALS — Wt 212.7 lb

## 2017-02-25 DIAGNOSIS — J849 Interstitial pulmonary disease, unspecified: Secondary | ICD-10-CM

## 2017-02-25 NOTE — Progress Notes (Signed)
Daily Session Note  Patient Details  Name: Jeffrey Cobb MRN: [id number] Date of Birth: 05/31/1941 Referring Provider:     Pulmonary Rehab Walk Test from 11/19/2016 in [name]  Referring Provider  Dr. [name]      Encounter Date: 02/25/2017  Check In:     Session Check In - 02/25/17 1035      Check-In   Location [facility]   Staff Present Trish Fountain, RN, Maxcine Ham, RN, BSN;Molly diVincenzo, MS, ACSM RCEP, Exercise Physiologist;Niema Carrara Ysidro Evert, RN   Supervising physician immediately available to respond to emergencies [facility] immediately available   Physician(s) Dr. Allyson Sabal   Medication changes reported     No   Fall or balance concerns reported    No   Tobacco Cessation No Change   Warm-up and Cool-down Performed as group-led instruction   Resistance Training Performed Yes   VAD Patient? No     Pain Assessment   Currently in Pain? No/denies   Multiple Pain Sites No      Capillary Blood Glucose: No results found for this or any previous visit (from the past 24 hour(s)).      Exercise Prescription Changes - 02/25/17 1200      Response to Exercise   Blood Pressure (Admit) 120/70   Blood Pressure (Exercise) 150/70   Blood Pressure (Exit) 100/70   Heart Rate (Admit) 93 bpm   Heart Rate (Exercise) 110 bpm   Heart Rate (Exit) 95 bpm   Oxygen Saturation (Admit) 96 %   Oxygen Saturation (Exercise) 83 %  O2 increased to 8L sat improved to 87% on TM   Oxygen Saturation (Exit) 96 %   Rating of Perceived Exertion (Exercise) 13   Perceived Dyspnea (Exercise) 1   Duration Continue with 45 min of aerobic exercise without signs/symptoms of physical distress.   Intensity THRR unchanged     Progression   Progression Continue to progress workloads to maintain intensity without signs/symptoms of physical distress.     Resistance Training   Training Prescription Yes   Weight blue bands   Reps 10-15   Time 10  Minutes     Interval Training   Interval Training No     Oxygen   Oxygen Continuous   Liters 8     Treadmill   MPH 1.8   Grade 0   Minutes 17     Bike   Level 1   Minutes 17     NuStep   Level 7   Minutes 17   METs 2.6      History  Smoking Status  . Former Smoker  . Packs/day: 3.00  . Years: 17.00  . Types: Cigarettes  . Quit date: 08/26/1977  Smokeless Tobacco  . Never Used    Goals Met:  Exercise tolerated well No report of cardiac concerns or symptoms Strength training completed today  Goals Unmet:  Not Applicable  Comments: Service time is from 1030 to 1210    Dr. Rush Farmer is Medical Director for Pulmonary Rehab at [facility]

## 2017-02-27 ENCOUNTER — Encounter (HOSPITAL_COMMUNITY)
Admission: RE | Admit: 2017-02-27 | Discharge: 2017-02-27 | Disposition: A | Discharge status: Home / Self Care | Payer: Medicare Other | Source: Ambulatory Visit | Attending: Pulmonary Disease | Admitting: Pulmonary Disease

## 2017-02-27 VITALS — Wt 212.3 lb

## 2017-02-27 DIAGNOSIS — J849 Interstitial pulmonary disease, unspecified: Secondary | ICD-10-CM | POA: Diagnosis not present

## 2017-02-27 NOTE — Progress Notes (Signed)
Pulmonary Individual Treatment Plan  Patient Details  Name: Jeffrey Cobb MRN: [id number] Date of Birth: Jan 22, 1941 Referring Provider:     Pulmonary Rehab Walk Test from 11/19/2016 in [place]  Referring Provider  Dr. [name]      Initial Encounter Date:    Pulmonary Rehab Walk Test from 11/19/2016 in [place]  Date  11/19/16  Referring Provider  Dr. [name]      Visit Diagnosis: ILD (interstitial lung disease) ([place])  Patient's Home Medications on Admission:   Current Outpatient Prescriptions:  .  albuterol (PROVENTIL) (2.5 MG/3ML) 0.083% nebulizer solution, Take 3 mLs (2.5 mg total) by nebulization every 6 (six) hours as needed for wheezing or shortness of breath., Disp: 360 mL, Rfl: 11 .  aspirin 81 MG tablet, Take 81 mg by mouth daily., Disp: , Rfl:  .  atorvastatin (LIPITOR) 40 MG tablet, Take 1 tablet (40 mg total) by mouth daily at 6 PM., Disp: 30 tablet, Rfl: 0 .  cyanocobalamin 500 MCG tablet, Take 1,000 mcg by mouth daily., Disp: , Rfl:  .  meloxicam (MOBIC) 15 MG tablet, Take 1 tablet by mouth daily., Disp: , Rfl:  .  Nintedanib (OFEV) 150 MG CAPS, Take 150 mg by mouth 2 (two) times daily., Disp: 60 capsule, Rfl:  .  oxymetazoline (AFRIN) 0.05 % nasal spray, Place 1 spray into both nostrils as needed for congestion., Disp: , Rfl:  .  pramipexole (MIRAPEX) 0.5 MG tablet, TAKE 1 TABLET BY MOUTH EVERY MORNING AND TAKE 1 TABLET EVERY EVENING, Disp: 180 tablet, Rfl: 3 .  PROAIR HFA 108 (90 BASE) MCG/ACT inhaler, Inhale 2 puffs into the lungs every 6 (six) hours as needed., Disp: , Rfl:  .  QUEtiapine Fumarate (SEROQUEL PO), Take 50 mg by mouth at bedtime., Disp: , Rfl:  .  triamcinolone cream (KENALOG) 0.1 %, APPLY 1 APPLICATION ON THE SKIN TWICE A DAY AS NEEDED, Disp: , Rfl: 2  Past Medical History: Past Medical History:  Diagnosis Date  . Acute sinusitis, unspecified   . Arthritis   . Complication of  anesthesia    decveloped sleep problems after knee surgery2001  . GERD (gastroesophageal reflux disease)   . Left anterior fascicular block    hx of on ekg  . Mixed hyperlipidemia   . Obstructive chronic bronchitis with exacerbation ([place])   . Occlusion and stenosis of carotid artery without mention of cerebral infarction    a. 05/2013 Carotid U/S: 1-39% bilat stenosis.  . Other emphysema ([place])   . 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 Sleep apnea    does not use cpap due to cough  . Stroke ([place]) 05/2015    Tobacco Use: History  Smoking Status  . Former Smoker  . Packs/day: 3.00  . Years: 17.00  . Types: Cigarettes  . Quit date: 08/26/1977  Smokeless Tobacco  . Never Used    Labs: Recent Review Flowsheet Data    Labs for ITP Cardiac and Pulmonary Rehab Latest Ref Rng & Units 10/27/2007 11/01/2008 01/17/2011 06/01/2015   Cholestrol 0 - 200 mg/dL 175 169 165 170   LDLCALC 0 - 99 mg/dL 108(H) 105(H) 90 103(H)   HDL >40 mg/dL 57.8 52.5 67.40 58   Trlycerides <150 mg/dL 44 59 40.0 46   Hemoglobin A1c 4.8 - 5.6 % - - - 5.7(H)      Capillary Blood Glucose: No results found for: GLUCAP  ADL UCSD:     Pulmonary Assessment Scores    Row Name 12/05/16 1558 01/07/17 1525       ADL UCSD   ADL Phase Entry  -    SOB Score total 100  -      CAT Score   CAT Score  - 26  Pre       Pulmonary Function Assessment:     Pulmonary Function Assessment - 11/15/16 1135      Breath   Bilateral Breath Sounds --  fine crackles R middle with deep inspiration   Shortness of Breath Yes;Limiting activity      Exercise Target Goals:    Exercise Program Goal: Individual exercise prescription set with THRR, safety & activity barriers. Participant demonstrates ability to understand and report RPE using BORG scale, to self-measure pulse accurately, and to acknowledge the importance of the exercise prescription.  Exercise Prescription Goal: Starting  with aerobic activity 30 plus minutes a day, 3 days per week for initial exercise prescription. Provide home exercise prescription and guidelines that participant acknowledges understanding prior to discharge.  Activity Barriers & Risk Stratification:   6 Minute Walk:     6 Minute Walk    Row Name 11/19/16 1612         6 Minute Walk   Phase Initial     Distance 1230 feet     Walk Time 6 minutes     # of Rest Breaks 0     MPH 2.32     METS 2.76     RPE 12     Perceived Dyspnea  0     Symptoms No     Resting HR 88 bpm     Resting BP 138/90     Max Ex. HR 110 bpm     Max Ex. BP 150/70       Interval HR   Baseline HR 88     1 Minute HR 94     2 Minute HR 102     3 Minute HR 96     4 Minute HR 101     5 Minute HR 100     6 Minute HR 110     2 Minute Post HR 100     Interval Heart Rate? Yes       Interval Oxygen   Interval Oxygen? Yes     Baseline Oxygen Saturation % 94 %     Baseline Liters of Oxygen 4 L     1 Minute Oxygen Saturation % 94 %     1 Minute Liters of Oxygen 4 L     2 Minute Oxygen Saturation % 89 %     2 Minute Liters of Oxygen 4 L     3 Minute Oxygen Saturation % 86 %     3 Minute Liters of Oxygen 4 L     4 Minute Oxygen Saturation % 89 %     4 Minute Liters of Oxygen 6 L     5 Minute Oxygen Saturation % 89 %     5 Minute Liters of Oxygen 6 L     6 Minute Oxygen Saturation % 88 %     6 Minute Liters of Oxygen 6 L     2 Minute Post Oxygen Saturation % 93 %     2 Minute Post Liters of Oxygen 6 L        Oxygen Initial Assessment:     Oxygen Initial Assessment -  11/15/16 1100      Home Oxygen   Home Oxygen Device Home Concentrator;E-Tanks   Sleep Oxygen Prescription None   Home Exercise Oxygen Prescription Continuous   Liters per minute 4   Home at Rest Exercise Oxygen Prescription None   Compliance with Home Oxygen Use No   Comments --  does not wear with exertion as he should     Intervention   Short Term Goals To learn and exhibit  compliance with exercise, home and travel O2 prescription;To learn and understand importance of monitoring SPO2 with pulse oximeter and demonstrate accurate use of the pulse oximeter.;To Learn and understand importance of maintaining oxygen saturations>88%;To learn and demonstrate proper purse lipped breathing techniques or other breathing techniques.;To learn and demonstrate proper use of respiratory medications   Long  Term Goals Exhibits compliance with exercise, home and travel O2 prescription;Maintenance of O2 saturations>88%;Compliance with respiratory medication      Oxygen Re-Evaluation:     Oxygen Re-Evaluation    Row Name 12/31/16 0827 01/30/17 1301 02/25/17 1549         Program Oxygen Prescription   Program Oxygen Prescription Continuous Continuous Continuous     Liters per minute 6 6 6      Comments  -  - he has had an increase to 8 lpm when walking the track to maintain an oxygen saturation >88-90%       Home Oxygen   Home Oxygen Device Home Concentrator;E-Tanks Home Concentrator;E-Tanks Home Concentrator;E-Tanks     Sleep Oxygen Prescription None None None     Home Exercise Oxygen Prescription Continuous Continuous Continuous     Liters per minute 4 4 4      Home at Rest Exercise Oxygen Prescription None None None     Compliance with Home Oxygen Use Yes Yes Yes     Comments -  patient now compliant with home oxygen prescription  -  -       Goals/Expected Outcomes   Short Term Goals To learn and exhibit compliance with exercise, home and travel O2 prescription;To learn and understand importance of monitoring SPO2 with pulse oximeter and demonstrate accurate use of the pulse oximeter.;To Learn and understand importance of maintaining oxygen saturations>88%;To learn and demonstrate proper purse lipped breathing techniques or other breathing techniques.;To learn and demonstrate proper use of respiratory medications To learn and exhibit compliance with exercise, home and  travel O2 prescription;To learn and understand importance of monitoring SPO2 with pulse oximeter and demonstrate accurate use of the pulse oximeter.;To Learn and understand importance of maintaining oxygen saturations>88%;To learn and demonstrate proper purse lipped breathing techniques or other breathing techniques.;To learn and demonstrate proper use of respiratory medications To learn and exhibit compliance with exercise, home and travel O2 prescription;To learn and understand importance of monitoring SPO2 with pulse oximeter and demonstrate accurate use of the pulse oximeter.;To Learn and understand importance of maintaining oxygen saturations>88%;To learn and demonstrate proper purse lipped breathing techniques or other breathing techniques.;To learn and demonstrate proper use of respiratory medications     Long  Term Goals Exhibits compliance with exercise, home and travel O2 prescription;Maintenance of O2 saturations>88%;Compliance with respiratory medication Exhibits compliance with exercise, home and travel O2 prescription;Maintenance of O2 saturations>88%;Compliance with respiratory medication Exhibits compliance with exercise, home and travel O2 prescription;Maintenance of O2 saturations>88%;Compliance with respiratory medication     Comments  - patient verbalizing compliance with home O2. Patient observed wearing his home o2 into rehab. patient verbalizing compliance with home O2. Patient observed wearing his  home o2 into rehab. he is worried about his increased need for more oxygen as his workloads increase.        Oxygen Discharge (Final Oxygen Re-Evaluation):     Oxygen Re-Evaluation - 02/25/17 1549      Program Oxygen Prescription   Program Oxygen Prescription Continuous   Liters per minute 6   Comments he has had an increase to 8 lpm when walking the track to maintain an oxygen saturation >88-90%     Home Oxygen   Home Oxygen Device Home Concentrator;E-Tanks   Sleep  Oxygen Prescription None   Home Exercise Oxygen Prescription Continuous   Liters per minute 4   Home at Rest Exercise Oxygen Prescription None   Compliance with Home Oxygen Use Yes     Goals/Expected Outcomes   Short Term Goals To learn and exhibit compliance with exercise, home and travel O2 prescription;To learn and understand importance of monitoring SPO2 with pulse oximeter and demonstrate accurate use of the pulse oximeter.;To Learn and understand importance of maintaining oxygen saturations>88%;To learn and demonstrate proper purse lipped breathing techniques or other breathing techniques.;To learn and demonstrate proper use of respiratory medications   Long  Term Goals Exhibits compliance with exercise, home and travel O2 prescription;Maintenance of O2 saturations>88%;Compliance with respiratory medication   Comments patient verbalizing compliance with home O2. Patient observed wearing his home o2 into rehab. he is worried about his increased need for more oxygen as his workloads increase.      Initial Exercise Prescription:     Initial Exercise Prescription - 11/19/16 1600      Date of Initial Exercise RX and Referring Provider   Date 11/19/16   Referring Provider Dr. [name]     Oxygen   Oxygen Continuous   Liters 6     Bike   Level 0.5   Minutes 17     NuStep   Level 2   Minutes 17   METs 1.5     Track   Laps 10   Minutes 17     Prescription Details   Frequency (times per week) 2   Duration Progress to 45 minutes of aerobic exercise without signs/symptoms of physical distress     Intensity   THRR 40-80% of Max Heartrate 58-115   Ratings of Perceived Exertion 11-13   Perceived Dyspnea 0-4     Progression   Progression Continue progressive overload as per policy without signs/symptoms or physical distress.     Resistance Training   Training Prescription Yes   Weight blue bands   Reps 10-15      Perform Capillary Blood Glucose checks as  needed.  Exercise Prescription Changes:     Exercise Prescription Changes    Row Name 12/05/16 1200 12/12/16 1200 12/17/16 1200 12/19/16 1200 12/24/16 1200     Response to Exercise   Blood Pressure (Admit) 150/110  recheck 144/98 132/90 144/90 130/92 130/92   Blood Pressure (Exercise) 144/100 130/80 140/92 144/90 150/80   Blood Pressure (Exit) 126/76 134/90 138/96 140/82 130/86   Heart Rate (Admit) 91 bpm 84 bpm 86 bpm 90 bpm 87 bpm   Heart Rate (Exercise) 113 bpm 74 bpm 109 bpm 103 bpm 93 bpm   Heart Rate (Exit) 95 bpm 77 bpm 91 bpm 93 bpm 92 bpm   Oxygen Saturation (Admit) 98 % 97 % 93 % 90 % 97 %   Oxygen Saturation (Exercise) 88 % 94 % 90 % 90 % 91 %   Oxygen Saturation (Exit) 96 %  94 % 89 % 89 % 92 %   Rating of Perceived Exertion (Exercise) 10 13 13 13 13    Perceived Dyspnea (Exercise) 1 2 1 1 2    Duration Progress to 45 minutes of aerobic exercise without signs/symptoms of physical distress Progress to 45 minutes of aerobic exercise without signs/symptoms of physical distress Continue with 45 min of aerobic exercise without signs/symptoms of physical distress. Continue with 45 min of aerobic exercise without signs/symptoms of physical distress. Continue with 45 min of aerobic exercise without signs/symptoms of physical distress.   Intensity -  40-80% HRR  - THRR unchanged THRR unchanged THRR unchanged     Progression   Progression Continue to progress workloads to maintain intensity without signs/symptoms of physical distress. Continue to progress workloads to maintain intensity without signs/symptoms of physical distress. Continue to progress workloads to maintain intensity without signs/symptoms of physical distress. Continue to progress workloads to maintain intensity without signs/symptoms of physical distress. Continue to progress workloads to maintain intensity without signs/symptoms of physical distress.     Resistance Training   Training Prescription Yes Yes Yes Yes Yes    Weight blue bands blue bands blue bands blue bands blue bands   Reps 10-15 10-15 10-15 10-15 10-15   Time -  10 minutes -  10 minutes 10 Minutes 10 Minutes 10 Minutes     Interval Training   Interval Training No  -  -  -  -     Oxygen   Oxygen Continuous  - Continuous Continuous Continuous   Liters 4  - 4 4 4      Bike   Level  -  - 0.5  -  -   Minutes  -  - 17  -  -     NuStep   Level 2 2 3 3 5    Minutes 17 17 17 17  34   METs 1.7 1.7 2 2.2 2.3     Track   Laps 9 10  - 4 10   Minutes 17 17  - 17 17   Row Name 12/26/16 1234 12/31/16 1200 01/07/17 1200 01/09/17 1200 01/16/17 1200     Response to Exercise   Blood Pressure (Admit) 122/86 128/70 118/72 126/64 126/88   Blood Pressure (Exercise) 146/80 146/80 162/80 140/76 118/68   Blood Pressure (Exit) 104/70 100/60 122/78 106/60 98/64   Heart Rate (Admit) 87 bpm 83 bpm 83 bpm 86 bpm 91 bpm   Heart Rate (Exercise) 106 bpm 98 bpm 96 bpm 99 bpm 98 bpm   Heart Rate (Exit) 93 bpm 81 bpm 87 bpm 84 bpm 87 bpm   Oxygen Saturation (Admit) 98 % 95 % 94 % 98 % 97 %   Oxygen Saturation (Exercise) 89 % 89 % 88 % 86 % 94 %   Oxygen Saturation (Exit) 97 % 91 % 94 % 93 % 97 %   Rating of Perceived Exertion (Exercise) 13 11 13 11 11    Perceived Dyspnea (Exercise) 1 1 2 1  0   Duration Continue with 45 min of aerobic exercise without signs/symptoms of physical distress. Continue with 45 min of aerobic exercise without signs/symptoms of physical distress. Continue with 45 min of aerobic exercise without signs/symptoms of physical distress. Continue with 45 min of aerobic exercise without signs/symptoms of physical distress. Continue with 45 min of aerobic exercise without signs/symptoms of physical distress.   Intensity THRR unchanged THRR unchanged THRR unchanged THRR unchanged THRR unchanged     Progression   Progression  Continue to progress workloads to maintain intensity without signs/symptoms of physical distress. Continue to progress workloads  to maintain intensity without signs/symptoms of physical distress. Continue to progress workloads to maintain intensity without signs/symptoms of physical distress. Continue to progress workloads to maintain intensity without signs/symptoms of physical distress. Continue to progress workloads to maintain intensity without signs/symptoms of physical distress.     Resistance Training   Training Prescription Yes Yes Yes Yes Yes   Weight blue bands blue bands blue bands blue bands blue bands   Reps 10-15 10-15 10-15 10-15 10-15   Time 10 Minutes 10 Minutes 10 Minutes 10 Minutes 10 Minutes     Interval Training   Interval Training  -  -  - No No     Oxygen   Oxygen Continuous Continuous Continuous Continuous Continuous   Liters 4 4 4  4-5 4-5     Bike   Level 0.5 0.5 0.8 0.8 0.6   Minutes 17 17 17 17 17      NuStep   Level 5 5 5   - 5   Minutes 34 17 17  - 17   METs 2.3 2 2.4  - 2     Track   Laps  - 11 10 8   -   Minutes  - 17 17 17   -     Home Exercise Plan   Plans to continue exercise at  -  - Home (comment)  -  -   Frequency  -  - Add 3 additional days to program exercise sessions.  -  -   Row Name 01/21/17 1218 01/23/17 1200 01/28/17 1200 01/30/17 1200 02/04/17 1100     Response to Exercise   Blood Pressure (Admit) 124/64 146/80 120/64 136/70 132/70   Blood Pressure (Exercise) 132/74 120/86 122/70 140/80 140/80   Blood Pressure (Exit) 108/60 116/64 124/74 104/60 118/60   Heart Rate (Admit) 83 bpm 82 bpm 92 bpm 88 bpm 88 bpm   Heart Rate (Exercise) 100 bpm 109 bpm 106 bpm 110 bpm 107 bpm   Heart Rate (Exit) 93 bpm 88 bpm 92 bpm 96 bpm 93 bpm   Oxygen Saturation (Admit) 97 % 98 % 96 % 98 % 94 %   Oxygen Saturation (Exercise) 88 % 86 % 84 % 89 % 89 %   Oxygen Saturation (Exit) 90 % 88 % 89 % 98 % 95 %   Rating of Perceived Exertion (Exercise) 13 11 13 13 11    Perceived Dyspnea (Exercise) 0 1 1 1 1    Duration Continue with 45 min of aerobic exercise without signs/symptoms of  physical distress. Continue with 45 min of aerobic exercise without signs/symptoms of physical distress. Continue with 45 min of aerobic exercise without signs/symptoms of physical distress. Continue with 45 min of aerobic exercise without signs/symptoms of physical distress. Continue with 45 min of aerobic exercise without signs/symptoms of physical distress.   Intensity THRR unchanged THRR unchanged THRR unchanged THRR unchanged THRR unchanged     Progression   Progression Continue to progress workloads to maintain intensity without signs/symptoms of physical distress. Continue to progress workloads to maintain intensity without signs/symptoms of physical distress. Continue to progress workloads to maintain intensity without signs/symptoms of physical distress. Continue to progress workloads to maintain intensity without signs/symptoms of physical distress. Continue to progress workloads to maintain intensity without signs/symptoms of physical distress.     Resistance Training   Training Prescription Yes Yes Yes Yes Yes   Weight blue bands blue bands  blue bands blue bands blue bands   Reps 10-15 10-15 10-15 10-15 10-15   Time 10 Minutes 10 Minutes 10 Minutes 10 Minutes 10 Minutes     Interval Training   Interval Training No No No No No     Oxygen   Oxygen Continuous Continuous Continuous Continuous Continuous   Liters 4-5 4-5 4-5 4-5 4-5     Bike   Level 0.8 0.8 0.8 0.8 0.8   Minutes 17 17 17 17 17      NuStep   Level 5  - 5 5 6    Minutes 17  - 17 17 17    METs 1.9  - 2.3 2.1 2.3     Track   Laps 13 16 10 11 11    Minutes 17 17 17 17 17    Row Name 02/06/17 1200 02/11/17 1200 02/13/17 1200 02/18/17 1200 02/20/17 1200     Response to Exercise   Blood Pressure (Admit) 130/80 134/64 122/60 136/70 122/74   Blood Pressure (Exercise) 150/96 140/76 140/74 128/80 140/80   Blood Pressure (Exit) 120/64 118/60 130/86 132/82 128/84   Heart Rate (Admit) 92 bpm 86 bpm 90 bpm 73 bpm 88 bpm    Heart Rate (Exercise) 117 bpm 102 bpm 99 bpm 97 bpm 99 bpm   Heart Rate (Exit) 106 bpm 93 bpm 71 bpm 84 bpm 88 bpm   Oxygen Saturation (Admit) 92 % 97 % 87 % 98 % 93 %   Oxygen Saturation (Exercise) 86 % 89 % 89 % 89 % 89 %   Oxygen Saturation (Exit) 89 % 90 % 97 % 95 % 97 %   Rating of Perceived Exertion (Exercise) 11 13 11 9 13    Perceived Dyspnea (Exercise) 1 2 1 1 2    Duration Continue with 45 min of aerobic exercise without signs/symptoms of physical distress. Continue with 45 min of aerobic exercise without signs/symptoms of physical distress. Continue with 45 min of aerobic exercise without signs/symptoms of physical distress. Continue with 45 min of aerobic exercise without signs/symptoms of physical distress. Continue with 45 min of aerobic exercise without signs/symptoms of physical distress.   Intensity THRR unchanged THRR unchanged THRR unchanged THRR unchanged THRR unchanged     Progression   Progression Continue to progress workloads to maintain intensity without signs/symptoms of physical distress. Continue to progress workloads to maintain intensity without signs/symptoms of physical distress. Continue to progress workloads to maintain intensity without signs/symptoms of physical distress. Continue to progress workloads to maintain intensity without signs/symptoms of physical distress. Continue to progress workloads to maintain intensity without signs/symptoms of physical distress.     Resistance Training   Training Prescription Yes Yes Yes Yes Yes   Weight blue bands blue bands blue bands blue bands blue bands   Reps 10-15 10-15 10-15 10-15 10-15   Time 10 Minutes 10 Minutes 10 Minutes 10 Minutes 10 Minutes     Interval Training   Interval Training No No No No No     Oxygen   Oxygen Continuous Continuous Continuous Continuous Continuous   Liters 4-5 4-5 4-5 4-5 8      Bike   Level 1 1 1 1   -   Minutes 17 17 17 17   -     NuStep   Level  - 6  - 6 7   Minutes  - 17  - 17  17   METs  - 2.4  - 2.6 2.4     Track   Laps 12 9  9 8 9    Minutes 17 17 17 17 17    Row Name 02/25/17 1200 02/27/17 1200           Response to Exercise   Blood Pressure (Admit) 120/70 122/70      Blood Pressure (Exercise) 150/70 150/80      Blood Pressure (Exit) 100/70 114/70      Heart Rate (Admit) 93 bpm 92 bpm      Heart Rate (Exercise) 110 bpm 107 bpm      Heart Rate (Exit) 95 bpm 92 bpm      Oxygen Saturation (Admit) 96 % 98 %      Oxygen Saturation (Exercise) 83 %  O2 increased to 8L sat improved to 87% on TM 89 %      Oxygen Saturation (Exit) 96 % 96 %      Rating of Perceived Exertion (Exercise) 13 11      Perceived Dyspnea (Exercise) 1 0      Duration Continue with 45 min of aerobic exercise without signs/symptoms of physical distress. Continue with 45 min of aerobic exercise without signs/symptoms of physical distress.      Intensity THRR unchanged THRR unchanged        Progression   Progression Continue to progress workloads to maintain intensity without signs/symptoms of physical distress. Continue to progress workloads to maintain intensity without signs/symptoms of physical distress.        Resistance Training   Training Prescription Yes Yes      Weight blue bands blue bands      Reps 10-15 10-15      Time 10 Minutes 10 Minutes        Interval Training   Interval Training No No        Oxygen   Oxygen Continuous Continuous      Liters 8 8        Treadmill   MPH 1.8 1.8      Grade 0 0      Minutes 17 17        Bike   Level 1 1.2      Minutes 17 17        NuStep   Level 7  -      Minutes 17  -      METs 2.6  -         Exercise Comments:     Exercise Comments    Row Name 01/07/17 1633           Exercise Comments Home exercise completed          Exercise Goals and Review:   Exercise Goals Re-Evaluation :     Exercise Goals Re-Evaluation    Row Name 12/30/16 0707 01/27/17 1027 02/25/17 1556         Exercise Goal Re-Evaluation    Exercise Goals Review Increase Strenth and Stamina;Increase Physical Activity Increase Physical Activity;Increase Strenth and Stamina Increase Strenth and Stamina;Increase Physical Activity     Comments Patient has had slow start due to bouts of hypertension and shoulder injury from fall. Will cont. to monitor and progress as appropriate.  Patient has had slow start due to bouts of hypertension and shoulder injury from fall. Will cont. to monitor and progress as appropriate.  Patient has advanced to using the treadmill. MET average ranges from 2.4-2.6. Home exercise reviewed. Will cont to monitor and progress.      Expected Outcomes Through exercising at rehab and at home, patient will  increase physical strength and stamina and find ADL's easier to perform.  Through the exercise here at rehab the patient with increase physical capacity, strength, and stamina. Through the exercise here at rehab the patient with increase physical capacity, strength, and stamina.        Discharge Exercise Prescription (Final Exercise Prescription Changes):     Exercise Prescription Changes - 02/27/17 1200      Response to Exercise   Blood Pressure (Admit) 122/70   Blood Pressure (Exercise) 150/80   Blood Pressure (Exit) 114/70   Heart Rate (Admit) 92 bpm   Heart Rate (Exercise) 107 bpm   Heart Rate (Exit) 92 bpm   Oxygen Saturation (Admit) 98 %   Oxygen Saturation (Exercise) 89 %   Oxygen Saturation (Exit) 96 %   Rating of Perceived Exertion (Exercise) 11   Perceived Dyspnea (Exercise) 0   Duration Continue with 45 min of aerobic exercise without signs/symptoms of physical distress.   Intensity THRR unchanged     Progression   Progression Continue to progress workloads to maintain intensity without signs/symptoms of physical distress.     Resistance Training   Training Prescription Yes   Weight blue bands   Reps 10-15   Time 10 Minutes     Interval Training   Interval Training No     Oxygen    Oxygen Continuous   Liters 8     Treadmill   MPH 1.8   Grade 0   Minutes 17     Bike   Level 1.2   Minutes 17      Nutrition:  Target Goals: Understanding of nutrition guidelines, daily intake of sodium <1519m, cholesterol <2015m calories 30% from fat and 7% or less from saturated fats, daily to have 5 or more servings of fruits and vegetables.  Biometrics:     Pre Biometrics - 11/15/16 1113      Pre Biometrics   Grip Strength 15 kg       Nutrition Therapy Plan and Nutrition Goals:   Nutrition Discharge: Rate Your Plate Scores:   Nutrition Goals Re-Evaluation:   Nutrition Goals Discharge (Final Nutrition Goals Re-Evaluation):   Psychosocial: Target Goals: Acknowledge presence or absence of significant depression and/or stress, maximize coping skills, provide positive support system. Participant is able to verbalize types and ability to use techniques and skills needed for reducing stress and depression.  Initial Review & Psychosocial Screening:     Initial Psych Review & Screening - 11/15/16 1136      Initial Review   Current issues with None Identified     Family Dynamics   Good Support System? Yes     Barriers   Psychosocial barriers to participate in program There are no identifiable barriers or psychosocial needs.     Screening Interventions   Interventions Encouraged to exercise      Quality of Life Scores:   PHQ-9: Recent Review Flowsheet Data    [facility]   Decreased Interest 0   Down, Depressed, Hopeless 0   PHQ - 2 Score 0     Interpretation of Total Score  Total Score Depression Severity:  1-4 = Minimal depression, 5-9 = Mild depression, 10-14 = Moderate depression, 15-19 = Moderately severe depression, 20-27 = Severe depression   Psychosocial Evaluation and Intervention:     Psychosocial Evaluation - 11/15/16 1144      Psychosocial Evaluation & Interventions   Interventions Encouraged to exercise  with the program and follow exercise prescription  Continue Psychosocial Services  No Follow up required      Psychosocial Re-Evaluation:     Psychosocial Re-Evaluation    [place] Name 12/31/16 0830 01/30/17 1304           Psychosocial Re-Evaluation   Current issues with Current Stress Concerns  -      Comments patient extremely concerned over his most recent diagnosis of hypertension patient more acceptance of hypertension diagnosis now that he sees his medication is working and his BP is controlled      Expected Outcomes through education and seeing improvement of his hypertension with medications patient will express resolved concern for hypertension through education and seeing improvement of his hypertension with medications patient will express resolved concern for hypertension      Interventions Encouraged to attend Pulmonary Rehabilitation for the exercise Encouraged to attend Pulmonary Rehabilitation for the exercise      Continue Psychosocial Services  Follow up required by staff Follow up required by staff        Initial Review   Source of Stress Concerns Chronic Illness;Unable to participate in former interests or hobbies;Unable to perform yard/household activities Chronic Illness;Unable to participate in former interests or hobbies;Unable to perform yard/household activities         Psychosocial Discharge (Final Psychosocial Re-Evaluation):     Psychosocial Re-Evaluation - 01/30/17 1304      Psychosocial Re-Evaluation   Comments patient more acceptance of hypertension diagnosis now that he sees his medication is working and his BP is controlled   Expected Outcomes through education and seeing improvement of his hypertension with medications patient will express resolved concern for hypertension   Interventions Encouraged to attend Pulmonary Rehabilitation for the exercise   Continue Psychosocial Services  Follow up required by staff     Initial Review   Source of  Stress Concerns Chronic Illness;Unable to participate in former interests or hobbies;Unable to perform yard/household activities      Education: Education Goals: Education classes will be provided on a weekly basis, covering required topics. Participant will state understanding/return demonstration of topics presented.  Learning Barriers/Preferences:     Learning Barriers/Preferences - 11/15/16 1134      Learning Barriers/Preferences   Learning Barriers None   Learning Preferences Computer/Internet;Group Instruction;Individual Instruction;Verbal Instruction;Written Material      Education Topics: Risk Factor Reduction:  -Group instruction that is supported by a PowerPoint presentation. Instructor discusses the definition of a risk factor, different risk factors for pulmonary disease, and how the heart and lungs work together.     Nutrition for Pulmonary Patient:  -Group instruction provided by PowerPoint slides, verbal discussion, and written materials to support subject matter. The instructor gives an explanation and review of healthy diet recommendations, which includes a discussion on weight management, recommendations for fruit and vegetable consumption, as well as protein, fluid, caffeine, fiber, sodium, sugar, and alcohol. Tips for eating when patients are short of breath are discussed.   PULMONARY REHAB OTHER RESPIRATORY from 02/27/2017 in [place]  Date  02/13/17  Educator  RD  Instruction Review Code  2- meets goals/outcomes      Pursed Lip Breathing:  -Group instruction that is supported by demonstration and informational handouts. Instructor discusses the benefits of pursed lip and diaphragmatic breathing and detailed demonstration on how to preform both.     Oxygen Safety:  -Group instruction provided by PowerPoint, verbal discussion, and written material to support subject matter. There is an overview of "What is Oxygen"  and "Why do we  need it".  Instructor also reviews how to create a safe environment for oxygen use, the importance of using oxygen as prescribed, and the risks of noncompliance. There is a brief discussion on traveling with oxygen and resources the patient may utilize.   PULMONARY REHAB OTHER RESPIRATORY from 02/27/2017 in [place]  Date  02/06/17  Educator  Halea Lieb  Instruction Review Code  2- meets goals/outcomes      Oxygen Equipment:  -Group instruction provided by [employer] Staff utilizing handouts, written materials, and equipment demonstrations.   PULMONARY REHAB OTHER RESPIRATORY from 02/27/2017 in [place]  Date  01/16/17  Educator  [facility]  Instruction Review Code  2- meets goals/outcomes      Signs and Symptoms:  -Group instruction provided by written material and verbal discussion to support subject matter. Warning signs and symptoms of infection, stroke, and heart attack are reviewed and when to call the physician/911 reinforced. Tips for preventing the spread of infection discussed.   PULMONARY REHAB OTHER RESPIRATORY from 02/27/2017 in [place]  Date  12/26/16  Educator  rn  Instruction Review Code  2- meets goals/outcomes      Advanced Directives:  -Group instruction provided by verbal instruction and written material to support subject matter. Instructor reviews Advanced Directive laws and proper instruction for filling out document.   Pulmonary Video:  -Group video education that reviews the importance of medication and oxygen compliance, exercise, good nutrition, pulmonary hygiene, and pursed lip and diaphragmatic breathing for the pulmonary patient.   PULMONARY REHAB OTHER RESPIRATORY from 02/27/2017 in [place]  Date  12/05/16  Instruction Review Code  2- meets goals/outcomes      Exercise for the Pulmonary Patient:  -Group instruction  that is supported by a PowerPoint presentation. Instructor discusses benefits of exercise, core components of exercise, frequency, duration, and intensity of an exercise routine, importance of utilizing pulse oximetry during exercise, safety while exercising, and options of places to exercise outside of rehab.     PULMONARY REHAB OTHER RESPIRATORY from 02/27/2017 in [place]  Date  02/20/17  Educator  ep  Instruction Review Code  R- Review/reinforce      Pulmonary Medications:  -Verbally interactive group education provided by instructor with focus on inhaled medications and proper administration.   PULMONARY REHAB OTHER RESPIRATORY from 02/27/2017 in [place]  Date  01/09/17  Educator  Pharm  Instruction Review Code  2- meets goals/outcomes      Anatomy and Physiology of the Respiratory System and Intimacy:  -Group instruction provided by PowerPoint, verbal discussion, and written material to support subject matter. Instructor reviews respiratory cycle and anatomical components of the respiratory system and their functions. Instructor also reviews differences in obstructive and restrictive respiratory diseases with examples of each. Intimacy, Sex, and Sexuality differences are reviewed with a discussion on how relationships can change when diagnosed with pulmonary disease. Common sexual concerns are reviewed.   MD DAY -A group question and answer session with a medical doctor that allows participants to ask questions that relate to their pulmonary disease state.   PULMONARY REHAB OTHER RESPIRATORY from 02/27/2017 in [place]  Date  02/27/17  Educator  yacoub  Instruction Review Code  2- meets goals/outcomes      OTHER EDUCATION -Group or individual verbal, written, or video  instructions that support the educational goals of the pulmonary rehab program.   Knowledge Questionnaire Score:      Knowledge Questionnaire Score - 12/05/16 1557      Knowledge Questionnaire Score   Pre Score 10/13      Core Components/Risk Factors/Patient Goals at Admission:     Personal Goals and Risk Factors at Admission - 12/31/16 0830      Core Components/Risk Factors/Patient Goals on Admission   Hypertension Yes   Intervention Provide education on lifestyle modifcations including regular physical activity/exercise, weight management, moderate sodium restriction and increased consumption of fresh fruit, vegetables, and low fat dairy, alcohol moderation, and smoking cessation.;Monitor prescription use compliance.   Expected Outcomes Short Term: Continued assessment and intervention until BP is < 140/10m HG in hypertensive participants. < 130/854mHG in hypertensive participants with diabetes, heart failure or chronic kidney disease.;Long Term: Maintenance of blood pressure at goal levels.   Stress Yes   Intervention Offer individual and/or small group education and counseling on adjustment to heart disease, stress management and health-related lifestyle change. Teach and support self-help strategies.;Refer participants experiencing significant psychosocial distress to appropriate mental health specialists for further evaluation and treatment. When possible, include family members and significant others in education/counseling sessions.   Expected Outcomes Short Term: Participant demonstrates changes in health-related behavior, relaxation and other stress management skills, ability to obtain effective social support, and compliance with psychotropic medications if prescribed.;Long Term: Emotional wellbeing is indicated by absence of clinically significant psychosocial distress or social isolation.      Core Components/Risk Factors/Patient Goals Review:      Goals and Risk Factor Review    Row Name 12/31/16 0829 01/30/17 1304 02/25/17 1551         Core Components/Risk Factors/Patient Goals  Review   Personal Goals Review Weight Management/Obesity;Improve shortness of breath with ADL's;Develop more efficient breathing techniques such as purse lipped breathing and diaphragmatic breathing and practicing self-pacing with activity.;Hypertension;Stress Weight Management/Obesity;Improve shortness of breath with ADL's;Develop more efficient breathing techniques such as purse lipped breathing and diaphragmatic breathing and practicing self-pacing with activity.;Hypertension;Stress Weight Management/Obesity;Improve shortness of breath with ADL's;Develop more efficient breathing techniques such as purse lipped breathing and diaphragmatic breathing and practicing self-pacing with activity.;Hypertension;Stress     Review see comment section on ITP see comment section on ITP his weight remains at baseline. his wife states the patient is more aware of the "unhealthy" things he eats. he utilizes PLB and pacing however he sometimes feels he cant get enough air through his nose. I have worked with patient on taking in larger breaths through his mouth and blowing out through pursed lips. This technique makes him feel more satisfied with air consumption and decreases the anxiety he has when he feels he cant get enough air through his nose. he has learned to pace himself so that he can tolerate workload increases. his hypertension has resolved with medication. 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 his wife states the patient is more aware of the "unhealthy" things he eats. he utilizes PLB and pacing however he sometimes feels he cant get enough air through his nose. I have worked with patient on taking in larger breaths through his mouth and blowing out through pursed lips. This technique makes him feel more satisfied with air consumption and decreases the anxiety he has when he feels he cant get enough air through his nose. he has learned to pace himself so that he can tolerate workload increases. his hypertension has resolved with medication. 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 Recommend continued exercise, life style modification, education, and utilization of breathing techniques to increase stamina and strength and decrease shortness of breath with exertion.

## 2017-02-27 NOTE — Progress Notes (Signed)
Daily Session Note  Patient Details  Name: Jeffrey Cobb MRN: [id number] Date of Birth: February 21, 1941 Referring Provider:     Pulmonary Rehab Walk Test from 11/19/2016 in [place]  Referring Provider  Dr. [name]      Encounter Date: 02/27/2017  Check In:     Session Check In - 02/27/17 1021      Check-In   Location [facility]   Staff Present Trish Fountain, RN, Maxcine Ham, RN, BSN;Katria Botts, MS, ACSM RCEP, Exercise Physiologist;Lisa Ysidro Evert, RN   Supervising physician immediately available to respond to emergencies [facility] immediately available   Physician(s) Dr. Wendee Beavers   Medication changes reported     No   Fall or balance concerns reported    No   Tobacco Cessation No Change   Warm-up and Cool-down Performed as group-led instruction   Resistance Training Performed Yes   VAD Patient? No     Pain Assessment   Currently in Pain? No/denies   Multiple Pain Sites No      Capillary Blood Glucose: No results found for this or any previous visit (from the past 24 hour(s)).      Exercise Prescription Changes - 02/27/17 1200      Response to Exercise   Blood Pressure (Admit) 122/70   Blood Pressure (Exercise) 150/80   Blood Pressure (Exit) 114/70   Heart Rate (Admit) 92 bpm   Heart Rate (Exercise) 107 bpm   Heart Rate (Exit) 92 bpm   Oxygen Saturation (Admit) 98 %   Oxygen Saturation (Exercise) 89 %   Oxygen Saturation (Exit) 96 %   Rating of Perceived Exertion (Exercise) 11   Perceived Dyspnea (Exercise) 0   Duration Continue with 45 min of aerobic exercise without signs/symptoms of physical distress.   Intensity THRR unchanged     Progression   Progression Continue to progress workloads to maintain intensity without signs/symptoms of physical distress.     Resistance Training   Training Prescription Yes   Weight blue bands   Reps 10-15   Time 10 Minutes     Interval Training   Interval  Training No     Oxygen   Oxygen Continuous   Liters 8     Treadmill   MPH 1.8   Grade 0   Minutes 17     Bike   Level 1.2   Minutes 17      History  Smoking Status  . Former Smoker  . Packs/day: 3.00  . Years: 17.00  . Types: Cigarettes  . Quit date: 08/26/1977  Smokeless Tobacco  . Never Used    Goals Met:  Exercise tolerated well No report of cardiac concerns or symptoms Strength training completed today  Goals Unmet:  Not Applicable  Comments: Service time is from 10:30a to 12:10p    Dr. Rush Farmer is Medical Director for Pulmonary Rehab at [facility].

## 2017-03-04 ENCOUNTER — Encounter (HOSPITAL_COMMUNITY)
Admission: RE | Admit: 2017-03-04 | Discharge: 2017-03-04 | Disposition: A | Discharge status: Home / Self Care | Payer: Medicare Other | Source: Ambulatory Visit | Attending: Pulmonary Disease | Admitting: Pulmonary Disease

## 2017-03-04 VITALS — Ht 66.0 in | Wt 210.5 lb

## 2017-03-04 DIAGNOSIS — J849 Interstitial pulmonary disease, unspecified: Secondary | ICD-10-CM | POA: Diagnosis not present

## 2017-03-06 ENCOUNTER — Encounter (HOSPITAL_COMMUNITY): Payer: Medicare Other

## 2017-03-11 ENCOUNTER — Encounter (HOSPITAL_COMMUNITY): Payer: Medicare Other

## 2017-03-13 ENCOUNTER — Encounter (HOSPITAL_COMMUNITY): Payer: Medicare Other

## 2017-03-18 ENCOUNTER — Encounter (HOSPITAL_COMMUNITY): Payer: Medicare Other

## 2017-03-20 ENCOUNTER — Encounter (HOSPITAL_COMMUNITY): Payer: Medicare Other

## 2017-03-25 ENCOUNTER — Encounter (HOSPITAL_COMMUNITY): Payer: Medicare Other

## 2017-03-25 ENCOUNTER — Ambulatory Visit (INDEPENDENT_AMBULATORY_CARE_PROVIDER_SITE_OTHER): Payer: Medicare Other | Admitting: Pulmonary Disease

## 2017-03-25 ENCOUNTER — Encounter: Payer: Self-pay | Admitting: Pulmonary Disease

## 2017-03-25 VITALS — BP 136/74 | HR 95 | Ht 67.0 in | Wt 211.0 lb

## 2017-03-25 DIAGNOSIS — J9611 Chronic respiratory failure with hypoxia: Secondary | ICD-10-CM | POA: Diagnosis not present

## 2017-03-25 DIAGNOSIS — Z9989 Dependence on other enabling machines and devices: Secondary | ICD-10-CM | POA: Diagnosis not present

## 2017-03-25 DIAGNOSIS — J84112 Idiopathic pulmonary fibrosis: Secondary | ICD-10-CM | POA: Diagnosis not present

## 2017-03-25 DIAGNOSIS — G4733 Obstructive sleep apnea (adult) (pediatric): Secondary | ICD-10-CM

## 2017-03-25 NOTE — Patient Instructions (Signed)
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 Continue using 5 L pulse oxygen when you exert yourself for now  For your chest congestion and mucus production: Use Mucinex and albuterol prior to bedtime to help clear the mucus out  We will see you back in 2 months or sooner if needed

## 2017-03-25 NOTE — Progress Notes (Signed)
Subjective:    Patient ID: Jeffrey Cobb, male    DOB: 1941/07/05, 76 y.o.   MRN: [id number]  Synopsis: Former patient of Dr. 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 Follow-up    pt c/o worsening sob with exertion, prod cough with white/yellow mucus.     Erinn feels like pulmonary rehab helped, he got stronger and enjoyed it.  They did not go to [place] because of his oxygen needs.  He feels like his dyspnea has progressed.  He is planning to join a gym to stay active.  He and his wife live in a townhouse and they feel like he really struggles to climb up the hill outside the house.  He avoids the heat when the weather is more than 85 degrees.    He still has a cough productive of yellow sputum.  He is not sure if it is really worse or not overall.  He is still using his CPAP machine nightly.  However when the cough is bad he can't use the machine.  He has been taking mucinex to help thin it.    He continues to take the Ofev, he had some nausea when he first started it, no problems since then.  He has not had any diarrhea.    He continues to use and benefit from his oxygen.  He uses 5L per minute when exerting himself.  He uses 5L continuously.  On this amount of oxygen he will see his oxygen saturation drop to 79% with exertion.  With rest it will come up to the 95% range.  Past Medical History:  Diagnosis Date  . Acute sinusitis, unspecified   . Arthritis   . 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 GERD (gastroesophageal reflux disease)   . Left anterior fascicular block    hx of on ekg  . Mixed hyperlipidemia   . Obstructive chronic bronchitis with exacerbation (HCC)   . Occlusion and stenosis of carotid artery without mention of  cerebral infarction    a. 05/2013 Carotid U/S: 1-39% bilat stenosis.  . Other emphysema (HCC)   . 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 Sleep apnea    does not use cpap due to cough  . Stroke [facility]) 05/2015      Review of Systems  Constitutional: Negative for chills, fatigue and fever.  HENT: Positive for rhinorrhea. Negative for sinus pressure and sneezing.   Respiratory: Positive for cough. Negative for shortness of breath and wheezing.   Cardiovascular: Negative for chest pain, palpitations and leg swelling.       Objective:   Physical Exam Vitals:   03/25/17 1415  BP: 136/74  Pulse: 95  SpO2: 97%  Weight: 211 lb (95.7 kg)  Height: 5\' 7"  (1.702 m)   5L pm pulse  Gen: chronically ill appearing HENT: OP clear, TM's clear, neck supple PULM: Crackles bases B, normal percussion CV: RRR, no mgr, trace edema GI: BS+, soft, nontender Derm: no cyanosis or rash Psyche: normal mood and affect      CBC    Component Value Date/Time   WBC 6.7 05/31/2015 1730   RBC 4.64 05/31/2015 1730   HGB 14.6 05/31/2015  1730   HCT 41.5 05/31/2015 1730   PLT 138 (L) 05/31/2015 1730   MCV 89.4 05/31/2015 1730   MCH 31.5 05/31/2015 1730   MCHC 35.2 05/31/2015 1730   RDW 13.4 05/31/2015 1730   LYMPHSABS 2.2 09/02/2014 1204   MONOABS 0.7 09/02/2014 1204   EOSABS 0.2 09/02/2014 1204   BASOSABS 0.0 09/02/2014 1204     PFT PFT"s 2009:  FEV1 3.49 (115%), ratio 65, DLCO 67%. PFT's 08/2014:  FEV1 3.13 (114%), ratio 69, decreased airtrapping from 2009, DLCO 49% June 2017 pulmonary function testing FEV1 2.99 L (111% predicted, FVC 3.67 L (98% predicted), total lung capacity 5.53 L (85% predicted), DLCO 12.76 (45% predicted). March 2018 pulmonary function testing ratio 80%, FVC 2.94 L 79% predicted, total lung capacity 4.51 L 69% predicted, DLCO 9.05 31% predicted  Imaging: 01/2016 HRCT > traction bronchiolectasis and interlobular septal thickening in a  basilar predominance have progressed compared to July 2016, there remains no frank honeycombing. There are multiple pulmonary nodules which are stable or have resolved. February 2018 high-resolution CT chest independently reviewed showing traction bronchiectasis, interlobular septal thickening and honeycombing worse in the periphery and bases.  6 minute walk test: August 2016 6 minute walk test 336 m O2 sats ration 92% on room air 04/27/2016 6 minute walk distance 292.5 m O2 sats ration and completion of test was 82% on room air. July 2018 6 minute walk: Distance 1231 feet, O2 saturation 88% on 10 L nasal cannula  Other testing: September 2017 pH probe showed normal pH and no clear evidence of gastroesophageal reflux.  CPAP compliance report 02/2017> only using 39% of nights      Assessment & Plan:  IPF (idiopathic pulmonary fibrosis) (HCC) - Plan: Pulmonary function test  Chronic respiratory failure with hypoxia (HCC) - Plan: Ambulatory Referral for DME  OSA on CPAP  Discussion: I'm concerned about Mr. Kathi LudwigScarborough. His dyspnea and oxygen needs are worsening. We're having a hard time keeping up with his oxygen demands with portable and standard home equipment. I believe that he needs continuous flow rather than pulse flow. We are going to have a company go out to his house and check his oxygen level when he is walking around on his pulse flow home concentrator to see if this prescription needs to be changed. I suspect we are going to need to change into continuous flow with an Oxymizer. His compliance with CPAP is not very good mostly because of mucus production which is likely due to progressive idiopathic pulmonary fibrosis. I've asked him to use albuterol and Mucinex prior to bedtime to clear the mucus out of his lungs to help him in prove compliance with CPAP.  Plan: For your obstructive sleep apnea: Continue using CPAP nightly  For your idiopathic pulmonary fibrosis: Continue  taking Ofev We will get a lung function test in 6 minute walk on the next visit Exercise regularly, join a gym or go back to pulmonary rehabilitation  For your chronic respiratory failure with hypoxemia: We are going to have a company assess your oxygen saturation in the home, I believe that you need continuous flow oxygen and not pulse while at rest. Continue using 5 L pulse oxygen when you exert yourself for now  For your chest congestion and mucus production: Use Mucinex and albuterol prior to bedtime to help clear the mucus out  We will see you back in 2 months or sooner if needed   Current Outpatient Prescriptions:  .  albuterol (PROVENTIL) (2.5  MG/3ML) 0.083% nebulizer solution, Take 3 mLs (2.5 mg total) by nebulization every 6 (six) hours as needed for wheezing or shortness of breath., Disp: 360 mL, Rfl: 11 .  aspirin 81 MG tablet, Take 81 mg by mouth daily., Disp: , Rfl:  .  atorvastatin (LIPITOR) 40 MG tablet, Take 1 tablet (40 mg total) by mouth daily at 6 PM., Disp: 30 tablet, Rfl: 0 .  cyanocobalamin 500 MCG tablet, Take 1,000 mcg by mouth daily., Disp: , Rfl:  .  Nintedanib (OFEV) 150 MG CAPS, Take 150 mg by mouth 2 (two) times daily., Disp: 60 capsule, Rfl:  .  oxymetazoline (AFRIN) 0.05 % nasal spray, Place 1 spray into both nostrils as needed for congestion., Disp: , Rfl:  .  pramipexole (MIRAPEX) 0.5 MG tablet, TAKE 1 TABLET BY MOUTH EVERY MORNING AND TAKE 1 TABLET EVERY EVENING, Disp: 180 tablet, Rfl: 3 .  PROAIR HFA 108 (90 BASE) MCG/ACT inhaler, Inhale 2 puffs into the lungs every 6 (six) hours as needed., Disp: , Rfl:  .  QUEtiapine Fumarate (SEROQUEL PO), Take 50 mg by mouth at bedtime., Disp: , Rfl:  .  triamcinolone cream (KENALOG) 0.1 %, APPLY 1 APPLICATION ON THE SKIN TWICE A DAY AS NEEDED, Disp: , Rfl: 2

## 2017-04-04 ENCOUNTER — Telehealth: Payer: Self-pay | Admitting: Pulmonary Disease

## 2017-04-04 NOTE — Telephone Encounter (Signed)
Spoke with the pt  He states returning call to research nurse  I gave him the number to the research dept  Nothing further needed

## 2017-04-04 NOTE — Telephone Encounter (Signed)
[email or web address] x1 

## 2017-04-04 NOTE — Telephone Encounter (Signed)
Pt returning call.  He is traveling and keeps losing signal.

## 2017-04-08 ENCOUNTER — Encounter (HOSPITAL_COMMUNITY): Payer: Self-pay

## 2017-04-08 NOTE — Progress Notes (Signed)
Discharge Summary  Patient Details  Name: Jeffrey Cobb MRN: [id number] Date of Birth: [birth year] Referring Provider:     Pulmonary Rehab Walk Test from 11/19/2016 in [facility]  Referring Provider  Dr. Kendrick Fries       Number of Visits: 24  Reason for Discharge:  Patient reached a stable level of exercise. Patient independent in their exercise.  Smoking History:  History  Smoking Status  . Former Smoker  . Packs/day: 3.00  . Years: 17.00  . Types: Cigarettes  . Quit date: 08/26/1977  Smokeless Tobacco  . Never Used    Diagnosis:  No diagnosis found.  ADL UCSD:     Pulmonary Assessment Scores    Row Name 12/05/16 1558 01/07/17 1525 02/27/17 1537     ADL UCSD   ADL Phase Entry  - Exit   SOB Score total 100  - 35     CAT Score   CAT Score  - 26  Pre 15  Exit      Initial Exercise Prescription:     Initial Exercise Prescription - 11/19/16 1600      Date of Initial Exercise RX and Referring Provider   Date 11/19/16   Referring Provider Dr. Kendrick Fries     Oxygen   Oxygen Continuous   Liters 6     Bike   Level 0.5   Minutes 17     NuStep   Level 2   Minutes 17   METs 1.5     Track   Laps 10   Minutes 17     Prescription Details   Frequency (times per week) 2   Duration Progress to 45 minutes of aerobic exercise without signs/symptoms of physical distress     Intensity   THRR 40-80% of Max Heartrate 58-115   Ratings of Perceived Exertion 11-13   Perceived Dyspnea 0-4     Progression   Progression Continue progressive overload as per policy without signs/symptoms or physical distress.     Resistance Training   Training Prescription Yes   Weight blue bands   Reps 10-15      Discharge Exercise Prescription (Final Exercise Prescription Changes):     Exercise Prescription Changes - 02/27/17 1200      Response to Exercise   Blood Pressure (Admit) 122/70   Blood Pressure (Exercise) 150/80   Blood  Pressure (Exit) 114/70   Heart Rate (Admit) 92 bpm   Heart Rate (Exercise) 107 bpm   Heart Rate (Exit) 92 bpm   Oxygen Saturation (Admit) 98 %   Oxygen Saturation (Exercise) 89 %   Oxygen Saturation (Exit) 96 %   Rating of Perceived Exertion (Exercise) 11   Perceived Dyspnea (Exercise) 0   Duration Continue with 45 min of aerobic exercise without signs/symptoms of physical distress.   Intensity THRR unchanged     Progression   Progression Continue to progress workloads to maintain intensity without signs/symptoms of physical distress.     Resistance Training   Training Prescription Yes   Weight blue bands   Reps 10-15   Time 10 Minutes     Interval Training   Interval Training No     Oxygen   Oxygen Continuous   Liters 8     Treadmill   MPH 1.8   Grade 0   Minutes 17     Bike   Level 1.2   Minutes 17      Functional Capacity:     6 Minute  Walk    Row Name 11/19/16 1612 03/04/17 1228       6 Minute Walk   Phase Initial Discharge    Distance 1230 feet 1231 feet    Distance % Change  - 0.08 %    Walk Time 6 minutes 6 minutes    # of Rest Breaks 0 0    MPH 2.32 2.33    METS 2.76 2.76    RPE 12 11    Perceived Dyspnea  0 1    Symptoms No No    Resting HR 88 bpm 98 bpm    Resting BP 138/90 122/70    Max Ex. HR 110 bpm 160 bpm    Max Ex. BP 150/70 138/78    2 Minute Post BP  - 112/76      Interval HR   Baseline HR 88 98    1 Minute HR 94 108    2 Minute HR 102 112    3 Minute HR 96 117    4 Minute HR 101 123    5 Minute HR 100 153    6 Minute HR 110 160    2 Minute Post HR 100 108    Interval Heart Rate? Yes Yes      Interval Oxygen   Interval Oxygen? Yes Yes    Baseline Oxygen Saturation % 94 % 98 %    Baseline Liters of Oxygen 4 L 8 L    1 Minute Oxygen Saturation % 94 % 92 %    1 Minute Liters of Oxygen 4 L 8 L    2 Minute Oxygen Saturation % 89 % 87 %    2 Minute Liters of Oxygen 4 L 8 L    3 Minute Oxygen Saturation % 86 % 86 %    3  Minute Liters of Oxygen 4 L 8 L    4 Minute Oxygen Saturation % 89 % 86 %    4 Minute Liters of Oxygen 6 L 10 L    5 Minute Oxygen Saturation % 89 % 88 %    5 Minute Liters of Oxygen 6 L 10 L    6 Minute Oxygen Saturation % 88 % 88 %    6 Minute Liters of Oxygen 6 L 10 L    2 Minute Post Oxygen Saturation % 93 % 93 %    2 Minute Post Liters of Oxygen 6 L 10 L       Psychological, QOL, Others - Outcomes: PHQ 2/9: Depression screen [facility] 2/[facility] 03/04/2017 11/15/2016  Decreased Interest 0 0  Down, Depressed, Hopeless 0 0  PHQ - 2 Score 0 0    Quality of Life:   Personal Goals: Goals established at orientation with interventions provided to work toward goal.     Personal Goals and Risk Factors at Admission - 12/31/16 0830      Core Components/Risk Factors/Patient Goals on Admission   Hypertension Yes   Intervention Provide education on lifestyle modifcations including regular physical activity/exercise, weight management, moderate sodium restriction and increased consumption of fresh fruit, vegetables, and low fat dairy, alcohol moderation, and smoking cessation.;Monitor prescription use compliance.   Expected Outcomes Short Term: Continued assessment and intervention until BP is < 140/3890mm HG in hypertensive participants. < 130/6680mm HG in hypertensive participants with diabetes, heart failure or chronic kidney disease.;Long Term: Maintenance of blood pressure at goal levels.   Stress Yes   Intervention Offer individual and/or small group education and counseling on  adjustment to heart disease, stress management and health-related lifestyle change. Teach and support self-help strategies.;Refer participants experiencing significant psychosocial distress to appropriate mental health specialists for further evaluation and treatment. When possible, include family members and significant others in education/counseling sessions.   Expected Outcomes Short Term: Participant demonstrates changes in  health-related behavior, relaxation and other stress management skills, ability to obtain effective social support, and compliance with psychotropic medications if prescribed.;Long Term: Emotional wellbeing is indicated by absence of clinically significant psychosocial distress or social isolation.       Personal Goals Discharge:     Goals and Risk Factor Review    Row Name 12/31/16 0829 01/30/17 1304 02/25/17 1551         Core Components/Risk Factors/Patient Goals Review   Personal Goals Review Weight Management/Obesity;Improve shortness of breath with ADL's;Develop more efficient breathing techniques such as purse lipped breathing and diaphragmatic breathing and practicing self-pacing with activity.;Hypertension;Stress Weight Management/Obesity;Improve shortness of breath with ADL's;Develop more efficient breathing techniques such as purse lipped breathing and diaphragmatic breathing and practicing self-pacing with activity.;Hypertension;Stress Weight Management/Obesity;Improve shortness of breath with ADL's;Develop more efficient breathing techniques such as purse lipped breathing and diaphragmatic breathing and practicing self-pacing with activity.;Hypertension;Stress     Review see comment section on ITP see comment section on ITP his weight remains at baseline. his wife states the patient is more aware of the "unhealthy" things he eats. he utilizes PLB and pacing however he sometimes feels he cant get enough air through his nose. I have worked with patient on taking in larger breaths through his mouth and blowing out through pursed lips. This technique makes him feel more satisfied with air consumption and decreases the anxiety he has when he feels he cant get enough air through his nose. he has learned to pace himself so that he can tolerate workload increases. his hypertension has resolved with medication. he is more in the acceptance phase of this new diagnosis of IPF     Expected Outcomes  see admission expected outcomes see admission expected outcomes see admission expected outcomes        Nutrition & Weight - Outcomes:     Pre Biometrics - 11/15/16 1113      Pre Biometrics   Grip Strength 15 kg         Post Biometrics - 03/04/17 1232       Post  Biometrics   Height 5\' 6"  (1.676 m)   Weight 210 lb 8.6 oz (95.5 kg)   BMI (Calculated) 34.1   Grip Strength 15 kg   Flexibility 35.5 in      Nutrition:     Nutrition Therapy & Goals - 03/13/17 0717      Nutrition Therapy   Diet Therapeutic Lifestyle Changes     Personal Nutrition Goals   Nutrition Goal Wt loss of 1-2 lb/week to a wt loss goal of 6-24 lb at graduation from Pulmonary Rehab   Personal Goal #2 Identify and limit food sources of sodium     Intervention Plan   Intervention Prescribe, educate and counsel regarding individualized specific dietary modifications aiming towards targeted core components such as weight, hypertension, lipid management, diabetes, heart failure and other comorbidities.;Nutrition handout(s) given to patient.  1500 kcal, 5 day menu ideas   Expected Outcomes Short Term Goal: Understand basic principles of dietary content, such as calories, fat, sodium, cholesterol and nutrients.;Long Term Goal: Adherence to prescribed nutrition plan.      Nutrition Discharge:     Nutrition  Assessments - 03/13/17 0716      Rate Your Plate Scores   Post Score 59      Education Questionnaire Score:     Knowledge Questionnaire Score - 02/27/17 1537      Knowledge Questionnaire Score   Post Score 11/13      Goals reviewed with patient; copy given to patient.

## 2017-04-11 ENCOUNTER — Telehealth: Payer: Self-pay | Admitting: Pulmonary Disease

## 2017-04-11 NOTE — Telephone Encounter (Signed)
Spoke with patient. He stated that he was returning a call from the 10th. Looked in patient's chart and it looks like he was trying to reach someone from the research department. Advised patient I would reach out to the research department so what was needed.   Victorino Dike, do you have any information for this patient? Please advise. Thanks!

## 2017-04-14 ENCOUNTER — Telehealth: Payer: Self-pay | Admitting: Pulmonary Disease

## 2017-04-14 DIAGNOSIS — J84112 Idiopathic pulmonary fibrosis: Secondary | ICD-10-CM

## 2017-04-14 NOTE — Telephone Encounter (Signed)
BQ, please advise if it ok for [place] to place an order for patient to start back up for pulmonary rehab. Thanks!

## 2017-04-16 NOTE — Telephone Encounter (Signed)
Called and spoke with Jeffrey Cobb and she stated that the pt has spoken with research.  Nothing further is needed.

## 2017-04-21 NOTE — Telephone Encounter (Signed)
yes

## 2017-04-21 NOTE — Telephone Encounter (Signed)
Order placed to restart pulm rehab.  Nothing further needed.

## 2017-04-22 ENCOUNTER — Other Ambulatory Visit (HOSPITAL_COMMUNITY): Payer: Self-pay

## 2017-04-22 DIAGNOSIS — J84112 Idiopathic pulmonary fibrosis: Secondary | ICD-10-CM

## 2017-04-25 ENCOUNTER — Encounter (HOSPITAL_COMMUNITY): Payer: Self-pay

## 2017-04-25 ENCOUNTER — Encounter (HOSPITAL_COMMUNITY)
Admission: RE | Admit: 2017-04-25 | Discharge: 2017-04-25 | Disposition: A | Discharge status: Home / Self Care | Payer: Medicare Other | Source: Ambulatory Visit | Attending: Pulmonary Disease | Admitting: Pulmonary Disease

## 2017-04-25 VITALS — BP 170/94 | HR 85 | Resp 18 | Ht 65.5 in | Wt 211.4 lb

## 2017-04-25 DIAGNOSIS — J849 Interstitial pulmonary disease, unspecified: Secondary | ICD-10-CM | POA: Insufficient documentation

## 2017-04-25 NOTE — Progress Notes (Signed)
Jeffrey Cobb 76 y.o. male Pulmonary Rehab Orientation Note Patient arrived today in Cardiac and Pulmonary Rehab for orientation to Pulmonary Rehab. [facility] [place] [facility] via [facility] chair, accompanied by his wife. He does carry portable oxygen. He has developed a "habit of wearing it continuously when he actually only needs it during exertion. This was reinforced with the patient. Hopefully it will help with his dry nose and eyes symptoms. Patient was noted to be significantly hypertensive during this encounter. Multiple BPs taken manuel and with automatic cuff. Spoke with Dr. Tenny Crawoss' nurse. Patient to leave pulmonary rehab appointment and go straight to walkin clinic. Patient denies symptoms of hypertension. 2+ pitting edema noted to ankles and lower extremities. Patient feels he is not consuming a high amount of sodium. He is taking an extended steroid dose pack for 14 days. Color good, skin warm and dry. Patient is oriented to time and place. Patient's medical history, psychosocial health, and medications reviewed. Psychosocial assessment reveals pt lives with their spouse. Pt is currently retired. Pt hobbies include painting. Pt reports his stress level is low. Areas of stress/anxiety include Health.  Pt does not exhibit signs of depression. PHQ2/9 score 0/na. Pt shows good  coping skills with positive outlook. He is offered emotional support and reassurance. Will continue to monitor and evaluate progress toward psychosocial goal(s) of maintaining his home exercise during this admission to pulmonary rehab. He now understands it is a lifestyle change. Physical assessment reveals heart rate is normal, breath sounds clear to auscultation, no wheezesor rhonchi. Fine crackles in the bases. Distal pulses palpable but faint. Patient reports he does take medications as prescribed. Patient states he follows a Regular diet. The patient reports no specific efforts to gain or lose weight.. Patient's weight  will be monitored closely. Demonstration and practice of PLB using pulse oximeter. Patient able to return demonstration satisfactorily. Safety and hand hygiene in the exercise area reviewed with patient. Patient voices understanding of the information reviewed. Department expectations discussed with patient and achievable goals were set. The patient shows enthusiasm about attending the program and we look forward to working with this nice gentleman. The patient is scheduled for a 6 min walk test on 04/29/17 and to begin exercise on 05/06/17 at 1030.   45 minutes was spent on a variety of activities such as assessment of the patient, obtaining baseline data including height, weight, BMI, and grip strength, verifying medical history, allergies, and current medications, and teaching patient strategies for performing tasks with less respiratory effort with emphasis on pursed lip breathing.

## 2017-04-29 ENCOUNTER — Encounter (HOSPITAL_COMMUNITY)
Admission: RE | Admit: 2017-04-29 | Discharge: 2017-04-29 | Disposition: A | Discharge status: Home / Self Care | Payer: Medicare Other | Source: Ambulatory Visit | Attending: Pulmonary Disease | Admitting: Pulmonary Disease

## 2017-04-29 DIAGNOSIS — J849 Interstitial pulmonary disease, unspecified: Secondary | ICD-10-CM | POA: Insufficient documentation

## 2017-04-29 NOTE — Progress Notes (Signed)
Pulmonary Individual Treatment Plan  Patient Details  Name: RENAUD CELLI MRN: [id number] Date of Birth: [birth year] Referring Provider:     Pulmonary Rehab Walk Test from 04/29/2017 in [facility]  Referring Provider  Dr. [name]      Initial Encounter Date:    Pulmonary Rehab Walk Test from 04/29/2017 in [facility]  Date  04/29/17  Referring Provider  Dr. [name]      Visit Diagnosis: ILD (interstitial lung disease) ([place])  Patient's Home Medications on Admission:   Current Outpatient Prescriptions:  .  albuterol (PROVENTIL) (2.5 MG/3ML) 0.083% nebulizer solution, Take 3 mLs (2.5 mg total) by nebulization every 6 (six) hours as needed for wheezing or shortness of breath., Disp: 360 mL, Rfl: 11 .  amLODipine (NORVASC) 2.5 MG tablet, Take 2.5 mg by mouth daily., Disp: , Rfl:  .  aspirin 81 MG tablet, Take 81 mg by mouth daily., Disp: , Rfl:  .  atorvastatin (LIPITOR) 40 MG tablet, Take 1 tablet (40 mg total) by mouth daily at 6 PM., Disp: 30 tablet, Rfl: 0 .  cyanocobalamin 500 MCG tablet, Take 1,000 mcg by mouth daily., Disp: , Rfl:  .  Nintedanib (OFEV) 150 MG CAPS, Take 150 mg by mouth 2 (two) times daily., Disp: 60 capsule, Rfl:  .  oxymetazoline (AFRIN) 0.05 % nasal spray, Place 1 spray into both nostrils as needed for congestion., Disp: , Rfl:  .  pramipexole (MIRAPEX) 0.5 MG tablet, TAKE 1 TABLET BY MOUTH EVERY MORNING AND TAKE 1 TABLET EVERY EVENING, Disp: 180 tablet, Rfl: 3 .  predniSONE (STERAPRED UNI-PAK 21 TAB) 5 MG (21) TBPK tablet, , Disp: , Rfl:  .  PROAIR HFA 108 (90 BASE) MCG/ACT inhaler, Inhale 2 puffs into the lungs every 6 (six) hours as needed., Disp: , Rfl:  .  QUEtiapine (SEROQUEL) 100 MG tablet, , Disp: , Rfl:  .  triamcinolone cream (KENALOG) 0.1 %, APPLY 1 APPLICATION ON THE SKIN TWICE A DAY AS NEEDED, Disp: , Rfl: 2  Past Medical History: Past Medical History:  Diagnosis Date  . Acute  sinusitis, unspecified   . Arthritis   . 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 GERD (gastroesophageal reflux disease)   . Left anterior fascicular block    hx of on ekg  . Mixed hyperlipidemia   . Obstructive chronic bronchitis with exacerbation ([place])   . Occlusion and stenosis of carotid artery without mention of cerebral infarction    a. 05/2013 Carotid U/S: 1-39% bilat stenosis.  . Other emphysema ([place])   . 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 Sleep apnea    does not use cpap due to cough  . Stroke ([place]) 05/2015    Tobacco Use: History  Smoking Status  . Former Smoker  . Packs/day: 3.00  . Years: 17.00  . Types: Cigarettes  . Quit date: 08/26/1977  Smokeless Tobacco  . Never Used    Labs: Recent Review Flowsheet Data    Labs for ITP Cardiac and Pulmonary Rehab Latest Ref Rng & Units 10/27/2007 11/01/2008 01/17/2011 06/01/2015   Cholestrol 0 - 200 mg/dL 175 169 165 170   LDLCALC 0 - 99 mg/dL 108(H) 105(H) 90 103(H)   HDL >40 mg/dL 57.8 52.5 67.40 58   Trlycerides <150 mg/dL 44 59 40.0 46   Hemoglobin A1c 4.8 - 5.6 % - - - 5.7(H)  Capillary Blood Glucose: No results found for: GLUCAP   Pulmonary Assessment Scores:     Pulmonary Assessment Scores    Row Name 12/05/16 1558 01/07/17 1525 02/27/17 1537     ADL UCSD   ADL Phase Entry  - Exit   SOB Score total 100  - 35     CAT Score   CAT Score  - 26  Pre 15  Exit      Pulmonary Function Assessment:     Pulmonary Function Assessment - 04/25/17 1543      Breath   Bilateral Breath Sounds --  clear upper fine crackles lower   Shortness of Breath Yes;Limiting activity      Exercise Target Goals: Date: 04/29/17  Exercise Program Goal: Individual exercise prescription set with THRR, safety & activity barriers. Participant demonstrates ability to understand and report RPE using BORG scale, to self-measure pulse accurately, and  to acknowledge the importance of the exercise prescription.  Exercise Prescription Goal: Starting with aerobic activity 30 plus minutes a day, 3 days per week for initial exercise prescription. Provide home exercise prescription and guidelines that participant acknowledges understanding prior to discharge.  Activity Barriers & Risk Stratification:     Activity Barriers & Cardiac Risk Stratification - 04/25/17 1512      Activity Barriers & Cardiac Risk Stratification   Activity Barriers Joint Problems;Arthritis;Back Problems;Deconditioning;Shortness of Breath;Left Knee Replacement;Right Knee Replacement;Neck/Spine Problems      6 Minute Walk:     6 Minute Walk    Row Name 11/19/16 1612 03/04/17 1228 04/29/17 1619     6 Minute Walk   Phase Initial Discharge Discharge   Distance 1230 feet 1231 feet 1300 feet   Distance % Change  - 0.08 %  -   Walk Time 6 minutes 6 minutes 6 minutes   # of Rest Breaks 0 0 0   MPH 2.32 2.33 2.46   METS 2.76 2.76 2.84   RPE _0 Perceived Dyspnea  0 1 1   Symptoms No No Yes (comment)   Comments  -  - USED WHEELCHAIR   Resting HR 88 bpm 98 bpm 101 bpm   Resting BP 138/90 122/70 150/92   Resting Oxygen Saturation   -  - 95 %   Exercise Oxygen Saturation  during 6 min walk  -  - 87 %   Max Ex. HR 110 bpm 160 bpm 118 bpm   Max Ex. BP 150/70 138/78 155/92   2 Minute Post BP  - 112/76  -     Interval HR   Baseline HR (retired) 88 98  -   1 Minute HR 94 108 101   2 Minute HR 102 112 104   3 Minute HR 96 117 118   4 Minute HR 101 123  -   5 Minute HR 100 153 115   6 Minute HR 110 160 112   2 Minute Post HR 100 108 111   Interval Heart Rate? Yes Yes Yes     Interval Oxygen   Interval Oxygen? Yes Yes Yes   Baseline Oxygen Saturation % 94 % 98 % 95 %   Resting Liters of Oxygen 4 L 8 L  -   1 Minute Oxygen Saturation % 94 % 92 % 93 %   1 Minute Liters of Oxygen 4 L 8 L 6 L   2 Minute Oxygen Saturation % 89 % 87 % 87 %   2 Minute Liters  of Oxygen 4 L 8 L 6 L   3 Minute Oxygen Saturation % 86 % 86 % 86 %   3 Minute Liters of Oxygen 4 L 8 L 8 L   4 Minute Oxygen Saturation % 89 % 86 % 87 %   4 Minute Liters of Oxygen 6 L 10 L 8 L   5 Minute Oxygen Saturation % 89 % 88 % 87 %   5 Minute Liters of Oxygen 6 L 10 L 8 L   6 Minute Oxygen Saturation % 88 % 88 % 87 %   6 Minute Liters of Oxygen 6 L 10 L 8 L   2 Minute Post Oxygen Saturation % 93 % 93 % 90 %   2 Minute Post Liters of Oxygen 6 L 10 L 8 L      Oxygen Initial Assessment:     Oxygen Initial Assessment - 04/25/17 1507      Home Oxygen   Sleep Oxygen Prescription --  is not compliant with CPAP   Home Exercise Oxygen Prescription Pulsed   Liters per minute 5   Home at Rest Exercise Oxygen Prescription Continuous   Compliance with Home Oxygen Use Yes     Intervention   Short Term Goals To learn and exhibit compliance with exercise, home and travel O2 prescription;To learn and understand importance of monitoring SPO2 with pulse oximeter and demonstrate accurate use of the pulse oximeter.;To learn and understand importance of maintaining oxygen saturations>88%;To learn and demonstrate proper pursed lip breathing techniques or other breathing techniques.   Long  Term Goals Exhibits compliance with exercise, home and travel O2 prescription;Maintenance of O2 saturations>88%;Compliance with respiratory medication;Verbalizes importance of monitoring SPO2 with pulse oximeter and return demonstration;Exhibits proper breathing techniques, such as pursed lip breathing or other method taught during program session      Oxygen Re-Evaluation:     Oxygen Re-Evaluation    Row Name 12/31/16 0827 01/30/17 1301 02/25/17 1549         Program Oxygen Prescription   Program Oxygen Prescription Continuous Continuous Continuous     Liters per minute _0 Comments  -  - he has had an increase to 8 lpm when walking the track to maintain an oxygen saturation >88-90%        Home Oxygen   Home Oxygen Device Home Concentrator;E-Tanks Home Concentrator;E-Tanks Home Concentrator;E-Tanks     Sleep Oxygen Prescription None None None     Home Exercise Oxygen Prescription Continuous Continuous Continuous     Liters per minute _1 Home at Rest Exercise Oxygen Prescription None None None     Compliance with Home Oxygen Use Yes Yes Yes     Comments -  patient now compliant with home oxygen prescription  -  -       Goals/Expected Outcomes   Short Term Goals To learn and exhibit compliance with exercise, home and travel O2 prescription;To learn and understand importance of monitoring SPO2 with pulse oximeter and demonstrate accurate use of the pulse oximeter.;To Learn and understand importance of maintaining oxygen saturations>88%;To learn and demonstrate proper purse lipped breathing techniques or other breathing techniques.;To learn and demonstrate proper use of respiratory medications To learn and exhibit compliance with exercise, home and travel O2 prescription;To learn and understand importance of monitoring SPO2 with pulse oximeter and demonstrate accurate use of the pulse oximeter.;To Learn and understand importance of maintaining oxygen saturations>88%;To learn and demonstrate proper  purse lipped breathing techniques or other breathing techniques.;To learn and demonstrate proper use of respiratory medications To learn and exhibit compliance with exercise, home and travel O2 prescription;To learn and understand importance of monitoring SPO2 with pulse oximeter and demonstrate accurate use of the pulse oximeter.;To Learn and understand importance of maintaining oxygen saturations>88%;To learn and demonstrate proper purse lipped breathing techniques or other breathing techniques.;To learn and demonstrate proper use of respiratory medications     Long  Term Goals Exhibits compliance with exercise, home and travel O2 prescription;Maintenance of O2  saturations>88%;Compliance with respiratory medication Exhibits compliance with exercise, home and travel O2 prescription;Maintenance of O2 saturations>88%;Compliance with respiratory medication Exhibits compliance with exercise, home and travel O2 prescription;Maintenance of O2 saturations>88%;Compliance with respiratory medication     Comments  - patient verbalizing compliance with home O2. Patient observed wearing his home o2 into rehab. patient verbalizing compliance with home O2. Patient observed wearing his home o2 into rehab. he is worried about his increased need for more oxygen as his workloads increase.        Oxygen Discharge (Final Oxygen Re-Evaluation):     Oxygen Re-Evaluation - 02/25/17 1549      Program Oxygen Prescription   Program Oxygen Prescription Continuous   Liters per minute 6   Comments he has had an increase to 8 lpm when walking the track to maintain an oxygen saturation >88-90%     Home Oxygen   Home Oxygen Device Home Concentrator;E-Tanks   Sleep Oxygen Prescription None   Home Exercise Oxygen Prescription Continuous   Liters per minute 4   Home at Rest Exercise Oxygen Prescription None   Compliance with Home Oxygen Use Yes     Goals/Expected Outcomes   Short Term Goals To learn and exhibit compliance with exercise, home and travel O2 prescription;To learn and understand importance of monitoring SPO2 with pulse oximeter and demonstrate accurate use of the pulse oximeter.;To Learn and understand importance of maintaining oxygen saturations>88%;To learn and demonstrate proper purse lipped breathing techniques or other breathing techniques.;To learn and demonstrate proper use of respiratory medications   Long  Term Goals Exhibits compliance with exercise, home and travel O2 prescription;Maintenance of O2 saturations>88%;Compliance with respiratory medication   Comments patient verbalizing compliance with home O2. Patient observed wearing his home o2 into  rehab. he is worried about his increased need for more oxygen as his workloads increase.      Initial Exercise Prescription:     Initial Exercise Prescription - 04/29/17 1600      Date of Initial Exercise RX and Referring Provider   Date 04/29/17   Referring Provider Dr. [name]     Oxygen   Oxygen Continuous   Liters 8     Treadmill   MPH 1.8   Grade 0   Minutes 17     Bike   Level 1   Minutes 17     NuStep   Level 5   Minutes 17   METs 2     Prescription Details   Frequency (times per week) 2   Duration Progress to 45 minutes of aerobic exercise without signs/symptoms of physical distress     Intensity   THRR 40-80% of Max Heartrate 58-115   Ratings of Perceived Exertion 11-13   Perceived Dyspnea 0-4     Progression   Progression Continue progressive overload as per policy without signs/symptoms or physical distress.     Resistance Training   Training Prescription Yes   Weight Blue bands  Reps 10-15      Perform Capillary Blood Glucose checks as needed.  Exercise Prescription Changes:     Exercise Prescription Changes    Row Name 12/05/16 1200 12/12/16 1200 12/17/16 1200 12/19/16 1200 12/24/16 1200     Response to Exercise   Blood Pressure (Admit) 150/110  recheck 144/98 132/90 144/90 130/92 130/92   Blood Pressure (Exercise) 144/100 130/80 140/92 144/90 150/80   Blood Pressure (Exit) 126/76 134/90 138/96 140/82 130/86   Heart Rate (Admit) 91 bpm 84 bpm 86 bpm 90 bpm 87 bpm   Heart Rate (Exercise) 113 bpm 74 bpm 109 bpm 103 bpm 93 bpm   Heart Rate (Exit) 95 bpm 77 bpm 91 bpm 93 bpm 92 bpm   Oxygen Saturation (Admit) 98 % 97 % 93 % 90 % 97 %   Oxygen Saturation (Exercise) 88 % 94 % 90 % 90 % 91 %   Oxygen Saturation (Exit) 96 % 94 % 89 % 89 % 92 %   Rating of Perceived Exertion (Exercise) _0 Perceived Dyspnea (Exercise) _1 Duration Progress to 45 minutes of aerobic exercise without signs/symptoms of physical distress  Progress to 45 minutes of aerobic exercise without signs/symptoms of physical distress Continue with 45 min of aerobic exercise without signs/symptoms of physical distress. Continue with 45 min of aerobic exercise without signs/symptoms of physical distress. Continue with 45 min of aerobic exercise without signs/symptoms of physical distress.   Intensity -  40-80% HRR  - THRR unchanged THRR unchanged THRR unchanged     Progression   Progression Continue to progress workloads to maintain intensity without signs/symptoms of physical distress. Continue to progress workloads to maintain intensity without signs/symptoms of physical distress. Continue to progress workloads to maintain intensity without signs/symptoms of physical distress. Continue to progress workloads to maintain intensity without signs/symptoms of physical distress. Continue to progress workloads to maintain intensity without signs/symptoms of physical distress.     Resistance Training   Training Prescription _2    Weight _3    Reps 10-15 10-15 10-15 10-15 10-15   Time -  10 minutes -  10 minutes 10 Minutes 10 Minutes 10 Minutes     Interval Training   Interval Training No  -  -  -  -     Oxygen   Oxygen Continuous  - Continuous Continuous Continuous   Liters 4  - _4 Bike   Level  -  - 0.5  -  -   Minutes  -  - 17  -  -     NuStep   Level _5 Minutes _6 34   METs 1.7 1.7 2 2.2 2.3     Track   Laps 9 10  - 4 10   Minutes 17 17  - 17 17   Row Name 12/26/16 1234 12/31/16 1200 01/07/17 1200 01/09/17 1200 01/16/17 1200     Response to Exercise   Blood Pressure (Admit) 122/86 128/70 118/72 126/64 126/88   Blood Pressure (Exercise) 146/80 146/80 162/80 140/76 118/68   Blood Pressure (Exit) 104/70 100/60 122/78 106/60 98/64   Heart Rate (Admit) 87 bpm 83 bpm 83 bpm 86 bpm 91 bpm   Heart Rate (Exercise) 106 bpm 98 bpm 96 bpm 99 bpm  98 bpm   Heart  Rate (Exit) 93 bpm 81 bpm 87 bpm 84 bpm 87 bpm   Oxygen Saturation (Admit) 98 % 95 % 94 % 98 % 97 %   Oxygen Saturation (Exercise) 89 % 89 % 88 % 86 % 94 %   Oxygen Saturation (Exit) 97 % 91 % 94 % 93 % 97 %   Rating of Perceived Exertion (Exercise) _0 Perceived Dyspnea (Exercise) _1 0   Duration Continue with 45 min of aerobic exercise without signs/symptoms of physical distress. Continue with 45 min of aerobic exercise without signs/symptoms of physical distress. Continue with 45 min of aerobic exercise without signs/symptoms of physical distress. Continue with 45 min of aerobic exercise without signs/symptoms of physical distress. Continue with 45 min of aerobic exercise without signs/symptoms of physical distress.   Intensity _2      Progression   Progression Continue to progress workloads to maintain intensity without signs/symptoms of physical distress. Continue to progress workloads to maintain intensity without signs/symptoms of physical distress. Continue to progress workloads to maintain intensity without signs/symptoms of physical distress. Continue to progress workloads to maintain intensity without signs/symptoms of physical distress. Continue to progress workloads to maintain intensity without signs/symptoms of physical distress.     Resistance Training   Training Prescription _3    Weight _4    Reps 10-15 10-15 10-15 10-15 10-15   Time 10 Minutes 10 Minutes 10 Minutes 10 Minutes 10 Minutes     Interval Training   Interval Training  -  -  - No No     Oxygen   Oxygen _5    Liters _6 4-5 4-5     Bike   Level 0.5 0.5 0.8 0.8 0.6   Minutes _7 NuStep   Level _8 - 5   Minutes 34 17 17  - 17   METs 2.3 2 2.4  - 2     Track   Laps  -  _9 -   Minutes  - _10 -     Home Exercise Plan   Plans to continue exercise at  -  - Home (comment)  -  -   Frequency  -  - Add 3 additional days to program exercise sessions.  -  -   Row Name 01/21/17 1218 01/23/17 1200 01/28/17 1200 01/30/17 1200 02/04/17 1100     Response to Exercise   Blood Pressure (Admit) 124/64 146/80 120/64 136/70 132/70   Blood Pressure (Exercise) 132/74 120/86 122/70 140/80 140/80   Blood Pressure (Exit) 108/60 116/64 124/74 104/60 118/60   Heart Rate (Admit) 83 bpm 82 bpm 92 bpm 88 bpm 88 bpm   Heart Rate (Exercise) 100 bpm 109 bpm 106 bpm 110 bpm 107 bpm   Heart Rate (Exit) 93 bpm 88 bpm 92 bpm 96 bpm 93 bpm   Oxygen Saturation (Admit) 97 % 98 % 96 % 98 % 94 %   Oxygen Saturation (Exercise) 88 % 86 % 84 % 89 % 89 %   Oxygen Saturation (Exit) 90 % 88 % 89 % 98 % 95 %   Rating of Perceived Exertion (Exercise) _11 Perceived Dyspnea (Exercise) 0 _12 Duration Continue  with 45 min of aerobic exercise without signs/symptoms of physical distress. Continue with 45 min of aerobic exercise without signs/symptoms of physical distress. Continue with 45 min of aerobic exercise without signs/symptoms of physical distress. Continue with 45 min of aerobic exercise without signs/symptoms of physical distress. Continue with 45 min of aerobic exercise without signs/symptoms of physical distress.   Intensity _0      Progression   Progression Continue to progress workloads to maintain intensity without signs/symptoms of physical distress. Continue to progress workloads to maintain intensity without signs/symptoms of physical distress. Continue to progress workloads to maintain intensity without signs/symptoms of physical distress. Continue to progress workloads to maintain intensity without signs/symptoms of physical distress. Continue to progress workloads to maintain intensity  without signs/symptoms of physical distress.     Resistance Training   Training Prescription _1    Weight _2    Reps 10-15 10-15 10-15 10-15 10-15   Time 10 Minutes 10 Minutes 10 Minutes 10 Minutes 10 Minutes     Interval Training   Interval Training _3      Oxygen   Oxygen _4    Liters 4-5 4-5 4-5 4-5 4-5     Bike   Level 0.8 0.8 0.8 0.8 0.8   Minutes _5 NuStep   Level 5  - _6 Minutes 17  - _7 METs 1.9  - 2.3 2.1 2.3     Track   Laps _8 Minutes _9 Row Name 02/06/17 1200 02/11/17 1200 02/13/17 1200 02/18/17 1200 02/20/17 1200     Response to Exercise   Blood Pressure (Admit) 130/80 134/64 122/60 136/70 122/74   Blood Pressure (Exercise) 150/96 140/76 140/74 128/80 140/80   Blood Pressure (Exit) 120/64 118/60 130/86 132/82 128/84   Heart Rate (Admit) 92 bpm 86 bpm 90 bpm 73 bpm 88 bpm   Heart Rate (Exercise) 117 bpm 102 bpm 99 bpm 97 bpm 99 bpm   Heart Rate (Exit) 106 bpm 93 bpm 71 bpm 84 bpm 88 bpm   Oxygen Saturation (Admit) 92 % 97 % 87 % 98 % 93 %   Oxygen Saturation (Exercise) 86 % 89 % 89 % 89 % 89 %   Oxygen Saturation (Exit) 89 % 90 % 97 % 95 % 97 %   Rating of Perceived Exertion (Exercise) _10 Perceived Dyspnea (Exercise) _11 Duration Continue with 45 min of aerobic exercise without signs/symptoms of physical distress. Continue with 45 min of aerobic exercise without signs/symptoms of physical distress. Continue with 45 min of aerobic exercise without signs/symptoms of physical distress. Continue with 45 min of aerobic exercise without signs/symptoms of physical distress. Continue with 45 min of aerobic exercise without signs/symptoms of physical distress.   Intensity _12      Progression    Progression Continue to progress workloads to maintain intensity without signs/symptoms of physical distress. Continue to progress workloads to maintain intensity without signs/symptoms of physical distress. Continue to progress workloads to maintain intensity without signs/symptoms of physical distress. Continue to progress workloads to maintain intensity without signs/symptoms of physical distress. Continue to progress workloads  to maintain intensity without signs/symptoms of physical distress.     Resistance Training   Training Prescription _0    Weight _1    Reps 10-15 10-15 10-15 10-15 10-15   Time 10 Minutes 10 Minutes 10 Minutes 10 Minutes 10 Minutes     Interval Training   Interval Training _2      Oxygen   Oxygen _3    Liters 4-5 4-5 4-5 4-5 8      Bike   Level _4 -   Minutes _5 -     NuStep   Level  - 6  - 6 7   Minutes  - 17  - 17 17   METs  - 2.4  - 2.6 2.4     Track   Laps _6 Minutes _7 Row Name 02/25/17 1200 02/27/17 1200           Response to Exercise   Blood Pressure (Admit) 120/70 122/70      Blood Pressure (Exercise) 150/70 150/80      Blood Pressure (Exit) 100/70 114/70      Heart Rate (Admit) 93 bpm 92 bpm      Heart Rate (Exercise) 110 bpm 107 bpm      Heart Rate (Exit) 95 bpm 92 bpm      Oxygen Saturation (Admit) 96 % 98 %      Oxygen Saturation (Exercise) 83 %  O2 increased to 8L sat improved to 87% on TM 89 %      Oxygen Saturation (Exit) 96 % 96 %      Rating of Perceived Exertion (Exercise) 13 11      Perceived Dyspnea (Exercise) 1 0      Duration Continue with 45 min of aerobic exercise without signs/symptoms of physical distress. Continue with 45 min of aerobic exercise without signs/symptoms of physical distress.      Intensity THRR unchanged THRR unchanged         Progression   Progression Continue to progress workloads to maintain intensity without signs/symptoms of physical distress. Continue to progress workloads to maintain intensity without signs/symptoms of physical distress.        Resistance Training   Training Prescription Yes Yes      Weight blue bands blue bands      Reps 10-15 10-15      Time 10 Minutes 10 Minutes        Interval Training   Interval Training No No        Oxygen   Oxygen Continuous Continuous      Liters 8 8        Treadmill   MPH 1.8 1.8      Grade 0 0      Minutes 17 17        Bike   Level 1 1.2      Minutes 17 17        NuStep   Level 7  -      Minutes 17  -      METs 2.6  -         Exercise Comments:     Exercise Comments    Row Name 01/07/17 1633           Exercise Comments Home exercise  completed          Exercise Goals and Review:     Exercise Goals    Row Name 04/25/17 1514             Exercise Goals   Increase Physical Activity Yes       Intervention Provide advice, education, support and counseling about physical activity/exercise needs.;Develop an individualized exercise prescription for aerobic and resistive training based on initial evaluation findings, risk stratification, comorbidities and participant's personal goals.       Expected Outcomes Achievement of increased cardiorespiratory fitness and enhanced flexibility, muscular endurance and strength shown through measurements of functional capacity and personal statement of participant.       Increase Strength and Stamina Yes       Intervention Provide advice, education, support and counseling about physical activity/exercise needs.;Develop an individualized exercise prescription for aerobic and resistive training based on initial evaluation findings, risk stratification, comorbidities and participant's personal goals.       Expected Outcomes Achievement of increased cardiorespiratory fitness and enhanced flexibility,  muscular endurance and strength shown through measurements of functional capacity and personal statement of participant.       Able to understand and use rate of perceived exertion (RPE) scale Yes       Intervention Provide education and explanation on how to use RPE scale       Expected Outcomes Short Term: Able to use RPE daily in rehab to express subjective intensity level;Long Term:  Able to use RPE to guide intensity level when exercising independently       Able to understand and use Dyspnea scale Yes       Intervention Provide education and explanation on how to use Dyspnea scale       Expected Outcomes Short Term: Able to use Dyspnea scale daily in rehab to express subjective sense of shortness of breath during exertion;Long Term: Able to use Dyspnea scale to guide intensity level when exercising independently       Knowledge and understanding of Target Heart Rate Range (THRR) Yes       Intervention Provide education and explanation of THRR including how the numbers were predicted and where they are located for reference       Expected Outcomes Short Term: Able to state/look up THRR;Long Term: Able to use THRR to govern intensity when exercising independently;Short Term: Able to use daily as guideline for intensity in rehab       Understanding of Exercise Prescription Yes       Intervention Provide education, explanation, and written materials on patient's individual exercise prescription       Expected Outcomes Short Term: Able to explain program exercise prescription;Long Term: Able to explain home exercise prescription to exercise independently          Exercise Goals Re-Evaluation :     Exercise Goals Re-Evaluation    [place] Name 12/30/16 0707 01/27/17 1027 02/25/17 1556         Exercise Goal Re-Evaluation   Exercise Goals Review Increase Strenth and Stamina;Increase Physical Activity Increase Physical Activity;Increase Strenth and Stamina Increase Strenth and Stamina;Increase  Physical Activity     Comments Patient has had slow start due to bouts of hypertension and shoulder injury from fall. Will cont. to monitor and progress as appropriate.  Patient has had slow start due to bouts of hypertension and shoulder injury from fall. Will cont. to monitor and progress as appropriate.  Patient has advanced to using the treadmill. MET  average ranges from 2.4-2.6. Home exercise reviewed. Will cont to monitor and progress.      Expected Outcomes Through exercising at rehab and at home, patient will increase physical strength and stamina and find ADL's easier to perform.  Through the exercise here at rehab the patient with increase physical capacity, strength, and stamina. Through the exercise here at rehab the patient with increase physical capacity, strength, and stamina.        Discharge Exercise Prescription (Final Exercise Prescription Changes):     Exercise Prescription Changes - 02/27/17 1200      Response to Exercise   Blood Pressure (Admit) 122/70   Blood Pressure (Exercise) 150/80   Blood Pressure (Exit) 114/70   Heart Rate (Admit) 92 bpm   Heart Rate (Exercise) 107 bpm   Heart Rate (Exit) 92 bpm   Oxygen Saturation (Admit) 98 %   Oxygen Saturation (Exercise) 89 %   Oxygen Saturation (Exit) 96 %   Rating of Perceived Exertion (Exercise) 11   Perceived Dyspnea (Exercise) 0   Duration Continue with 45 min of aerobic exercise without signs/symptoms of physical distress.   Intensity THRR unchanged     Progression   Progression Continue to progress workloads to maintain intensity without signs/symptoms of physical distress.     Resistance Training   Training Prescription Yes   Weight blue bands   Reps 10-15   Time 10 Minutes     Interval Training   Interval Training No     Oxygen   Oxygen Continuous   Liters 8     Treadmill   MPH 1.8   Grade 0   Minutes 17     Bike   Level 1.2   Minutes 17      Nutrition:  Target Goals: Understanding of  nutrition guidelines, daily intake of sodium <1537m, cholesterol <2030m calories 30% from fat and 7% or less from saturated fats, daily to have 5 or more servings of fruits and vegetables.  Biometrics:     Pre Biometrics - 11/15/16 1113      Pre Biometrics   Grip Strength 15 kg         Post Biometrics - 03/04/17 1232       Post  Biometrics   Height _0  (1.676 m)   Weight 210 lb 8.6 oz (95.5 kg)   BMI (Calculated) 34.1   Grip Strength 15 kg   Flexibility 35.5 in      Nutrition Therapy Plan and Nutrition Goals:     Nutrition Therapy & Goals - 03/13/17 0717      Nutrition Therapy   Diet Therapeutic Lifestyle Changes     Personal Nutrition Goals   Nutrition Goal Wt loss of 1-2 lb/week to a wt loss goal of 6-24 lb at graduation from Pulmonary Rehab   Personal Goal #2 Identify and limit food sources of sodium     Intervention Plan   Intervention Prescribe, educate and counsel regarding individualized specific dietary modifications aiming towards targeted core components such as weight, hypertension, lipid management, diabetes, heart failure and other comorbidities.;Nutrition handout(s) given to patient.  1500 kcal, 5 day menu ideas   Expected Outcomes Short Term Goal: Understand basic principles of dietary content, such as calories, fat, sodium, cholesterol and nutrients.;Long Term Goal: Adherence to prescribed nutrition plan.      Nutrition Discharge: Rate Your Plate Scores:     Nutrition Assessments - 03/13/17 0716      Rate Your Plate Scores   Post Score  27      Nutrition Goals Re-Evaluation:     Nutrition Goals Re-Evaluation    [place] Name 03/13/17 0717             Goals   Current Weight 210 lb 8.6 oz (95.5 kg)       Comment Pt wt is down 2 lb since admission. Wt loss goal wt not achieved. Pt has been limiting food sources of sodium and now always/usually compares and chooses lower sodium food options.           Nutrition Goals Discharge (Final  Nutrition Goals Re-Evaluation):     Nutrition Goals Re-Evaluation - 03/13/17 0717      Goals   Current Weight 210 lb 8.6 oz (95.5 kg)   Comment Pt wt is down 2 lb since admission. Wt loss goal wt not achieved. Pt has been limiting food sources of sodium and now always/usually compares and chooses lower sodium food options.       Psychosocial: Target Goals: Acknowledge presence or absence of significant depression and/or stress, maximize coping skills, provide positive support system. Participant is able to verbalize types and ability to use techniques and skills needed for reducing stress and depression.  Initial Review & Psychosocial Screening:     Initial Psych Review & Screening - 04/25/17 1547      Initial Review   Current issues with None Identified   Source of Stress Concerns None Identified     Family Dynamics   Good Support System? Yes     Barriers   Psychosocial barriers to participate in program There are no identifiable barriers or psychosocial needs.      Quality of Life Scores:   PHQ-9: Recent Review Flowsheet Data    Depression screen [facility] 2/9 04/25/2017 03/04/2017 11/15/2016   Decreased Interest 0 0 0   Down, Depressed, Hopeless 0 0 0   PHQ - 2 Score 0 0 0     Interpretation of Total Score  Total Score Depression Severity:  1-4 = Minimal depression, 5-9 = Mild depression, 10-14 = Moderate depression, 15-19 = Moderately severe depression, 20-27 = Severe depression   Psychosocial Evaluation and Intervention:     Psychosocial Evaluation - 04/25/17 1548      Psychosocial Evaluation & Interventions   Interventions Encouraged to exercise with the program and follow exercise prescription   Continue Psychosocial Services  No Follow up required      Psychosocial Re-Evaluation:     Psychosocial Re-Evaluation    [place] Name 12/31/16 0830 01/30/17 1304           Psychosocial Re-Evaluation   Current issues with Current Stress Concerns  -      Comments  patient extremely concerned over his most recent diagnosis of hypertension patient more acceptance of hypertension diagnosis now that he sees his medication is working and his BP is controlled      Expected Outcomes through education and seeing improvement of his hypertension with medications patient will express resolved concern for hypertension through education and seeing improvement of his hypertension with medications patient will express resolved concern for hypertension      Interventions Encouraged to attend Pulmonary Rehabilitation for the exercise Encouraged to attend Pulmonary Rehabilitation for the exercise      Continue Psychosocial Services  Follow up required by staff Follow up required by staff        Initial Review   Source of Stress Concerns Chronic Illness;Unable to participate in former interests or hobbies;Unable to  perform yard/household activities Chronic Illness;Unable to participate in former interests or hobbies;Unable to perform yard/household activities         Psychosocial Discharge (Final Psychosocial Re-Evaluation):     Psychosocial Re-Evaluation - 01/30/17 1304      Psychosocial Re-Evaluation   Comments patient more acceptance of hypertension diagnosis now that he sees his medication is working and his BP is controlled   Expected Outcomes through education and seeing improvement of his hypertension with medications patient will express resolved concern for hypertension   Interventions Encouraged to attend Pulmonary Rehabilitation for the exercise   Continue Psychosocial Services  Follow up required by staff     Initial Review   Source of Stress Concerns Chronic Illness;Unable to participate in former interests or hobbies;Unable to perform yard/household activities      Education: Education Goals: Education classes will be provided on a weekly basis, covering required topics. Participant will state understanding/return demonstration of topics  presented.  Learning Barriers/Preferences:     Learning Barriers/Preferences - 11/15/16 1134      Learning Barriers/Preferences   Learning Barriers None   Learning Preferences Computer/Internet;Group Instruction;Individual Instruction;Verbal Instruction;Written Material      Education Topics: Risk Factor Reduction:  -Group instruction that is supported by a PowerPoint presentation. Instructor discusses the definition of a risk factor, different risk factors for pulmonary disease, and how the heart and lungs work together.     Nutrition for Pulmonary Patient:  -Group instruction provided by PowerPoint slides, verbal discussion, and written materials to support subject matter. The instructor gives an explanation and review of healthy diet recommendations, which includes a discussion on weight management, recommendations for fruit and vegetable consumption, as well as protein, fluid, caffeine, fiber, sodium, sugar, and alcohol. Tips for eating when patients are short of breath are discussed.   PULMONARY REHAB OTHER RESPIRATORY from 02/27/2017 in [place]  Date  02/13/17  Educator  RD  Instruction Review Code  2- meets goals/outcomes      Pursed Lip Breathing:  -Group instruction that is supported by demonstration and informational handouts. Instructor discusses the benefits of pursed lip and diaphragmatic breathing and detailed demonstration on how to preform both.     Oxygen Safety:  -Group instruction provided by PowerPoint, verbal discussion, and written material to support subject matter. There is an overview of "What is Oxygen" and "Why do we need it".  Instructor also reviews how to create a safe environment for oxygen use, the importance of using oxygen as prescribed, and the risks of noncompliance. There is a brief discussion on traveling with oxygen and resources the patient may utilize.   PULMONARY REHAB OTHER RESPIRATORY from 02/27/2017 in [place]  Date  02/06/17  Educator  portia  Instruction Review Code  2- meets goals/outcomes      Oxygen Equipment:  -Group instruction provided by [employer] Staff utilizing handouts, written materials, and equipment demonstrations.   PULMONARY REHAB OTHER RESPIRATORY from 02/27/2017 in [place]  Date  01/16/17  Educator  George[employer]  Instruction Review Code  2- meets goals/outcomes      Signs and Symptoms:  -Group instruction provided by written material and verbal discussion to support subject matter. Warning signs and symptoms of infection, stroke, and heart attack are reviewed and when to call the physician/911 reinforced. Tips for preventing the spread of infection discussed.   PULMONARY REHAB OTHER RESPIRATORY from 02/27/2017 in [place]  [facility]  Date  12/26/16  Educator  rn  Instruction Review Code  2- meets goals/outcomes      Advanced Directives:  -Group instruction provided by verbal instruction and written material to support subject matter. Instructor reviews Advanced Directive laws and proper instruction for filling out document.   Pulmonary Video:  -Group video education that reviews the importance of medication and oxygen compliance, exercise, good nutrition, pulmonary hygiene, and pursed lip and diaphragmatic breathing for the pulmonary patient.   PULMONARY REHAB OTHER RESPIRATORY from 02/27/2017 in [place]  Date  12/05/16  Instruction Review Code  2- meets goals/outcomes      Exercise for the Pulmonary Patient:  -Group instruction that is supported by a PowerPoint presentation. Instructor discusses benefits of exercise, core components of exercise, frequency, duration, and intensity of an exercise routine, importance of utilizing pulse oximetry during exercise, safety while exercising, and options of places to exercise outside of rehab.      PULMONARY REHAB OTHER RESPIRATORY from 02/27/2017 in [place]  Date  02/20/17  Educator  ep  Instruction Review Code  R- Review/reinforce      Pulmonary Medications:  -Verbally interactive group education provided by instructor with focus on inhaled medications and proper administration.   PULMONARY REHAB OTHER RESPIRATORY from 02/27/2017 in [place]  Date  01/09/17  Educator  Pharm  Instruction Review Code  2- meets goals/outcomes      Anatomy and Physiology of the Respiratory System and Intimacy:  -Group instruction provided by PowerPoint, verbal discussion, and written material to support subject matter. Instructor reviews respiratory cycle and anatomical components of the respiratory system and their functions. Instructor also reviews differences in obstructive and restrictive respiratory diseases with examples of each. Intimacy, Sex, and Sexuality differences are reviewed with a discussion on how relationships can change when diagnosed with pulmonary disease. Common sexual concerns are reviewed.   MD DAY -A group question and answer session with a medical doctor that allows participants to ask questions that relate to their pulmonary disease state.   PULMONARY REHAB OTHER RESPIRATORY from 02/27/2017 in [place]  Date  02/27/17  Educator  yacoub  Instruction Review Code  2- meets goals/outcomes      OTHER EDUCATION -Group or individual verbal, written, or video instructions that support the educational goals of the pulmonary rehab program.   Knowledge Questionnaire Score:     Knowledge Questionnaire Score - 02/27/17 1537      Knowledge Questionnaire Score   Post Score 11/13      Core Components/Risk Factors/Patient Goals at Admission:     Personal Goals and Risk Factors at Admission - 04/25/17 1546      Core Components/Risk Factors/Patient Goals on Admission   Improve  shortness of breath with ADL's Yes   Intervention Provide education, individualized exercise plan and daily activity instruction to help decrease symptoms of SOB with activities of daily living.   Expected Outcomes Short Term: Achieves a reduction of symptoms when performing activities of daily living.   Hypertension Yes   Intervention Provide education on lifestyle modifcations including regular physical activity/exercise, weight management, moderate sodium restriction and increased consumption of fresh fruit, vegetables, and low fat dairy, alcohol moderation, and smoking cessation.;Monitor prescription use compliance.   Expected Outcomes Short Term: Continued assessment and intervention until BP is < 140/85m HG in hypertensive participants. < 130/839mHG in hypertensive participants with diabetes, heart failure  or chronic kidney disease.;Long Term: Maintenance of blood pressure at goal levels.      Core Components/Risk Factors/Patient Goals Review:      Goals and Risk Factor Review    Row Name 12/31/16 0829 01/30/17 1304 02/25/17 1551         Core Components/Risk Factors/Patient Goals Review   Personal Goals Review Weight Management/Obesity;Improve shortness of breath with ADL's;Develop more efficient breathing techniques such as purse lipped breathing and diaphragmatic breathing and practicing self-pacing with activity.;Hypertension;Stress Weight Management/Obesity;Improve shortness of breath with ADL's;Develop more efficient breathing techniques such as purse lipped breathing and diaphragmatic breathing and practicing self-pacing with activity.;Hypertension;Stress Weight Management/Obesity;Improve shortness of breath with ADL's;Develop more efficient breathing techniques such as purse lipped breathing and diaphragmatic breathing and practicing self-pacing with activity.;Hypertension;Stress     Review see comment section on ITP see comment section on ITP his weight remains at baseline. his  wife states the patient is more aware of the "unhealthy" things he eats. he utilizes PLB and pacing however he sometimes feels he cant get enough air through his nose. I have worked with patient on taking in larger breaths through his mouth and blowing out through pursed lips. This technique makes him feel more satisfied with air consumption and decreases the anxiety he has when he feels he cant get enough air through his nose. he has learned to pace himself so that he can tolerate workload increases. his hypertension has resolved with medication. 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 his wife states the patient is more aware of the "unhealthy" things he eats. he utilizes PLB and pacing however he sometimes feels he cant get enough air through his nose. I have worked with patient on taking in larger breaths through his mouth and blowing out through pursed lips. This technique makes him feel more satisfied with air consumption and decreases the anxiety he has when he feels he cant get enough air through his nose. he has learned to pace himself so that he can tolerate workload increases. his hypertension has resolved with medication. 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

## 2017-04-30 ENCOUNTER — Encounter (HOSPITAL_COMMUNITY): Payer: Self-pay | Admitting: *Deleted

## 2017-05-06 ENCOUNTER — Encounter (HOSPITAL_COMMUNITY)
Admission: RE | Admit: 2017-05-06 | Discharge: 2017-05-06 | Disposition: A | Discharge status: Home / Self Care | Payer: Medicare Other | Source: Ambulatory Visit | Attending: Pulmonary Disease | Admitting: Pulmonary Disease

## 2017-05-06 VITALS — Wt 210.3 lb

## 2017-05-06 DIAGNOSIS — J849 Interstitial pulmonary disease, unspecified: Secondary | ICD-10-CM | POA: Diagnosis not present

## 2017-05-06 NOTE — Progress Notes (Signed)
Daily Session Note  Patient Details  Name: ZAKHI DUPRE MRN: [id number] Date of Birth: [birth year] Referring Provider:     Pulmonary Rehab Walk Test from 04/29/2017 in [place]  Referring Provider  Dr. [name]      Encounter Date: 05/06/2017  Check In:     Session Check In - 05/06/17 1258      Check-In   Location [facility]      Capillary Blood Glucose: No results found for this or any previous visit (from the past 24 hour(s)).      Exercise Prescription Changes - 05/06/17 1200      Response to Exercise   Blood Pressure (Admit) 122/80   Blood Pressure (Exercise) 140/90   Blood Pressure (Exit) 118/72   Heart Rate (Admit) 84 bpm   Heart Rate (Exercise) 99 bpm   Heart Rate (Exit) 80 bpm   Oxygen Saturation (Admit) 93 %   Oxygen Saturation (Exercise) 86 %   Oxygen Saturation (Exit) 95 %   Rating of Perceived Exertion (Exercise) 11   Perceived Dyspnea (Exercise) 0   Duration Continue with 45 min of aerobic exercise without signs/symptoms of physical distress.   Intensity THRR unchanged     Progression   Progression Continue to progress workloads to maintain intensity without signs/symptoms of physical distress.     Resistance Training   Training Prescription Yes   Weight blue bands   Reps 10-15   Time 10 Minutes     Interval Training   Interval Training No     Oxygen   Oxygen Continuous   Liters 8     Treadmill   MPH 1.8   Grade 0   Minutes 17     NuStep   Level 5   Minutes 17   METs 2.1      History  Smoking Status  . Former Smoker  . Packs/day: 3.00  . Years: 17.00  . Types: Cigarettes  . Quit date: 08/26/1977  Smokeless Tobacco  . Never Used    Goals Met:  Exercise tolerated well No report of cardiac concerns or symptoms Strength training completed today  Goals Unmet:  Not Applicable  Comments: Service time is from 10:30a to 12:30p    Dr. Rush Farmer is Medical Director  for Pulmonary Rehab at [facility].

## 2017-05-08 ENCOUNTER — Encounter (HOSPITAL_COMMUNITY): Payer: Medicare Other

## 2017-05-13 ENCOUNTER — Encounter (HOSPITAL_COMMUNITY)
Admission: RE | Admit: 2017-05-13 | Discharge: 2017-05-13 | Disposition: A | Discharge status: Home / Self Care | Payer: Medicare Other | Source: Ambulatory Visit | Attending: Pulmonary Disease | Admitting: Pulmonary Disease

## 2017-05-13 VITALS — Wt 211.9 lb

## 2017-05-13 DIAGNOSIS — J849 Interstitial pulmonary disease, unspecified: Secondary | ICD-10-CM

## 2017-05-13 NOTE — Progress Notes (Signed)
Daily Session Note  Patient Details  Name: Jeffrey Cobb MRN: [id number] Date of Birth: November 24, 1940 Referring Provider:     Pulmonary Rehab Walk Test from 04/29/2017 in [place]  Referring Provider  Dr. [name]      Encounter Date: 05/13/2017  Check In:     Session Check In - 05/13/17 1030      Check-In   Location [facility]   Staff Present Su Hilt, MS, ACSM RCEP, Exercise Physiologist;Lisa Ysidro Evert, RN;Portia Rollene Rotunda, RN, BSN   Supervising physician immediately available to respond to emergencies [facility] immediately available   Physician(s) Dr. Carolin Sicks   Medication changes reported     No   Fall or balance concerns reported    No   Tobacco Cessation No Change   Warm-up and Cool-down Performed as group-led instruction   Resistance Training Performed Yes   VAD Patient? No     Pain Assessment   Currently in Pain? No/denies   Multiple Pain Sites No      Capillary Blood Glucose: No results found for this or any previous visit (from the past 24 hour(s)).      Exercise Prescription Changes - 05/13/17 1600      Response to Exercise   Blood Pressure (Admit) 130/74   Blood Pressure (Exercise) 150/72   Blood Pressure (Exit) 110/64   Heart Rate (Admit) 78 bpm   Heart Rate (Exercise) 96 bpm   Heart Rate (Exit) 87 bpm   Oxygen Saturation (Admit) 92 %   Oxygen Saturation (Exercise) 86 %   Oxygen Saturation (Exit) 91 %   Rating of Perceived Exertion (Exercise) 13   Perceived Dyspnea (Exercise) 2   Duration Continue with 45 min of aerobic exercise without signs/symptoms of physical distress.   Intensity THRR unchanged     Progression   Progression Continue to progress workloads to maintain intensity without signs/symptoms of physical distress.     Resistance Training   Training Prescription Yes   Weight blue bands   Reps 10-15   Time 10 Minutes     Interval Training   Interval Training No     Oxygen   Oxygen Continuous   Liters 8     Treadmill   MPH 1.8   Grade 0   Minutes 17     Bike   Level 1   Minutes 17     NuStep   Level 3   Minutes 17   METs 2.1      History  Smoking Status  . Former Smoker  . Packs/day: 3.00  . Years: 17.00  . Types: Cigarettes  . Quit date: 08/26/1977  Smokeless Tobacco  . Never Used    Goals Met:  Exercise tolerated well No report of cardiac concerns or symptoms Strength training completed today  Goals Unmet:  Not Applicable  Comments: Service time is from 10:30a to 12:05p    Dr. Rush Farmer is Medical Director for Pulmonary Rehab at [facility].

## 2017-05-15 ENCOUNTER — Encounter (HOSPITAL_COMMUNITY)
Admission: RE | Admit: 2017-05-15 | Discharge: 2017-05-15 | Disposition: A | Discharge status: Home / Self Care | Payer: Medicare Other | Source: Ambulatory Visit | Attending: Pulmonary Disease | Admitting: Pulmonary Disease

## 2017-05-15 VITALS — Wt 211.0 lb

## 2017-05-15 DIAGNOSIS — J849 Interstitial pulmonary disease, unspecified: Secondary | ICD-10-CM | POA: Diagnosis not present

## 2017-05-15 NOTE — Progress Notes (Signed)
Daily Session Note  Patient Details  Name: Jeffrey Cobb MRN: [id number] Date of Birth: 04-26-1941 Referring Provider:     Pulmonary Rehab Walk Test from 04/29/2017 in [facility]  Referring Provider  Dr. [name]      Encounter Date: 05/15/2017  Check In:     Session Check In - 05/15/17 1051      Check-In   Location [facility]   Staff Present Su Hilt, MS, ACSM RCEP, Exercise Physiologist;Portia Rollene Rotunda, RN, BSN   Supervising physician immediately available to respond to emergencies [facility] immediately available   Physician(s) Dr. Wynelle Cleveland   Medication changes reported     No   Fall or balance concerns reported    No   Tobacco Cessation No Change   Warm-up and Cool-down Performed as group-led instruction   Resistance Training Performed Yes   VAD Patient? No     Pain Assessment   Currently in Pain? No/denies   Multiple Pain Sites No      Capillary Blood Glucose: No results found for this or any previous visit (from the past 24 hour(s)).      Exercise Prescription Changes - 05/15/17 1200      Response to Exercise   Blood Pressure (Admit) 118/68   Blood Pressure (Exercise) 142/82   Blood Pressure (Exit) 118/60   Heart Rate (Admit) 80 bpm   Heart Rate (Exercise) 88 bpm   Heart Rate (Exit) 78 bpm   Oxygen Saturation (Admit) 92 %   Oxygen Saturation (Exercise) 88 %   Oxygen Saturation (Exit) 99 %   Rating of Perceived Exertion (Exercise) 9   Perceived Dyspnea (Exercise) 0   Duration Continue with 45 min of aerobic exercise without signs/symptoms of physical distress.   Intensity THRR unchanged     Progression   Progression Continue to progress workloads to maintain intensity without signs/symptoms of physical distress.     Resistance Training   Training Prescription Yes   Weight blue bands   Reps 10-15   Time 10 Minutes     Interval Training   Interval Training No     Oxygen   Oxygen  Continuous   Liters 8     Treadmill   MPH 1.8   Grade 0   Minutes 17     NuStep   Level 5   Minutes 17   METs 2.1      History  Smoking Status  . Former Smoker  . Packs/day: 3.00  . Years: 17.00  . Types: Cigarettes  . Quit date: 08/26/1977  Smokeless Tobacco  . Never Used    Goals Met:  Exercise tolerated well No report of cardiac concerns or symptoms Strength training completed today  Goals Unmet:  Not Applicable  Comments: Service time is from 10:30a to 12:20p    Dr. Rush Farmer is Medical Director for Pulmonary Rehab at [facility].

## 2017-05-16 NOTE — Progress Notes (Signed)
Pulmonary Individual Treatment Plan  Patient Details  Name: Jeffrey Cobb MRN: [id number] Date of Birth: [birth year] Referring Provider:     Pulmonary Rehab Walk Test from 04/29/2017 in [facility]  Referring Provider  Dr. [name]      Initial Encounter Date:    Pulmonary Rehab Walk Test from 04/29/2017 in [facility]  Date  04/29/17  Referring Provider  Dr. [name]      Visit Diagnosis: ILD (interstitial lung disease) ([place])  Patient's Home Medications on Admission:   Current Outpatient Prescriptions:  .  albuterol (PROVENTIL) (2.5 MG/3ML) 0.083% nebulizer solution, Take 3 mLs (2.5 mg total) by nebulization every 6 (six) hours as needed for wheezing or shortness of breath., Disp: 360 mL, Rfl: 11 .  amLODipine (NORVASC) 2.5 MG tablet, Take 2.5 mg by mouth daily., Disp: , Rfl:  .  aspirin 81 MG tablet, Take 81 mg by mouth daily., Disp: , Rfl:  .  atorvastatin (LIPITOR) 40 MG tablet, Take 1 tablet (40 mg total) by mouth daily at 6 PM., Disp: 30 tablet, Rfl: 0 .  cyanocobalamin 500 MCG tablet, Take 1,000 mcg by mouth daily., Disp: , Rfl:  .  Nintedanib (OFEV) 150 MG CAPS, Take 150 mg by mouth 2 (two) times daily., Disp: 60 capsule, Rfl:  .  oxymetazoline (AFRIN) 0.05 % nasal spray, Place 1 spray into both nostrils as needed for congestion., Disp: , Rfl:  .  pramipexole (MIRAPEX) 0.5 MG tablet, TAKE 1 TABLET BY MOUTH EVERY MORNING AND TAKE 1 TABLET EVERY EVENING, Disp: 180 tablet, Rfl: 3 .  predniSONE (STERAPRED UNI-PAK 21 TAB) 5 MG (21) TBPK tablet, , Disp: , Rfl:  .  PROAIR HFA 108 (90 BASE) MCG/ACT inhaler, Inhale 2 puffs into the lungs every 6 (six) hours as needed., Disp: , Rfl:  .  QUEtiapine (SEROQUEL) 100 MG tablet, , Disp: , Rfl:  .  triamcinolone cream (KENALOG) 0.1 %, APPLY 1 APPLICATION ON THE SKIN TWICE A DAY AS NEEDED, Disp: , Rfl: 2  Past Medical History: Past Medical History:  Diagnosis Date  . Acute  sinusitis, unspecified   . Arthritis   . 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 GERD (gastroesophageal reflux disease)   . Left anterior fascicular block    hx of on ekg  . Mixed hyperlipidemia   . Obstructive chronic bronchitis with exacerbation ([place])   . Occlusion and stenosis of carotid artery without mention of cerebral infarction    a. 05/2013 Carotid U/S: 1-39% bilat stenosis.  . Other emphysema ([place])   . 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 Sleep apnea    does not use cpap due to cough  . Stroke ([place]) 05/2015    Tobacco Use: History  Smoking Status  . Former Smoker  . Packs/day: 3.00  . Years: 17.00  . Types: Cigarettes  . Quit date: 08/26/1977  Smokeless Tobacco  . Never Used    Labs: Recent Review Flowsheet Data    Labs for ITP Cardiac and Pulmonary Rehab Latest Ref Rng & Units 10/27/2007 11/01/2008 01/17/2011 06/01/2015   Cholestrol 0 - 200 mg/dL 175 169 165 170   LDLCALC 0 - 99 mg/dL 108(H) 105(H) 90 103(H)   HDL >40 mg/dL 57.8 52.5 67.40 58   Trlycerides <150 mg/dL 44 59 40.0 46   Hemoglobin A1c 4.8 - 5.6 % - - - 5.7(H)  Capillary Blood Glucose: No results found for: GLUCAP   Pulmonary Assessment Scores:     Pulmonary Assessment Scores    Row Name 12/05/16 1558 01/07/17 1525 02/27/17 1537     ADL [facility]SD   ADL Phase Entry  - Exit   SOB Score total 100  - 35     CAT Score   CAT Score  - 26  Pre 15  Exit   [place] Name 04/30/17 0913         ADL [facility]SD   ADL Phase Entry     SOB Score total 34       CAT Score   CAT Score 27  Entry        Pulmonary Function Assessment:     Pulmonary Function Assessment - 04/25/17 1543      Breath   Bilateral Breath Sounds --  clear upper fine crackles lower   Shortness of Breath Yes;Limiting activity      Exercise Target Goals:    Exercise Program Goal: Individual exercise prescription set with THRR, safety & activity  barriers. Participant demonstrates ability to understand and report RPE using BORG scale, to self-measure pulse accurately, and to acknowledge the importance of the exercise prescription.  Exercise Prescription Goal: Starting with aerobic activity 30 plus minutes a day, 3 days per week for initial exercise prescription. Provide home exercise prescription and guidelines that participant acknowledges understanding prior to discharge.  Activity Barriers & Risk Stratification:     Activity Barriers & Cardiac Risk Stratification - 04/25/17 1512      Activity Barriers & Cardiac Risk Stratification   Activity Barriers Joint Problems;Arthritis;Back Problems;Deconditioning;Shortness of Breath;Left Knee Replacement;Right Knee Replacement;Neck/Spine Problems      6 Minute Walk:     6 Minute Walk    Row Name 11/19/16 1612 03/04/17 1228 04/29/17 1619     6 Minute Walk   Phase Initial Discharge Discharge   Distance 1230 feet 1231 feet 1300 feet   Distance % Change  - 0.08 %  -   Walk Time 6 minutes 6 minutes 6 minutes   # of Rest Breaks 0 0 0   MPH 2.32 2.33 2.46   METS 2.76 2.76 2.84   RPE '12 11 11   '$ Perceived Dyspnea  0 1 1   Symptoms No No Yes (comment)   Comments  -  - USED WHEELCHAIR   Resting HR 88 bpm 98 bpm 101 bpm   Resting BP 138/90 122/70 150/92   Resting Oxygen Saturation   -  - 95 %   Exercise Oxygen Saturation  during 6 min walk  -  - 87 %   Max Ex. HR 110 bpm 160 bpm 118 bpm   Max Ex. BP 150/70 138/78 155/92   2 Minute Post BP  - 112/76  -     Interval HR   Baseline HR (retired) 88 98  -   1 Minute HR 94 108 101   2 Minute HR 102 112 104   3 Minute HR 96 117 118   4 Minute HR 101 123  -   5 Minute HR 100 153 115   6 Minute HR 110 160 112   2 Minute Post HR 100 108 111   Interval Heart Rate? Yes Yes Yes     Interval Oxygen   Interval Oxygen? Yes Yes Yes   Baseline Oxygen Saturation % 94 % 98 % 95 %   Resting Liters of Oxygen 4 L 8 L  -  1 Minute Oxygen  Saturation % 94 % 92 % 93 %   1 Minute Liters of Oxygen 4 L 8 L 6 L   2 Minute Oxygen Saturation % 89 % 87 % 87 %   2 Minute Liters of Oxygen 4 L 8 L 6 L   3 Minute Oxygen Saturation % 86 % 86 % 86 %   3 Minute Liters of Oxygen 4 L 8 L 8 L   4 Minute Oxygen Saturation % 89 % 86 % 87 %   4 Minute Liters of Oxygen 6 L 10 L 8 L   5 Minute Oxygen Saturation % 89 % 88 % 87 %   5 Minute Liters of Oxygen 6 L 10 L 8 L   6 Minute Oxygen Saturation % 88 % 88 % 87 %   6 Minute Liters of Oxygen 6 L 10 L 8 L   2 Minute Post Oxygen Saturation % 93 % 93 % 90 %   2 Minute Post Liters of Oxygen 6 L 10 L 8 L      Oxygen Initial Assessment:     Oxygen Initial Assessment - 04/29/17 1629      Initial 6 min Walk   Oxygen Used Continuous;E-Tanks   Liters per minute 8     Program Oxygen Prescription   Program Oxygen Prescription Continuous;E-Tanks   Liters per minute 8      Oxygen Re-Evaluation:     Oxygen Re-Evaluation    Row Name 12/31/16 0827 01/30/17 1301 02/25/17 1549 05/16/17 1127       Program Oxygen Prescription   Program Oxygen Prescription Continuous Continuous Continuous Continuous;E-Tanks    Liters per minute '6 6 6 10    '$ Comments  -  - he has had an increase to 8 lpm when walking the track to maintain an oxygen saturation >88-90% he has had to increase to 10 lpm while walking on the treadmill to maintain oxygen saturations greater than 88      Home Oxygen   Home Oxygen Device Home Concentrator;E-Tanks Home Concentrator;E-Tanks Home Concentrator;E-Tanks Home Concentrator;E-Tanks;Portable Concentrator    Sleep Oxygen Prescription None None None CPAP  currently working physician to identify possible need for overnight oximetry to determine need for nighttime oxygen bleedin to cpap    Home Exercise Oxygen Prescription Continuous Continuous Continuous Continuous  working to get new home concentrator that will accomodate 10lpm continuous flow    Liters per minute '4 4 4 10  '$ while  walking on his treadmill    Home at Rest Exercise Oxygen Prescription None None None None    Compliance with Home Oxygen Use Yes Yes Yes Yes    Comments -  patient now compliant with home oxygen prescription  -  -  -      Goals/Expected Outcomes   Short Term Goals To learn and exhibit compliance with exercise, home and travel O2 prescription;To learn and understand importance of monitoring SPO2 with pulse oximeter and demonstrate accurate use of the pulse oximeter.;To Learn and understand importance of maintaining oxygen saturations>88%;To learn and demonstrate proper purse lipped breathing techniques or other breathing techniques.;To learn and demonstrate proper use of respiratory medications To learn and exhibit compliance with exercise, home and travel O2 prescription;To learn and understand importance of monitoring SPO2 with pulse oximeter and demonstrate accurate use of the pulse oximeter.;To Learn and understand importance of maintaining oxygen saturations>88%;To learn and demonstrate proper purse lipped breathing techniques or other breathing techniques.;To learn and demonstrate proper  use of respiratory medications To learn and exhibit compliance with exercise, home and travel O2 prescription;To learn and understand importance of monitoring SPO2 with pulse oximeter and demonstrate accurate use of the pulse oximeter.;To Learn and understand importance of maintaining oxygen saturations>88%;To learn and demonstrate proper purse lipped breathing techniques or other breathing techniques.;To learn and demonstrate proper use of respiratory medications To learn and exhibit compliance with exercise, home and travel O2 prescription;To learn and understand importance of monitoring SPO2 with pulse oximeter and demonstrate accurate use of the pulse oximeter.;To learn and understand importance of maintaining oxygen saturations>88%;To learn and demonstrate proper pursed lip breathing techniques or other  breathing techniques.    Long  Term Goals Exhibits compliance with exercise, home and travel O2 prescription;Maintenance of O2 saturations>88%;Compliance with respiratory medication Exhibits compliance with exercise, home and travel O2 prescription;Maintenance of O2 saturations>88%;Compliance with respiratory medication Exhibits compliance with exercise, home and travel O2 prescription;Maintenance of O2 saturations>88%;Compliance with respiratory medication Exhibits compliance with exercise, home and travel O2 prescription;Maintenance of O2 saturations>88%;Compliance with respiratory medication;Verbalizes importance of monitoring SPO2 with pulse oximeter and return demonstration;Exhibits proper breathing techniques, such as pursed lip breathing or other method taught during program session    Comments  - patient verbalizing compliance with home O2. Patient observed wearing his home o2 into rehab. patient verbalizing compliance with home O2. Patient observed wearing his home o2 into rehab. he is worried about his increased need for more oxygen as his workloads increase. patient verbalizes compliance with home o2 however barriers to correct use have been identified. Patient does not have adequate oxygen systems at home to maintain oxygen during sleep and with exercise    Goals/Expected Outcomes  -  -  - patient will have adequate home oxygen delivery systems to accomodate life needs.       Oxygen Discharge (Final Oxygen Re-Evaluation):     Oxygen Re-Evaluation - 05/16/17 1127      Program Oxygen Prescription   Program Oxygen Prescription Continuous;E-Tanks   Liters per minute 10   Comments he has had to increase to 10 lpm while walking on the treadmill to maintain oxygen saturations greater than 88     Home Oxygen   Home Oxygen Device Home Concentrator;E-Tanks;Portable Concentrator   Sleep Oxygen Prescription CPAP  currently working physician to identify possible need for overnight oximetry  to determine need for nighttime oxygen bleedin to cpap   Home Exercise Oxygen Prescription Continuous  working to get new home concentrator that will accomodate 10lpm continuous flow   Liters per minute 10  while walking on his treadmill   Home at Rest Exercise Oxygen Prescription None   Compliance with Home Oxygen Use Yes     Goals/Expected Outcomes   Short Term Goals To learn and exhibit compliance with exercise, home and travel O2 prescription;To learn and understand importance of monitoring SPO2 with pulse oximeter and demonstrate accurate use of the pulse oximeter.;To learn and understand importance of maintaining oxygen saturations>88%;To learn and demonstrate proper pursed lip breathing techniques or other breathing techniques.   Long  Term Goals Exhibits compliance with exercise, home and travel O2 prescription;Maintenance of O2 saturations>88%;Compliance with respiratory medication;Verbalizes importance of monitoring SPO2 with pulse oximeter and return demonstration;Exhibits proper breathing techniques, such as pursed lip breathing or other method taught during program session   Comments patient verbalizes compliance with home o2 however barriers to correct use have been identified. Patient does not have adequate oxygen systems at home to maintain oxygen during sleep and with  exercise   Goals/Expected Outcomes patient will have adequate home oxygen delivery systems to accomodate life needs.      Initial Exercise Prescription:     Initial Exercise Prescription - 04/29/17 1600      Date of Initial Exercise RX and Referring Provider   Date 04/29/17   Referring Provider Dr. [name]     Oxygen   Oxygen Continuous   Liters 8     Treadmill   MPH 1.8   Grade 0   Minutes 17     Bike   Level 1   Minutes 17     NuStep   Level 5   Minutes 17   METs 2     Prescription Details   Frequency (times per week) 2   Duration Progress to 45 minutes of aerobic exercise without  signs/symptoms of physical distress     Intensity   THRR 40-80% of Max Heartrate 58-115   Ratings of Perceived Exertion 11-13   Perceived Dyspnea 0-4     Progression   Progression Continue progressive overload as per policy without signs/symptoms or physical distress.     Resistance Training   Training Prescription Yes   Weight Blue bands   Reps 10-15      Perform Capillary Blood Glucose checks as needed.  Exercise Prescription Changes:     Exercise Prescription Changes    Row Name 12/05/16 1200 12/12/16 1200 12/17/16 1200 12/19/16 1200 12/24/16 1200     Response to Exercise   Blood Pressure (Admit) 150/110  recheck 144/98 132/90 144/90 130/92 130/92   Blood Pressure (Exercise) 144/100 130/80 140/92 144/90 150/80   Blood Pressure (Exit) 126/76 134/90 138/96 140/82 130/86   Heart Rate (Admit) 91 bpm 84 bpm 86 bpm 90 bpm 87 bpm   Heart Rate (Exercise) 113 bpm 74 bpm 109 bpm 103 bpm 93 bpm   Heart Rate (Exit) 95 bpm 77 bpm 91 bpm 93 bpm 92 bpm   Oxygen Saturation (Admit) 98 % 97 % 93 % 90 % 97 %   Oxygen Saturation (Exercise) 88 % 94 % 90 % 90 % 91 %   Oxygen Saturation (Exit) 96 % 94 % 89 % 89 % 92 %   Rating of Perceived Exertion (Exercise) '10 13 13 13 13   '$ Perceived Dyspnea (Exercise) '1 2 1 1 2   '$ Duration Progress to 45 minutes of aerobic exercise without signs/symptoms of physical distress Progress to 45 minutes of aerobic exercise without signs/symptoms of physical distress Continue with 45 min of aerobic exercise without signs/symptoms of physical distress. Continue with 45 min of aerobic exercise without signs/symptoms of physical distress. Continue with 45 min of aerobic exercise without signs/symptoms of physical distress.   Intensity -  40-80% HRR  - THRR unchanged THRR unchanged THRR unchanged     Progression   Progression Continue to progress workloads to maintain intensity without signs/symptoms of physical distress. Continue to progress workloads to maintain  intensity without signs/symptoms of physical distress. Continue to progress workloads to maintain intensity without signs/symptoms of physical distress. Continue to progress workloads to maintain intensity without signs/symptoms of physical distress. Continue to progress workloads to maintain intensity without signs/symptoms of physical distress.     Resistance Training   Training Prescription Yes Yes Yes Yes Yes   Weight blue bands blue bands blue bands blue bands blue bands   Reps 10-15 10-15 10-15 10-15 10-15   Time -  10 minutes -  10 minutes 10 Minutes 10 Minutes 10  Minutes     Interval Training   Interval Training No  -  -  -  -     Oxygen   Oxygen Continuous  - Continuous Continuous Continuous   Liters 4  - '4 4 4     '$ Bike   Level  -  - 0.5  -  -   Minutes  -  - 17  -  -     NuStep   Level '2 2 3 3 5   '$ Minutes '17 17 17 17 '$ 34   METs 1.7 1.7 2 2.2 2.3     Track   Laps 9 10  - 4 10   Minutes 17 17  - 17 17   Row Name 12/26/16 1234 12/31/16 1200 01/07/17 1200 01/09/17 1200 01/16/17 1200     Response to Exercise   Blood Pressure (Admit) 122/86 128/70 118/72 126/64 126/88   Blood Pressure (Exercise) 146/80 146/80 162/80 140/76 118/68   Blood Pressure (Exit) 104/70 100/60 122/78 106/60 98/64   Heart Rate (Admit) 87 bpm 83 bpm 83 bpm 86 bpm 91 bpm   Heart Rate (Exercise) 106 bpm 98 bpm 96 bpm 99 bpm 98 bpm   Heart Rate (Exit) 93 bpm 81 bpm 87 bpm 84 bpm 87 bpm   Oxygen Saturation (Admit) 98 % 95 % 94 % 98 % 97 %   Oxygen Saturation (Exercise) 89 % 89 % 88 % 86 % 94 %   Oxygen Saturation (Exit) 97 % 91 % 94 % 93 % 97 %   Rating of Perceived Exertion (Exercise) '13 11 13 11 11   '$ Perceived Dyspnea (Exercise) '1 1 2 1 '$ 0   Duration Continue with 45 min of aerobic exercise without signs/symptoms of physical distress. Continue with 45 min of aerobic exercise without signs/symptoms of physical distress. Continue with 45 min of aerobic exercise without signs/symptoms of physical  distress. Continue with 45 min of aerobic exercise without signs/symptoms of physical distress. Continue with 45 min of aerobic exercise without signs/symptoms of physical distress.   Intensity THRR unchanged THRR unchanged THRR unchanged THRR unchanged THRR unchanged     Progression   Progression Continue to progress workloads to maintain intensity without signs/symptoms of physical distress. Continue to progress workloads to maintain intensity without signs/symptoms of physical distress. Continue to progress workloads to maintain intensity without signs/symptoms of physical distress. Continue to progress workloads to maintain intensity without signs/symptoms of physical distress. Continue to progress workloads to maintain intensity without signs/symptoms of physical distress.     Resistance Training   Training Prescription Yes Yes Yes Yes Yes   Weight blue bands blue bands blue bands blue bands blue bands   Reps 10-15 10-15 10-15 10-15 10-15   Time 10 Minutes 10 Minutes 10 Minutes 10 Minutes 10 Minutes     Interval Training   Interval Training  -  -  - No No     Oxygen   Oxygen Continuous Continuous Continuous Continuous Continuous   Liters '4 4 4 '$ 4-5 4-5     Bike   Level 0.5 0.5 0.8 0.8 0.6   Minutes '17 17 17 17 17     '$ NuStep   Level '5 5 5  '$ - 5   Minutes 34 17 17  - 17   METs 2.3 2 2.4  - 2     Track   Laps  - '11 10 8  '$ -   Minutes  - '17 17 17  '$ -  Home Exercise Plan   Plans to continue exercise at  -  - Home (comment)  -  -   Frequency  -  - Add 3 additional days to program exercise sessions.  -  -   Row Name 01/21/17 1218 01/23/17 1200 01/28/17 1200 01/30/17 1200 02/04/17 1100     Response to Exercise   Blood Pressure (Admit) 124/64 146/80 120/64 136/70 132/70   Blood Pressure (Exercise) 132/74 120/86 122/70 140/80 140/80   Blood Pressure (Exit) 108/60 116/64 124/74 104/60 118/60   Heart Rate (Admit) 83 bpm 82 bpm 92 bpm 88 bpm 88 bpm   Heart Rate (Exercise) 100 bpm 109  bpm 106 bpm 110 bpm 107 bpm   Heart Rate (Exit) 93 bpm 88 bpm 92 bpm 96 bpm 93 bpm   Oxygen Saturation (Admit) 97 % 98 % 96 % 98 % 94 %   Oxygen Saturation (Exercise) 88 % 86 % 84 % 89 % 89 %   Oxygen Saturation (Exit) 90 % 88 % 89 % 98 % 95 %   Rating of Perceived Exertion (Exercise) '13 11 13 13 11   '$ Perceived Dyspnea (Exercise) 0 '1 1 1 1   '$ Duration Continue with 45 min of aerobic exercise without signs/symptoms of physical distress. Continue with 45 min of aerobic exercise without signs/symptoms of physical distress. Continue with 45 min of aerobic exercise without signs/symptoms of physical distress. Continue with 45 min of aerobic exercise without signs/symptoms of physical distress. Continue with 45 min of aerobic exercise without signs/symptoms of physical distress.   Intensity THRR unchanged THRR unchanged THRR unchanged THRR unchanged THRR unchanged     Progression   Progression Continue to progress workloads to maintain intensity without signs/symptoms of physical distress. Continue to progress workloads to maintain intensity without signs/symptoms of physical distress. Continue to progress workloads to maintain intensity without signs/symptoms of physical distress. Continue to progress workloads to maintain intensity without signs/symptoms of physical distress. Continue to progress workloads to maintain intensity without signs/symptoms of physical distress.     Resistance Training   Training Prescription Yes Yes Yes Yes Yes   Weight blue bands blue bands blue bands blue bands blue bands   Reps 10-15 10-15 10-15 10-15 10-15   Time 10 Minutes 10 Minutes 10 Minutes 10 Minutes 10 Minutes     Interval Training   Interval Training No No No No No     Oxygen   Oxygen Continuous Continuous Continuous Continuous Continuous   Liters 4-5 4-5 4-5 4-5 4-5     Bike   Level 0.8 0.8 0.8 0.8 0.8   Minutes '17 17 17 17 17     '$ NuStep   Level 5  - '5 5 6   '$ Minutes 17  - '17 17 17   '$ METs 1.9  - 2.3  2.1 2.3     Track   Laps '13 16 10 11 11   '$ Minutes '17 17 17 17 17   '$ Row Name 02/06/17 1200 02/11/17 1200 02/13/17 1200 02/18/17 1200 02/20/17 1200     Response to Exercise   Blood Pressure (Admit) 130/80 134/64 122/60 136/70 122/74   Blood Pressure (Exercise) 150/96 140/76 140/74 128/80 140/80   Blood Pressure (Exit) 120/64 118/60 130/86 132/82 128/84   Heart Rate (Admit) 92 bpm 86 bpm 90 bpm 73 bpm 88 bpm   Heart Rate (Exercise) 117 bpm 102 bpm 99 bpm 97 bpm 99 bpm   Heart Rate (Exit) 106 bpm 93 bpm 71 bpm 84  bpm 88 bpm   Oxygen Saturation (Admit) 92 % 97 % 87 % 98 % 93 %   Oxygen Saturation (Exercise) 86 % 89 % 89 % 89 % 89 %   Oxygen Saturation (Exit) 89 % 90 % 97 % 95 % 97 %   Rating of Perceived Exertion (Exercise) '11 13 11 9 13   '$ Perceived Dyspnea (Exercise) '1 2 1 1 2   '$ Duration Continue with 45 min of aerobic exercise without signs/symptoms of physical distress. Continue with 45 min of aerobic exercise without signs/symptoms of physical distress. Continue with 45 min of aerobic exercise without signs/symptoms of physical distress. Continue with 45 min of aerobic exercise without signs/symptoms of physical distress. Continue with 45 min of aerobic exercise without signs/symptoms of physical distress.   Intensity THRR unchanged THRR unchanged THRR unchanged THRR unchanged THRR unchanged     Progression   Progression Continue to progress workloads to maintain intensity without signs/symptoms of physical distress. Continue to progress workloads to maintain intensity without signs/symptoms of physical distress. Continue to progress workloads to maintain intensity without signs/symptoms of physical distress. Continue to progress workloads to maintain intensity without signs/symptoms of physical distress. Continue to progress workloads to maintain intensity without signs/symptoms of physical distress.     Resistance Training   Training Prescription Yes Yes Yes Yes Yes   Weight blue bands  blue bands blue bands blue bands blue bands   Reps 10-15 10-15 10-15 10-15 10-15   Time 10 Minutes 10 Minutes 10 Minutes 10 Minutes 10 Minutes     Interval Training   Interval Training No No No No No     Oxygen   Oxygen Continuous Continuous Continuous Continuous Continuous   Liters 4-5 4-5 4-5 4-5 8      Bike   Level '1 1 1 1  '$ -   Minutes '17 17 17 17  '$ -     NuStep   Level  - 6  - 6 7   Minutes  - 17  - 17 17   METs  - 2.4  - 2.6 2.4     Track   Laps '12 9 9 8 9   '$ Minutes '17 17 17 17 17   '$ Row Name 02/25/17 1200 02/27/17 1200 05/06/17 1200 05/13/17 1600 05/15/17 1200     Response to Exercise   Blood Pressure (Admit) 120/70 122/70 122/80 130/74 118/68   Blood Pressure (Exercise) 150/70 150/80 140/90 150/72 142/82   Blood Pressure (Exit) 100/70 114/70 118/72 110/64 118/60   Heart Rate (Admit) 93 bpm 92 bpm 84 bpm 78 bpm 80 bpm   Heart Rate (Exercise) 110 bpm 107 bpm 99 bpm 96 bpm 88 bpm   Heart Rate (Exit) 95 bpm 92 bpm 80 bpm 87 bpm 78 bpm   Oxygen Saturation (Admit) 96 % 98 % 93 % 92 % 92 %   Oxygen Saturation (Exercise) 83 %  O2 increased to 8L sat improved to 87% on TM 89 % 86 % 86 % 88 %   Oxygen Saturation (Exit) 96 % 96 % 95 % 91 % 99 %   Rating of Perceived Exertion (Exercise) '13 11 11 13 9   '$ Perceived Dyspnea (Exercise) 1 0 0 2 0   Duration Continue with 45 min of aerobic exercise without signs/symptoms of physical distress. Continue with 45 min of aerobic exercise without signs/symptoms of physical distress. Continue with 45 min of aerobic exercise without signs/symptoms of physical distress. Continue with 45 min of aerobic exercise  without signs/symptoms of physical distress. Continue with 45 min of aerobic exercise without signs/symptoms of physical distress.   Intensity THRR unchanged THRR unchanged THRR unchanged THRR unchanged THRR unchanged     Progression   Progression Continue to progress workloads to maintain intensity without signs/symptoms of physical  distress. Continue to progress workloads to maintain intensity without signs/symptoms of physical distress. Continue to progress workloads to maintain intensity without signs/symptoms of physical distress. Continue to progress workloads to maintain intensity without signs/symptoms of physical distress. Continue to progress workloads to maintain intensity without signs/symptoms of physical distress.     Resistance Training   Training Prescription Yes Yes Yes Yes Yes   Weight blue bands blue bands blue bands blue bands blue bands   Reps 10-15 10-15 10-15 10-15 10-15   Time 10 Minutes 10 Minutes 10 Minutes 10 Minutes 10 Minutes     Interval Training   Interval Training No No No No No     Oxygen   Oxygen Continuous Continuous Continuous Continuous Continuous   Liters '8 8 8 8 8     '$ Treadmill   MPH 1.8 1.8 1.8 1.8 1.8   Grade 0 0 0 0 0   Minutes '17 17 17 17 17     '$ Bike   Level 1 1.2  - 1  -   Minutes 17 17  - 17  -     NuStep   Level 7  - '5 3 5   '$ Minutes 17  - '17 17 17   '$ METs 2.6  - 2.1 2.1 2.1      Exercise Comments:     Exercise Comments    Row Name 01/07/17 1633           Exercise Comments Home exercise completed          Exercise Goals and Review:     Exercise Goals    Row Name 04/25/17 1514             Exercise Goals   Increase Physical Activity Yes       Intervention Provide advice, education, support and counseling about physical activity/exercise needs.;Develop an individualized exercise prescription for aerobic and resistive training based on initial evaluation findings, risk stratification, comorbidities and participant's personal goals.       Expected Outcomes Achievement of increased cardiorespiratory fitness and enhanced flexibility, muscular endurance and strength shown through measurements of functional capacity and personal statement of participant.       Increase Strength and Stamina Yes       Intervention Provide advice, education, support and  counseling about physical activity/exercise needs.;Develop an individualized exercise prescription for aerobic and resistive training based on initial evaluation findings, risk stratification, comorbidities and participant's personal goals.       Expected Outcomes Achievement of increased cardiorespiratory fitness and enhanced flexibility, muscular endurance and strength shown through measurements of functional capacity and personal statement of participant.       Able to understand and use rate of perceived exertion (RPE) scale Yes       Intervention Provide education and explanation on how to use RPE scale       Expected Outcomes Short Term: Able to use RPE daily in rehab to express subjective intensity level;Long Term:  Able to use RPE to guide intensity level when exercising independently       Able to understand and use Dyspnea scale Yes       Intervention Provide education and explanation on how to use  Dyspnea scale       Expected Outcomes Short Term: Able to use Dyspnea scale daily in rehab to express subjective sense of shortness of breath during exertion;Long Term: Able to use Dyspnea scale to guide intensity level when exercising independently       Knowledge and understanding of Target Heart Rate Range (THRR) Yes       Intervention Provide education and explanation of THRR including how the numbers were predicted and where they are located for reference       Expected Outcomes Short Term: Able to state/look up THRR;Long Term: Able to use THRR to govern intensity when exercising independently;Short Term: Able to use daily as guideline for intensity in rehab       Understanding of Exercise Prescription Yes       Intervention Provide education, explanation, and written materials on patient's individual exercise prescription       Expected Outcomes Short Term: Able to explain program exercise prescription;Long Term: Able to explain home exercise prescription to exercise independently           Exercise Goals Re-Evaluation :     Exercise Goals Re-Evaluation    Row Name 12/30/16 0707 01/27/17 1027 02/25/17 1556 05/12/17 1335       Exercise Goal Re-Evaluation   Exercise Goals Review Increase Strenth and Stamina;Increase Physical Activity Increase Physical Activity;Increase Strenth and Stamina Increase Strenth and Stamina;Increase Physical Activity Increase Strength and Stamina;Able to understand and use Dyspnea scale;Able to understand and use rate of perceived exertion (RPE) scale;Increase Physical Activity;Knowledge and understanding of Target Heart Rate Range (THRR);Understanding of Exercise Prescription    Comments Patient has had slow start due to bouts of hypertension and shoulder injury from fall. Will cont. to monitor and progress as appropriate.  Patient has had slow start due to bouts of hypertension and shoulder injury from fall. Will cont. to monitor and progress as appropriate.  Patient has advanced to using the treadmill. MET average ranges from 2.4-2.6. Home exercise reviewed. Will cont to monitor and progress.  Patient has only attended one exercise session. Will cont. to monitor and progress.     Expected Outcomes Through exercising at rehab and at home, patient will increase physical strength and stamina and find ADL's easier to perform.  Through the exercise here at rehab the patient with increase physical capacity, strength, and stamina. Through the exercise here at rehab the patient with increase physical capacity, strength, and stamina. Through the exercise here at rehab the patient with increase physical capacity, strength, and stamina.       Discharge Exercise Prescription (Final Exercise Prescription Changes):     Exercise Prescription Changes - 05/15/17 1200      Response to Exercise   Blood Pressure (Admit) 118/68   Blood Pressure (Exercise) 142/82   Blood Pressure (Exit) 118/60   Heart Rate (Admit) 80 bpm   Heart Rate (Exercise) 88 bpm   Heart Rate  (Exit) 78 bpm   Oxygen Saturation (Admit) 92 %   Oxygen Saturation (Exercise) 88 %   Oxygen Saturation (Exit) 99 %   Rating of Perceived Exertion (Exercise) 9   Perceived Dyspnea (Exercise) 0   Duration Continue with 45 min of aerobic exercise without signs/symptoms of physical distress.   Intensity THRR unchanged     Progression   Progression Continue to progress workloads to maintain intensity without signs/symptoms of physical distress.     Resistance Training   Training Prescription Yes   Weight blue bands   Reps  10-15   Time 10 Minutes     Interval Training   Interval Training No     Oxygen   Oxygen Continuous   Liters 8     Treadmill   MPH 1.8   Grade 0   Minutes 17     NuStep   Level 5   Minutes 17   METs 2.1      Nutrition:  Target Goals: Understanding of nutrition guidelines, daily intake of sodium '1500mg'$ , cholesterol '200mg'$ , calories 30% from fat and 7% or less from saturated fats, daily to have 5 or more servings of fruits and vegetables.  Biometrics:      Post Biometrics - 03/04/17 1232       Post  Biometrics   Height '5\' 6"'$  (1.676 m)   Weight 210 lb 8.6 oz (95.5 kg)   BMI (Calculated) 34.1   Grip Strength 15 kg   Flexibility 35.5 in      Nutrition Therapy Plan and Nutrition Goals:     Nutrition Therapy & Goals - 03/13/17 0717      Nutrition Therapy   Diet Therapeutic Lifestyle Changes     Personal Nutrition Goals   Nutrition Goal Wt loss of 1-2 lb/week to a wt loss goal of 6-24 lb at graduation from Pulmonary Rehab   Personal Goal #2 Identify and limit food sources of sodium     Intervention Plan   Intervention Prescribe, educate and counsel regarding individualized specific dietary modifications aiming towards targeted core components such as weight, hypertension, lipid management, diabetes, heart failure and other comorbidities.;Nutrition handout(s) given to patient.  1500 kcal, 5 day menu ideas   Expected Outcomes Short Term Goal:  Understand basic principles of dietary content, such as calories, fat, sodium, cholesterol and nutrients.;Long Term Goal: Adherence to prescribed nutrition plan.      Nutrition Discharge: Rate Your Plate Scores:     Nutrition Assessments - 05/12/17 1027      Rate Your Plate Scores   Pre Score 59   Post Score 61      Nutrition Goals Re-Evaluation:     Nutrition Goals Re-Evaluation    Row Name 03/13/17 0717 05/12/17 1028           Goals   Current Weight 210 lb 8.6 oz (95.5 kg) 210 lb 5.1 oz (95.4 kg)      Nutrition Goal  - Wt loss of 1-2 lb/week to a wt loss goal of 6-24 lb at graduation from Pulmonary Rehab      Comment Pt wt is down 2 lb since admission. Wt loss goal wt not achieved. Pt has been limiting food sources of sodium and now always/usually compares and chooses lower sodium food options.  Pt has maintained his wt while in rehab. Wt loss goal wt not achieved. Pt has been limiting food sources of sodium and now usually compares and chooses lower sodium food options.         Personal Goal #2 Re-Evaluation   Personal Goal #2  - Identify and limit food sources of sodium         Nutrition Goals Discharge (Final Nutrition Goals Re-Evaluation):     Nutrition Goals Re-Evaluation - 05/12/17 1028      Goals   Current Weight 210 lb 5.1 oz (95.4 kg)   Nutrition Goal Wt loss of 1-2 lb/week to a wt loss goal of 6-24 lb at graduation from Pulmonary Rehab   Comment Pt has maintained his wt while in rehab. Wt loss  goal wt not achieved. Pt has been limiting food sources of sodium and now usually compares and chooses lower sodium food options.      Personal Goal #2 Re-Evaluation   Personal Goal #2 Identify and limit food sources of sodium      Psychosocial: Target Goals: Acknowledge presence or absence of significant depression and/or stress, maximize coping skills, provide positive support system. Participant is able to verbalize types and ability to use techniques and skills  needed for reducing stress and depression.  Initial Review & Psychosocial Screening:     Initial Psych Review & Screening - 04/25/17 1547      Initial Review   Current issues with None Identified   Source of Stress Concerns None Identified     Family Dynamics   Good Support System? Yes     Barriers   Psychosocial barriers to participate in program There are no identifiable barriers or psychosocial needs.      Quality of Life Scores:   PHQ-9: Recent Review Flowsheet Data    Depression screen [facility] 2/9 04/25/2017 03/04/2017 11/15/2016   Decreased Interest 0 0 0   Down, Depressed, Hopeless 0 0 0   PHQ - 2 Score 0 0 0     Interpretation of Total Score  Total Score Depression Severity:  1-4 = Minimal depression, 5-9 = Mild depression, 10-14 = Moderate depression, 15-19 = Moderately severe depression, 20-27 = Severe depression   Psychosocial Evaluation and Intervention:     Psychosocial Evaluation - 04/25/17 1548      Psychosocial Evaluation & Interventions   Interventions Encouraged to exercise with the program and follow exercise prescription   Continue Psychosocial Services  No Follow up required      Psychosocial Re-Evaluation:     Psychosocial Re-Evaluation    [place] Name 12/31/16 0830 01/30/17 1304 05/16/17 1134         Psychosocial Re-Evaluation   Current issues with Current Stress Concerns  - Current Sleep Concerns     Comments patient extremely concerned over his most recent diagnosis of hypertension patient more acceptance of hypertension diagnosis now that he sees his medication is working and his BP is controlled patient has a habit of waking at the same time every night and migrating to the kitchen and eating sweets. he has had behavior modification therapy in the past with success. he is incoraged to revisit need for therapy. this is keeping patient from getting a full nights rest and causing him to continue to gain weight.     Expected Outcomes through  education and seeing improvement of his hypertension with medications patient will express resolved concern for hypertension through education and seeing improvement of his hypertension with medications patient will express resolved concern for hypertension patient will verbalize behavoir modification stratagies to help with getting up and eating with nighttime awakening     Interventions Encouraged to attend Pulmonary Rehabilitation for the exercise Encouraged to attend Pulmonary Rehabilitation for the exercise Encouraged to attend Pulmonary Rehabilitation for the exercise     Continue Psychosocial Services  Follow up required by staff Follow up required by staff Follow up required by staff       Initial Review   Source of Stress Concerns Chronic Illness;Unable to participate in former interests or hobbies;Unable to perform yard/household activities Chronic Illness;Unable to participate in former interests or hobbies;Unable to perform yard/household activities None Identified        Psychosocial Discharge (Final Psychosocial Re-Evaluation):     Psychosocial Re-Evaluation -  05/16/17 1134      Psychosocial Re-Evaluation   Current issues with Current Sleep Concerns   Comments patient has a habit of waking at the same time every night and migrating to the kitchen and eating sweets. he has had behavior modification therapy in the past with success. he is incoraged to revisit need for therapy. this is keeping patient from getting a full nights rest and causing him to continue to gain weight.   Expected Outcomes patient will verbalize behavoir modification stratagies to help with getting up and eating with nighttime awakening   Interventions Encouraged to attend Pulmonary Rehabilitation for the exercise   Continue Psychosocial Services  Follow up required by staff     Initial Review   Source of Stress Concerns None Identified      Education: Education Goals: Education classes will be provided on  a weekly basis, covering required topics. Participant will state understanding/return demonstration of topics presented.  Learning Barriers/Preferences:   Education Topics: Risk Factor Reduction:  -Group instruction that is supported by a PowerPoint presentation. Instructor discusses the definition of a risk factor, different risk factors for pulmonary disease, and how the heart and lungs work together.     Nutrition for Pulmonary Patient:  -Group instruction provided by PowerPoint slides, verbal discussion, and written materials to support subject matter. The instructor gives an explanation and review of healthy diet recommendations, which includes a discussion on weight management, recommendations for fruit and vegetable consumption, as well as protein, fluid, caffeine, fiber, sodium, sugar, and alcohol. Tips for eating when patients are short of breath are discussed.   PULMONARY REHAB OTHER RESPIRATORY from 05/15/2017 in [place]  Date  02/13/17  Educator  RD  Instruction Review Code  2- meets goals/outcomes      Pursed Lip Breathing:  -Group instruction that is supported by demonstration and informational handouts. Instructor discusses the benefits of pursed lip and diaphragmatic breathing and detailed demonstration on how to preform both.     Oxygen Safety:  -Group instruction provided by PowerPoint, verbal discussion, and written material to support subject matter. There is an overview of "What is Oxygen" and "Why do we need it".  Instructor also reviews how to create a safe environment for oxygen use, the importance of using oxygen as prescribed, and the risks of noncompliance. There is a brief discussion on traveling with oxygen and resources the patient may utilize.   PULMONARY REHAB OTHER RESPIRATORY from 05/15/2017 in [place]  Date  02/06/17  Educator  Cormac Wint  Instruction Review Code  2- meets goals/outcomes       Oxygen Equipment:  -Group instruction provided by [employer] Staff utilizing handouts, written materials, and equipment demonstrations.   PULMONARY REHAB OTHER RESPIRATORY from 05/15/2017 in [place]  Date  01/16/17  Educator  [facility]  Instruction Review Code  2- meets goals/outcomes      Signs and Symptoms:  -Group instruction provided by written material and verbal discussion to support subject matter. Warning signs and symptoms of infection, stroke, and heart attack are reviewed and when to call the physician/911 reinforced. Tips for preventing the spread of infection discussed.   PULMONARY REHAB OTHER RESPIRATORY from 05/15/2017 in [place]  Date  05/15/17  Educator  rn  Instruction Review Code  2- meets goals/outcomes      Advanced Directives:  -Group instruction provided by verbal instruction and written material to  support subject matter. Instructor reviews Advanced Directive laws and proper instruction for filling out document.   Pulmonary Video:  -Group video education that reviews the importance of medication and oxygen compliance, exercise, good nutrition, pulmonary hygiene, and pursed lip and diaphragmatic breathing for the pulmonary patient.   PULMONARY REHAB OTHER RESPIRATORY from 05/15/2017 in [place]  Date  12/05/16  Instruction Review Code  2- meets goals/outcomes      Exercise for the Pulmonary Patient:  -Group instruction that is supported by a PowerPoint presentation. Instructor discusses benefits of exercise, core components of exercise, frequency, duration, and intensity of an exercise routine, importance of utilizing pulse oximetry during exercise, safety while exercising, and options of places to exercise outside of rehab.     PULMONARY REHAB OTHER RESPIRATORY from 05/15/2017 in [place]  Date  05/06/17  Educator   ep  Instruction Review Code  R- Review/reinforce      Pulmonary Medications:  -Verbally interactive group education provided by instructor with focus on inhaled medications and proper administration.   PULMONARY REHAB OTHER RESPIRATORY from 05/15/2017 in [place]  Date  01/09/17  Educator  Pharm  Instruction Review Code  2- meets goals/outcomes      Anatomy and Physiology of the Respiratory System and Intimacy:  -Group instruction provided by PowerPoint, verbal discussion, and written material to support subject matter. Instructor reviews respiratory cycle and anatomical components of the respiratory system and their functions. Instructor also reviews differences in obstructive and restrictive respiratory diseases with examples of each. Intimacy, Sex, and Sexuality differences are reviewed with a discussion on how relationships can change when diagnosed with pulmonary disease. Common sexual concerns are reviewed.   MD DAY -A group question and answer session with a medical doctor that allows participants to ask questions that relate to their pulmonary disease state.   PULMONARY REHAB OTHER RESPIRATORY from 05/15/2017 in [place]  Date  02/27/17  Educator  yacoub  Instruction Review Code  2- meets goals/outcomes      OTHER EDUCATION -Group or individual verbal, written, or video instructions that support the educational goals of the pulmonary rehab program.   Knowledge Questionnaire Score:     Knowledge Questionnaire Score - 04/30/17 0913      Knowledge Questionnaire Score   Pre Score 11/13      Core Components/Risk Factors/Patient Goals at Admission:     Personal Goals and Risk Factors at Admission - 04/25/17 1546      Core Components/Risk Factors/Patient Goals on Admission   Improve shortness of breath with ADL's Yes   Intervention Provide education, individualized exercise plan and daily activity  instruction to help decrease symptoms of SOB with activities of daily living.   Expected Outcomes Short Term: Achieves a reduction of symptoms when performing activities of daily living.   Hypertension Yes   Intervention Provide education on lifestyle modifcations including regular physical activity/exercise, weight management, moderate sodium restriction and increased consumption of fresh fruit, vegetables, and low fat dairy, alcohol moderation, and smoking cessation.;Monitor prescription use compliance.   Expected Outcomes Short Term: Continued assessment and intervention until BP is < 140/42m HG in hypertensive participants. < 130/868mHG in hypertensive participants with diabetes, heart failure or chronic kidney disease.;Long Term: Maintenance of blood pressure at goal levels.      Core Components/Risk Factors/Patient Goals Review:      Goals and Risk Factor Review  [place] Name 12/31/16 4034 01/30/17 1304 02/25/17 1551 05/16/17 1132       Core Components/Risk Factors/Patient Goals Review   Personal Goals Review Weight Management/Obesity;Improve shortness of breath with ADL's;Develop more efficient breathing techniques such as purse lipped breathing and diaphragmatic breathing and practicing self-pacing with activity.;Hypertension;Stress Weight Management/Obesity;Improve shortness of breath with ADL's;Develop more efficient breathing techniques such as purse lipped breathing and diaphragmatic breathing and practicing self-pacing with activity.;Hypertension;Stress Weight Management/Obesity;Improve shortness of breath with ADL's;Develop more efficient breathing techniques such as purse lipped breathing and diaphragmatic breathing and practicing self-pacing with activity.;Hypertension;Stress Develop more efficient breathing techniques such as purse lipped breathing and diaphragmatic breathing and practicing self-pacing with activity.;Hypertension;Stress;Weight Management/Obesity;Improve shortness of  breath with ADL's;Increase knowledge of respiratory medications and ability to use respiratory devices properly.    Review see comment section on ITP see comment section on ITP his weight remains at baseline. his wife states the patient is more aware of the "unhealthy" things he eats. he utilizes PLB and pacing however he sometimes feels he cant get enough air through his nose. I have worked with patient on taking in larger breaths through his mouth and blowing out through pursed lips. This technique makes him feel more satisfied with air consumption and decreases the anxiety he has when he feels he cant get enough air through his nose. he has learned to pace himself so that he can tolerate workload increases. his hypertension has resolved with medication. 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 he admits to not following through with his discharge home exercise plan and by being sedentary for the last several months patient has become deconditioned. It is too early in this admission to evaluate progress towards current program and personal goals.    Expected Outcomes see admission expected outcomes see admission expected outcomes see admission expected outcomes see admission expected outcomes       Core Components/Risk Factors/Patient Goals at Discharge (Final Review):      Goals and Risk Factor Review - 05/16/17 1132      Core Components/Risk Factors/Patient Goals Review   Personal Goals Review Develop more efficient breathing techniques such as purse lipped breathing and diaphragmatic breathing and practicing self-pacing with activity.;Hypertension;Stress;Weight Management/Obesity;Improve shortness of breath with ADL's;Increase knowledge of respiratory medications and ability to use respiratory devices properly.   Review this is patient second admission in pulmonary rehab. he admits to not following through with his  discharge home exercise plan and by being sedentary for the last several months patient has become deconditioned. It is too early in this admission to evaluate progress towards current program and personal goals.   Expected Outcomes see admission expected outcomes      ITP Comments:   Comments: ITP REVIEW It is too early in program to evaluate progress toward pulmonary rehab goals after completing 3 sessions. Recommend continued exercise, life style modification, education, and utilization of breathing techniques to increase stamina and strength and decrease shortness of breath with exertion.

## 2017-05-20 ENCOUNTER — Encounter (HOSPITAL_COMMUNITY)
Admission: RE | Admit: 2017-05-20 | Discharge: 2017-05-20 | Disposition: A | Discharge status: Home / Self Care | Payer: Medicare Other | Source: Ambulatory Visit | Attending: Pulmonary Disease | Admitting: Pulmonary Disease

## 2017-05-20 VITALS — Wt 210.5 lb

## 2017-05-20 DIAGNOSIS — J849 Interstitial pulmonary disease, unspecified: Secondary | ICD-10-CM

## 2017-05-20 NOTE — Progress Notes (Signed)
Daily Session Note  Patient Details  Name: Jeffrey Cobb MRN: [id number] Date of Birth: 12-28-1940 Referring Provider:     Pulmonary Rehab Walk Test from 04/29/2017 in [place]  Referring Provider  Dr. [name]      Encounter Date: 05/20/2017  Check In:     Session Check In - 05/20/17 1030      Check-In   Location [facility]   Staff Present Su Hilt, MS, ACSM RCEP, Exercise Physiologist;Lisa Ysidro Evert, RN;Juelz Claar Rollene Rotunda, RN, BSN   Supervising physician immediately available to respond to emergencies [facility] immediately available   Physician(s) Dr. Wynelle Cleveland   Medication changes reported     No   Fall or balance concerns reported    No   Tobacco Cessation No Change   Warm-up and Cool-down Performed as group-led instruction   Resistance Training Performed Yes   VAD Patient? No     Pain Assessment   Currently in Pain? No/denies   Multiple Pain Sites No      Capillary Blood Glucose: No results found for this or any previous visit (from the past 24 hour(s)).      Exercise Prescription Changes - 05/20/17 1229      Response to Exercise   Blood Pressure (Admit) 120/74   Blood Pressure (Exercise) 132/88   Blood Pressure (Exit) 104/72   Heart Rate (Admit) 81 bpm   Heart Rate (Exercise) 99 bpm   Heart Rate (Exit) 83 bpm   Oxygen Saturation (Admit) 90 %   Oxygen Saturation (Exercise) 86 %   Oxygen Saturation (Exit) 99 %   Rating of Perceived Exertion (Exercise) 11   Perceived Dyspnea (Exercise) 1   Duration Continue with 45 min of aerobic exercise without signs/symptoms of physical distress.   Intensity THRR unchanged     Progression   Progression Continue to progress workloads to maintain intensity without signs/symptoms of physical distress.     Resistance Training   Training Prescription Yes   Weight blue bands   Reps 10-15   Time 10 Minutes     Interval Training   Interval Training No     Oxygen    Oxygen Continuous   Liters 10     Treadmill   MPH 1.8   Grade 0   Minutes 17     Bike   Level 1   Minutes 17     NuStep   Level 5   Minutes 17   METs 2.4      History  Smoking Status  . Former Smoker  . Packs/day: 3.00  . Years: 17.00  . Types: Cigarettes  . Quit date: 08/26/1977  Smokeless Tobacco  . Never Used    Goals Met:  Independence with exercise equipment Using PLB without cueing & demonstrates good technique Exercise tolerated well No report of cardiac concerns or symptoms Strength training completed today  Goals Unmet:  O2 Sat  Comments: Service time is from 1030 to 1205   Dr. Rush Farmer is Medical Director for Pulmonary Rehab at [facility].

## 2017-05-22 ENCOUNTER — Encounter (HOSPITAL_COMMUNITY)
Admission: RE | Admit: 2017-05-22 | Discharge: 2017-05-22 | Disposition: A | Discharge status: Home / Self Care | Payer: Medicare Other | Source: Ambulatory Visit | Attending: Pulmonary Disease | Admitting: Pulmonary Disease

## 2017-05-22 VITALS — Wt 212.5 lb

## 2017-05-22 DIAGNOSIS — J849 Interstitial pulmonary disease, unspecified: Secondary | ICD-10-CM | POA: Diagnosis not present

## 2017-05-22 NOTE — Progress Notes (Signed)
Daily Session Note  Patient Details  Name: Jeffrey Cobb MRN: [id number] Date of Birth: [birth year] Referring Provider:     Pulmonary Rehab Walk Test from 04/29/2017 in [place]  Referring Provider  Dr. [name]      Encounter Date: 05/22/2017  Check In:     Session Check In - 05/22/17 1028      Check-In   [facility]   Staff Present Trish Fountain, RN, BSN;Kaymen Adrian, MS, ACSM RCEP, Exercise Physiologist;Lisa Ysidro Evert, RN   Supervising physician immediately available to respond to emergencies [facility] immediately available   Physician(s) Dr. Wendee Beavers   Medication changes reported     No   Fall or balance concerns reported    No   Tobacco Cessation No Change   Warm-up and Cool-down Performed as group-led instruction   Resistance Training Performed Yes   VAD Patient? No     Pain Assessment   Currently in Pain? No/denies   Multiple Pain Sites No      Capillary Blood Glucose: No results found for this or any previous visit (from the past 24 hour(s)).      Exercise Prescription Changes - 05/22/17 1200      Response to Exercise   Blood Pressure (Admit) 120/70   Blood Pressure (Exercise) 156/82   Blood Pressure (Exit) 114/70   Heart Rate (Admit) 89 bpm   Heart Rate (Exercise) 104 bpm   Heart Rate (Exit) 81 bpm   Oxygen Saturation (Admit) 91 %   Oxygen Saturation (Exercise) 87 %   Oxygen Saturation (Exit) 98 %   Rating of Perceived Exertion (Exercise) 13   Perceived Dyspnea (Exercise) 1   Duration Continue with 45 min of aerobic exercise without signs/symptoms of physical distress.   Intensity THRR unchanged     Progression   Progression Continue to progress workloads to maintain intensity without signs/symptoms of physical distress.     Resistance Training   Training Prescription Yes   Weight blue bands   Reps 10-15   Time 10 Minutes     Interval Training   Interval Training No     Oxygen    Oxygen Continuous   Liters 10     Treadmill   MPH 1.8   Grade 0   Minutes 17     NuStep   Level 5   Minutes 17   METs 2.3      History  Smoking Status  . Former Smoker  . Packs/day: 3.00  . Years: 17.00  . Types: Cigarettes  . Quit date: 08/26/1977  Smokeless Tobacco  . Never Used    Goals Met:  Exercise tolerated well No report of cardiac concerns or symptoms Strength training completed today  Goals Unmet:  Not Applicable  Comments: Service time is from 10:30a to 12:10p    Dr. Rush Farmer is Medical Director for Pulmonary Rehab at [facility].

## 2017-05-27 ENCOUNTER — Encounter (HOSPITAL_COMMUNITY)
Admission: RE | Admit: 2017-05-27 | Discharge: 2017-05-27 | Disposition: A | Discharge status: Home / Self Care | Payer: Medicare Other | Source: Ambulatory Visit | Attending: Pulmonary Disease | Admitting: Pulmonary Disease

## 2017-05-27 VITALS — Wt 211.4 lb

## 2017-05-27 DIAGNOSIS — J849 Interstitial pulmonary disease, unspecified: Secondary | ICD-10-CM | POA: Diagnosis not present

## 2017-05-27 NOTE — Progress Notes (Signed)
Daily Session Note  Patient Details  Name: Jeffrey Cobb MRN: [id number] Date of Birth: 10-25-1940 Referring Provider:     Pulmonary Rehab Walk Test from 04/29/2017 in [place]  Referring Provider  Dr. [name]      Encounter Date: 05/27/2017  Check In:     Session Check In - 05/27/17 1224      Check-In   [facility]   Staff Present Su Hilt, MS, ACSM RCEP, Exercise Physiologist;Tallen Schnorr Ysidro Evert, RN;Other   Supervising physician immediately available to respond to emergencies [facility] immediately available   Physician(s) Dr. Wendee Beavers   Medication changes reported     No   Fall or balance concerns reported    No   Tobacco Cessation No Change   Warm-up and Cool-down Performed as group-led instruction   Resistance Training Performed Yes   VAD Patient? No     Pain Assessment   Currently in Pain? No/denies   Multiple Pain Sites No      Capillary Blood Glucose: No results found for this or any previous visit (from the past 24 hour(s)).      Exercise Prescription Changes - 05/27/17 1200      Response to Exercise   Blood Pressure (Admit) 140/70   Blood Pressure (Exercise) 132/66   Blood Pressure (Exit) 118/68   Heart Rate (Admit) 88 bpm   Heart Rate (Exercise) 100 bpm   Heart Rate (Exit) 80 bpm   Oxygen Saturation (Admit) 98 %   Oxygen Saturation (Exercise) 88 %   Oxygen Saturation (Exit) 99 %   Rating of Perceived Exertion (Exercise) 11   Perceived Dyspnea (Exercise) 1   Duration Continue with 45 min of aerobic exercise without signs/symptoms of physical distress.   Intensity THRR unchanged     Progression   Progression Continue to progress workloads to maintain intensity without signs/symptoms of physical distress.     Resistance Training   Training Prescription Yes   Weight blue bands   Reps 10-15   Time 10 Minutes     Interval Training   Interval Training No     Oxygen   Oxygen  Continuous   Liters 10     Treadmill   MPH 1.8   Grade 0   Minutes 17     Bike   Level 1   Minutes 17     NuStep   Level 3  workload decreased not feeling well today   Minutes 17   METs 2.3      History  Smoking Status  . Former Smoker  . Packs/day: 3.00  . Years: 17.00  . Types: Cigarettes  . Quit date: 08/26/1977  Smokeless Tobacco  . Never Used    Goals Met:  No report of cardiac concerns or symptoms Strength training completed today  Goals Unmet:  Not Applicable  Comments: Service time is from 1030 to 1210    Dr. Rush Farmer is Medical Director for Pulmonary Rehab at [facility].

## 2017-05-29 ENCOUNTER — Encounter (HOSPITAL_COMMUNITY)
Admission: RE | Admit: 2017-05-29 | Discharge: 2017-05-29 | Disposition: A | Discharge status: Home / Self Care | Payer: Medicare Other | Source: Ambulatory Visit | Attending: Pulmonary Disease | Admitting: Pulmonary Disease

## 2017-05-29 DIAGNOSIS — J849 Interstitial pulmonary disease, unspecified: Secondary | ICD-10-CM

## 2017-06-03 ENCOUNTER — Encounter (HOSPITAL_COMMUNITY)
Admission: RE | Admit: 2017-06-03 | Discharge: 2017-06-03 | Disposition: A | Discharge status: Home / Self Care | Payer: Medicare Other | Source: Ambulatory Visit | Attending: Pulmonary Disease | Admitting: Pulmonary Disease

## 2017-06-03 VITALS — Wt 210.5 lb

## 2017-06-03 DIAGNOSIS — J849 Interstitial pulmonary disease, unspecified: Secondary | ICD-10-CM | POA: Diagnosis not present

## 2017-06-03 NOTE — Progress Notes (Signed)
Daily Session Note  Patient Details  Name: Jeffrey Cobb MRN: [id number] Date of Birth: 08/14/1941 Referring Provider:     Pulmonary Rehab Walk Test from 04/29/2017 in [facility]  Referring Provider  Dr. McQuaid      Encounter Date: 06/03/2017  Check In:     Session Check In - 06/03/17 1020      Check-In   [facility]   Staff Present Portia Payne, RN, BSN; , RN;Molly diVincenzo, MS, ACSM RCEP, Exercise Physiologist   Supervising physician immediately available to respond to emergencies [facility] immediately available   Physician(s) Dr. Matthews   Medication changes reported     No   Fall or balance concerns reported    No   Tobacco Cessation No Change   Warm-up and Cool-down Performed as group-led instruction   Resistance Training Performed Yes   VAD Patient? No     Pain Assessment   Currently in Pain? No/denies   Multiple Pain Sites No      Capillary Blood Glucose: No results found for this or any previous visit (from the past 24 hour(s)).      Exercise Prescription Changes - 06/03/17 1200      Response to Exercise   Blood Pressure (Admit) 146/80   Blood Pressure (Exercise) 118/70   Blood Pressure (Exit) 120/80   Heart Rate (Admit) 99 bpm   Heart Rate (Exercise) 112 bpm   Heart Rate (Exit) 91 bpm   Oxygen Saturation (Admit) 99 %   Oxygen Saturation (Exercise) 87 %   Oxygen Saturation (Exit) 98 %   Rating of Perceived Exertion (Exercise) 11   Perceived Dyspnea (Exercise) 1   Duration Continue with 45 min of aerobic exercise without signs/symptoms of physical distress.   Intensity THRR unchanged     Progression   Progression Continue to progress workloads to maintain intensity without signs/symptoms of physical distress.     Resistance Training   Training Prescription Yes   Weight blue bands   Reps 10-15   Time 10 Minutes     Interval Training   Interval Training No     Oxygen   Oxygen Continuous   Liters 10     Treadmill   MPH 1.8   Grade 0   Minutes 17     NuStep   Level 5   Minutes 17   METs 2.4      History  Smoking Status  . Former Smoker  . Packs/day: 3.00  . Years: 17.00  . Types: Cigarettes  . Quit date: 08/26/1977  Smokeless Tobacco  . Never Used    Goals Met:  Exercise tolerated well No report of cardiac concerns or symptoms Strength training completed today  Goals Unmet:  Not Applicable  Comments: Service time is from 1030 to 1230    Dr. Wesam G. Yacoub is Medical Director for Pulmonary Rehab at [facility]. 

## 2017-06-05 ENCOUNTER — Encounter (HOSPITAL_COMMUNITY): Admission: RE | Admit: 2017-06-05 | Payer: Medicare Other | Source: Ambulatory Visit

## 2017-06-05 NOTE — Progress Notes (Signed)
Jeffrey Cobb 76 y.o. male  DOB: 1940-09-19 MRN: [id number]           Nutrition Brief Note 1. ILD (interstitial lung disease) (HCC)    Past Medical History:  Diagnosis Date  . Acute sinusitis, unspecified   . Arthritis   . 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 GERD (gastroesophageal reflux disease)   . Left anterior fascicular block    hx of on ekg  . Mixed hyperlipidemia   . Obstructive chronic bronchitis with exacerbation (HCC)   . Occlusion and stenosis of carotid artery without mention of cerebral infarction    a. 05/2013 Carotid U/S: 1-39% bilat stenosis.  . Other emphysema (HCC)   . 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 Sleep apnea    does not use cpap due to cough  . Stroke [facility]) 05/2015   Meds reviewed. Prednisone noted  Ht: Ht Readings from Last 1 Encounters:  04/25/17 5' 5.5" (1.664 m)    Wt:  Wt Readings from Last 3 Encounters:  06/03/17 210 lb 8.6 oz (95.5 kg)  05/27/17 211 lb 6.7 oz (95.9 kg)  05/22/17 212 lb 8.4 oz (96.4 kg)    BMI: 34.8    Current tobacco use? No  Nutrition Diagnosis ? Food-and nutrition-related knowledge deficit related to lack of exposure to information as related to diagnosis of pulmonary disease ? 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 Identify food quantities necessary to achieve wt loss of  -2# per week to a goal wt loss of 2.7-10.9 kg (6-24 lb) at graduation from pulmonary rehab. 2. Identify and limit food sources of sodium Plan:  Pt to attend Pulmonary Nutrition class Will provide client-centered nutrition education as part of interdisciplinary care.   Monitor and evaluate progress toward nutrition goal with team.  Monitor and Evaluate progress toward nutrition goal with team.   Mickle Plumb, M.Ed, RD, LDN, CDE 06/05/2017 1:54 PM

## 2017-06-10 ENCOUNTER — Encounter (HOSPITAL_COMMUNITY): Admission: RE | Admit: 2017-06-10 | Payer: Medicare Other | Source: Ambulatory Visit

## 2017-06-10 ENCOUNTER — Ambulatory Visit (INDEPENDENT_AMBULATORY_CARE_PROVIDER_SITE_OTHER): Payer: Medicare Other | Admitting: *Deleted

## 2017-06-10 DIAGNOSIS — R0609 Other forms of dyspnea: Secondary | ICD-10-CM

## 2017-06-10 DIAGNOSIS — J84112 Idiopathic pulmonary fibrosis: Secondary | ICD-10-CM

## 2017-06-10 NOTE — Progress Notes (Signed)
SIX MIN WALK 06/10/2017 09/27/2016 04/23/2016  Medications amlodpine 2.5mg , aspirin , vit b12 and mirapex 0.5mg  were all taken this morning around 7am.  at 7am pt took asprin, vit B12, mobic, mirapex Mirapex,Mobic,ASA81mg , Mucinex  Supplimental Oxygen during Test? (L/min) Yes No No  O2 Flow Rate 4 - -  Type Pulse - -  Laps Partial Lap (in Meters) 24 4.5 0  Baseline BP (sitting) 116/72 128/86 122/74  Baseline Heartrate 93 82 83  Baseline Dyspnea (Borg Scale) Baseline Fatigue (Borg Scale) Baseline SPO2 96 92 95  BP (sitting) 128/84 146/90 160/82  Heartrate 104 92 91  Dyspnea (Borg Scale) Fatigue (Borg Scale) SPO2 90 82 92  BP (sitting) 128/84 132/90 124/76  Heartrate 94 85 85  SPO2 99 93 95  Stopped or Paused before Six Minutes Yes No No  Other Symptoms at end of Exercise Patient stopped with 2:50 left on timer. Stopped due to shortness of breath. Denied any chest pains or other body aches. O2 was at 77% HR:103. This portion of the walk was without O2. O2 was placed back on patient at 4L. O2 rose to 99%. HR: 98. Patient resumed walk after catching his breath.  - -  Distance Completed 216 292.5 336  Tech Comments: Patient walked at a sluggish pace. Prior to the walk, he stated that he was already having a bad breathing day. Complained of having a dry mouth and had to drink water during his laps. Stopped with 2:50 left as mentioned before. At the end of the walk, patient still complained of SOB.  pt stated that he was not having a good morning.  pt stated that this is his normal.  end of test spo2 was 82% and once pt sat down his oxygen level came up quickly to 89-90%.   Pt completed walk without any breaks;pt felt slight SOB with 3:00 left in the test

## 2017-06-10 NOTE — Progress Notes (Signed)
IPF PRO Registry Purpose: To collect data and biological samples that will support future research studies.  Registry will describe current approaches to diagnosis and treatment of IPF, analyze participant characteristics to describe the natural history of the disease, assess quality of life, describe participants interactions with the health care system, describe IPF treatment practices across multiple institutions, and utilize biological samples linked to well characterized IPF participants to identify disease biomarkers.   Clinical Research Coordinator / Research RN note : This visit for Subject Jeffrey Cobb with DOB: 1941-03-02 on 06/10/2017 for the above protocol is for a follow up visit for the purpose of research . The consent for this encounter is under currently IRB approved. Subject expressed continued interest and consent in continuing as a study subject. Subject confirmed that there was no change in contact information (e.g. address, telephone, email). Subject thanked for participation in research and contribution to science.   The CRC has confirmed that the note will be routed to PI by sub-I if possible   Signed by  Tresa Moore Clinical Research Coordinator / Nurse [facility]  [place], [place] 3:15 PM 06/10/2017

## 2017-06-11 ENCOUNTER — Ambulatory Visit (INDEPENDENT_AMBULATORY_CARE_PROVIDER_SITE_OTHER): Payer: Medicare Other | Admitting: Pulmonary Disease

## 2017-06-11 ENCOUNTER — Encounter: Payer: Self-pay | Admitting: Pulmonary Disease

## 2017-06-11 ENCOUNTER — Ambulatory Visit (INDEPENDENT_AMBULATORY_CARE_PROVIDER_SITE_OTHER)
Admission: RE | Admit: 2017-06-11 | Discharge: 2017-06-11 | Disposition: A | Discharge status: Home / Self Care | Payer: Medicare Other | Source: Ambulatory Visit | Attending: Pulmonary Disease | Admitting: Pulmonary Disease

## 2017-06-11 ENCOUNTER — Other Ambulatory Visit (INDEPENDENT_AMBULATORY_CARE_PROVIDER_SITE_OTHER): Payer: Medicare Other

## 2017-06-11 VITALS — BP 136/72 | HR 88 | Ht 71.0 in | Wt 211.0 lb

## 2017-06-11 DIAGNOSIS — J84112 Idiopathic pulmonary fibrosis: Secondary | ICD-10-CM

## 2017-06-11 DIAGNOSIS — R0600 Dyspnea, unspecified: Secondary | ICD-10-CM | POA: Diagnosis not present

## 2017-06-11 DIAGNOSIS — J9611 Chronic respiratory failure with hypoxia: Secondary | ICD-10-CM

## 2017-06-11 DIAGNOSIS — Z5181 Encounter for therapeutic drug level monitoring: Secondary | ICD-10-CM | POA: Diagnosis not present

## 2017-06-11 LAB — PULMONARY FUNCTION TEST
DL/VA % PRED: 39 %
DL/VA: 1.75 ml/min/mmHg/L
DLCO cor % pred: 23 %
DLCO cor: 6.74 ml/min/mmHg
DLCO unc % pred: 24 %
DLCO unc: 7.07 ml/min/mmHg
FEF 25-75 Post: 2.43 L/sec
FEF 25-75 Pre: 2.21 L/sec
FEF2575-%CHANGE-POST: 9 %
FEF2575-%Pred-Post: 130 %
FEF2575-%Pred-Pre: 118 %
FEV1-%CHANGE-POST: 2 %
FEV1-%PRED-PRE: 80 %
FEV1-%Pred-Post: 82 %
FEV1-PRE: 2.12 L
FEV1-Post: 2.17 L
FEV1FVC-%CHANGE-POST: 4 %
FEV1FVC-%Pred-Pre: 111 %
FEV6-%CHANGE-POST: -1 %
FEV6-%PRED-PRE: 76 %
FEV6-%Pred-Post: 75 %
FEV6-PRE: 2.61 L
FEV6-Post: 2.59 L
FEV6FVC-%Change-Post: 0 %
FEV6FVC-%PRED-PRE: 106 %
FEV6FVC-%Pred-Post: 106 %
FVC-%CHANGE-POST: -1 %
FVC-%PRED-POST: 70 %
FVC-%PRED-PRE: 71 %
FVC-POST: 2.6 L
FVC-PRE: 2.64 L
POST FEV6/FVC RATIO: 99 %
PRE FEV6/FVC RATIO: 99 %
Post FEV1/FVC ratio: 84 %
Pre FEV1/FVC ratio: 80 %
RV % PRED: 54 %
RV: 1.32 L
TLC % pred: 61 %
TLC: 3.97 L

## 2017-06-11 LAB — BASIC METABOLIC PANEL
BUN: 12 mg/dL (ref 6–23)
CHLORIDE: 98 meq/L (ref 96–112)
CO2: 26 meq/L (ref 19–32)
CREATININE: 1.02 mg/dL (ref 0.40–1.50)
Calcium: 9.2 mg/dL (ref 8.4–10.5)
GFR: 75.33 mL/min (ref >60.00)
GLUCOSE: 106 mg/dL — AB (ref 70–99)
POTASSIUM: 4 meq/L (ref 3.5–5.1)
Sodium: 133 mEq/L — ABNORMAL LOW (ref 135–145)

## 2017-06-11 LAB — HEPATIC FUNCTION PANEL
ALBUMIN: 3.7 g/dL (ref 3.5–5.2)
ALT: 15 U/L (ref 0–53)
AST: 23 U/L (ref 0–37)
Alkaline Phosphatase: 66 U/L (ref 39–117)
Bilirubin, Direct: 0.2 mg/dL (ref 0.0–0.3)
TOTAL PROTEIN: 7.2 g/dL (ref 6.0–8.3)
Total Bilirubin: 0.6 mg/dL (ref 0.2–1.2)

## 2017-06-11 LAB — BRAIN NATRIURETIC PEPTIDE: Pro B Natriuretic peptide (BNP): 21 pg/mL (ref 0.0–100.0)

## 2017-06-11 MED ORDER — DOXYCYCLINE HYCLATE 100 MG PO TABS: 100.0000 mg | ORAL_TABLET | Freq: Two times a day (BID) | ORAL | 0 refills | Status: DC

## 2017-06-11 MED ORDER — HYDROCOD POLST-CPM POLST ER 10-8 MG/5ML PO SUER: 5.0000 mL | Freq: Two times a day (BID) | ORAL | 0 refills | Status: DC | PRN

## 2017-06-11 MED ORDER — FUROSEMIDE 40 MG PO TABS: 40.0000 mg | ORAL_TABLET | Freq: Every day | ORAL | 0 refills | Status: DC

## 2017-06-11 NOTE — Patient Instructions (Signed)
PFT done today. 

## 2017-06-11 NOTE — Patient Instructions (Signed)
For leg swelling: Echocardiogram We will check liver function testing, kidney function testing, and a BNP Take furosemide one pill daily for the next 5 days  For chronic respiratory failure: We will have you get oxygen bled in through your CPAP 3LPM at night We will get you a new concentrator at home that goes to 10 LPM to use on the treadmill  For IPF: Check liver function test to make sure there is no toxicity from Ofev Continue Ofev  For cough with mucus production: Chest x-ray today Take doxycycline twice a day for 5 days  We will see you back in 1 week or sooner if needed

## 2017-06-11 NOTE — Progress Notes (Signed)
Subjective:    Patient ID: Jeffrey Cobb, male    DOB: [birth year], 76 y.o.   MRN: [id number]  Synopsis: Former patient of Dr. 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 Follow-up    review 6mw and pft.  pt states he is having trouble staying asleep.  pt also notes dyspnea is worsening.     Leg swelling: > has been more of a problem for him for the last several weeks > they noticed some back in April with some ankle swelling > this will wax and wane, but lately has been constant > this was worse recently when he had been taking prednisone due ot some arthritis in his left food  Cough:  > has been worsening, severe > lots of chest congestion, worsening cough > has chest pain from straining his chest > no fever, no chills > family says he is producing more mucus lately, but he says no > mucus is typically white, sometimes pale green or yellow  Oxygen: > 3pulse when out of the house > his home concentrator only goes to 5LPM  Past Medical History:  Diagnosis Date  . Acute sinusitis, unspecified   . Arthritis   . 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 GERD (gastroesophageal reflux disease)   . Left anterior fascicular block    hx of on ekg  . Mixed hyperlipidemia   . Obstructive chronic bronchitis with exacerbation (HCC)   . Occlusion and stenosis of carotid artery without mention of cerebral infarction    a. 05/2013 Carotid U/S: 1-39% bilat stenosis.  . Other emphysema (HCC)   . 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 Sleep apnea    does not use cpap due to cough  . Stroke [place]) 05/2015      Review of Systems  Constitutional: Negative for chills, fatigue and fever.  HENT: Positive for  rhinorrhea. Negative for sinus pressure and sneezing.   Respiratory: Positive for cough. Negative for shortness of breath and wheezing.   Cardiovascular: Negative for chest pain, palpitations and leg swelling.       Objective:   Physical Exam Vitals:   06/11/17 0952  BP: 136/72  Pulse: 88  SpO2: 94%  Weight: 211 lb (95.7 kg)  Height: 5\' 11"  (1.803 m)   5L pm pulse  Gen: chronically ill appearing HENT: OP clear, TM's clear, neck supple PULM: Crackles bases B, normal percussion CV: RRR, no mgr, trace edema GI: BS+, soft, nontender Derm: no cyanosis or rash Psyche: normal mood and affect      CBC    Component Value Date/Time   WBC 6.7 05/31/2015 1730   RBC 4.64 05/31/2015 1730   HGB 14.6 05/31/2015 1730   HCT 41.5 05/31/2015 1730   PLT 138 (L) 05/31/2015 1730   MCV 89.4 05/31/2015 1730   MCH 31.5 05/31/2015 1730   MCHC 35.2 05/31/2015 1730   RDW 13.4 05/31/2015 1730   LYMPHSABS 2.2 09/02/2014 1204   MONOABS 0.7 09/02/2014 1204   EOSABS 0.2 09/02/2014 1204   BASOSABS 0.0 09/02/2014 1204     PFT PFT"s 2009:  FEV1 3.49 (115%), ratio 65, DLCO 67%. PFT's 08/2014:  FEV1 3.13 (114%), ratio 69,  decreased airtrapping from 2009, DLCO 49% June 2017 pulmonary function testing FEV1 2.99 L (111% predicted, FVC 3.67 L (98% predicted), total lung capacity 5.53 L (85% predicted), DLCO 12.76 (45% predicted). March 2018 pulmonary function testing ratio 80%, FVC 2.94 L 79% predicted, total lung capacity 4.51 L 69% predicted, DLCO 9.05 31% predicted 05/2017 PFT> FVC 2.6L, DLCO 7.1 24% pred  Imaging: 01/2016 HRCT > traction bronchiolectasis and interlobular septal thickening in a basilar predominance have progressed compared to July 2016, there remains no frank honeycombing. There are multiple pulmonary nodules which are stable or have resolved. February 2018 high-resolution CT chest independently reviewed showing traction bronchiectasis, interlobular septal thickening and honeycombing  worse in the periphery and bases.  6 minute walk test: August 2016 6 minute walk test 336 m O2 sats ration 92% on room air 04/27/2016 6 minute walk distance 292.5 m O2 sats ration and completion of test was 82% on room air. July 2018 6 minute walk: Distance 1231 feet, O2 saturation 88% on 10 L nasal cannula October 2018 distance 216 m  Other testing: September 2017 pH probe showed normal pH and no clear evidence of gastroesophageal reflux.  CPAP compliance report 02/2017> only using 39% of nights  Records from his visit with pulmonary rehabilitation reviewed, he's requiring up to 10 L continuous while working out on a treadmill.     Assessment & Plan:  Dyspnea, unspecified type - Plan: ECHOCARDIOGRAM COMPLETE, B Nat Peptide, Basic Metabolic Panel (BMET), DG Chest 2 View  IPF (idiopathic pulmonary fibrosis) (HCC) - Plan: Ambulatory Referral for DME  Chronic respiratory failure with hypoxia (HCC) - Plan: Ambulatory Referral for DME, Ambulatory Referral for DME  Therapeutic drug monitoring - Plan: Hepatic function panel  Discussion: Anthem continues to worsen.  Specifically, his oxygen needs have increased over the last several months, his PFTs have declined, his 6 min walk is worse and his oxygen needs are worsening.   He may have acute bronchitis given the recent cough, will give doxycycline and a cough medicine. We really need to get him a better concentrator to use at home for exercise, his DME company did not respond to our last request about this so we will try again today or change companies.  I counseled him today that things are worsening, I worry about him developing pulmonary hypertension.    Plan: For leg swelling: Echocardiogram We will check liver function testing, kidney function testing, and a BNP Take furosemide one pill daily for the next 5 days  For chronic respiratory failure: We will have you get oxygen bled in through your CPAP 3LPM at night We will get you a  new concentrator at home that goes to 10 LPM to use on the treadmill  For IPF: Check liver function test to make sure there is no toxicity from Ofev Continue Ofev  For cough with mucus production: Chest x-ray today Take doxycycline twice a day for 5 days  We will see you back in 1 week or sooner if needed    Current Outpatient Prescriptions:  .  albuterol (PROVENTIL) (2.5 MG/3ML) 0.083% nebulizer solution, Take 3 mLs (2.5 mg total) by nebulization every 6 (six) hours as needed for wheezing or shortness of breath., Disp: 360 mL, Rfl: 11 .  amLODipine (NORVASC) 2.5 MG tablet, Take 2.5 mg by mouth daily., Disp: , Rfl:  .  aspirin 81 MG tablet, Take 81 mg by mouth daily., Disp: , Rfl:  .  atorvastatin (LIPITOR) 40 MG tablet, Take 1 tablet (  40 mg total) by mouth daily at 6 PM., Disp: 30 tablet, Rfl: 0 .  cyanocobalamin 500 MCG tablet, Take 1,000 mcg by mouth daily., Disp: , Rfl:  .  DULoxetine (CYMBALTA) 20 MG capsule, Take 20 mg by mouth daily., Disp: , Rfl:  .  Nintedanib (OFEV) 150 MG CAPS, Take 150 mg by mouth 2 (two) times daily., Disp: 60 capsule, Rfl:  .  oxymetazoline (AFRIN) 0.05 % nasal spray, Place 1 spray into both nostrils as needed for congestion., Disp: , Rfl:  .  pramipexole (MIRAPEX) 0.5 MG tablet, TAKE 1 TABLET BY MOUTH EVERY MORNING AND TAKE 1 TABLET EVERY EVENING, Disp: 180 tablet, Rfl: 3 .  PROAIR HFA 108 (90 BASE) MCG/ACT inhaler, Inhale 2 puffs into the lungs every 6 (six) hours as needed., Disp: , Rfl:  .  QUEtiapine (SEROQUEL) 100 MG tablet, , Disp: , Rfl:  .  triamcinolone cream (KENALOG) 0.1 %, APPLY 1 APPLICATION ON THE SKIN TWICE A DAY AS NEEDED, Disp: , Rfl: 2

## 2017-06-12 ENCOUNTER — Ambulatory Visit (HOSPITAL_COMMUNITY): Payer: Medicare Other | Attending: Cardiovascular Disease

## 2017-06-12 ENCOUNTER — Encounter (HOSPITAL_COMMUNITY): Admission: RE | Admit: 2017-06-12 | Payer: Medicare Other | Source: Ambulatory Visit

## 2017-06-12 ENCOUNTER — Other Ambulatory Visit: Payer: Self-pay

## 2017-06-12 DIAGNOSIS — Z87891 Personal history of nicotine dependence: Secondary | ICD-10-CM | POA: Diagnosis not present

## 2017-06-12 DIAGNOSIS — G4733 Obstructive sleep apnea (adult) (pediatric): Secondary | ICD-10-CM | POA: Insufficient documentation

## 2017-06-12 DIAGNOSIS — E785 Hyperlipidemia, unspecified: Secondary | ICD-10-CM | POA: Insufficient documentation

## 2017-06-12 DIAGNOSIS — I082 Rheumatic disorders of both aortic and tricuspid valves: Secondary | ICD-10-CM | POA: Diagnosis not present

## 2017-06-12 DIAGNOSIS — R0600 Dyspnea, unspecified: Secondary | ICD-10-CM | POA: Diagnosis present

## 2017-06-12 DIAGNOSIS — Z8673 Personal history of transient ischemic attack (TIA), and cerebral infarction without residual deficits: Secondary | ICD-10-CM | POA: Insufficient documentation

## 2017-06-12 LAB — ECHOCARDIOGRAM COMPLETE
AOASC: 36 cm
CHL CUP RV SYS PRESS: 38 mmHg
E decel time: 271 msec
E/e' ratio: 12.6
FS: 34 % (ref 28–44)
IVS/LV PW RATIO, ED: 0.92
LA diam end sys: 26 mm
LA vol A4C: 43.3 ml
LA vol index: 25.1 mL/m2
LADIAMINDEX: 1.2 cm/m2
LASIZE: 26 mm
LAVOL: 54.3 mL
LV E/e' medial: 12.6
LVEEAVG: 12.6
LVELAT: 5.43 cm/s
LVOT VTI: 21.4 cm
LVOT area: 3.14 cm2
LVOT diameter: 20 mm
LVOT peak vel: 100 cm/s
LVOTSV: 67 mL
MV Dec: 271
MV pk A vel: 73.7 m/s
MVPKEVEL: 68.4 m/s
P 1/2 time: 795 ms
PW: 8.66 mm — AB (ref 0.6–1.1)
RV LATERAL S' VELOCITY: 8.67 cm/s
Reg peak vel: 276 cm/s
TAPSE: 11.2 mm
TDI e' lateral: 5.43
TDI e' medial: 5.7
TRMAXVEL: 276 cm/s

## 2017-06-13 ENCOUNTER — Telehealth: Payer: Self-pay | Admitting: Pulmonary Disease

## 2017-06-13 NOTE — Telephone Encounter (Signed)
Order indicated to send to [facility].  I sent it to [facility] on 10/17 at 10:23.  Gilda responded back to me 3 hours later and said this is an [facility] pt & she cc'd Ethelle LyonKim Sawyer at [facility] so she would see the order.  I did not get a response back from Selena BattenKim acknowledging that she had the order so today at 11:54 I sent Selena BattenKim & Larita FifeLynn the order again at [facility] & asked if they got it from 10/17 - still no response back.  I just called [facility] & spoke to Candy KitchenSusan.  I asked for Ethelle LyonKim Sawyer and was told by Darl PikesSusan that she is on a conference call.  Darl PikesSusan states she does not have the order.  I explained that it has been sent twice.  She asked me to fax it to her & she stated she will call the pt's wife. I printed the order and faxed it to RobinsonSusan.  I have called pt's wife & made her aware that someone from [facility] will be calling her.  Nothing further needed from us.

## 2017-06-13 NOTE — Telephone Encounter (Signed)
PCC's please advise what we need to do about this appt.  I thought [facility] and [facility] are one in the same?

## 2017-06-16 NOTE — Progress Notes (Signed)
Pulmonary Individual Treatment Plan  Patient Details  Name: Jeffrey Cobb MRN: [id number] Date of Birth: 1941/05/27 Referring Provider:     Pulmonary Rehab Walk Test from 04/29/2017 in [place]  Referring Provider  Dr. [name]      Initial Encounter Date:    Pulmonary Rehab Walk Test from 04/29/2017 in [place]  Date  04/29/17  Referring Provider  Dr. [name]      Visit Diagnosis: ILD (interstitial lung disease) ([place])  Patient's Home Medications on Admission:   Current Outpatient Prescriptions:  .  albuterol (PROVENTIL) (2.5 MG/3ML) 0.083% nebulizer solution, Take 3 mLs (2.5 mg total) by nebulization every 6 (six) hours as needed for wheezing or shortness of breath., Disp: 360 mL, Rfl: 11 .  amLODipine (NORVASC) 2.5 MG tablet, Take 2.5 mg by mouth daily., Disp: , Rfl:  .  aspirin 81 MG tablet, Take 81 mg by mouth daily., Disp: , Rfl:  .  atorvastatin (LIPITOR) 40 MG tablet, Take 1 tablet (40 mg total) by mouth daily at 6 PM., Disp: 30 tablet, Rfl: 0 .  chlorpheniramine-HYDROcodone (TUSSIONEX PENNKINETIC ER) 10-8 MG/5ML SUER, Take 5 mLs by mouth every 12 (twelve) hours as needed for cough., Disp: 140 mL, Rfl: 0 .  cyanocobalamin 500 MCG tablet, Take 1,000 mcg by mouth daily., Disp: , Rfl:  .  doxycycline (VIBRA-TABS) 100 MG tablet, Take 1 tablet (100 mg total) by mouth 2 (two) times daily., Disp: 10 tablet, Rfl: 0 .  DULoxetine (CYMBALTA) 20 MG capsule, Take 20 mg by mouth daily., Disp: , Rfl:  .  furosemide (LASIX) 40 MG tablet, Take 1 tablet (40 mg total) by mouth daily., Disp: 5 tablet, Rfl: 0 .  Nintedanib (OFEV) 150 MG CAPS, Take 150 mg by mouth 2 (two) times daily., Disp: 60 capsule, Rfl:  .  oxymetazoline (AFRIN) 0.05 % nasal spray, Place 1 spray into both nostrils as needed for congestion., Disp: , Rfl:  .  pramipexole (MIRAPEX) 0.5 MG tablet, TAKE 1 TABLET BY MOUTH EVERY MORNING AND TAKE 1 TABLET EVERY  EVENING, Disp: 180 tablet, Rfl: 3 .  PROAIR HFA 108 (90 BASE) MCG/ACT inhaler, Inhale 2 puffs into the lungs every 6 (six) hours as needed., Disp: , Rfl:  .  QUEtiapine (SEROQUEL) 100 MG tablet, , Disp: , Rfl:  .  triamcinolone cream (KENALOG) 0.1 %, APPLY 1 APPLICATION ON THE SKIN TWICE A DAY AS NEEDED, Disp: , Rfl: 2  Past Medical History: Past Medical History:  Diagnosis Date  . Acute sinusitis, unspecified   . Arthritis   . 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 GERD (gastroesophageal reflux disease)   . Left anterior fascicular block    hx of on ekg  . Mixed hyperlipidemia   . Obstructive chronic bronchitis with exacerbation ([place])   . Occlusion and stenosis of carotid artery without mention of cerebral infarction    a. 05/2013 Carotid U/S: 1-39% bilat stenosis.  . Other emphysema ([place])   . 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 Sleep apnea    does not use cpap due to cough  . Stroke ([place]) 05/2015    Tobacco Use: History  Smoking Status  . Former Smoker  . Packs/day: 3.00  . Years: 17.00  . Types: Cigarettes  . Quit date: 08/26/1977  Smokeless Tobacco  . Never Used    Labs: Recent Review Flowsheet Data  Labs for ITP Cardiac and Pulmonary Rehab Latest Ref Rng & Units 10/27/2007 11/01/2008 01/17/2011 06/01/2015   Cholestrol 0 - 200 mg/dL 175 169 165 170   LDLCALC 0 - 99 mg/dL 108(H) 105(H) 90 103(H)   HDL >40 mg/dL 57.8 52.5 67.40 58   Trlycerides <150 mg/dL 44 59 40.0 46   Hemoglobin A1c 4.8 - 5.6 % - - - 5.7(H)      Capillary Blood Glucose: No results found for: GLUCAP   Pulmonary Assessment Scores:     Pulmonary Assessment Scores    Row Name 01/07/17 1525 02/27/17 1537 04/30/17 0913     ADL UCSD   ADL Phase  - Exit Entry   SOB Score total  - 35 34     CAT Score   CAT Score 26  Pre 15  Exit 27  Entry      Pulmonary Function Assessment:     Pulmonary Function Assessment -  04/25/17 1543      Breath   Bilateral Breath Sounds --  clear upper fine crackles lower   Shortness of Breath Yes;Limiting activity      Exercise Target Goals:    Exercise Program Goal: Individual exercise prescription set with THRR, safety & activity barriers. Participant demonstrates ability to understand and report RPE using BORG scale, to self-measure pulse accurately, and to acknowledge the importance of the exercise prescription.  Exercise Prescription Goal: Starting with aerobic activity 30 plus minutes a day, 3 days per week for initial exercise prescription. Provide home exercise prescription and guidelines that participant acknowledges understanding prior to discharge.  Activity Barriers & Risk Stratification:     Activity Barriers & Cardiac Risk Stratification - 04/25/17 1512      Activity Barriers & Cardiac Risk Stratification   Activity Barriers Joint Problems;Arthritis;Back Problems;Deconditioning;Shortness of Breath;Left Knee Replacement;Right Knee Replacement;Neck/Spine Problems      6 Minute Walk:     6 Minute Walk    Row Name 03/04/17 1228 04/29/17 1619       6 Minute Walk   Phase Discharge Discharge    Distance 1231 feet 1300 feet    Distance % Change 0.08 %  -    Walk Time 6 minutes 6 minutes    # of Rest Breaks 0 0    MPH 2.33 2.46    METS 2.76 2.84    RPE 11 11    Perceived Dyspnea  1 1    Symptoms No Yes (comment)    Comments  - USED WHEELCHAIR    Resting HR 98 bpm 101 bpm    Resting BP 122/70 150/92    Resting Oxygen Saturation   - 95 %    Exercise Oxygen Saturation  during 6 min walk  - 87 %    Max Ex. HR 160 bpm 118 bpm    Max Ex. BP 138/78 155/92    2 Minute Post BP 112/76  -      Interval HR   Baseline HR (retired) 98  -    1 Minute HR 108 101    2 Minute HR 112 104    3 Minute HR 117 118    4 Minute HR 123  -    5 Minute HR 153 115    6 Minute HR 160 112    2 Minute Post HR 108 111    Interval Heart Rate? Yes Yes       Interval Oxygen   Interval Oxygen? Yes Yes    Baseline  Oxygen Saturation % 98 % 95 %    Resting Liters of Oxygen 8 L  -    1 Minute Oxygen Saturation % 92 % 93 %    1 Minute Liters of Oxygen 8 L 6 L    2 Minute Oxygen Saturation % 87 % 87 %    2 Minute Liters of Oxygen 8 L 6 L    3 Minute Oxygen Saturation % 86 % 86 %    3 Minute Liters of Oxygen 8 L 8 L    4 Minute Oxygen Saturation % 86 % 87 %    4 Minute Liters of Oxygen 10 L 8 L    5 Minute Oxygen Saturation % 88 % 87 %    5 Minute Liters of Oxygen 10 L 8 L    6 Minute Oxygen Saturation % 88 % 87 %    6 Minute Liters of Oxygen 10 L 8 L    2 Minute Post Oxygen Saturation % 93 % 90 %    2 Minute Post Liters of Oxygen 10 L 8 L       Oxygen Initial Assessment:     Oxygen Initial Assessment - 04/29/17 1629      Initial 6 min Walk   Oxygen Used Continuous;E-Tanks   Liters per minute 8     Program Oxygen Prescription   Program Oxygen Prescription Continuous;E-Tanks   Liters per minute 8      Oxygen Re-Evaluation:     Oxygen Re-Evaluation    Row Name 12/31/16 0827 01/30/17 1301 02/25/17 1549 05/16/17 1127 06/13/17 1115     Program Oxygen Prescription   Program Oxygen Prescription Continuous Continuous Continuous Continuous;E-Tanks Continuous;E-Tanks   Liters per minute 6 6 6 10 10    Comments  -  - he has had an increase to 8 lpm when walking the track to maintain an oxygen saturation >88-90% he has had to increase to 10 lpm while walking on the treadmill to maintain oxygen saturations greater than 88  -     Home Oxygen   Home Oxygen Device Home Concentrator;E-Tanks Home Concentrator;E-Tanks Home Concentrator;E-Tanks Home Concentrator;E-Tanks;Portable Concentrator Home Concentrator;E-Tanks;Portable Concentrator   Sleep Oxygen Prescription None None None CPAP  currently working physician to identify possible need for overnight oximetry to determine need for nighttime oxygen bleedin to cpap CPAP   Home Exercise Oxygen  Prescription Continuous Continuous Continuous Continuous  working to get new home concentrator that will accomodate 10lpm continuous flow Continuous  order placed this week for DME to replace patients home concentrator with one that will accomodate 10lpm   Liters per minute 4 4 4 10   while walking on his treadmill 10   Home at Rest Exercise Oxygen Prescription None None None None Continuous   Liters per minute  -  -  -  - 2   Compliance with Home Oxygen Use Yes Yes Yes Yes Yes   Comments -  patient now compliant with home oxygen prescription  -  -  -  -     Goals/Expected Outcomes   Short Term Goals To learn and exhibit compliance with exercise, home and travel O2 prescription;To learn and understand importance of monitoring SPO2 with pulse oximeter and demonstrate accurate use of the pulse oximeter.;To Learn and understand importance of maintaining oxygen saturations>88%;To learn and demonstrate proper purse lipped breathing techniques or other breathing techniques.;To learn and demonstrate proper use of respiratory medications To learn and exhibit compliance with exercise, home and travel O2 prescription;To  learn and understand importance of monitoring SPO2 with pulse oximeter and demonstrate accurate use of the pulse oximeter.;To Learn and understand importance of maintaining oxygen saturations>88%;To learn and demonstrate proper purse lipped breathing techniques or other breathing techniques.;To learn and demonstrate proper use of respiratory medications To learn and exhibit compliance with exercise, home and travel O2 prescription;To learn and understand importance of monitoring SPO2 with pulse oximeter and demonstrate accurate use of the pulse oximeter.;To Learn and understand importance of maintaining oxygen saturations>88%;To learn and demonstrate proper purse lipped breathing techniques or other breathing techniques.;To learn and demonstrate proper use of respiratory medications To learn  and exhibit compliance with exercise, home and travel O2 prescription;To learn and understand importance of monitoring SPO2 with pulse oximeter and demonstrate accurate use of the pulse oximeter.;To learn and understand importance of maintaining oxygen saturations>88%;To learn and demonstrate proper pursed lip breathing techniques or other breathing techniques. To learn and exhibit compliance with exercise, home and travel O2 prescription;To learn and understand importance of monitoring SPO2 with pulse oximeter and demonstrate accurate use of the pulse oximeter.;To learn and understand importance of maintaining oxygen saturations>88%;To learn and demonstrate proper pursed lip breathing techniques or other breathing techniques.   Long  Term Goals Exhibits compliance with exercise, home and travel O2 prescription;Maintenance of O2 saturations>88%;Compliance with respiratory medication Exhibits compliance with exercise, home and travel O2 prescription;Maintenance of O2 saturations>88%;Compliance with respiratory medication Exhibits compliance with exercise, home and travel O2 prescription;Maintenance of O2 saturations>88%;Compliance with respiratory medication Exhibits compliance with exercise, home and travel O2 prescription;Maintenance of O2 saturations>88%;Compliance with respiratory medication;Verbalizes importance of monitoring SPO2 with pulse oximeter and return demonstration;Exhibits proper breathing techniques, such as pursed lip breathing or other method taught during program session Exhibits compliance with exercise, home and travel O2 prescription;Maintenance of O2 saturations>88%;Compliance with respiratory medication;Verbalizes importance of monitoring SPO2 with pulse oximeter and return demonstration;Exhibits proper breathing techniques, such as pursed lip breathing or other method taught during program session   Comments  - patient verbalizing compliance with home O2. Patient observed wearing  his home o2 into rehab. patient verbalizing compliance with home O2. Patient observed wearing his home o2 into rehab. he is worried about his increased need for more oxygen as his workloads increase. patient verbalizes compliance with home o2 however barriers to correct use have been identified. Patient does not have adequate oxygen systems at home to maintain oxygen during sleep and with exercise barriers have been addressed this week by pulmonologist and orders are in place for new high flow home concentrator and o2 bleed in with CPAP   Goals/Expected Outcomes  -  -  - patient will have adequate home oxygen delivery systems to accomodate life needs. patient will have adequate home oxygen delivery systems to accomodate life needs.      Oxygen Discharge (Final Oxygen Re-Evaluation):     Oxygen Re-Evaluation - 06/13/17 1115      Program Oxygen Prescription   Program Oxygen Prescription Continuous;E-Tanks   Liters per minute 10     Home Oxygen   Home Oxygen Device Home Concentrator;E-Tanks;Portable Concentrator   Sleep Oxygen Prescription CPAP   Home Exercise Oxygen Prescription Continuous  order placed this week for DME to replace patients home concentrator with one that will accomodate 10lpm   Liters per minute 10   Home at Rest Exercise Oxygen Prescription Continuous   Liters per minute 2   Compliance with Home Oxygen Use Yes     Goals/Expected Outcomes   Short Term Goals To learn  and exhibit compliance with exercise, home and travel O2 prescription;To learn and understand importance of monitoring SPO2 with pulse oximeter and demonstrate accurate use of the pulse oximeter.;To learn and understand importance of maintaining oxygen saturations>88%;To learn and demonstrate proper pursed lip breathing techniques or other breathing techniques.   Long  Term Goals Exhibits compliance with exercise, home and travel O2 prescription;Maintenance of O2 saturations>88%;Compliance with respiratory  medication;Verbalizes importance of monitoring SPO2 with pulse oximeter and return demonstration;Exhibits proper breathing techniques, such as pursed lip breathing or other method taught during program session   Comments barriers have been addressed this week by pulmonologist and orders are in place for new high flow home concentrator and o2 bleed in with CPAP   Goals/Expected Outcomes patient will have adequate home oxygen delivery systems to accomodate life needs.      Initial Exercise Prescription:     Initial Exercise Prescription - 04/29/17 1600      Date of Initial Exercise RX and Referring Provider   Date 04/29/17   Referring Provider Dr. [name]     Oxygen   Oxygen Continuous   Liters 8     Treadmill   MPH 1.8   Grade 0   Minutes 17     Bike   Level 1   Minutes 17     NuStep   Level 5   Minutes 17   METs 2     Prescription Details   Frequency (times per week) 2   Duration Progress to 45 minutes of aerobic exercise without signs/symptoms of physical distress     Intensity   THRR 40-80% of Max Heartrate 58-115   Ratings of Perceived Exertion 11-13   Perceived Dyspnea 0-4     Progression   Progression Continue progressive overload as per policy without signs/symptoms or physical distress.     Resistance Training   Training Prescription Yes   Weight Blue bands   Reps 10-15      Perform Capillary Blood Glucose checks as needed.  Exercise Prescription Changes:     Exercise Prescription Changes    Row Name 12/19/16 1200 12/24/16 1200 12/26/16 1234 12/31/16 1200 01/07/17 1200     Response to Exercise   Blood Pressure (Admit) 130/92 130/92 122/86 128/70 118/72   Blood Pressure (Exercise) 144/90 150/80 146/80 146/80 162/80   Blood Pressure (Exit) 140/82 130/86 104/70 100/60 122/78   Heart Rate (Admit) 90 bpm 87 bpm 87 bpm 83 bpm 83 bpm   Heart Rate (Exercise) 103 bpm 93 bpm 106 bpm 98 bpm 96 bpm   Heart Rate (Exit) 93 bpm 92 bpm 93 bpm 81 bpm 87 bpm    Oxygen Saturation (Admit) 90 % 97 % 98 % 95 % 94 %   Oxygen Saturation (Exercise) 90 % 91 % 89 % 89 % 88 %   Oxygen Saturation (Exit) 89 % 92 % 97 % 91 % 94 %   Rating of Perceived Exertion (Exercise) 13 13 13 11 13    Perceived Dyspnea (Exercise) 1 2 1 1 2    Duration Continue with 45 min of aerobic exercise without signs/symptoms of physical distress. Continue with 45 min of aerobic exercise without signs/symptoms of physical distress. Continue with 45 min of aerobic exercise without signs/symptoms of physical distress. Continue with 45 min of aerobic exercise without signs/symptoms of physical distress. Continue with 45 min of aerobic exercise without signs/symptoms of physical distress.   Intensity THRR unchanged THRR unchanged THRR unchanged THRR unchanged THRR unchanged  Progression   Progression Continue to progress workloads to maintain intensity without signs/symptoms of physical distress. Continue to progress workloads to maintain intensity without signs/symptoms of physical distress. Continue to progress workloads to maintain intensity without signs/symptoms of physical distress. Continue to progress workloads to maintain intensity without signs/symptoms of physical distress. Continue to progress workloads to maintain intensity without signs/symptoms of physical distress.     Resistance Training   Training Prescription Yes Yes Yes Yes Yes   Weight blue bands blue bands blue bands blue bands blue bands   Reps 10-15 10-15 10-15 10-15 10-15   Time 10 Minutes 10 Minutes 10 Minutes 10 Minutes 10 Minutes     Oxygen   Oxygen Continuous Continuous Continuous Continuous Continuous   Liters 4 4 4 4 4      Bike   Level  -  - 0.5 0.5 0.8   Minutes  -  - 17 17 17      NuStep   Level 3 5 5 5 5    Minutes 17 34 34 17 17   METs 2.2 2.3 2.3 2 2.4     Track   Laps 4 10  - 11 10   Minutes 17 17  - 17 17     Home Exercise Plan   Plans to continue exercise at  -  -  -  - Home (comment)    Frequency  -  -  -  - Add 3 additional days to program exercise sessions.   Row Name 01/09/17 1200 01/16/17 1200 01/21/17 1218 01/23/17 1200 01/28/17 1200     Response to Exercise   Blood Pressure (Admit) 126/64 126/88 124/64 146/80 120/64   Blood Pressure (Exercise) 140/76 118/68 132/74 120/86 122/70   Blood Pressure (Exit) 106/60 98/64 108/60 116/64 124/74   Heart Rate (Admit) 86 bpm 91 bpm 83 bpm 82 bpm 92 bpm   Heart Rate (Exercise) 99 bpm 98 bpm 100 bpm 109 bpm 106 bpm   Heart Rate (Exit) 84 bpm 87 bpm 93 bpm 88 bpm 92 bpm   Oxygen Saturation (Admit) 98 % 97 % 97 % 98 % 96 %   Oxygen Saturation (Exercise) 86 % 94 % 88 % 86 % 84 %   Oxygen Saturation (Exit) 93 % 97 % 90 % 88 % 89 %   Rating of Perceived Exertion (Exercise) 11 11 13 11 13    Perceived Dyspnea (Exercise) 1 0 0 1 1   Duration Continue with 45 min of aerobic exercise without signs/symptoms of physical distress. Continue with 45 min of aerobic exercise without signs/symptoms of physical distress. Continue with 45 min of aerobic exercise without signs/symptoms of physical distress. Continue with 45 min of aerobic exercise without signs/symptoms of physical distress. Continue with 45 min of aerobic exercise without signs/symptoms of physical distress.   Intensity THRR unchanged THRR unchanged THRR unchanged THRR unchanged THRR unchanged     Progression   Progression Continue to progress workloads to maintain intensity without signs/symptoms of physical distress. Continue to progress workloads to maintain intensity without signs/symptoms of physical distress. Continue to progress workloads to maintain intensity without signs/symptoms of physical distress. Continue to progress workloads to maintain intensity without signs/symptoms of physical distress. Continue to progress workloads to maintain intensity without signs/symptoms of physical distress.     Resistance Training   Training Prescription Yes Yes Yes Yes Yes   Weight blue  bands blue bands blue bands blue bands blue bands   Reps 10-15 10-15 10-15 10-15 10-15  Time 10 Minutes 10 Minutes 10 Minutes 10 Minutes 10 Minutes     Interval Training   Interval Training No No No No No     Oxygen   Oxygen Continuous Continuous Continuous Continuous Continuous   Liters 4-5 4-5 4-5 4-5 4-5     Bike   Level 0.8 0.6 0.8 0.8 0.8   Minutes 17 17 17 17 17      NuStep   Level  - 5 5  - 5   Minutes  - 17 17  - 17   METs  - 2 1.9  - 2.3     Track   Laps 8  - 13 16 10    Minutes 17  - 17 17 17    Row Name 01/30/17 1200 02/04/17 1100 02/06/17 1200 02/11/17 1200 02/13/17 1200     Response to Exercise   Blood Pressure (Admit) 136/70 132/70 130/80 134/64 122/60   Blood Pressure (Exercise) 140/80 140/80 150/96 140/76 140/74   Blood Pressure (Exit) 104/60 118/60 120/64 118/60 130/86   Heart Rate (Admit) 88 bpm 88 bpm 92 bpm 86 bpm 90 bpm   Heart Rate (Exercise) 110 bpm 107 bpm 117 bpm 102 bpm 99 bpm   Heart Rate (Exit) 96 bpm 93 bpm 106 bpm 93 bpm 71 bpm   Oxygen Saturation (Admit) 98 % 94 % 92 % 97 % 87 %   Oxygen Saturation (Exercise) 89 % 89 % 86 % 89 % 89 %   Oxygen Saturation (Exit) 98 % 95 % 89 % 90 % 97 %   Rating of Perceived Exertion (Exercise) 13 11 11 13 11    Perceived Dyspnea (Exercise) 1 1 1 2 1    Duration Continue with 45 min of aerobic exercise without signs/symptoms of physical distress. Continue with 45 min of aerobic exercise without signs/symptoms of physical distress. Continue with 45 min of aerobic exercise without signs/symptoms of physical distress. Continue with 45 min of aerobic exercise without signs/symptoms of physical distress. Continue with 45 min of aerobic exercise without signs/symptoms of physical distress.   Intensity THRR unchanged THRR unchanged THRR unchanged THRR unchanged THRR unchanged     Progression   Progression Continue to progress workloads to maintain intensity without signs/symptoms of physical distress. Continue to progress  workloads to maintain intensity without signs/symptoms of physical distress. Continue to progress workloads to maintain intensity without signs/symptoms of physical distress. Continue to progress workloads to maintain intensity without signs/symptoms of physical distress. Continue to progress workloads to maintain intensity without signs/symptoms of physical distress.     Resistance Training   Training Prescription Yes Yes Yes Yes Yes   Weight blue bands blue bands blue bands blue bands blue bands   Reps 10-15 10-15 10-15 10-15 10-15   Time 10 Minutes 10 Minutes 10 Minutes 10 Minutes 10 Minutes     Interval Training   Interval Training No No No No No     Oxygen   Oxygen Continuous Continuous Continuous Continuous Continuous   Liters 4-5 4-5 4-5 4-5 4-5     Bike   Level 0.8 0.8 1 1 1    Minutes 17 17 17 17 17      NuStep   Level 5 6  - 6  -   Minutes 17 17  - 17  -   METs 2.1 2.3  - 2.4  -     Track   Laps 11 11 12 9 9    Minutes 17 17 17 17 17    Row Name  02/18/17 1200 02/20/17 1200 02/25/17 1200 02/27/17 1200 05/06/17 1200     Response to Exercise   Blood Pressure (Admit) 136/70 122/74 120/70 122/70 122/80   Blood Pressure (Exercise) 128/80 140/80 150/70 150/80 140/90   Blood Pressure (Exit) 132/82 128/84 100/70 114/70 118/72   Heart Rate (Admit) 73 bpm 88 bpm 93 bpm 92 bpm 84 bpm   Heart Rate (Exercise) 97 bpm 99 bpm 110 bpm 107 bpm 99 bpm   Heart Rate (Exit) 84 bpm 88 bpm 95 bpm 92 bpm 80 bpm   Oxygen Saturation (Admit) 98 % 93 % 96 % 98 % 93 %   Oxygen Saturation (Exercise) 89 % 89 % 83 %  O2 increased to 8L sat improved to 87% on TM 89 % 86 %   Oxygen Saturation (Exit) 95 % 97 % 96 % 96 % 95 %   Rating of Perceived Exertion (Exercise) 9 13 13 11 11    Perceived Dyspnea (Exercise) 1 2 1  0 0   Duration Continue with 45 min of aerobic exercise without signs/symptoms of physical distress. Continue with 45 min of aerobic exercise without signs/symptoms of physical distress.  Continue with 45 min of aerobic exercise without signs/symptoms of physical distress. Continue with 45 min of aerobic exercise without signs/symptoms of physical distress. Continue with 45 min of aerobic exercise without signs/symptoms of physical distress.   Intensity THRR unchanged THRR unchanged THRR unchanged THRR unchanged THRR unchanged     Progression   Progression Continue to progress workloads to maintain intensity without signs/symptoms of physical distress. Continue to progress workloads to maintain intensity without signs/symptoms of physical distress. Continue to progress workloads to maintain intensity without signs/symptoms of physical distress. Continue to progress workloads to maintain intensity without signs/symptoms of physical distress. Continue to progress workloads to maintain intensity without signs/symptoms of physical distress.     Resistance Training   Training Prescription Yes Yes Yes Yes Yes   Weight blue bands blue bands blue bands blue bands blue bands   Reps 10-15 10-15 10-15 10-15 10-15   Time 10 Minutes 10 Minutes 10 Minutes 10 Minutes 10 Minutes     Interval Training   Interval Training No No No No No     Oxygen   Oxygen Continuous Continuous Continuous Continuous Continuous   Liters 4-5 8  8 8 8      Treadmill   MPH  -  - 1.8 1.8 1.8   Grade  -  - 0 0 0   Minutes  -  - 17 17 17      Bike   Level 1  - 1 1.2  -   Minutes 17  - 17 17  -     NuStep   Level 6 7 7   - 5   Minutes 17 17 17   - 17   METs 2.6 2.4 2.6  - 2.1     Track   Laps 8 9  -  -  -   Minutes 17 17  -  -  -   Row Name 05/13/17 1600 05/15/17 1200 05/20/17 1229 05/22/17 1200 05/27/17 1200     Response to Exercise   Blood Pressure (Admit) 130/74 118/68 120/74 120/70 140/70   Blood Pressure (Exercise) 150/72 142/82 132/88 156/82 132/66   Blood Pressure (Exit) 110/64 118/60 104/72 114/70 118/68   Heart Rate (Admit) 78 bpm 80 bpm 81 bpm 89 bpm 88 bpm   Heart Rate (Exercise) 96 bpm 88 bpm  99 bpm 104 bpm  100 bpm   Heart Rate (Exit) 87 bpm 78 bpm 83 bpm 81 bpm 80 bpm   Oxygen Saturation (Admit) 92 % 92 % 90 % 91 % 98 %   Oxygen Saturation (Exercise) 86 % 88 % 86 % 87 % 88 %   Oxygen Saturation (Exit) 91 % 99 % 99 % 98 % 99 %   Rating of Perceived Exertion (Exercise) 13 9 11 13 11    Perceived Dyspnea (Exercise) 2 0 1 1 1    Duration Continue with 45 min of aerobic exercise without signs/symptoms of physical distress. Continue with 45 min of aerobic exercise without signs/symptoms of physical distress. Continue with 45 min of aerobic exercise without signs/symptoms of physical distress. Continue with 45 min of aerobic exercise without signs/symptoms of physical distress. Continue with 45 min of aerobic exercise without signs/symptoms of physical distress.   Intensity THRR unchanged THRR unchanged THRR unchanged THRR unchanged THRR unchanged     Progression   Progression Continue to progress workloads to maintain intensity without signs/symptoms of physical distress. Continue to progress workloads to maintain intensity without signs/symptoms of physical distress. Continue to progress workloads to maintain intensity without signs/symptoms of physical distress. Continue to progress workloads to maintain intensity without signs/symptoms of physical distress. Continue to progress workloads to maintain intensity without signs/symptoms of physical distress.     Resistance Training   Training Prescription Yes Yes Yes Yes Yes   Weight blue bands blue bands blue bands blue bands blue bands   Reps 10-15 10-15 10-15 10-15 10-15   Time 10 Minutes 10 Minutes 10 Minutes 10 Minutes 10 Minutes     Interval Training   Interval Training No No No No No     Oxygen   Oxygen Continuous Continuous Continuous Continuous Continuous   Liters 8 8 10 10 10      Treadmill   MPH 1.8 1.8 1.8 1.8 1.8   Grade 0 0 0 0 0   Minutes 17 17 17 17 17      Bike   Level 1  - 1  - 1   Minutes 17  - 17  - 17      NuStep   Level 3 5 5 5 3   workload decreased not feeling well today   Minutes 17 17 17 17 17    METs 2.1 2.1 2.4 2.3 2.3   Row Name 06/03/17 1200             Response to Exercise   Blood Pressure (Admit) 146/80       Blood Pressure (Exercise) 118/70       Blood Pressure (Exit) 120/80       Heart Rate (Admit) 99 bpm       Heart Rate (Exercise) 112 bpm       Heart Rate (Exit) 91 bpm       Oxygen Saturation (Admit) 99 %       Oxygen Saturation (Exercise) 87 %       Oxygen Saturation (Exit) 98 %       Rating of Perceived Exertion (Exercise) 11       Perceived Dyspnea (Exercise) 1       Duration Continue with 45 min of aerobic exercise without signs/symptoms of physical distress.       Intensity THRR unchanged         Progression   Progression Continue to progress workloads to maintain intensity without signs/symptoms of physical distress.         Resistance  Training   Training Prescription Yes       Weight blue bands       Reps 10-15       Time 10 Minutes         Interval Training   Interval Training No         Oxygen   Oxygen Continuous       Liters 10         Treadmill   MPH 1.8       Grade 0       Minutes 17         NuStep   Level 5       Minutes 17       METs 2.4          Exercise Comments:     Exercise Comments    Row Name 01/07/17 1633           Exercise Comments Home exercise completed          Exercise Goals and Review:     Exercise Goals    [place] Name 04/25/17 1514             Exercise Goals   Increase Physical Activity Yes       Intervention Provide advice, education, support and counseling about physical activity/exercise needs.;Develop an individualized exercise prescription for aerobic and resistive training based on initial evaluation findings, risk stratification, comorbidities and participant's personal goals.       Expected Outcomes Achievement of increased cardiorespiratory fitness and enhanced flexibility, muscular endurance  and strength shown through measurements of functional capacity and personal statement of participant.       Increase Strength and Stamina Yes       Intervention Provide advice, education, support and counseling about physical activity/exercise needs.;Develop an individualized exercise prescription for aerobic and resistive training based on initial evaluation findings, risk stratification, comorbidities and participant's personal goals.       Expected Outcomes Achievement of increased cardiorespiratory fitness and enhanced flexibility, muscular endurance and strength shown through measurements of functional capacity and personal statement of participant.       Able to understand and use rate of perceived exertion (RPE) scale Yes       Intervention Provide education and explanation on how to use RPE scale       Expected Outcomes Short Term: Able to use RPE daily in rehab to express subjective intensity level;Long Term:  Able to use RPE to guide intensity level when exercising independently       Able to understand and use Dyspnea scale Yes       Intervention Provide education and explanation on how to use Dyspnea scale       Expected Outcomes Short Term: Able to use Dyspnea scale daily in rehab to express subjective sense of shortness of breath during exertion;Long Term: Able to use Dyspnea scale to guide intensity level when exercising independently       Knowledge and understanding of Target Heart Rate Range (THRR) Yes       Intervention Provide education and explanation of THRR including how the numbers were predicted and where they are located for reference       Expected Outcomes Short Term: Able to state/look up THRR;Long Term: Able to use THRR to govern intensity when exercising independently;Short Term: Able to use daily as guideline for intensity in rehab       Understanding of Exercise Prescription Yes       Intervention  Provide education, explanation, and written materials on patient's  individual exercise prescription       Expected Outcomes Short Term: Able to explain program exercise prescription;Long Term: Able to explain home exercise prescription to exercise independently          Exercise Goals Re-Evaluation :     Exercise Goals Re-Evaluation    Row Name 12/30/16 0707 01/27/17 1027 02/25/17 1556 05/12/17 1335 06/14/17 1406     Exercise Goal Re-Evaluation   Exercise Goals Review Increase Strenth and Stamina;Increase Physical Activity Increase Physical Activity;Increase Strenth and Stamina Increase Strenth and Stamina;Increase Physical Activity Increase Strength and Stamina;Able to understand and use Dyspnea scale;Able to understand and use rate of perceived exertion (RPE) scale;Increase Physical Activity;Knowledge and understanding of Target Heart Rate Range (THRR);Understanding of Exercise Prescription Increase Physical Activity;Increase Strength and Stamina;Able to understand and use Dyspnea scale;Able to understand and use rate of perceived exertion (RPE) scale;Knowledge and understanding of Target Heart Rate Range (THRR);Understanding of Exercise Prescription   Comments Patient has had slow start due to bouts of hypertension and shoulder injury from fall. Will cont. to monitor and progress as appropriate.  Patient has had slow start due to bouts of hypertension and shoulder injury from fall. Will cont. to monitor and progress as appropriate.  Patient has advanced to using the treadmill. MET average ranges from 2.4-2.6. Home exercise reviewed. Will cont to monitor and progress.  Patient has only attended one exercise session. Will cont. to monitor and progress.  Patient is struggling with exercise progression due to disease progression. METs range in the "low" level. Patient has purchased a treadmill and is going to start exercising at home. Will cont. to monitor and progress as able.    Expected Outcomes Through exercising at rehab and at home, patient will increase physical  strength and stamina and find ADL's easier to perform.  Through the exercise here at rehab the patient with increase physical capacity, strength, and stamina. Through the exercise here at rehab the patient with increase physical capacity, strength, and stamina. Through the exercise here at rehab the patient with increase physical capacity, strength, and stamina. Through the exercise here at rehab the patient with increase physical capacity, strength, and stamina.      Discharge Exercise Prescription (Final Exercise Prescription Changes):     Exercise Prescription Changes - 06/03/17 1200      Response to Exercise   Blood Pressure (Admit) 146/80   Blood Pressure (Exercise) 118/70   Blood Pressure (Exit) 120/80   Heart Rate (Admit) 99 bpm   Heart Rate (Exercise) 112 bpm   Heart Rate (Exit) 91 bpm   Oxygen Saturation (Admit) 99 %   Oxygen Saturation (Exercise) 87 %   Oxygen Saturation (Exit) 98 %   Rating of Perceived Exertion (Exercise) 11   Perceived Dyspnea (Exercise) 1   Duration Continue with 45 min of aerobic exercise without signs/symptoms of physical distress.   Intensity THRR unchanged     Progression   Progression Continue to progress workloads to maintain intensity without signs/symptoms of physical distress.     Resistance Training   Training Prescription Yes   Weight blue bands   Reps 10-15   Time 10 Minutes     Interval Training   Interval Training No     Oxygen   Oxygen Continuous   Liters 10     Treadmill   MPH 1.8   Grade 0   Minutes 17     NuStep   Level 5  Minutes 17   METs 2.4      Nutrition:  Target Goals: Understanding of nutrition guidelines, daily intake of sodium <1559m, cholesterol <2057m calories 30% from fat and 7% or less from saturated fats, daily to have 5 or more servings of fruits and vegetables.  Biometrics:      Post Biometrics - 03/04/17 1232       Post  Biometrics   Height 5' 6"  (1.676 m)   Weight 210 lb 8.6 oz (95.5  kg)   BMI (Calculated) 34.1   Grip Strength 15 kg   Flexibility 35.5 in      Nutrition Therapy Plan and Nutrition Goals:     Nutrition Therapy & Goals - 06/05/17 1357      Nutrition Therapy   Diet Therapeutic Lifestyle Changes     Personal Nutrition Goals   Nutrition Goal Wt loss of 1-2 lb/week to a wt loss goal of 6-24 lb at graduation from Pulmonary Rehab   Personal Goal #2 Identify and limit food sources of sodium     Intervention Plan   Intervention Prescribe, educate and counsel regarding individualized specific dietary modifications aiming towards targeted core components such as weight, hypertension, lipid management, diabetes, heart failure and other comorbidities.   Expected Outcomes Short Term Goal: Understand basic principles of dietary content, such as calories, fat, sodium, cholesterol and nutrients.;Long Term Goal: Adherence to prescribed nutrition plan.      Nutrition Discharge: Rate Your Plate Scores:     Nutrition Assessments - 05/12/17 1027      Rate Your Plate Scores   Pre Score 59   Post Score 61      Nutrition Goals Re-Evaluation:     Nutrition Goals Re-Evaluation    Row Name 03/13/17 0717 05/12/17 1028           Goals   Current Weight 210 lb 8.6 oz (95.5 kg) 210 lb 5.1 oz (95.4 kg)      Nutrition Goal  - Wt loss of 1-2 lb/week to a wt loss goal of 6-24 lb at graduation from Pulmonary Rehab      Comment Pt wt is down 2 lb since admission. Wt loss goal wt not achieved. Pt has been limiting food sources of sodium and now always/usually compares and chooses lower sodium food options.  Pt has maintained his wt while in rehab. Wt loss goal wt not achieved. Pt has been limiting food sources of sodium and now usually compares and chooses lower sodium food options.         Personal Goal #2 Re-Evaluation   Personal Goal #2  - Identify and limit food sources of sodium         Nutrition Goals Discharge (Final Nutrition Goals Re-Evaluation):      Nutrition Goals Re-Evaluation - 05/12/17 1028      Goals   Current Weight 210 lb 5.1 oz (95.4 kg)   Nutrition Goal Wt loss of 1-2 lb/week to a wt loss goal of 6-24 lb at graduation from Pulmonary Rehab   Comment Pt has maintained his wt while in rehab. Wt loss goal wt not achieved. Pt has been limiting food sources of sodium and now usually compares and chooses lower sodium food options.      Personal Goal #2 Re-Evaluation   Personal Goal #2 Identify and limit food sources of sodium      Psychosocial: Target Goals: Acknowledge presence or absence of significant depression and/or stress, maximize coping skills, provide positive support system. Participant  is able to verbalize types and ability to use techniques and skills needed for reducing stress and depression.  Initial Review & Psychosocial Screening:     Initial Psych Review & Screening - 04/25/17 1547      Initial Review   Current issues with None Identified   Source of Stress Concerns None Identified     Family Dynamics   Good Support System? Yes     Barriers   Psychosocial barriers to participate in program There are no identifiable barriers or psychosocial needs.      Quality of Life Scores:   PHQ-9: Recent Review Flowsheet Data    Depression screen [facility] 2/9 04/25/2017 03/04/2017 11/15/2016   Decreased Interest 0 0 0   Down, Depressed, Hopeless 0 0 0   PHQ - 2 Score 0 0 0     Interpretation of Total Score  Total Score Depression Severity:  1-4 = Minimal depression, 5-9 = Mild depression, 10-14 = Moderate depression, 15-19 = Moderately severe depression, 20-27 = Severe depression   Psychosocial Evaluation and Intervention:     Psychosocial Evaluation - 04/25/17 1548      Psychosocial Evaluation & Interventions   Interventions Encouraged to exercise with the program and follow exercise prescription   Continue Psychosocial Services  No Follow up required      Psychosocial Re-Evaluation:     Psychosocial  Re-Evaluation    [place] Name 12/31/16 0830 01/30/17 1304 05/16/17 1134 06/13/17 1132       Psychosocial Re-Evaluation   Current issues with Current Stress Concerns  - Current Sleep Concerns Current Sleep Concerns;Current Depression;History of Depression;Current Stress Concerns    Comments patient extremely concerned over his most recent diagnosis of hypertension patient more acceptance of hypertension diagnosis now that he sees his medication is working and his BP is controlled patient has a habit of waking at the same time every night and migrating to the kitchen and eating sweets. he has had behavior modification therapy in the past with success. he is incoraged to revisit need for therapy. this is keeping patient from getting a full nights rest and causing him to continue to gain weight. patient has been prescribed antidepressant this week by pulmonologist. Will address stress of illness, depression, and sleep with patient over then next 4-6 weeks as medication takes effect    Expected Outcomes through education and seeing improvement of his hypertension with medications patient will express resolved concern for hypertension through education and seeing improvement of his hypertension with medications patient will express resolved concern for hypertension patient will verbalize behavoir modification stratagies to help with getting up and eating with nighttime awakening patient will verbalize behavoir modification stratagies to help with getting up and eating with nighttime awakening and will verbalize he has more energy and enjoyment in everyday activities    Interventions Encouraged to attend Pulmonary Rehabilitation for the exercise Encouraged to attend Pulmonary Rehabilitation for the exercise Encouraged to attend Pulmonary Rehabilitation for the exercise Encouraged to attend Pulmonary Rehabilitation for the exercise    Continue Psychosocial Services  Follow up required by staff Follow up required by  staff Follow up required by staff Follow up required by staff  folllow up required by MD    Comments  -  -  - worsening of chronic illness      Initial Review   Source of Stress Concerns Chronic Illness;Unable to participate in former interests or hobbies;Unable to perform yard/household activities Chronic Illness;Unable to participate in former interests or hobbies;Unable to  perform yard/household activities None Identified Chronic Illness       Psychosocial Discharge (Final Psychosocial Re-Evaluation):     Psychosocial Re-Evaluation - 06/13/17 1132      Psychosocial Re-Evaluation   Current issues with Current Sleep Concerns;Current Depression;History of Depression;Current Stress Concerns   Comments patient has been prescribed antidepressant this week by pulmonologist. Will address stress of illness, depression, and sleep with patient over then next 4-6 weeks as medication takes effect   Expected Outcomes patient will verbalize behavoir modification stratagies to help with getting up and eating with nighttime awakening and will verbalize he has more energy and enjoyment in everyday activities   Interventions Encouraged to attend Pulmonary Rehabilitation for the exercise   Continue Psychosocial Services  Follow up required by staff  folllow up required by MD   Comments worsening of chronic illness     Initial Review   Source of Stress Concerns Chronic Illness      Education: Education Goals: Education classes will be provided on a weekly basis, covering required topics. Participant will state understanding/return demonstration of topics presented.  Learning Barriers/Preferences:   Education Topics: Risk Factor Reduction:  -Group instruction that is supported by a PowerPoint presentation. Instructor discusses the definition of a risk factor, different risk factors for pulmonary disease, and how the heart and lungs work together.     Nutrition for Pulmonary Patient:  -Group  instruction provided by PowerPoint slides, verbal discussion, and written materials to support subject matter. The instructor gives an explanation and review of healthy diet recommendations, which includes a discussion on weight management, recommendations for fruit and vegetable consumption, as well as protein, fluid, caffeine, fiber, sodium, sugar, and alcohol. Tips for eating when patients are short of breath are discussed.   PULMONARY REHAB OTHER RESPIRATORY from 06/03/2017 in [employer]  Date  02/13/17  Educator  RD  Instruction Review Code  2- meets goals/outcomes      Pursed Lip Breathing:  -Group instruction that is supported by demonstration and informational handouts. Instructor discusses the benefits of pursed lip and diaphragmatic breathing and detailed demonstration on how to preform both.     Oxygen Safety:  -Group instruction provided by PowerPoint, verbal discussion, and written material to support subject matter. There is an overview of "What is Oxygen" and "Why do we need it".  Instructor also reviews how to create a safe environment for oxygen use, the importance of using oxygen as prescribed, and the risks of noncompliance. There is a brief discussion on traveling with oxygen and resources the patient may utilize.   PULMONARY REHAB OTHER RESPIRATORY from 06/03/2017 in [place]  Date  02/06/17  Educator  Alexxis Mackert  Instruction Review Code  2- meets goals/outcomes      Oxygen Equipment:  -Group instruction provided by [employer] Staff utilizing handouts, written materials, and equipment demonstrations.   PULMONARY REHAB OTHER RESPIRATORY from 06/03/2017 in [place]  Date  01/16/17  Educator  George[facility]  Instruction Review Code  2- meets goals/outcomes      Signs and Symptoms:  -Group instruction provided by written material and verbal discussion to support subject  matter. Warning signs and symptoms of infection, stroke, and heart attack are reviewed and when to call the physician/911 reinforced. Tips for preventing the spread of infection discussed.   PULMONARY REHAB OTHER RESPIRATORY from 06/03/2017 in [place]  Date  05/15/17  Educator  rn  Instruction Review Code  2- meets goals/outcomes      Advanced Directives:  -Group instruction provided by verbal instruction and written material to support subject matter. Instructor reviews Advanced Directive laws and proper instruction for filling out document.   Pulmonary Video:  -Group video education that reviews the importance of medication and oxygen compliance, exercise, good nutrition, pulmonary hygiene, and pursed lip and diaphragmatic breathing for the pulmonary patient.   PULMONARY REHAB OTHER RESPIRATORY from 06/03/2017 in [place]  Date  05/22/17  Instruction Review Code  2- meets goals/outcomes      Exercise for the Pulmonary Patient:  -Group instruction that is supported by a PowerPoint presentation. Instructor discusses benefits of exercise, core components of exercise, frequency, duration, and intensity of an exercise routine, importance of utilizing pulse oximetry during exercise, safety while exercising, and options of places to exercise outside of rehab.     PULMONARY REHAB OTHER RESPIRATORY from 06/03/2017 in [place]  Date  05/06/17  Educator  ep  Instruction Review Code  R- Review/reinforce      Pulmonary Medications:  -Verbally interactive group education provided by instructor with focus on inhaled medications and proper administration.   PULMONARY REHAB OTHER RESPIRATORY from 06/03/2017 in [place]  Date  01/09/17  Educator  Pharm  Instruction Review Code  2- meets goals/outcomes      Anatomy and Physiology of the Respiratory System and  Intimacy:  -Group instruction provided by PowerPoint, verbal discussion, and written material to support subject matter. Instructor reviews respiratory cycle and anatomical components of the respiratory system and their functions. Instructor also reviews differences in obstructive and restrictive respiratory diseases with examples of each. Intimacy, Sex, and Sexuality differences are reviewed with a discussion on how relationships can change when diagnosed with pulmonary disease. Common sexual concerns are reviewed.   MD DAY -A group question and answer session with a medical doctor that allows participants to ask questions that relate to their pulmonary disease state.   PULMONARY REHAB OTHER RESPIRATORY from 06/03/2017 in [place]  Date  06/03/17  Educator  yacoub  Instruction Review Code  2- meets goals/outcomes      OTHER EDUCATION -Group or individual verbal, written, or video instructions that support the educational goals of the pulmonary rehab program.   Knowledge Questionnaire Score:     Knowledge Questionnaire Score - 04/30/17 0913      Knowledge Questionnaire Score   Pre Score 11/13      Core Components/Risk Factors/Patient Goals at Admission:     Personal Goals and Risk Factors at Admission - 04/25/17 1546      Core Components/Risk Factors/Patient Goals on Admission   Improve shortness of breath with ADL's Yes   Intervention Provide education, individualized exercise plan and daily activity instruction to help decrease symptoms of SOB with activities of daily living.   Expected Outcomes Short Term: Achieves a reduction of symptoms when performing activities of daily living.   Hypertension Yes   Intervention Provide education on lifestyle modifcations including regular physical activity/exercise, weight management, moderate sodium restriction and increased consumption of fresh fruit, vegetables, and low fat dairy, alcohol moderation, and  smoking cessation.;Monitor prescription use compliance.   Expected Outcomes Short Term: Continued assessment and intervention until BP is < 140/57m HG in hypertensive participants. < 130/843mHG in hypertensive participants with diabetes, heart failure or chronic kidney disease.;Long Term: Maintenance of blood pressure at  goal levels.      Core Components/Risk Factors/Patient Goals Review:      Goals and Risk Factor Review    Row Name 12/31/16 0829 01/30/17 1304 02/25/17 1551 05/16/17 1132 06/13/17 1120     Core Components/Risk Factors/Patient Goals Review   Personal Goals Review Weight Management/Obesity;Improve shortness of breath with ADL's;Develop more efficient breathing techniques such as purse lipped breathing and diaphragmatic breathing and practicing self-pacing with activity.;Hypertension;Stress Weight Management/Obesity;Improve shortness of breath with ADL's;Develop more efficient breathing techniques such as purse lipped breathing and diaphragmatic breathing and practicing self-pacing with activity.;Hypertension;Stress Weight Management/Obesity;Improve shortness of breath with ADL's;Develop more efficient breathing techniques such as purse lipped breathing and diaphragmatic breathing and practicing self-pacing with activity.;Hypertension;Stress Develop more efficient breathing techniques such as purse lipped breathing and diaphragmatic breathing and practicing self-pacing with activity.;Hypertension;Stress;Weight Management/Obesity;Improve shortness of breath with ADL's;Increase knowledge of respiratory medications and ability to use respiratory devices properly. Develop more efficient breathing techniques such as purse lipped breathing and diaphragmatic breathing and practicing self-pacing with activity.;Hypertension;Stress;Weight Management/Obesity;Improve shortness of breath with ADL's;Increase knowledge of respiratory medications and ability to use respiratory devices properly.   Review  see comment section on ITP see comment section on ITP his weight remains at baseline. his wife states the patient is more aware of the "unhealthy" things he eats. he utilizes PLB and pacing however he sometimes feels he cant get enough air through his nose. I have worked with patient on taking in larger breaths through his mouth and blowing out through pursed lips. This technique makes him feel more satisfied with air consumption and decreases the anxiety he has when he feels he cant get enough air through his nose. he has learned to pace himself so that he can tolerate workload increases. his hypertension has resolved with medication. 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 he admits to not following through with his discharge home exercise plan and by being sedentary for the last several months patient has become deconditioned. It is too early in this admission to evaluate progress towards current program and personal goals. Patient is attempting to be more compliant with activity prescribed at home. He voices feeling "worse" on a daily basis and has seen his pulmonologist this week for a full work-up. His BP remains high at times, his SOB is worsening and his oxygen requirements are increasing. Will have a better idea of treatment plan goals once all test results are reviewed by md.   Expected Outcomes see admission expected outcomes see admission expected outcomes see admission expected outcomes see admission expected outcomes see admission expected outcomes      Core Components/Risk Factors/Patient Goals at Discharge (Final Review):      Goals and Risk Factor Review - 06/13/17 1120      Core Components/Risk Factors/Patient Goals Review   Personal Goals Review Develop more efficient breathing techniques such as purse lipped breathing and diaphragmatic breathing and practicing self-pacing with  activity.;Hypertension;Stress;Weight Management/Obesity;Improve shortness of breath with ADL's;Increase knowledge of respiratory medications and ability to use respiratory devices properly.   Review Patient is attempting to be more compliant with activity prescribed at home. He voices feeling "worse" on a daily basis and has seen his pulmonologist this week for a full work-up. His BP remains high at times, his SOB is worsening and his oxygen requirements are increasing. Will have a better idea of treatment plan goals once all test results are reviewed  by md.   Expected Outcomes see admission expected outcomes      ITP Comments:   Comments: ITP REVIEW Pt is making slow progress toward pulmonary rehab goals after completing 7 sessions. Recommend continued exercise, life style modification, education, and utilization of breathing techniques to increase stamina and strength and decrease shortness of breath with exertion.

## 2017-06-17 ENCOUNTER — Encounter (HOSPITAL_COMMUNITY)
Admission: RE | Admit: 2017-06-17 | Discharge: 2017-06-17 | Disposition: A | Discharge status: Home / Self Care | Payer: Medicare Other | Source: Ambulatory Visit | Attending: Pulmonary Disease | Admitting: Pulmonary Disease

## 2017-06-17 DIAGNOSIS — J849 Interstitial pulmonary disease, unspecified: Secondary | ICD-10-CM

## 2017-06-19 ENCOUNTER — Encounter (HOSPITAL_COMMUNITY): Payer: Self-pay

## 2017-06-19 ENCOUNTER — Encounter (HOSPITAL_COMMUNITY)
Admission: RE | Admit: 2017-06-19 | Discharge: 2017-06-19 | Disposition: A | Discharge status: Home / Self Care | Payer: Medicare Other | Source: Ambulatory Visit | Attending: Pulmonary Disease | Admitting: Pulmonary Disease

## 2017-06-19 VITALS — Wt 213.4 lb

## 2017-06-19 DIAGNOSIS — J849 Interstitial pulmonary disease, unspecified: Secondary | ICD-10-CM

## 2017-06-19 NOTE — Progress Notes (Signed)
Jeffrey Cobb 76 y.o. male  DOB: 1940-11-05 MRN: [id number]           Nutrition Note 1. ILD (interstitial lung disease) ([place])    Meds reviewed. Prednisone noted  Note Spoke with pt. Pt is obese. Pt eats 2 meals a day; most meals usually prepared at home.  There are some ways the pt can make his eating habits healthier. Pt's Rate Your Plate results reviewed with pt. Pt avoids most salty food; uses no added salt or low sodium canned food. The role of sodium in lung disease reviewed with pt. Per discussion, pt c/o decreased appetite and increased frequency of diarrhea ? due to OFEV. Pt wants to lose wt. Barrier to wt loss include pt's nightly nocturnal dessert consumption. Pt expressed understanding of the information reviewed via feedback method.    Nutrition Diagnosis ? Food-and nutrition-related knowledge deficit related to lack of exposure to information as related to diagnosis of pulmonary disease ? 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 Pt's individual nutrition plan and goals reviewed with pt. ? Benefits of adopting healthy eating habits discussed when pt's Rate Your Plate reviewed.  Goal(s) continued from previous admission 1. Identify food quantities necessary to achieve wt loss of  -2# per week to a goal wt loss of 2.7-10.9 kg (6-24 lb) at graduation from pulmonary rehab. 2. Identify and limit food sources of sodium - met; discontinue ? Pt to increase fish consumption to at least 1-2 times/week ? Pt to increase consumption of fruits and vegetables from 0-1 cup per day to 2-3 cups per day. ? Pt to use nuts/peanut butter as a small snack or protein choice 2 times/week or more.   Plan:  Pt to attend Pulmonary Nutrition class - met 02/13/17 Will provide client-centered nutrition education as part of interdisciplinary care.   Monitor and evaluate progress toward nutrition goal with team.  Monitor and Evaluate progress  toward nutrition goal with team.   Derek Mound, M.Ed, RD, LDN, CDE 06/19/2017 1:11 PM

## 2017-06-19 NOTE — Progress Notes (Signed)
Daily Session Note  Patient Details  Name: Jeffrey Cobb MRN: [id number] Date of Birth: [birth year] Referring Provider:     Pulmonary Rehab Walk Test from 04/29/2017 in [place]  Referring Provider  Dr. [name]      Encounter Date: 06/19/2017  Check In:     Session Check In - 06/19/17 1052      Check-In   Location [facility]   Staff Present Trish Fountain, RN, BSN;Lisa Ysidro Evert, RN;Joan Hopkins, RN, BSN;Kimberley Speece, MS, ACSM RCEP, Exercise Physiologist   Supervising physician immediately available to respond to emergencies [facility] immediately available   Physician(s) Dr. Wendee Beavers   Medication changes reported     No   Fall or balance concerns reported    No   Tobacco Cessation No Change   Warm-up and Cool-down Performed as group-led instruction   Resistance Training Performed Yes   VAD Patient? No     Pain Assessment   Currently in Pain? No/denies   Multiple Pain Sites No      Capillary Blood Glucose: No results found for this or any previous visit (from the past 24 hour(s)).      Exercise Prescription Changes - 06/19/17 1200      Response to Exercise   Blood Pressure (Admit) 138/80   Blood Pressure (Exercise) 144/80   Blood Pressure (Exit) 122/80   Heart Rate (Admit) 77 bpm   Heart Rate (Exercise) 102 bpm   Heart Rate (Exit) 80 bpm   Oxygen Saturation (Admit) 95 %   Oxygen Saturation (Exercise) 84 %   Oxygen Saturation (Exit) 99 %   Rating of Perceived Exertion (Exercise) 13   Perceived Dyspnea (Exercise) 1   Duration Continue with 45 min of aerobic exercise without signs/symptoms of physical distress.   Intensity THRR unchanged     Progression   Progression Continue to progress workloads to maintain intensity without signs/symptoms of physical distress.     Resistance Training   Training Prescription Yes   Weight blue bands   Reps 10-15   Time 10 Minutes     Interval Training   Interval  Training No     Oxygen   Oxygen Continuous   Liters 10     Treadmill   MPH 1.8   Grade 0   Minutes 17     NuStep   Level 5   Minutes 17   METs 2.4      History  Smoking Status  . Former Smoker  . Packs/day: 3.00  . Years: 17.00  . Types: Cigarettes  . Quit date: 08/26/1977  Smokeless Tobacco  . Never Used    Goals Met:  Exercise tolerated well No report of cardiac concerns or symptoms Strength training completed today  Goals Unmet:  Not Applicable  Comments: Service time is from 10:30a to 12:30p    Dr. Rush Farmer is Medical Director for Pulmonary Rehab at [facility].

## 2017-06-20 ENCOUNTER — Telehealth: Payer: Self-pay | Admitting: Pulmonary Disease

## 2017-06-20 ENCOUNTER — Encounter: Payer: Self-pay | Admitting: Pulmonary Disease

## 2017-06-20 ENCOUNTER — Ambulatory Visit (INDEPENDENT_AMBULATORY_CARE_PROVIDER_SITE_OTHER): Payer: Medicare Other | Admitting: Pulmonary Disease

## 2017-06-20 VITALS — BP 120/72 | HR 85 | Ht 71.0 in | Wt 209.2 lb

## 2017-06-20 DIAGNOSIS — M7989 Other specified soft tissue disorders: Secondary | ICD-10-CM

## 2017-06-20 DIAGNOSIS — I272 Pulmonary hypertension, unspecified: Secondary | ICD-10-CM | POA: Diagnosis not present

## 2017-06-20 DIAGNOSIS — J209 Acute bronchitis, unspecified: Secondary | ICD-10-CM | POA: Diagnosis not present

## 2017-06-20 DIAGNOSIS — J84112 Idiopathic pulmonary fibrosis: Secondary | ICD-10-CM | POA: Diagnosis not present

## 2017-06-20 MED ORDER — SODIUM CHLORIDE 3 % IN NEBU: INHALATION_SOLUTION | Freq: Two times a day (BID) | RESPIRATORY_TRACT | 12 refills | Status: DC | PRN

## 2017-06-20 MED ORDER — FUROSEMIDE 20 MG PO TABS: ORAL_TABLET | ORAL | 1 refills | Status: DC

## 2017-06-20 MED ORDER — ALBUTEROL SULFATE (2.5 MG/3ML) 0.083% IN NEBU: 2.5000 mg | INHALATION_SOLUTION | Freq: Four times a day (QID) | RESPIRATORY_TRACT | 11 refills | Status: AC | PRN

## 2017-06-20 MED ORDER — DOXYCYCLINE HYCLATE 100 MG PO TABS: 100.0000 mg | ORAL_TABLET | Freq: Two times a day (BID) | ORAL | 0 refills | Status: DC

## 2017-06-20 NOTE — Patient Instructions (Signed)
For leg swelling secondary to mild pulmonary hypertension: Take 60 mg of Lasix daily with a banana until your weight is down to 196.  At that point you do not need to continue taking the medicine unless her weight goes above 198.  Chronic respiratory failure with hypoxemia: Continue 3 L of oxygen at rest, 8 liters with exertion on exercise  Idiopathic pulmonary fibrosis: Continue Ofev  Acute bronchitis: Tigecycline, will prescribe today Continue Tussionex, call us if you need a refill on this  I will see you back in 2-3 weeks or sooner if needed

## 2017-06-20 NOTE — Progress Notes (Signed)
Subjective:    Patient ID: Jeffrey Cobb, male    DOB: [birth year], 76 y.o.   MRN: [id number]  Synopsis: Former patient of Dr. 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 Follow-up    dyspnea, still has yellow green mucus when he coughs, 3L pulse helps with exertion    [name] thinks that he feels a little better.  He said that the lasix really didn't make him urinate much.  He says that his bathroom scale had him at 201 this morning, down from 208 last week.  He's still coughing up mucus, he has a hard time getting it out. He is taken mucinex which has helped him some in the past, he doesn't think it helps a whole lot.   He says the nebulizer helps him clear mucus. The tussionex really helped a lot. He got the 10 L concentrator and he is exercising more now at home   Past Medical History:  Diagnosis Date  . Acute sinusitis, unspecified   . Arthritis   . 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 GERD (gastroesophageal reflux disease)   . Left anterior fascicular block    hx of on ekg  . Mixed hyperlipidemia   . Obstructive chronic bronchitis with exacerbation (HCC)   . Occlusion and stenosis of carotid artery without mention of cerebral infarction    a. 05/2013 Carotid U/S: 1-39% bilat stenosis.  . Other emphysema (HCC)   . 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 Sleep apnea    does not use cpap due to cough  . Stroke [facility]) 05/2015      Review of Systems  Constitutional: Negative for chills, fatigue and fever.  HENT: Positive for rhinorrhea. Negative for sinus pressure and sneezing.   Respiratory: Positive for cough. Negative for shortness of breath and wheezing.   Cardiovascular: Negative for chest  pain, palpitations and leg swelling.       Objective:   Physical Exam Vitals:   06/20/17 0950  BP: 120/72  Pulse: 85  SpO2: 95%  Weight: 209 lb 3.2 oz (94.9 kg)  Height: 5\' 11"  (1.803 Jeffrey)   5L pm pulse  Gen: chronically ill appearing HENT: OP clear, TM's clear, neck supple PULM: Crackles Bases bilaterally, normal percussion CV: RRR, no mgr, trace edema GI: BS+, soft, nontender Derm: no cyanosis or rash Psyche: normal mood and affect     CBC    Component Value Date/Time   WBC 6.7 05/31/2015 1730   RBC 4.64 05/31/2015 1730   HGB 14.6 05/31/2015 1730   HCT 41.5 05/31/2015 1730   PLT 138 (L) 05/31/2015 1730   MCV 89.4 05/31/2015 1730   MCH 31.5 05/31/2015 1730   MCHC 35.2 05/31/2015 1730   RDW 13.4 05/31/2015 1730   LYMPHSABS 2.2 09/02/2014 1204   MONOABS 0.7 09/02/2014 1204   EOSABS 0.2 09/02/2014 1204   BASOSABS 0.0 09/02/2014 1204     PFT PFT"s 2009:  FEV1 3.49 (115%), ratio 65, DLCO 67%. PFT's 08/2014:  FEV1 3.13 (114%), ratio 69, decreased airtrapping from 2009, DLCO 49% June 2017 pulmonary function testing FEV1 2.99 L (111% predicted, FVC 3.67 L (98% predicted), total lung capacity 5.53 L (85% predicted),  DLCO 12.76 (45% predicted). March 2018 pulmonary function testing ratio 80%, FVC 2.94 L 79% predicted, total lung capacity 4.51 L 69% predicted, DLCO 9.05 31% predicted 05/2017 PFT> FVC 2.6L, DLCO 7.1 24% pred  Imaging: 01/2016 HRCT > traction bronchiolectasis and interlobular septal thickening in a basilar predominance have progressed compared to July 2016, there remains no frank honeycombing. There are multiple pulmonary nodules which are stable or have resolved. February 2018 high-resolution CT chest independently reviewed showing traction bronchiectasis, interlobular septal thickening and honeycombing worse in the periphery and bases.  6 minute walk test: August 2016 6 minute walk test 336 Jeffrey O2 sats ration 92% on room air 04/27/2016 6 minute walk  distance 292.5 Jeffrey O2 sats ration and completion of test was 82% on room air. July 2018 6 minute walk: Distance 1231 feet, O2 saturation 88% on 10 L nasal cannula October 2018 distance 216 Jeffrey  Other testing: September 2017 pH probe showed normal pH and no clear evidence of gastroesophageal reflux. Treated were 2018 echocardiogram normal LVEF, RVSP 38  CPAP compliance report 02/2017> only using 39% of nights  Records from his visit with pulmonary rehabilitation reviewed, he's requiring up to 10 L continuous while working out on a treadmill.     Assessment & Plan:  Pulmonary hypertension, low resistance (HCC)  IPF (idiopathic pulmonary fibrosis) (HCC)  Acute bronchitis, unspecified organism  Leg swelling  Discussion: Jeffrey Cobb is feeling a bit better, though he still producing some mucus.  His weight has decreased by about 5 or 6 pounds since last week, though he continues to have some leg swelling.  His echocardiogram showed a mild degree of pulmonary hypertension.  I think we could stand to get his weight down by another 5 pounds.  He needs to have an extended course of doxycycline because he is continuing to produce mucus though feeling better.  I am okay with refilling Tussionex if needed.  Plan: For leg swelling secondary to mild pulmonary hypertension: Take 60 mg of Lasix daily with a banana until your weight is down to 196.  At that point you do not need to continue taking the medicine unless her weight goes above 198.  Chronic respiratory failure with hypoxemia: Continue 3 L of oxygen at rest, 8 liters with exertion on exercise  Idiopathic pulmonary fibrosis: Continue Ofev  Acute bronchitis: Tigecycline, will prescribe today Continue Tussionex, call us if you need a refill on this Use hypertonic saline nebulizer twice a day as needed for chest congestion.  I will see you back in 2-3 weeks or sooner if needed    Current Outpatient Prescriptions:  .  albuterol (PROVENTIL)  (2.5 MG/3ML) 0.083% nebulizer solution, Take 3 mLs (2.5 mg total) by nebulization every 6 (six) hours as needed for wheezing or shortness of breath., Disp: 360 mL, Rfl: 11 .  amLODipine (NORVASC) 2.5 MG tablet, Take 2.5 mg by mouth daily., Disp: , Rfl:  .  aspirin 81 MG tablet, Take 81 mg by mouth daily., Disp: , Rfl:  .  atorvastatin (LIPITOR) 40 MG tablet, Take 1 tablet (40 mg total) by mouth daily at 6 PM., Disp: 30 tablet, Rfl: 0 .  chlorpheniramine-HYDROcodone (TUSSIONEX PENNKINETIC ER) 10-8 MG/5ML SUER, Take 5 mLs by mouth every 12 (twelve) hours as needed for cough., Disp: 140 mL, Rfl: 0 .  cyanocobalamin 500 MCG tablet, Take 1,000 mcg by mouth daily., Disp: , Rfl:  .  DULoxetine (CYMBALTA) 20 MG capsule, Take 20 mg by mouth daily., Disp: , Rfl:  .  furosemide (LASIX) 40 MG tablet, Take 1 tablet (40 mg total) by mouth daily., Disp: 5 tablet, Rfl: 0 .  Nintedanib (OFEV) 150 MG CAPS, Take 150 mg by mouth 2 (two) times daily., Disp: 60 capsule, Rfl:  .  oxymetazoline (AFRIN) 0.05 % nasal spray, Place 1 spray into both nostrils as needed for congestion., Disp: , Rfl:  .  pramipexole (MIRAPEX) 0.5 MG tablet, TAKE 1 TABLET BY MOUTH EVERY MORNING AND TAKE 1 TABLET EVERY EVENING, Disp: 180 tablet, Rfl: 3 .  PROAIR HFA 108 (90 BASE) MCG/ACT inhaler, Inhale 2 puffs into the lungs every 6 (six) hours as needed., Disp: , Rfl:  .  QUEtiapine (SEROQUEL) 100 MG tablet, , Disp: , Rfl:  .  triamcinolone cream (KENALOG) 0.1 %, APPLY 1 APPLICATION ON THE SKIN TWICE A DAY AS NEEDED, Disp: , Rfl: 2 .  doxycycline (VIBRA-TABS) 100 MG tablet, Take 1 tablet (100 mg total) by mouth 2 (two) times daily., Disp: 10 tablet, Rfl: 0 .  furosemide (LASIX) 20 MG tablet, Take 1 tab daily prn in addition to 40mg  to keep weight between 195-196lbs, Disp: 30 tablet, Rfl: 1 .  sodium chloride HYPERTONIC 3 % nebulizer solution, Take by nebulization 2 (two) times daily as needed for other., Disp: 224 mL, Rfl: 12

## 2017-06-20 NOTE — Telephone Encounter (Signed)
I have called the pt and made him aware of the meds not covered by [employer]---the pt requested that these be sent in to [facility]. This has been done and nothing further is needed

## 2017-06-24 ENCOUNTER — Encounter (HOSPITAL_COMMUNITY): Admission: RE | Admit: 2017-06-24 | Payer: Medicare Other | Source: Ambulatory Visit

## 2017-06-26 ENCOUNTER — Encounter (HOSPITAL_COMMUNITY): Admission: RE | Admit: 2017-06-26 | Payer: Medicare Other | Source: Ambulatory Visit

## 2017-06-27 ENCOUNTER — Telehealth: Payer: Self-pay | Admitting: Pulmonary Disease

## 2017-06-27 MED ORDER — SODIUM CHLORIDE 3 % IN NEBU: INHALATION_SOLUTION | Freq: Two times a day (BID) | RESPIRATORY_TRACT | 12 refills | Status: DC | PRN

## 2017-06-27 NOTE — Telephone Encounter (Signed)
Called and spoke with pt and he is aware of rx that has been sent to the pharmacy and nothing further is needed.

## 2017-07-01 ENCOUNTER — Encounter (HOSPITAL_COMMUNITY)
Admission: RE | Admit: 2017-07-01 | Discharge: 2017-07-01 | Disposition: A | Discharge status: Home / Self Care | Payer: Medicare Other | Source: Ambulatory Visit | Attending: Pulmonary Disease | Admitting: Pulmonary Disease

## 2017-07-01 DIAGNOSIS — J849 Interstitial pulmonary disease, unspecified: Secondary | ICD-10-CM

## 2017-07-03 ENCOUNTER — Encounter (HOSPITAL_COMMUNITY)
Admission: RE | Admit: 2017-07-03 | Discharge: 2017-07-03 | Disposition: A | Discharge status: Home / Self Care | Payer: Medicare Other | Source: Ambulatory Visit | Attending: Pulmonary Disease | Admitting: Pulmonary Disease

## 2017-07-03 VITALS — Wt 203.7 lb

## 2017-07-03 DIAGNOSIS — J849 Interstitial pulmonary disease, unspecified: Secondary | ICD-10-CM | POA: Diagnosis present

## 2017-07-03 NOTE — Progress Notes (Signed)
Daily Session Note  Patient Details  Name: Jeffrey Cobb MRN: [id number] Date of Birth: 1941/04/25 Referring Provider:     Pulmonary Rehab Walk Test from 04/29/2017 in [place]  Referring Provider  Dr. [name]      Encounter Date: 07/03/2017  Check In: Session Check In - 07/03/17 1015      Check-In   Location  [facility]    Staff Present  Su Hilt, MS, ACSM RCEP, Exercise Physiologist;Portia Rollene Rotunda, RN, BSN;Lisa Ysidro Evert, RN;Joan Leonia Reeves, RN, BSN    Supervising physician immediately available to respond to emergencies  [facility] immediately available    Physician(s)  Dr. Quincy Simmonds    Medication changes reported      No    Fall or balance concerns reported     No    Tobacco Cessation  No Change    Warm-up and Cool-down  Performed as group-led instruction    Resistance Training Performed  Yes    VAD Patient?  No      Pain Assessment   Currently in Pain?  No/denies    Multiple Pain Sites  No       Capillary Blood Glucose: No results found for this or any previous visit (from the past 24 hour(s)).  Exercise Prescription Changes - 07/03/17 1300      Response to Exercise   Blood Pressure (Admit)  132/70    Blood Pressure (Exercise)  118/80    Blood Pressure (Exit)  112/62    Heart Rate (Admit)  83 bpm    Heart Rate (Exercise)  99 bpm    Heart Rate (Exit)  86 bpm    Oxygen Saturation (Admit)  96 %    Oxygen Saturation (Exercise)  92 %    Oxygen Saturation (Exit)  98 %    Rating of Perceived Exertion (Exercise)  10    Perceived Dyspnea (Exercise)  0    Duration  Continue with 45 min of aerobic exercise without signs/symptoms of physical distress.    Intensity  THRR unchanged      Progression   Progression  Continue to progress workloads to maintain intensity without signs/symptoms of physical distress.      Resistance Training   Training Prescription  Yes    Weight  blue bands    Reps  10-15    Time  10  Minutes      Interval Training   Interval Training  No      Oxygen   Oxygen  Continuous    Liters  10      Bike   Level  1    Minutes  17      NuStep   Level  5    Minutes  17    METs  2.3       Social History   Tobacco Use  Smoking Status Former Smoker  . Packs/day: 3.00  . Years: 17.00  . Pack years: 51.00  . Types: Cigarettes  . Last attempt to quit: 08/26/1977  . Years since quitting: 39.8  Smokeless Tobacco Never Used    Goals Met:  Exercise tolerated well No report of cardiac concerns or symptoms Strength training completed today  Goals Unmet:  Not Applicable  Comments: Service time is from 10:30a to 12:30p    Dr. Rush Farmer is Medical Director for Pulmonary Rehab at [facility].

## 2017-07-08 ENCOUNTER — Encounter (HOSPITAL_COMMUNITY)
Admission: RE | Admit: 2017-07-08 | Discharge: 2017-07-08 | Disposition: A | Discharge status: Home / Self Care | Payer: Medicare Other | Source: Ambulatory Visit | Attending: Pulmonary Disease | Admitting: Pulmonary Disease

## 2017-07-08 VITALS — Wt 205.9 lb

## 2017-07-08 DIAGNOSIS — J849 Interstitial pulmonary disease, unspecified: Secondary | ICD-10-CM | POA: Diagnosis not present

## 2017-07-08 NOTE — Progress Notes (Signed)
Daily Session Note  Patient Details  Name: Jeffrey Cobb MRN: [id number] Date of Birth: June 02, 1941 Referring Provider:     Pulmonary Rehab Walk Test from 04/29/2017 in [place]  Referring Provider  Dr. [name]      Encounter Date: 07/08/2017  Check In: Session Check In - 07/08/17 1235      Check-In   Location  [facility]    Staff Present  Su Hilt, MS, ACSM RCEP, Exercise Physiologist;Joan Leonia Reeves, RN, Luisa Hart, RN, Roque Cash, RN    Supervising physician immediately available to respond to emergencies  [facility] immediately available    Physician(s)  Dr. Maylene Roes    Medication changes reported      No    Fall or balance concerns reported     No    Tobacco Cessation  No Change    Warm-up and Cool-down  Performed as group-led instruction    Resistance Training Performed  Yes    VAD Patient?  No      Pain Assessment   Currently in Pain?  No/denies    Multiple Pain Sites  No       Capillary Blood Glucose: No results found for this or any previous visit (from the past 24 hour(s)).  Exercise Prescription Changes - 07/08/17 1200      Response to Exercise   Blood Pressure (Admit)  110/76    Blood Pressure (Exercise)  126/70    Blood Pressure (Exit)  108/76    Heart Rate (Admit)  84 bpm    Heart Rate (Exercise)  107 bpm    Heart Rate (Exit)  98 bpm    Oxygen Saturation (Admit)  96 %    Oxygen Saturation (Exercise)  86 % oxygen increased to 15L sat improved to 90%   oxygen increased to 15L sat improved to 90%   Oxygen Saturation (Exit)  98 %    Rating of Perceived Exertion (Exercise)  9    Perceived Dyspnea (Exercise)  1    Duration  Continue with 45 min of aerobic exercise without signs/symptoms of physical distress.    Intensity  THRR unchanged      Progression   Progression  Continue to progress workloads to maintain intensity without signs/symptoms of physical distress.      Resistance  Training   Training Prescription  Yes    Weight  blue bands    Reps  10-15    Time  10 Minutes      Interval Training   Interval Training  No      Oxygen   Oxygen  Continuous    Liters  10-15      Treadmill   MPH  1.8    Grade  0    Minutes  17      Bike   Level  1    Minutes  17      NuStep   Level  5    Minutes  17    METs  2       Social History   Tobacco Use  Smoking Status Former Smoker  . Packs/day: 3.00  . Years: 17.00  . Pack years: 51.00  . Types: Cigarettes  . Last attempt to quit: 08/26/1977  . Years since quitting: 39.8  Smokeless Tobacco Never Used    Goals Met:  Exercise tolerated well No report of cardiac concerns or symptoms Strength training completed today  Goals Unmet:  Not Applicable  Comments: Service time is from 1030 to 1215    Dr. Rush Farmer is Medical Director for Pulmonary Rehab at [facility].

## 2017-07-10 ENCOUNTER — Other Ambulatory Visit: Payer: Self-pay

## 2017-07-10 ENCOUNTER — Encounter (HOSPITAL_COMMUNITY): Payer: Medicare Other

## 2017-07-10 DIAGNOSIS — J84112 Idiopathic pulmonary fibrosis: Secondary | ICD-10-CM

## 2017-07-10 DIAGNOSIS — J9611 Chronic respiratory failure with hypoxia: Secondary | ICD-10-CM

## 2017-07-10 NOTE — Progress Notes (Signed)
I have reviewed a Home Exercise Prescription with Jeffrey Cobb . [name] is currently exercising at home.  The patient was advised to walk 3 days a week for 30 minutes.  [name] and I discussed how to progress their exercise prescription.  The patient stated that their goals were to be able to do more with the oxygen and increase walking endurance.  The patient stated that they understand the exercise prescription.  We reviewed exercise guidelines, target heart rate during exercise, oxygen use, weather, home pulse oximeter, endpoints for exercise, and goals.  Patient is encouraged to come to me with any questions. I will continue to follow up with the patient to assist them with progression and safety.

## 2017-07-10 NOTE — Progress Notes (Signed)
Pulmonary Individual Treatment Plan  Patient Details  Name: Jeffrey Cobb MRN: [id number] Date of Birth: [birth year] Referring Provider:     Pulmonary Rehab Walk Test from 04/29/2017 in [place]  Referring Provider  Dr. [name]      Initial Encounter Date:    Pulmonary Rehab Walk Test from 04/29/2017 in [place]  Date  04/29/17  Referring Provider  Dr. [name]      Visit Diagnosis: IPF  Patient's Home Medications on Admission:   Current Outpatient Medications:  .  albuterol (PROVENTIL) (2.5 MG/3ML) 0.083% nebulizer solution, Take 3 mLs (2.5 mg total) by nebulization every 6 (six) hours as needed for wheezing or shortness of breath., Disp: 360 mL, Rfl: 11 .  amLODipine (NORVASC) 2.5 MG tablet, Take 2.5 mg by mouth daily., Disp: , Rfl:  .  aspirin 81 MG tablet, Take 81 mg by mouth daily., Disp: , Rfl:  .  atorvastatin (LIPITOR) 40 MG tablet, Take 1 tablet (40 mg total) by mouth daily at 6 PM., Disp: 30 tablet, Rfl: 0 .  chlorpheniramine-HYDROcodone (TUSSIONEX PENNKINETIC ER) 10-8 MG/5ML SUER, Take 5 mLs by mouth every 12 (twelve) hours as needed for cough., Disp: 140 mL, Rfl: 0 .  cyanocobalamin 500 MCG tablet, Take 1,000 mcg by mouth daily., Disp: , Rfl:  .  doxycycline (VIBRA-TABS) 100 MG tablet, Take 1 tablet (100 mg total) by mouth 2 (two) times daily., Disp: 10 tablet, Rfl: 0 .  DULoxetine (CYMBALTA) 20 MG capsule, Take 20 mg by mouth daily., Disp: , Rfl:  .  furosemide (LASIX) 20 MG tablet, Take 1 tab daily prn in addition to 39m to keep weight between 195-196lbs, Disp: 30 tablet, Rfl: 1 .  furosemide (LASIX) 40 MG tablet, Take 1 tablet (40 mg total) by mouth daily., Disp: 5 tablet, Rfl: 0 .  Nintedanib (OFEV) 150 MG CAPS, Take 150 mg by mouth 2 (two) times daily., Disp: 60 capsule, Rfl:  .  oxymetazoline (AFRIN) 0.05 % nasal spray, Place 1 spray into both nostrils as needed for congestion., Disp: , Rfl:  .   pramipexole (MIRAPEX) 0.5 MG tablet, TAKE 1 TABLET BY MOUTH EVERY MORNING AND TAKE 1 TABLET EVERY EVENING, Disp: 180 tablet, Rfl: 3 .  PROAIR HFA 108 (90 BASE) MCG/ACT inhaler, Inhale 2 puffs into the lungs every 6 (six) hours as needed., Disp: , Rfl:  .  QUEtiapine (SEROQUEL) 100 MG tablet, , Disp: , Rfl:  .  sodium chloride HYPERTONIC 3 % nebulizer solution, Take by nebulization 2 (two) times daily as needed for other., Disp: 224 mL, Rfl: 12 .  triamcinolone cream (KENALOG) 0.1 %, APPLY 1 APPLICATION ON THE SKIN TWICE A DAY AS NEEDED, Disp: , Rfl: 2  Past Medical History: Past Medical History:  Diagnosis Date  . Acute sinusitis, unspecified   . Arthritis   . 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 GERD (gastroesophageal reflux disease)   . Left anterior fascicular block    hx of on ekg  . Mixed hyperlipidemia   . Obstructive chronic bronchitis with exacerbation ([facility]   . Occlusion and stenosis of carotid artery without mention of cerebral infarction    a. 05/2013 Carotid U/S: 1-39% bilat stenosis.  . Other emphysema ([facility]   . 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 Sleep apnea    does not use cpap due to cough  . Stroke ([facility]  05/2015    Tobacco Use: Social History   Tobacco Use  Smoking Status Former Smoker  . Packs/day: 3.00  . Years: 17.00  . Pack years: 51.00  . Types: Cigarettes  . Last attempt to quit: 08/26/1977  . Years since quitting: 39.8  Smokeless Tobacco Never Used    Labs: Recent Chemical engineer    Labs for ITP Cardiac and Pulmonary Rehab Latest Ref Rng & Units 10/27/2007 11/01/2008 01/17/2011 06/01/2015   Cholestrol 0 - 200 mg/dL 175 169 165 170   LDLCALC 0 - 99 mg/dL 108(H) 105(H) 90 103(H)   HDL >40 mg/dL 57.8 52.5 67.40 58   Trlycerides <150 mg/dL 44 59 40.0 46   Hemoglobin A1c 4.8 - 5.6 % - - - 5.7(H)      Capillary Blood Glucose: No results found for: GLUCAP   Pulmonary  Assessment Scores: Pulmonary Assessment Scores    Row Name 02/27/17 1537         ADL UCSD   ADL Phase  Exit     SOB Score total  35       CAT Score   CAT Score  15 Exit        Pulmonary Function Assessment: Pulmonary Function Assessment - 04/25/17 1543      Breath   Bilateral Breath Sounds  -- clear upper fine crackles lower    Shortness of Breath  Yes;Limiting activity       Exercise Target Goals:    Exercise Program Goal: Individual exercise prescription set with THRR, safety & activity barriers. Participant demonstrates ability to understand and report RPE using BORG scale, to self-measure pulse accurately, and to acknowledge the importance of the exercise prescription.  Exercise Prescription Goal: Starting with aerobic activity 30 plus minutes a day, 3 days per week for initial exercise prescription. Provide home exercise prescription and guidelines that participant acknowledges understanding prior to discharge.  Activity Barriers & Risk Stratification: Activity Barriers & Cardiac Risk Stratification - 04/25/17 1512      Activity Barriers & Cardiac Risk Stratification   Activity Barriers  Joint Problems;Arthritis;Back Problems;Deconditioning;Shortness of Breath;Left Knee Replacement;Right Knee Replacement;Neck/Spine Problems       6 Minute Walk: 6 Minute Walk    Row Name 03/04/17 1228 04/29/17 1619       6 Minute Walk   Phase  Discharge  Discharge    Distance  1231 feet  1300 feet    Distance % Change  0.08 %  -    Walk Time  6 minutes  6 minutes    # of Rest Breaks  0  0    MPH  2.33  2.46    METS  2.76  2.84    RPE  11  11    Perceived Dyspnea   1  1    Symptoms  No  Yes (comment)    Comments  -  USED WHEELCHAIR    Resting HR  98 bpm  101 bpm    Resting BP  122/70  150/92    Resting Oxygen Saturation   -  95 %    Exercise Oxygen Saturation  during 6 min walk  -  87 %    Max Ex. HR  160 bpm  118 bpm    Max Ex. BP  138/78  155/92    2 Minute Post BP   112/76  -      Interval HR   Baseline HR (retired)  98  -  1 Minute HR  108  101    2 Minute HR  112  104    3 Minute HR  117  118    4 Minute HR  123  -    5 Minute HR  153  115    6 Minute HR  160  112    2 Minute Post HR  108  111    Interval Heart Rate?  Yes  Yes      Interval Oxygen   Interval Oxygen?  Yes  Yes    Baseline Oxygen Saturation %  98 %  95 %    Resting Liters of Oxygen  8 L  -    1 Minute Oxygen Saturation %  92 %  93 %    1 Minute Liters of Oxygen  8 L  6 L    2 Minute Oxygen Saturation %  87 %  87 %    2 Minute Liters of Oxygen  8 L  6 L    3 Minute Oxygen Saturation %  86 %  86 %    3 Minute Liters of Oxygen  8 L  8 L    4 Minute Oxygen Saturation %  86 %  87 %    4 Minute Liters of Oxygen  10 L  8 L    5 Minute Oxygen Saturation %  88 %  87 %    5 Minute Liters of Oxygen  10 L  8 L    6 Minute Oxygen Saturation %  88 %  87 %    6 Minute Liters of Oxygen  10 L  8 L    2 Minute Post Oxygen Saturation %  93 %  90 %    2 Minute Post Liters of Oxygen  10 L  8 L       Oxygen Initial Assessment: Oxygen Initial Assessment - 04/29/17 1629      Initial 6 min Walk   Oxygen Used  Continuous;E-Tanks    Liters per minute  8      Program Oxygen Prescription   Program Oxygen Prescription  Continuous;E-Tanks    Liters per minute  8       Oxygen Re-Evaluation: Oxygen Re-Evaluation    Row Name 01/30/17 1301 02/25/17 1549 05/16/17 1127 06/13/17 1115 07/07/17 1023     Program Oxygen Prescription   Program Oxygen Prescription  Continuous  Continuous  Continuous;E-Tanks  Continuous;E-Tanks  Continuous;E-Tanks   Liters per minute  6  6  10  10  10    Comments  -  he has had an increase to 8 lpm when walking the track to maintain an oxygen saturation >88-90%  he has had to increase to 10 lpm while walking on the treadmill to maintain oxygen saturations greater than 88  -  has progressed to needing 10 lpm with all exercises     Home Oxygen   Home Oxygen Device   Home Concentrator;E-Tanks  Home Concentrator;E-Tanks  Home Concentrator;E-Tanks;Portable Concentrator  Home Concentrator;E-Tanks;Portable Concentrator  Home Concentrator;E-Tanks;Portable Concentrator   Sleep Oxygen Prescription  None  None  CPAP currently working physician to identify possible need for overnight oximetry to determine need for nighttime oxygen bleedin to cpap  CPAP  CPAP   Liters per minute  -  -  -  -  4   Home Exercise Oxygen Prescription  Continuous  Continuous  Continuous working to get new home concentrator that will accomodate 10lpm continuous flow  Continuous order placed this week for DME to replace patients home concentrator with one that will accomodate 10lpm  Continuous   Liters per minute  4  4  10  while walking on his treadmill  10  10   Home at Rest Exercise Oxygen Prescription  None  None  None  Continuous  Continuous   Liters per minute  -  -  -  2  4   Compliance with Home Oxygen Use  Yes  Yes  Yes  Yes  Yes     Goals/Expected Outcomes   Short Term Goals  To learn and exhibit compliance with exercise, home and travel O2 prescription;To learn and understand importance of monitoring SPO2 with pulse oximeter and demonstrate accurate use of the pulse oximeter.;To Learn and understand importance of maintaining oxygen saturations>88%;To learn and demonstrate proper purse lipped breathing techniques or other breathing techniques.;To learn and demonstrate proper use of respiratory medications  To learn and exhibit compliance with exercise, home and travel O2 prescription;To learn and understand importance of monitoring SPO2 with pulse oximeter and demonstrate accurate use of the pulse oximeter.;To Learn and understand importance of maintaining oxygen saturations>88%;To learn and demonstrate proper purse lipped breathing techniques or other breathing techniques.;To learn and demonstrate proper use of respiratory medications  To learn and exhibit compliance with exercise, home  and travel O2 prescription;To learn and understand importance of monitoring SPO2 with pulse oximeter and demonstrate accurate use of the pulse oximeter.;To learn and understand importance of maintaining oxygen saturations>88%;To learn and demonstrate proper pursed lip breathing techniques or other breathing techniques.  To learn and exhibit compliance with exercise, home and travel O2 prescription;To learn and understand importance of monitoring SPO2 with pulse oximeter and demonstrate accurate use of the pulse oximeter.;To learn and understand importance of maintaining oxygen saturations>88%;To learn and demonstrate proper pursed lip breathing techniques or other breathing techniques.  To learn and exhibit compliance with exercise, home and travel O2 prescription;To learn and understand importance of monitoring SPO2 with pulse oximeter and demonstrate accurate use of the pulse oximeter.;To learn and understand importance of maintaining oxygen saturations>88%;To learn and demonstrate proper pursed lip breathing techniques or other breathing techniques.   Long  Term Goals  Exhibits compliance with exercise, home and travel O2 prescription;Maintenance of O2 saturations>88%;Compliance with respiratory medication  Exhibits compliance with exercise, home and travel O2 prescription;Maintenance of O2 saturations>88%;Compliance with respiratory medication  Exhibits compliance with exercise, home and travel O2 prescription;Maintenance of O2 saturations>88%;Compliance with respiratory medication;Verbalizes importance of monitoring SPO2 with pulse oximeter and return demonstration;Exhibits proper breathing techniques, such as pursed lip breathing or other method taught during program session  Exhibits compliance with exercise, home and travel O2 prescription;Maintenance of O2 saturations>88%;Compliance with respiratory medication;Verbalizes importance of monitoring SPO2 with pulse oximeter and return  demonstration;Exhibits proper breathing techniques, such as pursed lip breathing or other method taught during program session  Exhibits compliance with exercise, home and travel O2 prescription;Maintenance of O2 saturations>88%;Compliance with respiratory medication;Verbalizes importance of monitoring SPO2 with pulse oximeter and return demonstration;Exhibits proper breathing techniques, such as pursed lip breathing or other method taught during program session   Comments  patient verbalizing compliance with home O2. Patient observed wearing his home o2 into rehab.  patient verbalizing compliance with home O2. Patient observed wearing his home o2 into rehab. he is worried about his increased need for more oxygen as his workloads increase.  patient verbalizes compliance with home o2 however barriers to correct use have been identified. Patient does not  have adequate oxygen systems at home to maintain oxygen during sleep and with exercise  barriers have been addressed this week by pulmonologist and orders are in place for new high flow home concentrator and o2 bleed in with CPAP  patient has recieved new high flow home concentrator and oxygen bleedin for his CPAP   Goals/Expected Outcomes  -  -  patient will have adequate home oxygen delivery systems to accomodate life needs.  patient will have adequate home oxygen delivery systems to accomodate life needs.  -   Row Name 07/08/17 1613             Program Oxygen Prescription   Comments  had to progress to 15 lpm with treadmill walking at most recent exercise session for desaturation to 86% on 10 liters          Oxygen Discharge (Final Oxygen Re-Evaluation): Oxygen Re-Evaluation - 07/08/17 1613      Program Oxygen Prescription   Comments  had to progress to 15 lpm with treadmill walking at most recent exercise session for desaturation to 86% on 10 liters       Initial Exercise Prescription: Initial Exercise Prescription - 04/29/17 1600       Date of Initial Exercise RX and Referring Provider   Date  04/29/17    Referring Provider  Dr. [name]      Oxygen   Oxygen  Continuous    Liters  8      Treadmill   MPH  1.8    Grade  0    Minutes  17      Bike   Level  1    Minutes  17      NuStep   Level  5    Minutes  17    METs  2      Prescription Details   Frequency (times per week)  2    Duration  Progress to 45 minutes of aerobic exercise without signs/symptoms of physical distress      Intensity   THRR 40-80% of Max Heartrate  58-115    Ratings of Perceived Exertion  11-13    Perceived Dyspnea  0-4      Progression   Progression  Continue progressive overload as per policy without signs/symptoms or physical distress.      Resistance Training   Training Prescription  Yes    Weight  Blue bands    Reps  10-15       Perform Capillary Blood Glucose checks as needed.  Exercise Prescription Changes: Exercise Prescription Changes    Row Name 01/16/17 1200 01/21/17 1218 01/23/17 1200 01/28/17 1200 01/30/17 1200     Response to Exercise   Blood Pressure (Admit)  126/88  124/64  146/80  120/64  136/70   Blood Pressure (Exercise)  118/68  132/74  120/86  122/70  140/80   Blood Pressure (Exit)  98/64  108/60  116/64  124/74  104/60   Heart Rate (Admit)  91 bpm  83 bpm  82 bpm  92 bpm  88 bpm   Heart Rate (Exercise)  98 bpm  100 bpm  109 bpm  106 bpm  110 bpm   Heart Rate (Exit)  87 bpm  93 bpm  88 bpm  92 bpm  96 bpm   Oxygen Saturation (Admit)  97 %  97 %  98 %  96 %  98 %   Oxygen Saturation (Exercise)  94 %  88 %  86 %  84 %  89 %   Oxygen Saturation (Exit)  97 %  90 %  88 %  89 %  98 %   Rating of Perceived Exertion (Exercise)  11  13  11  13  13    Perceived Dyspnea (Exercise)  0  0  1  1  1    Duration  Continue with 45 min of aerobic exercise without signs/symptoms of physical distress.  Continue with 45 min of aerobic exercise without signs/symptoms of physical distress.  Continue with 45 min of aerobic  exercise without signs/symptoms of physical distress.  Continue with 45 min of aerobic exercise without signs/symptoms of physical distress.  Continue with 45 min of aerobic exercise without signs/symptoms of physical distress.   Intensity  THRR unchanged  THRR unchanged  THRR unchanged  THRR unchanged  THRR unchanged     Progression   Progression  Continue to progress workloads to maintain intensity without signs/symptoms of physical distress.  Continue to progress workloads to maintain intensity without signs/symptoms of physical distress.  Continue to progress workloads to maintain intensity without signs/symptoms of physical distress.  Continue to progress workloads to maintain intensity without signs/symptoms of physical distress.  Continue to progress workloads to maintain intensity without signs/symptoms of physical distress.     Resistance Training   Training Prescription  Yes  Yes  Yes  Yes  Yes   Weight  blue bands  blue bands  blue bands  blue bands  blue bands   Reps  10-15  10-15  10-15  10-15  10-15   Time  10 Minutes  10 Minutes  10 Minutes  10 Minutes  10 Minutes     Interval Training   Interval Training  No  No  No  No  No     Oxygen   Oxygen  Continuous  Continuous  Continuous  Continuous  Continuous   Liters  4-5  4-5  4-5  4-5  4-5     Bike   Level  0.6  0.8  0.8  0.8  0.8   Minutes  17  17  17  17  17      NuStep   Level  5  5  -  5  5   Minutes  17  17  -  17  17   METs  2  1.9  -  2.3  2.1     Track   Laps  -  13  16  10  11    Minutes  -  17  17  17  17    Row Name 02/04/17 1100 02/06/17 1200 02/11/17 1200 02/13/17 1200 02/18/17 1200     Response to Exercise   Blood Pressure (Admit)  132/70  130/80  134/64  122/60  136/70   Blood Pressure (Exercise)  140/80  150/96  140/76  140/74  128/80   Blood Pressure (Exit)  118/60  120/64  118/60  130/86  132/82   Heart Rate (Admit)  88 bpm  92 bpm  86 bpm  90 bpm  73 bpm   Heart Rate (Exercise)  107 bpm  117 bpm  102  bpm  99 bpm  97 bpm   Heart Rate (Exit)  93 bpm  106 bpm  93 bpm  71 bpm  84 bpm   Oxygen Saturation (Admit)  94 %  92 %  97 %  87 %  98 %   Oxygen Saturation (Exercise)  89 %  86 %  89 %  89 %  89 %   Oxygen Saturation (Exit)  95 %  89 %  90 %  97 %  95 %   Rating of Perceived Exertion (Exercise)  11  11  13  11  9    Perceived Dyspnea (Exercise)  1  1  2  1  1    Duration  Continue with 45 min of aerobic exercise without signs/symptoms of physical distress.  Continue with 45 min of aerobic exercise without signs/symptoms of physical distress.  Continue with 45 min of aerobic exercise without signs/symptoms of physical distress.  Continue with 45 min of aerobic exercise without signs/symptoms of physical distress.  Continue with 45 min of aerobic exercise without signs/symptoms of physical distress.   Intensity  THRR unchanged  THRR unchanged  THRR unchanged  THRR unchanged  THRR unchanged     Progression   Progression  Continue to progress workloads to maintain intensity without signs/symptoms of physical distress.  Continue to progress workloads to maintain intensity without signs/symptoms of physical distress.  Continue to progress workloads to maintain intensity without signs/symptoms of physical distress.  Continue to progress workloads to maintain intensity without signs/symptoms of physical distress.  Continue to progress workloads to maintain intensity without signs/symptoms of physical distress.     Resistance Training   Training Prescription  Yes  Yes  Yes  Yes  Yes   Weight  blue bands  blue bands  blue bands  blue bands  blue bands   Reps  10-15  10-15  10-15  10-15  10-15   Time  10 Minutes  10 Minutes  10 Minutes  10 Minutes  10 Minutes     Interval Training   Interval Training  No  No  No  No  No     Oxygen   Oxygen  Continuous  Continuous  Continuous  Continuous  Continuous   Liters  4-5  4-5  4-5  4-5  4-5     Bike   Level  0.8  1  1  1  1    Minutes  17  17  17  17  17       NuStep   Level  6  -  6  -  6   Minutes  17  -  17  -  17   METs  2.3  -  2.4  -  2.6     Track   Laps  11  12  9  9  8    Minutes  17  17  17  17  17    Row Name 02/20/17 1200 02/25/17 1200 02/27/17 1200 05/06/17 1200 05/13/17 1600     Response to Exercise   Blood Pressure (Admit)  122/74  120/70  122/70  122/80  130/74   Blood Pressure (Exercise)  140/80  150/70  150/80  140/90  150/72   Blood Pressure (Exit)  128/84  100/70  114/70  118/72  110/64   Heart Rate (Admit)  88 bpm  93 bpm  92 bpm  84 bpm  78 bpm   Heart Rate (Exercise)  99 bpm  110 bpm  107 bpm  99 bpm  96 bpm   Heart Rate (Exit)  88 bpm  95 bpm  92 bpm  80 bpm  87 bpm   Oxygen Saturation (Admit)  93 %  96 %  98 %  93 %  92 %   Oxygen Saturation (Exercise)  89 %  83 % O2 increased to 8L sat improved to 87% on TM  89 %  86 %  86 %   Oxygen Saturation (Exit)  97 %  96 %  96 %  95 %  91 %   Rating of Perceived Exertion (Exercise)  13  13  11  11  13    Perceived Dyspnea (Exercise)  2  1  0  0  2   Duration  Continue with 45 min of aerobic exercise without signs/symptoms of physical distress.  Continue with 45 min of aerobic exercise without signs/symptoms of physical distress.  Continue with 45 min of aerobic exercise without signs/symptoms of physical distress.  Continue with 45 min of aerobic exercise without signs/symptoms of physical distress.  Continue with 45 min of aerobic exercise without signs/symptoms of physical distress.   Intensity  THRR unchanged  THRR unchanged  THRR unchanged  THRR unchanged  THRR unchanged     Progression   Progression  Continue to progress workloads to maintain intensity without signs/symptoms of physical distress.  Continue to progress workloads to maintain intensity without signs/symptoms of physical distress.  Continue to progress workloads to maintain intensity without signs/symptoms of physical distress.  Continue to progress workloads to maintain intensity without signs/symptoms of  physical distress.  Continue to progress workloads to maintain intensity without signs/symptoms of physical distress.     Resistance Training   Training Prescription  Yes  Yes  Yes  Yes  Yes   Weight  blue bands  blue bands  blue bands  blue bands  blue bands   Reps  10-15  10-15  10-15  10-15  10-15   Time  10 Minutes  10 Minutes  10 Minutes  10 Minutes  10 Minutes     Interval Training   Interval Training  No  No  No  No  No     Oxygen   Oxygen  Continuous  Continuous  Continuous  Continuous  Continuous   Liters  8   8  8  8  8      Treadmill   MPH  -  1.8  1.8  1.8  1.8   Grade  -  0  0  0  0   Minutes  -  17  17  17  17      Bike   Level  -  1  1.2  -  1   Minutes  -  17  17  -  17     NuStep   Level  7  7  -  5  3   Minutes  17  17  -  17  17   METs  2.4  2.6  -  2.1  2.1     Track   Laps  9  -  -  -  -   Minutes  17  -  -  -  -   Row Name 05/15/17 1200 05/20/17 1229 05/22/17 1200 05/27/17 1200 06/03/17 1200     Response to Exercise   Blood Pressure (Admit)  118/68  120/74  120/70  140/70  146/80   Blood Pressure (Exercise)  142/82  132/88  156/82  132/66  118/70   Blood Pressure (Exit)  118/60  104/72  114/70  118/68  120/80   Heart Rate (Admit)  80 bpm  81 bpm  89 bpm  88 bpm  99 bpm   Heart Rate (Exercise)  88  bpm  99 bpm  104 bpm  100 bpm  112 bpm   Heart Rate (Exit)  78 bpm  83 bpm  81 bpm  80 bpm  91 bpm   Oxygen Saturation (Admit)  92 %  90 %  91 %  98 %  99 %   Oxygen Saturation (Exercise)  88 %  86 %  87 %  88 %  87 %   Oxygen Saturation (Exit)  99 %  99 %  98 %  99 %  98 %   Rating of Perceived Exertion (Exercise)  9  11  13  11  11    Perceived Dyspnea (Exercise)  0  1  1  1  1    Duration  Continue with 45 min of aerobic exercise without signs/symptoms of physical distress.  Continue with 45 min of aerobic exercise without signs/symptoms of physical distress.  Continue with 45 min of aerobic exercise without signs/symptoms of physical distress.  Continue  with 45 min of aerobic exercise without signs/symptoms of physical distress.  Continue with 45 min of aerobic exercise without signs/symptoms of physical distress.   Intensity  THRR unchanged  THRR unchanged  THRR unchanged  THRR unchanged  THRR unchanged     Progression   Progression  Continue to progress workloads to maintain intensity without signs/symptoms of physical distress.  Continue to progress workloads to maintain intensity without signs/symptoms of physical distress.  Continue to progress workloads to maintain intensity without signs/symptoms of physical distress.  Continue to progress workloads to maintain intensity without signs/symptoms of physical distress.  Continue to progress workloads to maintain intensity without signs/symptoms of physical distress.     Resistance Training   Training Prescription  Yes  Yes  Yes  Yes  Yes   Weight  blue bands  blue bands  blue bands  blue bands  blue bands   Reps  10-15  10-15  10-15  10-15  10-15   Time  10 Minutes  10 Minutes  10 Minutes  10 Minutes  10 Minutes     Interval Training   Interval Training  No  No  No  No  No     Oxygen   Oxygen  Continuous  Continuous  Continuous  Continuous  Continuous   Liters  8  10  10  10  10      Treadmill   MPH  1.8  1.8  1.8  1.8  1.8   Grade  0  0  0  0  0   Minutes  17  17  17  17  17      Bike   Level  -  1  -  1  -   Minutes  -  17  -  17  -     NuStep   Level  5  5  5  3  workload decreased not feeling well today  5   Minutes  17  17  17  17  17    METs  2.1  2.4  2.3  2.3  2.4   Row Name 06/19/17 1200 07/03/17 1300 07/08/17 1200         Response to Exercise   Blood Pressure (Admit)  138/80  132/70  110/76     Blood Pressure (Exercise)  144/80  118/80  126/70     Blood Pressure (Exit)  122/80  112/62  108/76     Heart Rate (Admit)  77 bpm  83  bpm  84 bpm     Heart Rate (Exercise)  102 bpm  99 bpm  107 bpm     Heart Rate (Exit)  80 bpm  86 bpm  98 bpm     Oxygen Saturation (Admit)   95 %  96 %  96 %     Oxygen Saturation (Exercise)  84 %  92 %  86 % oxygen increased to 15L sat improved to 90%     Oxygen Saturation (Exit)  99 %  98 %  98 %     Rating of Perceived Exertion (Exercise)  13  10  9      Perceived Dyspnea (Exercise)  1  0  1     Duration  Continue with 45 min of aerobic exercise without signs/symptoms of physical distress.  Continue with 45 min of aerobic exercise without signs/symptoms of physical distress.  Continue with 45 min of aerobic exercise without signs/symptoms of physical distress.     Intensity  THRR unchanged  THRR unchanged  THRR unchanged       Progression   Progression  Continue to progress workloads to maintain intensity without signs/symptoms of physical distress.  Continue to progress workloads to maintain intensity without signs/symptoms of physical distress.  Continue to progress workloads to maintain intensity without signs/symptoms of physical distress.       Resistance Training   Training Prescription  Yes  Yes  Yes     Weight  blue bands  blue bands  blue bands     Reps  10-15  10-15  10-15     Time  10 Minutes  10 Minutes  10 Minutes       Interval Training   Interval Training  No  No  No       Oxygen   Oxygen  Continuous  Continuous  Continuous     Liters  10  10  10-15       Treadmill   MPH  1.8  -  1.8     Grade  0  -  0     Minutes  17  -  17       Bike   Level  -  1  1     Minutes  -  17  17       NuStep   Level  5  5  5      Minutes  17  17  17      METs  2.4  2.3  2       Home Exercise Plan   Plans to continue exercise at  -  -  Home (comment)     Frequency  -  -  Add 3 additional days to program exercise sessions.        Exercise Comments: Exercise Comments    Row Name 07/10/17 0745           Exercise Comments  Home exercise completed          Exercise Goals and Review: Exercise Goals    Row Name 04/25/17 1514             Exercise Goals   Increase Physical Activity  Yes       Intervention   Provide advice, education, support and counseling about physical activity/exercise needs.;Develop an individualized exercise prescription for aerobic and resistive training based on initial evaluation findings, risk stratification, comorbidities and participant's personal goals.       Expected Outcomes  Achievement of increased cardiorespiratory fitness and enhanced flexibility, muscular endurance and strength shown through measurements of functional capacity and personal statement of participant.       Increase Strength and Stamina  Yes       Intervention  Provide advice, education, support and counseling about physical activity/exercise needs.;Develop an individualized exercise prescription for aerobic and resistive training based on initial evaluation findings, risk stratification, comorbidities and participant's personal goals.       Expected Outcomes  Achievement of increased cardiorespiratory fitness and enhanced flexibility, muscular endurance and strength shown through measurements of functional capacity and personal statement of participant.       Able to understand and use rate of perceived exertion (RPE) scale  Yes       Intervention  Provide education and explanation on how to use RPE scale       Expected Outcomes  Short Term: Able to use RPE daily in rehab to express subjective intensity level;Long Term:  Able to use RPE to guide intensity level when exercising independently       Able to understand and use Dyspnea scale  Yes       Intervention  Provide education and explanation on how to use Dyspnea scale       Expected Outcomes  Short Term: Able to use Dyspnea scale daily in rehab to express subjective sense of shortness of breath during exertion;Long Term: Able to use Dyspnea scale to guide intensity level when exercising independently       Knowledge and understanding of Target Heart Rate Range (THRR)  Yes       Intervention  Provide education and explanation of THRR including how the  numbers were predicted and where they are located for reference       Expected Outcomes  Short Term: Able to state/look up THRR;Long Term: Able to use THRR to govern intensity when exercising independently;Short Term: Able to use daily as guideline for intensity in rehab       Understanding of Exercise Prescription  Yes       Intervention  Provide education, explanation, and written materials on patient's individual exercise prescription       Expected Outcomes  Short Term: Able to explain program exercise prescription;Long Term: Able to explain home exercise prescription to exercise independently          Exercise Goals Re-Evaluation : Exercise Goals Re-Evaluation    Row Name 01/27/17 1027 02/25/17 1556 05/12/17 1335 06/14/17 1406 07/10/17 1615     Exercise Goal Re-Evaluation   Exercise Goals Review  Increase Physical Activity;Increase Strenth and Stamina  Increase Strenth and Stamina;Increase Physical Activity  Increase Strength and Stamina;Able to understand and use Dyspnea scale;Able to understand and use rate of perceived exertion (RPE) scale;Increase Physical Activity;Knowledge and understanding of Target Heart Rate Range (THRR);Understanding of Exercise Prescription  Increase Physical Activity;Increase Strength and Stamina;Able to understand and use Dyspnea scale;Able to understand and use rate of perceived exertion (RPE) scale;Knowledge and understanding of Target Heart Rate Range (THRR);Understanding of Exercise Prescription  Increase Physical Activity;Increase Strength and Stamina;Able to understand and use Dyspnea scale;Able to understand and use rate of perceived exertion (RPE) scale;Knowledge and understanding of Target Heart Rate Range (THRR);Understanding of Exercise Prescription   Comments  Patient has had slow start due to bouts of hypertension and shoulder injury from fall. Will cont. to monitor and progress as appropriate.   Patient has advanced to using the treadmill. MET average  ranges from 2.4-2.6. Home exercise reviewed. Will  cont to monitor and progress.   Patient has only attended one exercise session. Will cont. to monitor and progress.   Patient is struggling with exercise progression due to disease progression. METs range in the "low" level. Patient has purchased a treadmill and is going to start exercising at home. Will cont. to monitor and progress as able.   Patient is struggling with exercise progression due to disease progression. METs range in the "low" level. Has treadmill at home and has started his home exercise routine. Will cont. to monitor and progress as able.    Expected Outcomes  Through the exercise here at rehab the patient with increase physical capacity, strength, and stamina.  Through the exercise here at rehab the patient with increase physical capacity, strength, and stamina.  Through the exercise here at rehab the patient with increase physical capacity, strength, and stamina.  Through the exercise here at rehab the patient with increase physical capacity, strength, and stamina.  Through exercise at rehab and at home, patient will increase strength and stamina making ADL's easier to perform. Patient will also have a better understanding of safe exercise and what they are capable to do outside of clinical supervision.      Discharge Exercise Prescription (Final Exercise Prescription Changes): Exercise Prescription Changes - 07/08/17 1200      Response to Exercise   Blood Pressure (Admit)  110/76    Blood Pressure (Exercise)  126/70    Blood Pressure (Exit)  108/76    Heart Rate (Admit)  84 bpm    Heart Rate (Exercise)  107 bpm    Heart Rate (Exit)  98 bpm    Oxygen Saturation (Admit)  96 %    Oxygen Saturation (Exercise)  86 % oxygen increased to 15L sat improved to 90%    Oxygen Saturation (Exit)  98 %    Rating of Perceived Exertion (Exercise)  9    Perceived Dyspnea (Exercise)  1    Duration  Continue with 45 min of aerobic exercise  without signs/symptoms of physical distress.    Intensity  THRR unchanged      Progression   Progression  Continue to progress workloads to maintain intensity without signs/symptoms of physical distress.      Resistance Training   Training Prescription  Yes    Weight  blue bands    Reps  10-15    Time  10 Minutes      Interval Training   Interval Training  No      Oxygen   Oxygen  Continuous    Liters  10-15      Treadmill   MPH  1.8    Grade  0    Minutes  17      Bike   Level  1    Minutes  17      NuStep   Level  5    Minutes  17    METs  2      Home Exercise Plan   Plans to continue exercise at  Home (comment)    Frequency  Add 3 additional days to program exercise sessions.       Nutrition:  Target Goals: Understanding of nutrition guidelines, daily intake of sodium <15108m, cholesterol <2040m calories 30% from fat and 7% or less from saturated fats, daily to have 5 or more servings of fruits and vegetables.  Biometrics: Barbie Haggis 03/04/17 [place]  Height  5' 6"  (1.676 m)    Weight  210 lb 8.6 oz (95.5 kg)    BMI (Calculated)  34.1    Grip Strength  15 kg    Flexibility  35.5 in       Nutrition Therapy Plan and Nutrition Goals: Nutrition Therapy & Goals - 06/19/17 1323      Nutrition Therapy   Diet  Therapeutic Lifestyle Changes      Personal Nutrition Goals   Nutrition Goal  Wt loss of 1-2 lb/week to a wt loss goal of 6-24 lb at graduation from Pulmonary Rehab    Personal Goal #2  Pt to increase fish consumption to at least 1-2 times/week    Personal Goal #3  Pt to increase consumption of fruits and vegetables from 0-1 cup per day to 2-3 cups per day.    Personal Goal #4  Pt to use nuts/peanut butter as a small snack or protein choice 2 times/week or more.      Intervention Plan   Intervention  Prescribe, educate and counsel regarding individualized specific dietary modifications aiming towards targeted core  components such as weight, hypertension, lipid management, diabetes, heart failure and other comorbidities.    Expected Outcomes  Short Term Goal: Understand basic principles of dietary content, such as calories, fat, sodium, cholesterol and nutrients.;Long Term Goal: Adherence to prescribed nutrition plan.       Nutrition Discharge: Rate Your Plate Scores: Nutrition Assessments - 06/19/17 1325      Rate Your Plate Scores   Pre Score  53       Nutrition Goals Re-Evaluation: Nutrition Goals Re-Evaluation    Row Name 03/13/17 0717 05/12/17 1028           Goals   Current Weight  210 lb 8.6 oz (95.5 kg)  210 lb 5.1 oz (95.4 kg)      Nutrition Goal  -  Wt loss of 1-2 lb/week to a wt loss goal of 6-24 lb at graduation from Pulmonary Rehab      Comment  Pt wt is down 2 lb since admission. Wt loss goal wt not achieved. Pt has been limiting food sources of sodium and now always/usually compares and chooses lower sodium food options.   Pt has maintained his wt while in rehab. Wt loss goal wt not achieved. Pt has been limiting food sources of sodium and now usually compares and chooses lower sodium food options.         Personal Goal #2 Re-Evaluation   Personal Goal #2  -  Identify and limit food sources of sodium         Nutrition Goals Discharge (Final Nutrition Goals Re-Evaluation): Nutrition Goals Re-Evaluation - 05/12/17 1028      Goals   Current Weight  210 lb 5.1 oz (95.4 kg)    Nutrition Goal  Wt loss of 1-2 lb/week to a wt loss goal of 6-24 lb at graduation from Pulmonary Rehab    Comment  Pt has maintained his wt while in rehab. Wt loss goal wt not achieved. Pt has been limiting food sources of sodium and now usually compares and chooses lower sodium food options.       Personal Goal #2 Re-Evaluation   Personal Goal #2  Identify and limit food sources of sodium       Psychosocial: Target Goals: Acknowledge presence or absence of significant depression and/or stress,  maximize coping skills, provide positive support system. Participant is able to verbalize types  and ability to use techniques and skills needed for reducing stress and depression.  Initial Review & Psychosocial Screening: Initial Psych Review & Screening - 04/25/17 1547      Initial Review   Current issues with  None Identified    Source of Stress Concerns  None Identified      Family Dynamics   Good Support System?  Yes      Barriers   Psychosocial barriers to participate in program  There are no identifiable barriers or psychosocial needs.       Quality of Life Scores:   PHQ-9: Recent Review Flowsheet Data    Depression screen [facility] 2/9 04/25/2017 03/04/2017 11/15/2016   Decreased Interest 0 0 0   Down, Depressed, Hopeless 0 0 0   PHQ - 2 Score 0 0 0     Interpretation of Total Score  Total Score Depression Severity:  1-4 = Minimal depression, 5-9 = Mild depression, 10-14 = Moderate depression, 15-19 = Moderately severe depression, 20-27 = Severe depression   Psychosocial Evaluation and Intervention: Psychosocial Evaluation - 04/25/17 1548      Psychosocial Evaluation & Interventions   Interventions  Encouraged to exercise with the program and follow exercise prescription    Continue Psychosocial Services   No Follow up required       Psychosocial Re-Evaluation: Psychosocial Re-Evaluation    [place] Name 01/30/17 1304 05/16/17 1134 06/13/17 1132 07/07/17 1037       Psychosocial Re-Evaluation   Current issues with  -  Current Sleep Concerns  Current Sleep Concerns;Current Depression;History of Depression;Current Stress Concerns  Current Sleep Concerns;Current Depression;History of Depression;Current Stress Concerns    Comments  patient more acceptance of hypertension diagnosis now that he sees his medication is working and his BP is controlled  patient has a habit of waking at the same time every night and migrating to the kitchen and eating sweets. he has had behavior  modification therapy in the past with success. he is incoraged to revisit need for therapy. this is keeping patient from getting a full nights rest and causing him to continue to gain weight.  patient has been prescribed antidepressant this week by pulmonologist. Will address stress of illness, depression, and sleep with patient over then next 4-6 weeks as medication takes effect  patient states he is beginning to feel an improvement in his mood, probably as a result of the antidepressant he has been placed on. He also states he is beginning to sleep through the night with only 1 awakening since wearing his CPAP with oxygen.    Expected Outcomes  through education and seeing improvement of his hypertension with medications patient will express resolved concern for hypertension  patient will verbalize behavoir modification stratagies to help with getting up and eating with nighttime awakening  patient will verbalize behavoir modification stratagies to help with getting up and eating with nighttime awakening and will verbalize he has more energy and enjoyment in everyday activities  patient beginning to use behavior modification stratagies that help with his eating sugary snacks with nighttime awakenings.    Interventions  Encouraged to attend Pulmonary Rehabilitation for the exercise  Encouraged to attend Pulmonary Rehabilitation for the exercise  Encouraged to attend Pulmonary Rehabilitation for the exercise  Encouraged to attend Pulmonary Rehabilitation for the exercise    Continue Psychosocial Services   Follow up required by staff  Follow up required by staff  Follow up required by staff folllow up required by MD  Follow up required  by staff    Comments  -  -  worsening of chronic illness  -      Initial Review   Source of Stress Concerns  Chronic Illness;Unable to participate in former interests or hobbies;Unable to perform yard/household activities  None Identified  Chronic Illness  -        Psychosocial Discharge (Final Psychosocial Re-Evaluation): Psychosocial Re-Evaluation - 07/07/17 1037      Psychosocial Re-Evaluation   Current issues with  Current Sleep Concerns;Current Depression;History of Depression;Current Stress Concerns    Comments  patient states he is beginning to feel an improvement in his mood, probably as a result of the antidepressant he has been placed on. He also states he is beginning to sleep through the night with only 1 awakening since wearing his CPAP with oxygen.    Expected Outcomes  patient beginning to use behavior modification stratagies that help with his eating sugary snacks with nighttime awakenings.    Interventions  Encouraged to attend Pulmonary Rehabilitation for the exercise    Continue Psychosocial Services   Follow up required by staff       Education: Education Goals: Education classes will be provided on a weekly basis, covering required topics. Participant will state understanding/return demonstration of topics presented.  Learning Barriers/Preferences:   Education Topics: Risk Factor Reduction:  -Group instruction that is supported by a PowerPoint presentation. Instructor discusses the definition of a risk factor, different risk factors for pulmonary disease, and how the heart and lungs work together.     Nutrition for Pulmonary Patient:  -Group instruction provided by PowerPoint slides, verbal discussion, and written materials to support subject matter. The instructor gives an explanation and review of healthy diet recommendations, which includes a discussion on weight management, recommendations for fruit and vegetable consumption, as well as protein, fluid, caffeine, fiber, sodium, sugar, and alcohol. Tips for eating when patients are short of breath are discussed.   PULMONARY REHAB OTHER RESPIRATORY from 07/03/2017 in [place]  Date  02/13/17  Educator  RD  Instruction Review Code  2- meets  goals/outcomes      Pursed Lip Breathing:  -Group instruction that is supported by demonstration and informational handouts. Instructor discusses the benefits of pursed lip and diaphragmatic breathing and detailed demonstration on how to preform both.     Oxygen Safety:  -Group instruction provided by PowerPoint, verbal discussion, and written material to support subject matter. There is an overview of "What is Oxygen" and "Why do we need it".  Instructor also reviews how to create a safe environment for oxygen use, the importance of using oxygen as prescribed, and the risks of noncompliance. There is a brief discussion on traveling with oxygen and resources the patient may utilize.   PULMONARY REHAB OTHER RESPIRATORY from 07/03/2017 in [place]  Date  06/19/17  Educator  Rhiannan Kievit  Instruction Review Code  2- meets goals/outcomes      Oxygen Equipment:  -Group instruction provided by [employer] Staff utilizing handouts, written materials, and equipment demonstrations.   PULMONARY REHAB OTHER RESPIRATORY from 07/03/2017 in [place]  Date  01/16/17  Educator  George[facility]  Instruction Review Code  2- meets goals/outcomes      Signs and Symptoms:  -Group instruction provided by written material and verbal discussion to support subject matter. Warning signs and symptoms of infection, stroke, and heart attack are reviewed and when to call the physician/911 reinforced. Tips  for preventing the spread of infection discussed.   PULMONARY REHAB OTHER RESPIRATORY from 07/03/2017 in [place]  Date  05/15/17  Educator  rn  Instruction Review Code  2- meets goals/outcomes      Advanced Directives:  -Group instruction provided by verbal instruction and written material to support subject matter. Instructor reviews Advanced Directive laws and proper instruction for filling out  document.   Pulmonary Video:  -Group video education that reviews the importance of medication and oxygen compliance, exercise, good nutrition, pulmonary hygiene, and pursed lip and diaphragmatic breathing for the pulmonary patient.   PULMONARY REHAB OTHER RESPIRATORY from 07/03/2017 in [place]  Date  05/22/17  Instruction Review Code  2- meets goals/outcomes      Exercise for the Pulmonary Patient:  -Group instruction that is supported by a PowerPoint presentation. Instructor discusses benefits of exercise, core components of exercise, frequency, duration, and intensity of an exercise routine, importance of utilizing pulse oximetry during exercise, safety while exercising, and options of places to exercise outside of rehab.     PULMONARY REHAB OTHER RESPIRATORY from 07/03/2017 in [place]  Date  05/06/17  Educator  ep  Instruction Review Code  R- Review/reinforce      Pulmonary Medications:  -Verbally interactive group education provided by instructor with focus on inhaled medications and proper administration.   PULMONARY REHAB OTHER RESPIRATORY from 07/03/2017 in [place]  Date  01/09/17  Educator  Pharm  Instruction Review Code  2- meets goals/outcomes      Anatomy and Physiology of the Respiratory System and Intimacy:  -Group instruction provided by PowerPoint, verbal discussion, and written material to support subject matter. Instructor reviews respiratory cycle and anatomical components of the respiratory system and their functions. Instructor also reviews differences in obstructive and restrictive respiratory diseases with examples of each. Intimacy, Sex, and Sexuality differences are reviewed with a discussion on how relationships can change when diagnosed with pulmonary disease. Common sexual concerns are reviewed.   MD DAY -A group question and answer session with a medical  doctor that allows participants to ask questions that relate to their pulmonary disease state.   PULMONARY REHAB OTHER RESPIRATORY from 07/03/2017 in [place]  Date  06/03/17  Educator  yacoub  Instruction Review Code  2- meets goals/outcomes      OTHER EDUCATION -Group or individual verbal, written, or video instructions that support the educational goals of the pulmonary rehab program.   PULMONARY REHAB OTHER RESPIRATORY from 07/03/2017 in [place]  Date  07/03/17 [[id number] Eating]  Educator  RD  Instruction Review Code  1- Verbalizes Understanding      Knowledge Questionnaire Score: Knowledge Questionnaire Score - 02/27/17 1537      Knowledge Questionnaire Score   Post Score  11/13       Core Components/Risk Factors/Patient Goals at Admission: Personal Goals and Risk Factors at Admission - 04/25/17 1546      Core Components/Risk Factors/Patient Goals on Admission   Improve shortness of breath with ADL's  Yes    Intervention  Provide education, individualized exercise plan and daily activity instruction to help decrease symptoms of SOB with activities of daily living.    Expected Outcomes  Short Term: Achieves a reduction of symptoms when performing activities of daily living.    Hypertension  Yes    Intervention  Provide education  on lifestyle modifcations including regular physical activity/exercise, weight management, moderate sodium restriction and increased consumption of fresh fruit, vegetables, and low fat dairy, alcohol moderation, and smoking cessation.;Monitor prescription use compliance.    Expected Outcomes  Short Term: Continued assessment and intervention until BP is < 140/29m HG in hypertensive participants. < 130/833mHG in hypertensive participants with diabetes, heart failure or chronic kidney disease.;Long Term: Maintenance of blood pressure at goal levels.       Core Components/Risk  Factors/Patient Goals Review:  Goals and Risk Factor Review    Row Name 01/30/17 1304 02/25/17 1551 05/16/17 1132 06/13/17 1120 07/07/17 1028     Core Components/Risk Factors/Patient Goals Review   Personal Goals Review  Weight Management/Obesity;Improve shortness of breath with ADL's;Develop more efficient breathing techniques such as purse lipped breathing and diaphragmatic breathing and practicing self-pacing with activity.;Hypertension;Stress  Weight Management/Obesity;Improve shortness of breath with ADL's;Develop more efficient breathing techniques such as purse lipped breathing and diaphragmatic breathing and practicing self-pacing with activity.;Hypertension;Stress  Develop more efficient breathing techniques such as purse lipped breathing and diaphragmatic breathing and practicing self-pacing with activity.;Hypertension;Stress;Weight Management/Obesity;Improve shortness of breath with ADL's;Increase knowledge of respiratory medications and ability to use respiratory devices properly.  Develop more efficient breathing techniques such as purse lipped breathing and diaphragmatic breathing and practicing self-pacing with activity.;Hypertension;Stress;Weight Management/Obesity;Improve shortness of breath with ADL's;Increase knowledge of respiratory medications and ability to use respiratory devices properly.  Develop more efficient breathing techniques such as purse lipped breathing and diaphragmatic breathing and practicing self-pacing with activity.;Hypertension;Stress;Weight Management/Obesity;Improve shortness of breath with ADL's;Increase knowledge of respiratory medications and ability to use respiratory devices properly.   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 his wife states the patient is more aware of the "unhealthy" things he eats. he utilizes PLB and pacing however he sometimes feels he cant get enough air through his nose. I have worked with patient on taking in  larger breaths through his mouth and blowing out through pursed lips. This technique makes him feel more satisfied with air consumption and decreases the anxiety he has when he feels he cant get enough air through his nose. he has learned to pace himself so that he can tolerate workload increases. his hypertension has resolved with medication. 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 he admits to not following through with his discharge home exercise plan and by being sedentary for the last several months patient has become deconditioned. It is too early in this admission to evaluate progress towards current program and personal goals.  Patient is attempting to be more compliant with activity prescribed at home. He voices feeling "worse" on a daily basis and has seen his pulmonologist this week for a full work-up. His BP remains high at times, his SOB is worsening and his oxygen requirements are increasing. Will have a better idea of treatment plan goals once all test results are reviewed by md.  patient has accepted his need for weekly exercise and is walking on his treadmill as prescribed. He is attempting to cut back on his "unhealthy" eating in order to loose weight. He verbalizes watching his sugar intake as it relates to inflammation. 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 His resting BP prior to exercise ranges from 120s-140s and during exertion 115-150s.   Expected Outcomes  see admission expected outcomes  see admission expected outcomes  see  admission expected outcomes  see admission expected outcomes  see admission expected outcomes      Core Components/Risk Factors/Patient Goals at Discharge (Final Review):  Goals and Risk Factor Review - 07/07/17 1028      Core Components/Risk Factors/Patient Goals Review   Personal Goals Review  Develop more efficient breathing techniques such as purse lipped breathing  and diaphragmatic breathing and practicing self-pacing with activity.;Hypertension;Stress;Weight Management/Obesity;Improve shortness of breath with ADL's;Increase knowledge of respiratory medications and ability to use respiratory devices properly.    Review  patient has accepted his need for weekly exercise and is walking on his treadmill as prescribed. He is attempting to cut back on his "unhealthy" eating in order to loose weight. He verbalizes watching his sugar intake as it relates to inflammation. 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 His resting BP prior to exercise ranges from 120s-140s and during exertion 115-150s.    Expected Outcomes  see admission expected outcomes       ITP Comments:   Comments: ITP REVIEW Pt is making slow progress toward pulmonary rehab goals after completing 10 sessions. Recommend continued exercise, life style modification, education, and utilization of breathing techniques to increase stamina and strength and decrease shortness of breath with exertion.

## 2017-07-15 ENCOUNTER — Telehealth (HOSPITAL_COMMUNITY): Payer: Self-pay | Admitting: Family Medicine

## 2017-07-15 ENCOUNTER — Encounter (HOSPITAL_COMMUNITY)
Admission: RE | Admit: 2017-07-15 | Discharge: 2017-07-15 | Disposition: A | Discharge status: Home / Self Care | Payer: Medicare Other | Source: Ambulatory Visit | Attending: Pulmonary Disease | Admitting: Pulmonary Disease

## 2017-07-22 ENCOUNTER — Encounter (HOSPITAL_COMMUNITY)
Admission: RE | Admit: 2017-07-22 | Discharge: 2017-07-22 | Disposition: A | Discharge status: Home / Self Care | Payer: Medicare Other | Source: Ambulatory Visit | Attending: Pulmonary Disease | Admitting: Pulmonary Disease

## 2017-07-22 VITALS — Wt 203.7 lb

## 2017-07-22 DIAGNOSIS — J849 Interstitial pulmonary disease, unspecified: Secondary | ICD-10-CM | POA: Diagnosis not present

## 2017-07-22 NOTE — Progress Notes (Signed)
Daily Session Note  Patient Details  Name: Jeffrey Cobb MRN: [id number] Date of Birth: 27-Nov-1940 Referring Provider:     Pulmonary Rehab Walk Test from 04/29/2017 in [place]  Referring Provider  Dr. [name]      Encounter Date: 07/22/2017  Check In: Session Check In - 07/22/17 1030      Check-In   Location  [facility]    Staff Present  Rosebud Poles, RN, BSN;Marty Sadlowski, MS, ACSM RCEP, Exercise Physiologist;Lisa Ysidro Evert, RN;Portia Rollene Rotunda, RN, BSN    Supervising physician immediately available to respond to emergencies  [facility] immediately available    Physician(s)  Dr. Maylene Roes    Medication changes reported      No    Fall or balance concerns reported     No    Tobacco Cessation  No Change    Warm-up and Cool-down  Performed as group-led instruction    Resistance Training Performed  Yes    VAD Patient?  No      Pain Assessment   Currently in Pain?  No/denies    Multiple Pain Sites  No       Capillary Blood Glucose: No results found for this or any previous visit (from the past 24 hour(s)).  Exercise Prescription Changes - 07/22/17 1200      Response to Exercise   Blood Pressure (Admit)  150/80    Blood Pressure (Exercise)  140/84    Blood Pressure (Exit)  100/72    Heart Rate (Admit)  90 bpm    Heart Rate (Exercise)  107 bpm    Heart Rate (Exit)  90 bpm    Oxygen Saturation (Admit)  97 %    Oxygen Saturation (Exercise)  87 % oxygen increased to 15L sat improved to 90%    Oxygen Saturation (Exit)  90 %    Rating of Perceived Exertion (Exercise)  11    Perceived Dyspnea (Exercise)  1    Duration  Continue with 45 min of aerobic exercise without signs/symptoms of physical distress.    Intensity  THRR unchanged      Progression   Progression  Continue to progress workloads to maintain intensity without signs/symptoms of physical distress.      Resistance Training   Training Prescription  Yes    Weight  blue bands    Reps  10-15    Time  10 Minutes      Interval Training   Interval Training  No      Oxygen   Oxygen  Continuous    Liters  10-15      Treadmill   MPH  1.8    Grade  0    Minutes  17      Bike   Level  1    Minutes  17      NuStep   Level  6    Minutes  17    METs  2.5       Social History   Tobacco Use  Smoking Status Former Smoker  . Packs/day: 3.00  . Years: 17.00  . Pack years: 51.00  . Types: Cigarettes  . Last attempt to quit: 08/26/1977  . Years since quitting: 39.9  Smokeless Tobacco Never Used    Goals Met:  Exercise tolerated well No report of cardiac concerns or symptoms Strength training completed today  Goals Unmet:  Not Applicable  Comments: Service time is from 10:30a to 12:15p  Dr. Rush Farmer is Medical Director for Pulmonary Rehab at [facility].

## 2017-07-24 ENCOUNTER — Encounter (HOSPITAL_COMMUNITY)
Admission: RE | Admit: 2017-07-24 | Discharge: 2017-07-24 | Disposition: A | Discharge status: Home / Self Care | Payer: Medicare Other | Source: Ambulatory Visit | Attending: Pulmonary Disease | Admitting: Pulmonary Disease

## 2017-07-24 ENCOUNTER — Ambulatory Visit: Payer: Medicare Other | Admitting: Pulmonary Disease

## 2017-07-24 VITALS — Wt 203.5 lb

## 2017-07-24 DIAGNOSIS — J849 Interstitial pulmonary disease, unspecified: Secondary | ICD-10-CM

## 2017-07-24 NOTE — Progress Notes (Signed)
Daily Session Note  Patient Details  Name: Jeffrey Cobb MRN: [id number] Date of Birth: 12-24-1940 Referring Provider:     Pulmonary Rehab Walk Test from 04/29/2017 in [place]  Referring Provider  Dr. [name]      Encounter Date: 07/24/2017  Check In: Session Check In - 07/24/17 1028      Check-In   Location  [facility]    Staff Present  Rosebud Poles, RN, BSN;Alleene Stoy, MS, ACSM RCEP, Exercise Physiologist;Lisa Ysidro Evert, RN;Portia Rollene Rotunda, RN, BSN    Supervising physician immediately available to respond to emergencies  [facility] immediately available    Physician(s)  Dr. Nevada Crane    Medication changes reported      No    Fall or balance concerns reported     No    Tobacco Cessation  No Change    Warm-up and Cool-down  Performed as group-led instruction    Resistance Training Performed  Yes    VAD Patient?  No      Pain Assessment   Multiple Pain Sites  No       Capillary Blood Glucose: No results found for this or any previous visit (from the past 24 hour(s)).  Exercise Prescription Changes - 07/24/17 1300      Response to Exercise   Blood Pressure (Admit)  140/90    Blood Pressure (Exercise)  144/56    Blood Pressure (Exit)  112/74    Heart Rate (Admit)  83 bpm    Heart Rate (Exercise)  93 bpm    Heart Rate (Exit)  82 bpm    Oxygen Saturation (Admit)  94 %    Oxygen Saturation (Exercise)  83 % oxygen increased to 15L sat improved to 90%    Oxygen Saturation (Exit)  93 %    Rating of Perceived Exertion (Exercise)  11    Perceived Dyspnea (Exercise)  3    Duration  Continue with 45 min of aerobic exercise without signs/symptoms of physical distress.    Intensity  THRR unchanged      Progression   Progression  Continue to progress workloads to maintain intensity without signs/symptoms of physical distress.      Resistance Training   Training Prescription  Yes    Weight  blue bands    Reps  10-15    Time  10 Minutes      Interval Training   Interval Training  No      Oxygen   Oxygen  Continuous    Liters  10-15      Treadmill   MPH  1.9    Grade  1    Minutes  17      Bike   Level  0.5    Minutes  17       Social History   Tobacco Use  Smoking Status Former Smoker  . Packs/day: 3.00  . Years: 17.00  . Pack years: 51.00  . Types: Cigarettes  . Last attempt to quit: 08/26/1977  . Years since quitting: 39.9  Smokeless Tobacco Never Used    Goals Met:  Exercise tolerated well No report of cardiac concerns or symptoms Strength training completed today  Goals Unmet:  Not Applicable  Comments: Service time is from 10:30a to 12:35p    Dr. Rush Farmer is Medical Director for Pulmonary Rehab at [facility].

## 2017-07-24 NOTE — Progress Notes (Unsigned)
Subjective:    Patient ID: Jeffrey Cobb, male    DOB: 08-Jun-1941, 76 y.o.   MRN: [id number]  Synopsis: Former patient of Dr. 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   ***lastvisit more doxy, goal weight 195 with diuresis   Past Medical History:  Diagnosis Date  . Acute sinusitis, unspecified   . Arthritis   . 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 GERD (gastroesophageal reflux disease)   . Left anterior fascicular block    hx of on ekg  . Mixed hyperlipidemia   . Obstructive chronic bronchitis with exacerbation (HCC)   . Occlusion and stenosis of carotid artery without mention of cerebral infarction    a. 05/2013 Carotid U/S: 1-39% bilat stenosis.  . Other emphysema (HCC)   . 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 Sleep apnea    does not use cpap due to cough  . Stroke [facility](HCC) 05/2015      Review of Systems  Constitutional: Negative for chills, fatigue and fever.  HENT: Positive for rhinorrhea. Negative for sinus pressure and sneezing.   Respiratory: Positive for cough. Negative for shortness of breath and wheezing.   Cardiovascular: Negative for chest pain, palpitations and leg swelling.       Objective:   Physical Exam There were no vitals filed for this visit. 5L pm pulse  ***     CBC    Component Value Date/Time   WBC 6.7 05/31/2015 1730   RBC 4.64 05/31/2015 1730   HGB 14.6 05/31/2015 1730   HCT 41.5 05/31/2015 1730   PLT 138 (L) 05/31/2015 1730   MCV 89.4 05/31/2015 1730   MCH 31.5 05/31/2015 1730   MCHC 35.2 05/31/2015 1730   RDW 13.4 05/31/2015 1730   LYMPHSABS 2.2 09/02/2014 1204   MONOABS 0.7 09/02/2014 1204   EOSABS 0.2 09/02/2014 1204   BASOSABS 0.0 09/02/2014 1204      PFT PFT"s 2009:  FEV1 3.49 (115%), ratio 65, DLCO 67%. PFT's 08/2014:  FEV1 3.13 (114%), ratio 69, decreased airtrapping from 2009, DLCO 49% June 2017 pulmonary function testing FEV1 2.99 L (111% predicted, FVC 3.67 L (98% predicted), total lung capacity 5.53 L (85% predicted), DLCO 12.76 (45% predicted). March 2018 pulmonary function testing ratio 80%, FVC 2.94 L 79% predicted, total lung capacity 4.51 L 69% predicted, DLCO 9.05 31% predicted 05/2017 PFT> FVC 2.6L, DLCO 7.1 24% pred  Imaging: 01/2016 HRCT > traction bronchiolectasis and interlobular septal thickening in a basilar predominance have progressed compared to July 2016, there remains no frank honeycombing. There are multiple pulmonary nodules which are stable or have resolved. February 2018 high-resolution CT chest independently reviewed showing traction bronchiectasis, interlobular septal thickening and honeycombing worse in the periphery and bases.  6 minute walk test: August 2016 6 minute walk test 336 m O2 sats ration 92% on room air 04/27/2016 6 minute walk distance 292.5 m O2 sats ration and completion of test was 82% on room air. July 2018 6 minute walk: Distance 1231 feet, O2 saturation 88% on 10 L nasal cannula October 2018 distance 216 m  Other testing: September 2017 pH probe showed normal pH and no clear evidence of gastroesophageal reflux. Treated were 2018 echocardiogram  normal LVEF, RVSP 38  CPAP compliance report 02/2017> only using 39% of nights  Records from his visit with pulmonary rehabilitation reviewed, he's requiring up to 10 L continuous while working out on a treadmill.     Assessment & Plan:  No diagnosis found.  ***  Current Outpatient Medications:  .  albuterol (PROVENTIL) (2.5 MG/3ML) 0.083% nebulizer solution, Take 3 mLs (2.5 mg total) by nebulization every 6 (six) hours as needed for wheezing or shortness of breath., Disp: 360 mL, Rfl: 11 .  amLODipine (NORVASC) 2.5 MG tablet, Take 2.5  mg by mouth daily., Disp: , Rfl:  .  aspirin 81 MG tablet, Take 81 mg by mouth daily., Disp: , Rfl:  .  atorvastatin (LIPITOR) 40 MG tablet, Take 1 tablet (40 mg total) by mouth daily at 6 PM., Disp: 30 tablet, Rfl: 0 .  chlorpheniramine-HYDROcodone (TUSSIONEX PENNKINETIC ER) 10-8 MG/5ML SUER, Take 5 mLs by mouth every 12 (twelve) hours as needed for cough., Disp: 140 mL, Rfl: 0 .  cyanocobalamin 500 MCG tablet, Take 1,000 mcg by mouth daily., Disp: , Rfl:  .  doxycycline (VIBRA-TABS) 100 MG tablet, Take 1 tablet (100 mg total) by mouth 2 (two) times daily., Disp: 10 tablet, Rfl: 0 .  DULoxetine (CYMBALTA) 20 MG capsule, Take 20 mg by mouth daily., Disp: , Rfl:  .  furosemide (LASIX) 20 MG tablet, Take 1 tab daily prn in addition to 40mg  to keep weight between 195-196lbs, Disp: 30 tablet, Rfl: 1 .  furosemide (LASIX) 40 MG tablet, Take 1 tablet (40 mg total) by mouth daily., Disp: 5 tablet, Rfl: 0 .  Nintedanib (OFEV) 150 MG CAPS, Take 150 mg by mouth 2 (two) times daily., Disp: 60 capsule, Rfl:  .  oxymetazoline (AFRIN) 0.05 % nasal spray, Place 1 spray into both nostrils as needed for congestion., Disp: , Rfl:  .  pramipexole (MIRAPEX) 0.5 MG tablet, TAKE 1 TABLET BY MOUTH EVERY MORNING AND TAKE 1 TABLET EVERY EVENING, Disp: 180 tablet, Rfl: 3 .  PROAIR HFA 108 (90 BASE) MCG/ACT inhaler, Inhale 2 puffs into the lungs every 6 (six) hours as needed., Disp: , Rfl:  .  QUEtiapine (SEROQUEL) 100 MG tablet, , Disp: , Rfl:  .  sodium chloride HYPERTONIC 3 % nebulizer solution, Take by nebulization 2 (two) times daily as needed for other., Disp: 224 mL, Rfl: 12 .  triamcinolone cream (KENALOG) 0.1 %, APPLY 1 APPLICATION ON THE SKIN TWICE A DAY AS NEEDED, Disp: , Rfl: 2

## 2017-07-29 ENCOUNTER — Encounter (HOSPITAL_COMMUNITY)
Admission: RE | Admit: 2017-07-29 | Discharge: 2017-07-29 | Disposition: A | Discharge status: Home / Self Care | Payer: Medicare Other | Source: Ambulatory Visit | Attending: Pulmonary Disease | Admitting: Pulmonary Disease

## 2017-07-29 VITALS — Wt 204.8 lb

## 2017-07-29 DIAGNOSIS — J849 Interstitial pulmonary disease, unspecified: Secondary | ICD-10-CM | POA: Insufficient documentation

## 2017-07-29 NOTE — Progress Notes (Signed)
Daily Session Note  Patient Details  Name: Jeffrey Cobb MRN: [id number] Date of Birth: May 18, 1941 Referring Provider:     Pulmonary Rehab Walk Test from 04/29/2017 in [place]  Referring Provider  Dr. [name]      Encounter Date: 07/29/2017  Check In: Session Check In - 07/29/17 1030      Check-In   Location  [facility]    Staff Present  Rosebud Poles, RN, BSN;Molly diVincenzo, MS, ACSM RCEP, Exercise Physiologist;Lisa Ysidro Evert, RN;Portia Rollene Rotunda, RN, BSN    Supervising physician immediately available to respond to emergencies  [facility] immediately available    Physician(s)  Dr. Nevada Crane    Medication changes reported      No    Fall or balance concerns reported     No    Tobacco Cessation  No Change    Warm-up and Cool-down  Performed as group-led instruction    Resistance Training Performed  Yes    VAD Patient?  No      Pain Assessment   Currently in Pain?  No/denies    Multiple Pain Sites  No       Capillary Blood Glucose: No results found for this or any previous visit (from the past 24 hour(s)).  Exercise Prescription Changes - 07/29/17 1200      Response to Exercise   Blood Pressure (Admit)  130/90    Blood Pressure (Exercise)  130/70    Blood Pressure (Exit)  116/70    Heart Rate (Admit)  90 bpm    Heart Rate (Exercise)  109 bpm    Heart Rate (Exit)  99 bpm    Oxygen Saturation (Admit)  97 %    Oxygen Saturation (Exercise)  92 %    Oxygen Saturation (Exit)  98 %    Rating of Perceived Exertion (Exercise)  11    Perceived Dyspnea (Exercise)  1    Duration  Continue with 45 min of aerobic exercise without signs/symptoms of physical distress.    Intensity  THRR unchanged      Progression   Progression  Continue to progress workloads to maintain intensity without signs/symptoms of physical distress.      Resistance Training   Training Prescription  Yes    Weight  blue bands    Reps  10-15    Time  10  Minutes      Interval Training   Interval Training  No      Oxygen   Oxygen  Continuous    Liters  10-15      Treadmill   MPH  1.8    Grade  0    Minutes  17      Bike   Level  1    Minutes  17      NuStep   Level  6    Minutes  17    METs  2       Social History   Tobacco Use  Smoking Status Former Smoker  . Packs/day: 3.00  . Years: 17.00  . Pack years: 51.00  . Types: Cigarettes  . Last attempt to quit: 08/26/1977  . Years since quitting: 39.9  Smokeless Tobacco Never Used    Goals Met:  Exercise tolerated well Strength training completed today  Goals Unmet:  Not Applicable  Comments: Service time is from 1030 to 1205    Dr. Rush Farmer is Medical Director for Pulmonary Rehab at [facility].

## 2017-07-31 ENCOUNTER — Encounter (HOSPITAL_COMMUNITY)
Admission: RE | Admit: 2017-07-31 | Discharge: 2017-07-31 | Disposition: A | Discharge status: Home / Self Care | Payer: Medicare Other | Source: Ambulatory Visit | Attending: Pulmonary Disease | Admitting: Pulmonary Disease

## 2017-07-31 DIAGNOSIS — J849 Interstitial pulmonary disease, unspecified: Secondary | ICD-10-CM

## 2017-07-31 NOTE — Progress Notes (Signed)
Pulmonary Individual Treatment Plan  Patient Details  Name: Jeffrey Cobb MRN: [id number] Date of Birth: [birth year] Referring Provider:     Pulmonary Rehab Walk Test from 04/29/2017 in [facility]  Referring Provider  Dr. [name]      Initial Encounter Date:    Pulmonary Rehab Walk Test from 04/29/2017 in [facility]  Date  04/29/17  Referring Provider  Dr. [name]      Visit Diagnosis: ILD (interstitial lung disease) ([place])  Patient's Home Medications on Admission:   Current Outpatient Medications:  .  albuterol (PROVENTIL) (2.5 MG/3ML) 0.083% nebulizer solution, Take 3 mLs (2.5 mg total) by nebulization every 6 (six) hours as needed for wheezing or shortness of breath., Disp: 360 mL, Rfl: 11 .  amLODipine (NORVASC) 2.5 MG tablet, Take 2.5 mg by mouth daily., Disp: , Rfl:  .  aspirin 81 MG tablet, Take 81 mg by mouth daily., Disp: , Rfl:  .  atorvastatin (LIPITOR) 40 MG tablet, Take 1 tablet (40 mg total) by mouth daily at 6 PM., Disp: 30 tablet, Rfl: 0 .  chlorpheniramine-HYDROcodone (TUSSIONEX PENNKINETIC ER) 10-8 MG/5ML SUER, Take 5 mLs by mouth every 12 (twelve) hours as needed for cough., Disp: 140 mL, Rfl: 0 .  cyanocobalamin 500 MCG tablet, Take 1,000 mcg by mouth daily., Disp: , Rfl:  .  doxycycline (VIBRA-TABS) 100 MG tablet, Take 1 tablet (100 mg total) by mouth 2 (two) times daily., Disp: 10 tablet, Rfl: 0 .  DULoxetine (CYMBALTA) 20 MG capsule, Take 20 mg by mouth daily., Disp: , Rfl:  .  furosemide (LASIX) 20 MG tablet, Take 1 tab daily prn in addition to 29m to keep weight between 195-196lbs, Disp: 30 tablet, Rfl: 1 .  furosemide (LASIX) 40 MG tablet, Take 1 tablet (40 mg total) by mouth daily., Disp: 5 tablet, Rfl: 0 .  Nintedanib (OFEV) 150 MG CAPS, Take 150 mg by mouth 2 (two) times daily., Disp: 60 capsule, Rfl:  .  oxymetazoline (AFRIN) 0.05 % nasal spray, Place 1 spray into both nostrils as needed for  congestion., Disp: , Rfl:  .  pramipexole (MIRAPEX) 0.5 MG tablet, TAKE 1 TABLET BY MOUTH EVERY MORNING AND TAKE 1 TABLET EVERY EVENING, Disp: 180 tablet, Rfl: 3 .  PROAIR HFA 108 (90 BASE) MCG/ACT inhaler, Inhale 2 puffs into the lungs every 6 (six) hours as needed., Disp: , Rfl:  .  QUEtiapine (SEROQUEL) 100 MG tablet, , Disp: , Rfl:  .  sodium chloride HYPERTONIC 3 % nebulizer solution, Take by nebulization 2 (two) times daily as needed for other., Disp: 224 mL, Rfl: 12 .  triamcinolone cream (KENALOG) 0.1 %, APPLY 1 APPLICATION ON THE SKIN TWICE A DAY AS NEEDED, Disp: , Rfl: 2  Past Medical History: Past Medical History:  Diagnosis Date  . Acute sinusitis, unspecified   . Arthritis   . 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 GERD (gastroesophageal reflux disease)   . Left anterior fascicular block    hx of on ekg  . Mixed hyperlipidemia   . Obstructive chronic bronchitis with exacerbation ([facility]   . Occlusion and stenosis of carotid artery without mention of cerebral infarction    a. 05/2013 Carotid U/S: 1-39% bilat stenosis.  . Other emphysema ([facility]   . 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 Sleep apnea    does not use cpap due to cough  .  Stroke [facility]) 05/2015    Tobacco Use: Social History   Tobacco Use  Smoking Status Former Smoker  . Packs/day: 3.00  . Years: 17.00  . Pack years: 51.00  . Types: Cigarettes  . Last attempt to quit: 08/26/1977  . Years since quitting: 39.9  Smokeless Tobacco Never Used    Labs: Recent Chemical engineer    Labs for ITP Cardiac and Pulmonary Rehab Latest Ref Rng & Units 10/27/2007 11/01/2008 01/17/2011 06/01/2015   Cholestrol 0 - 200 mg/dL 175 169 165 170   LDLCALC 0 - 99 mg/dL 108(H) 105(H) 90 103(H)   HDL >40 mg/dL 57.8 52.5 67.40 58   Trlycerides <150 mg/dL 44 59 40.0 46   Hemoglobin A1c 4.8 - 5.6 % - - - 5.7(H)      Capillary Blood Glucose: No results  found for: GLUCAP   Pulmonary Assessment Scores: Pulmonary Assessment Scores    Row Name 02/27/17 1537         ADL UCSD   ADL Phase  Exit     SOB Score total  35       CAT Score   CAT Score  15 Exit        Pulmonary Function Assessment: Pulmonary Function Assessment - 04/25/17 1543      Breath   Bilateral Breath Sounds  -- clear upper fine crackles lower    Shortness of Breath  Yes;Limiting activity       Exercise Target Goals:    Exercise Program Goal: Individual exercise prescription set with THRR, safety & activity barriers. Participant demonstrates ability to understand and report RPE using BORG scale, to self-measure pulse accurately, and to acknowledge the importance of the exercise prescription.  Exercise Prescription Goal: Starting with aerobic activity 30 plus minutes a day, 3 days per week for initial exercise prescription. Provide home exercise prescription and guidelines that participant acknowledges understanding prior to discharge.  Activity Barriers & Risk Stratification: Activity Barriers & Cardiac Risk Stratification - 04/25/17 1512      Activity Barriers & Cardiac Risk Stratification   Activity Barriers  Joint Problems;Arthritis;Back Problems;Deconditioning;Shortness of Breath;Left Knee Replacement;Right Knee Replacement;Neck/Spine Problems       6 Minute Walk: 6 Minute Walk    Row Name 03/04/17 1228 04/29/17 1619       6 Minute Walk   Phase  Discharge  Discharge    Distance  1231 feet  1300 feet    Distance % Change  0.08 %  -    Walk Time  6 minutes  6 minutes    # of Rest Breaks  0  0    MPH  2.33  2.46    METS  2.76  2.84    RPE  11  11    Perceived Dyspnea   1  1    Symptoms  No  Yes (comment)    Comments  -  USED WHEELCHAIR    Resting HR  98 bpm  101 bpm    Resting BP  122/70  150/92    Resting Oxygen Saturation   -  95 %    Exercise Oxygen Saturation  during 6 min walk  -  87 %    Max Ex. HR  160 bpm  118 bpm    Max Ex. BP   138/78  155/92    2 Minute Post BP  112/76  -      Interval HR   Baseline HR (retired)  98  -  1 Minute HR  108  101    2 Minute HR  112  104    3 Minute HR  117  118    4 Minute HR  123  -    5 Minute HR  153  115    6 Minute HR  160  112    2 Minute Post HR  108  111    Interval Heart Rate?  Yes  Yes      Interval Oxygen   Interval Oxygen?  Yes  Yes    Baseline Oxygen Saturation %  98 %  95 %    Resting Liters of Oxygen  8 L  -    1 Minute Oxygen Saturation %  92 %  93 %    1 Minute Liters of Oxygen  8 L  6 L    2 Minute Oxygen Saturation %  87 %  87 %    2 Minute Liters of Oxygen  8 L  6 L    3 Minute Oxygen Saturation %  86 %  86 %    3 Minute Liters of Oxygen  8 L  8 L    4 Minute Oxygen Saturation %  86 %  87 %    4 Minute Liters of Oxygen  10 L  8 L    5 Minute Oxygen Saturation %  88 %  87 %    5 Minute Liters of Oxygen  10 L  8 L    6 Minute Oxygen Saturation %  88 %  87 %    6 Minute Liters of Oxygen  10 L  8 L    2 Minute Post Oxygen Saturation %  93 %  90 %    2 Minute Post Liters of Oxygen  10 L  8 L       Oxygen Initial Assessment: Oxygen Initial Assessment - 04/29/17 1629      Initial 6 min Walk   Oxygen Used  Continuous;E-Tanks    Liters per minute  8      Program Oxygen Prescription   Program Oxygen Prescription  Continuous;E-Tanks    Liters per minute  8       Oxygen Re-Evaluation: Oxygen Re-Evaluation    Row Name 02/25/17 1549 05/16/17 1127 06/13/17 1115 07/07/17 1023 07/08/17 1613     Program Oxygen Prescription   Program Oxygen Prescription  Continuous  Continuous;E-Tanks  Continuous;E-Tanks  Continuous;E-Tanks  -   Liters per minute  _0 -   Comments  he has had an increase to 8 lpm when walking the track to maintain an oxygen saturation >88-90%  he has had to increase to 10 lpm while walking on the treadmill to maintain oxygen saturations greater than 88  -  has progressed to needing 10 lpm with all exercises  had to progress  to 15 lpm with treadmill walking at most recent exercise session for desaturation to 86% on 10 liters     Home Oxygen   Home Oxygen Device  Home Concentrator;E-Tanks  Home Concentrator;E-Tanks;Portable Concentrator  Home Concentrator;E-Tanks;Portable Concentrator  Home Concentrator;E-Tanks;Portable Concentrator  -   Sleep Oxygen Prescription  None  CPAP currently working physician to identify possible need for overnight oximetry to determine need for nighttime oxygen bleedin to cpap  CPAP  CPAP  -   Liters per minute  -  -  -  4  -   Home Exercise Oxygen Prescription  Continuous  Continuous working to get new home concentrator that will accomodate 10lpm continuous flow  Continuous order placed this week for DME to replace patients home concentrator with one that will accomodate 10lpm  Continuous  -   Liters per minute  4  10 while walking on his treadmill  10  10  -   Home at Rest Exercise Oxygen Prescription  None  None  Continuous  Continuous  -   Liters per minute  -  -  2  4  -   Compliance with Home Oxygen Use  Yes  Yes  Yes  Yes  -     Goals/Expected Outcomes   Short Term Goals  To learn and exhibit compliance with exercise, home and travel O2 prescription;To learn and understand importance of monitoring SPO2 with pulse oximeter and demonstrate accurate use of the pulse oximeter.;To Learn and understand importance of maintaining oxygen saturations>88%;To learn and demonstrate proper purse lipped breathing techniques or other breathing techniques.;To learn and demonstrate proper use of respiratory medications  To learn and exhibit compliance with exercise, home and travel O2 prescription;To learn and understand importance of monitoring SPO2 with pulse oximeter and demonstrate accurate use of the pulse oximeter.;To learn and understand importance of maintaining oxygen saturations>88%;To learn and demonstrate proper pursed lip breathing techniques or other breathing techniques.  To learn and  exhibit compliance with exercise, home and travel O2 prescription;To learn and understand importance of monitoring SPO2 with pulse oximeter and demonstrate accurate use of the pulse oximeter.;To learn and understand importance of maintaining oxygen saturations>88%;To learn and demonstrate proper pursed lip breathing techniques or other breathing techniques.  To learn and exhibit compliance with exercise, home and travel O2 prescription;To learn and understand importance of monitoring SPO2 with pulse oximeter and demonstrate accurate use of the pulse oximeter.;To learn and understand importance of maintaining oxygen saturations>88%;To learn and demonstrate proper pursed lip breathing techniques or other breathing techniques.  -   Long  Term Goals  Exhibits compliance with exercise, home and travel O2 prescription;Maintenance of O2 saturations>88%;Compliance with respiratory medication  Exhibits compliance with exercise, home and travel O2 prescription;Maintenance of O2 saturations>88%;Compliance with respiratory medication;Verbalizes importance of monitoring SPO2 with pulse oximeter and return demonstration;Exhibits proper breathing techniques, such as pursed lip breathing or other method taught during program session  Exhibits compliance with exercise, home and travel O2 prescription;Maintenance of O2 saturations>88%;Compliance with respiratory medication;Verbalizes importance of monitoring SPO2 with pulse oximeter and return demonstration;Exhibits proper breathing techniques, such as pursed lip breathing or other method taught during program session  Exhibits compliance with exercise, home and travel O2 prescription;Maintenance of O2 saturations>88%;Compliance with respiratory medication;Verbalizes importance of monitoring SPO2 with pulse oximeter and return demonstration;Exhibits proper breathing techniques, such as pursed lip breathing or other method taught during program session  -   Comments  patient  verbalizing compliance with home O2. Patient observed wearing his home o2 into rehab. he is worried about his increased need for more oxygen as his workloads increase.  patient verbalizes compliance with home o2 however barriers to correct use have been identified. Patient does not have adequate oxygen systems at home to maintain oxygen during sleep and with exercise  barriers have been addressed this week by pulmonologist and orders are in place for new high flow home concentrator and o2 bleed in with CPAP  patient has recieved new high flow home concentrator and oxygen bleedin for his CPAP  -   Goals/Expected Outcomes  -  patient will have adequate  home oxygen delivery systems to accomodate life needs.  patient will have adequate home oxygen delivery systems to accomodate life needs.  -  -   Row Name 07/28/17 1738             Program Oxygen Prescription   Program Oxygen Prescription  Continuous;E-Tanks       Liters per minute  10       Comments  had to progress to 15 lpm with treadmill walking at most recent exercise session for desaturation to 86% on 10 liters         Home Oxygen   Home Oxygen Device  Home Concentrator;E-Tanks;Portable Concentrator       Sleep Oxygen Prescription  CPAP       Liters per minute  4       Home Exercise Oxygen Prescription  Continuous       Liters per minute  10       Home at Rest Exercise Oxygen Prescription  Continuous       Liters per minute  4       Compliance with Home Oxygen Use  Yes         Goals/Expected Outcomes   Short Term Goals  To learn and exhibit compliance with exercise, home and travel O2 prescription;To learn and understand importance of monitoring SPO2 with pulse oximeter and demonstrate accurate use of the pulse oximeter.;To learn and understand importance of maintaining oxygen saturations>88%;To learn and demonstrate proper pursed lip breathing techniques or other breathing techniques.       Long  Term Goals  Exhibits compliance with  exercise, home and travel O2 prescription;Maintenance of O2 saturations>88%;Compliance with respiratory medication;Verbalizes importance of monitoring SPO2 with pulse oximeter and return demonstration;Exhibits proper breathing techniques, such as pursed lip breathing or other method taught during program session       Comments  patient has adequate home oxygen to maintain healthy active social lifestyle       Goals/Expected Outcomes  Goal met          Oxygen Discharge (Final Oxygen Re-Evaluation): Oxygen Re-Evaluation - 07/28/17 1738      Program Oxygen Prescription   Program Oxygen Prescription  Continuous;E-Tanks    Liters per minute  10    Comments  had to progress to 15 lpm with treadmill walking at most recent exercise session for desaturation to 86% on 10 liters      Home Oxygen   Home Oxygen Device  Home Concentrator;E-Tanks;Portable Concentrator    Sleep Oxygen Prescription  CPAP    Liters per minute  4    Home Exercise Oxygen Prescription  Continuous    Liters per minute  10    Home at Rest Exercise Oxygen Prescription  Continuous    Liters per minute  4    Compliance with Home Oxygen Use  Yes      Goals/Expected Outcomes   Short Term Goals  To learn and exhibit compliance with exercise, home and travel O2 prescription;To learn and understand importance of monitoring SPO2 with pulse oximeter and demonstrate accurate use of the pulse oximeter.;To learn and understand importance of maintaining oxygen saturations>88%;To learn and demonstrate proper pursed lip breathing techniques or other breathing techniques.    Long  Term Goals  Exhibits compliance with exercise, home and travel O2 prescription;Maintenance of O2 saturations>88%;Compliance with respiratory medication;Verbalizes importance of monitoring SPO2 with pulse oximeter and return demonstration;Exhibits proper breathing techniques, such as pursed lip breathing or other method taught during program session  Comments   patient has adequate home oxygen to maintain healthy active social lifestyle    Goals/Expected Outcomes  Goal met       Initial Exercise Prescription: Initial Exercise Prescription - 04/29/17 1600      Date of Initial Exercise RX and Referring Provider   Date  04/29/17    Referring Provider  Dr. [name]      Oxygen   Oxygen  Continuous    Liters  8      Treadmill   MPH  1.8    Grade  0    Minutes  17      Bike   Level  1    Minutes  17      NuStep   Level  5    Minutes  17    METs  2      Prescription Details   Frequency (times per week)  2    Duration  Progress to 45 minutes of aerobic exercise without signs/symptoms of physical distress      Intensity   THRR 40-80% of Max Heartrate  58-115    Ratings of Perceived Exertion  11-13    Perceived Dyspnea  0-4      Progression   Progression  Continue progressive overload as per policy without signs/symptoms or physical distress.      Resistance Training   Training Prescription  Yes    Weight  Blue bands    Reps  10-15       Perform Capillary Blood Glucose checks as needed.  Exercise Prescription Changes: Exercise Prescription Changes    Row Name 02/04/17 1100 02/06/17 1200 02/11/17 1200 02/13/17 1200 02/18/17 1200     Response to Exercise   Blood Pressure (Admit)  132/70  130/80  134/64  122/60  136/70   Blood Pressure (Exercise)  140/80  150/96  140/76  140/74  128/80   Blood Pressure (Exit)  118/60  120/64  118/60  130/86  132/82   Heart Rate (Admit)  88 bpm  92 bpm  86 bpm  90 bpm  73 bpm   Heart Rate (Exercise)  107 bpm  117 bpm  102 bpm  99 bpm  97 bpm   Heart Rate (Exit)  93 bpm  106 bpm  93 bpm  71 bpm  84 bpm   Oxygen Saturation (Admit)  94 %  92 %  97 %  87 %  98 %   Oxygen Saturation (Exercise)  89 %  86 %  89 %  89 %  89 %   Oxygen Saturation (Exit)  95 %  89 %  90 %  97 %  95 %   Rating of Perceived Exertion (Exercise)  _0 Perceived Dyspnea (Exercise)  _1 Duration  Continue with 45 min of aerobic exercise without signs/symptoms of physical distress.  Continue with 45 min of aerobic exercise without signs/symptoms of physical distress.  Continue with 45 min of aerobic exercise without signs/symptoms of physical distress.  Continue with 45 min of aerobic exercise without signs/symptoms of physical distress.  Continue with 45 min of aerobic exercise without signs/symptoms of physical distress.   Intensity  THRR unchanged  THRR unchanged  THRR unchanged  THRR unchanged  THRR unchanged     Progression   Progression  Continue to progress workloads to maintain intensity without signs/symptoms of physical distress.  Continue to progress workloads to maintain intensity without signs/symptoms of physical distress.  Continue to progress workloads to maintain intensity without signs/symptoms of physical distress.  Continue to progress workloads to maintain intensity without signs/symptoms of physical distress.  Continue to progress workloads to maintain intensity without signs/symptoms of physical distress.     Resistance Training   Training Prescription  Yes  Yes  Yes  Yes  Yes   Weight  blue bands  blue bands  blue bands  blue bands  blue bands   Reps  10-15  10-15  10-15  10-15  10-15   Time  10 Minutes  10 Minutes  10 Minutes  10 Minutes  10 Minutes     Interval Training   Interval Training  No  No  No  No  No     Oxygen   Oxygen  Continuous  Continuous  Continuous  Continuous  Continuous   Liters  4-5  4-5  4-5  4-5  4-5     Bike   Level  0._0 Minutes  _1 NuStep   Level  6  -  6  -  6   Minutes  17  -  17  -  17   METs  2.3  -  2.4  -  2.6     Track   Laps  _2 Minutes  _3 Row Name 02/20/17 1200 02/25/17 1200 02/27/17 1200 05/06/17 1200 05/13/17 1600     Response to Exercise   Blood Pressure (Admit)  122/74  120/70  122/70  122/80  130/74   Blood Pressure (Exercise)  140/80   150/70  150/80  140/90  150/72   Blood Pressure (Exit)  128/84  100/70  114/70  118/72  110/64   Heart Rate (Admit)  88 bpm  93 bpm  92 bpm  84 bpm  78 bpm   Heart Rate (Exercise)  99 bpm  110 bpm  107 bpm  99 bpm  96 bpm   Heart Rate (Exit)  88 bpm  95 bpm  92 bpm  80 bpm  87 bpm   Oxygen Saturation (Admit)  93 %  96 %  98 %  93 %  92 %   Oxygen Saturation (Exercise)  89 %  83 % O2 increased to 8L sat improved to 87% on TM  89 %  86 %  86 %   Oxygen Saturation (Exit)  97 %  96 %  96 %  95 %  91 %   Rating of Perceived Exertion (Exercise)  _4 Perceived Dyspnea (Exercise)  2  1  0  0  2   Duration  Continue with 45 min of aerobic exercise without signs/symptoms of physical distress.  Continue with 45 min of aerobic exercise without signs/symptoms of physical distress.  Continue with 45 min of aerobic exercise without signs/symptoms of physical distress.  Continue with 45 min of aerobic exercise without signs/symptoms of physical distress.  Continue with 45 min of aerobic exercise without signs/symptoms of physical distress.   Intensity  THRR unchanged  THRR unchanged  THRR unchanged  THRR unchanged  THRR unchanged     Progression   Progression  Continue to progress workloads to maintain intensity without signs/symptoms of physical distress.  Continue to progress workloads to maintain intensity without signs/symptoms of physical distress.  Continue to progress workloads to maintain intensity without signs/symptoms of physical distress.  Continue to progress workloads to maintain intensity without signs/symptoms of physical distress.  Continue to progress workloads to maintain intensity without signs/symptoms of physical distress.     Resistance Training   Training Prescription  Yes  Yes  Yes  Yes  Yes   Weight  blue bands  blue bands  blue bands  blue bands  blue bands   Reps  10-15  10-15  10-15  10-15  10-15   Time  10 Minutes  10 Minutes  10 Minutes  10 Minutes  10 Minutes      Interval Training   Interval Training  No  No  No  No  No     Oxygen   Oxygen  Continuous  Continuous  Continuous  Continuous  Continuous   Liters  _0 Treadmill   MPH  -  1.8  1.8  1.8  1.8   Grade  -  0  0  0  0   Minutes  -  _1 Bike   Level  -  1  1.2  -  1   Minutes  -  17  17  -  17     NuStep   Level  7  7  -  5  3   Minutes  17  17  -  17  17   METs  2.4  2.6  -  2.1  2.1     Track   Laps  9  -  -  -  -   Minutes  17  -  -  -  -   Row Name 05/15/17 1200 05/20/17 1229 05/22/17 1200 05/27/17 1200 06/03/17 1200     Response to Exercise   Blood Pressure (Admit)  118/68  120/74  120/70  140/70  146/80   Blood Pressure (Exercise)  142/82  132/88  156/82  132/66  118/70   Blood Pressure (Exit)  118/60  104/72  114/70  118/68  120/80   Heart Rate (Admit)  80 bpm  81 bpm  89 bpm  88 bpm  99 bpm   Heart Rate (Exercise)  88 bpm  99 bpm  104 bpm  100 bpm  112 bpm   Heart Rate (Exit)  78 bpm  83 bpm  81 bpm  80 bpm  91 bpm   Oxygen Saturation (Admit)  92 %  90 %  91 %  98 %  99 %   Oxygen Saturation (Exercise)  88 %  86 %  87 %  88 %  87 %   Oxygen Saturation (Exit)  99 %  99 %  98 %  99 %  98 %   Rating of Perceived Exertion (Exercise)  _2 Perceived Dyspnea (Exercise)  0  _3 Duration  Continue with 45 min of aerobic exercise without signs/symptoms of physical distress.  Continue with 45 min of aerobic exercise without signs/symptoms of physical distress.  Continue with 45 min of aerobic exercise without signs/symptoms of physical distress.  Continue with 45  min of aerobic exercise without signs/symptoms of physical distress.  Continue with 45 min of aerobic exercise without signs/symptoms of physical distress.   Intensity  THRR unchanged  THRR unchanged  THRR unchanged  THRR unchanged  THRR unchanged     Progression   Progression  Continue to progress workloads to maintain intensity without signs/symptoms of physical  distress.  Continue to progress workloads to maintain intensity without signs/symptoms of physical distress.  Continue to progress workloads to maintain intensity without signs/symptoms of physical distress.  Continue to progress workloads to maintain intensity without signs/symptoms of physical distress.  Continue to progress workloads to maintain intensity without signs/symptoms of physical distress.     Resistance Training   Training Prescription  Yes  Yes  Yes  Yes  Yes   Weight  blue bands  blue bands  blue bands  blue bands  blue bands   Reps  10-15  10-15  10-15  10-15  10-15   Time  10 Minutes  10 Minutes  10 Minutes  10 Minutes  10 Minutes     Interval Training   Interval Training  No  No  No  No  No     Oxygen   Oxygen  Continuous  Continuous  Continuous  Continuous  Continuous   Liters  _0 Treadmill   MPH  1.8  1.8  1.8  1.8  1.8   Grade  0  0  0  0  0   Minutes  _1 Bike   Level  -  1  -  1  -   Minutes  -  17  -  17  -     NuStep   Level  _2 workload decreased not feeling well today  5   Minutes  _3 METs  2.1  2.4  2.3  2.3  2.4   Row Name 06/19/17 1200 07/03/17 1300 07/08/17 1200 07/22/17 1200 07/24/17 1300     Response to Exercise   Blood Pressure (Admit)  138/80  132/70  110/76  150/80  140/90   Blood Pressure (Exercise)  144/80  118/80  126/70  140/84  144/56   Blood Pressure (Exit)  122/80  112/62  108/76  100/72  112/74   Heart Rate (Admit)  77 bpm  83 bpm  84 bpm  90 bpm  83 bpm   Heart Rate (Exercise)  102 bpm  99 bpm  107 bpm  107 bpm  93 bpm   Heart Rate (Exit)  80 bpm  86 bpm  98 bpm  90 bpm  82 bpm   Oxygen Saturation (Admit)  95 %  96 %  96 %  97 %  94 %   Oxygen Saturation (Exercise)  84 %  92 %  86 % oxygen increased to 15L sat improved to 90%  87 % oxygen increased to 15L sat improved to 90%  83 % oxygen increased to 15L sat improved to 90%   Oxygen Saturation (Exit)  99 %  98 %  98 %   90 %  93 %   Rating of Perceived Exertion (Exercise)  _4 Perceived Dyspnea (Exercise)  1  0  1  1  3   Duration  Continue with 45 min of aerobic exercise without signs/symptoms of physical distress.  Continue with 45 min of aerobic exercise without signs/symptoms of physical distress.  Continue with 45 min of aerobic exercise without signs/symptoms of physical distress.  Continue with 45 min of aerobic exercise without signs/symptoms of physical distress.  Continue with 45 min of aerobic exercise without signs/symptoms of physical distress.   Intensity  THRR unchanged  THRR unchanged  THRR unchanged  THRR unchanged  THRR unchanged     Progression   Progression  Continue to progress workloads to maintain intensity without signs/symptoms of physical distress.  Continue to progress workloads to maintain intensity without signs/symptoms of physical distress.  Continue to progress workloads to maintain intensity without signs/symptoms of physical distress.  Continue to progress workloads to maintain intensity without signs/symptoms of physical distress.  Continue to progress workloads to maintain intensity without signs/symptoms of physical distress.     Resistance Training   Training Prescription  Yes  Yes  Yes  Yes  Yes   Weight  blue bands  blue bands  blue bands  blue bands  blue bands   Reps  10-15  10-15  10-15  10-15  10-15   Time  10 Minutes  10 Minutes  10 Minutes  10 Minutes  10 Minutes     Interval Training   Interval Training  No  No  No  No  No     Oxygen   Oxygen  Continuous  Continuous  Continuous  Continuous  Continuous   Liters  10  10  10-15  10-15  10-15     Treadmill   MPH  1.8  -  1.8  1.8  1.9   Grade  0  -  0  0  1   Minutes  17  -  _0 Bike   Level  -  _1 0.5   Minutes  -  _2 NuStep   Level  _3 -   Minutes  _4 -   METs  2.4  2.3  2  2.5  -     Home Exercise Plan   Plans to continue exercise  at  -  -  Home (comment)  -  -   Frequency  -  -  Add 3 additional days to program exercise sessions.  -  -   Row Name 07/29/17 1200             Response to Exercise   Blood Pressure (Admit)  130/90       Blood Pressure (Exercise)  130/70       Blood Pressure (Exit)  116/70       Heart Rate (Admit)  90 bpm       Heart Rate (Exercise)  109 bpm       Heart Rate (Exit)  99 bpm       Oxygen Saturation (Admit)  97 %       Oxygen Saturation (Exercise)  92 %       Oxygen Saturation (Exit)  98 %       Rating of Perceived Exertion (Exercise)  11       Perceived Dyspnea (Exercise)  1       Duration  Continue  with 45 min of aerobic exercise without signs/symptoms of physical distress.       Intensity  THRR unchanged         Progression   Progression  Continue to progress workloads to maintain intensity without signs/symptoms of physical distress.         Resistance Training   Training Prescription  Yes       Weight  blue bands       Reps  10-15       Time  10 Minutes         Interval Training   Interval Training  No         Oxygen   Oxygen  Continuous       Liters  10-15         Treadmill   MPH  1.8       Grade  0       Minutes  17         Bike   Level  1       Minutes  17         NuStep   Level  6       Minutes  17       METs  2          Exercise Comments: Exercise Comments    Row Name 07/10/17 0745           Exercise Comments  Home exercise completed          Exercise Goals and Review: Exercise Goals    Row Name 04/25/17 1514             Exercise Goals   Increase Physical Activity  Yes       Intervention  Provide advice, education, support and counseling about physical activity/exercise needs.;Develop an individualized exercise prescription for aerobic and resistive training based on initial evaluation findings, risk stratification, comorbidities and participant's personal goals.       Expected Outcomes  Achievement of increased cardiorespiratory  fitness and enhanced flexibility, muscular endurance and strength shown through measurements of functional capacity and personal statement of participant.       Increase Strength and Stamina  Yes       Intervention  Provide advice, education, support and counseling about physical activity/exercise needs.;Develop an individualized exercise prescription for aerobic and resistive training based on initial evaluation findings, risk stratification, comorbidities and participant's personal goals.       Expected Outcomes  Achievement of increased cardiorespiratory fitness and enhanced flexibility, muscular endurance and strength shown through measurements of functional capacity and personal statement of participant.       Able to understand and use rate of perceived exertion (RPE) scale  Yes       Intervention  Provide education and explanation on how to use RPE scale       Expected Outcomes  Short Term: Able to use RPE daily in rehab to express subjective intensity level;Long Term:  Able to use RPE to guide intensity level when exercising independently       Able to understand and use Dyspnea scale  Yes       Intervention  Provide education and explanation on how to use Dyspnea scale       Expected Outcomes  Short Term: Able to use Dyspnea scale daily in rehab to express subjective sense of shortness of breath during exertion;Long Term: Able to use Dyspnea scale to guide intensity level when exercising independently  Knowledge and understanding of Target Heart Rate Range (THRR)  Yes       Intervention  Provide education and explanation of THRR including how the numbers were predicted and where they are located for reference       Expected Outcomes  Short Term: Able to state/look up THRR;Long Term: Able to use THRR to govern intensity when exercising independently;Short Term: Able to use daily as guideline for intensity in rehab       Understanding of Exercise Prescription  Yes       Intervention   Provide education, explanation, and written materials on patient's individual exercise prescription       Expected Outcomes  Short Term: Able to explain program exercise prescription;Long Term: Able to explain home exercise prescription to exercise independently          Exercise Goals Re-Evaluation : Exercise Goals Re-Evaluation    Row Name 02/25/17 1556 05/12/17 1335 06/14/17 1406 07/10/17 1615 07/28/17 1635     Exercise Goal Re-Evaluation   Exercise Goals Review  Increase Strenth and Stamina;Increase Physical Activity  Increase Strength and Stamina;Able to understand and use Dyspnea scale;Able to understand and use rate of perceived exertion (RPE) scale;Increase Physical Activity;Knowledge and understanding of Target Heart Rate Range (THRR);Understanding of Exercise Prescription  Increase Physical Activity;Increase Strength and Stamina;Able to understand and use Dyspnea scale;Able to understand and use rate of perceived exertion (RPE) scale;Knowledge and understanding of Target Heart Rate Range (THRR);Understanding of Exercise Prescription  Increase Physical Activity;Increase Strength and Stamina;Able to understand and use Dyspnea scale;Able to understand and use rate of perceived exertion (RPE) scale;Knowledge and understanding of Target Heart Rate Range (THRR);Understanding of Exercise Prescription  Increase Physical Activity;Increase Strength and Stamina;Able to understand and use Dyspnea scale;Able to understand and use rate of perceived exertion (RPE) scale;Knowledge and understanding of Target Heart Rate Range (THRR);Understanding of Exercise Prescription   Comments  Patient has advanced to using the treadmill. MET average ranges from 2.4-2.6. Home exercise reviewed. Will cont to monitor and progress.   Patient has only attended one exercise session. Will cont. to monitor and progress.   Patient is struggling with exercise progression due to disease progression. METs range in the "low" level.  Patient has purchased a treadmill and is going to start exercising at home. Will cont. to monitor and progress as able.   Patient is struggling with exercise progression due to disease progression. METs range in the "low" level. Has treadmill at home and has started his home exercise routine. Will cont. to monitor and progress as able.   Patient is struggling with exercise progression due to disease progression. METs range in the "low" level. Has treadmill at home and has started his home exercise routine. Will cont. to monitor and progress as able.    Expected Outcomes  Through the exercise here at rehab the patient with increase physical capacity, strength, and stamina.  Through the exercise here at rehab the patient with increase physical capacity, strength, and stamina.  Through the exercise here at rehab the patient with increase physical capacity, strength, and stamina.  Through exercise at rehab and at home, patient will increase strength and stamina making ADL's easier to perform. Patient will also have a better understanding of safe exercise and what they are capable to do outside of clinical supervision.  Through exercise at rehab and at home, patient will increase strength and stamina making ADL's easier to perform. Patient will also have a better understanding of safe exercise and what they  are capable to do outside of clinical supervision.      Discharge Exercise Prescription (Final Exercise Prescription Changes): Exercise Prescription Changes - 07/29/17 1200      Response to Exercise   Blood Pressure (Admit)  130/90    Blood Pressure (Exercise)  130/70    Blood Pressure (Exit)  116/70    Heart Rate (Admit)  90 bpm    Heart Rate (Exercise)  109 bpm    Heart Rate (Exit)  99 bpm    Oxygen Saturation (Admit)  97 %    Oxygen Saturation (Exercise)  92 %    Oxygen Saturation (Exit)  98 %    Rating of Perceived Exertion (Exercise)  11    Perceived Dyspnea (Exercise)  1    Duration  Continue  with 45 min of aerobic exercise without signs/symptoms of physical distress.    Intensity  THRR unchanged      Progression   Progression  Continue to progress workloads to maintain intensity without signs/symptoms of physical distress.      Resistance Training   Training Prescription  Yes    Weight  blue bands    Reps  10-15    Time  10 Minutes      Interval Training   Interval Training  No      Oxygen   Oxygen  Continuous    Liters  10-15      Treadmill   MPH  1.8    Grade  0    Minutes  17      Bike   Level  1    Minutes  17      NuStep   Level  6    Minutes  17    METs  2       Nutrition:  Target Goals: Understanding of nutrition guidelines, daily intake of sodium <1593m, cholesterol <2020m calories 30% from fat and 7% or less from saturated fats, daily to have 5 or more servings of fruits and vegetables.  Biometrics:  Post Biometrics - 03/04/17 1232       Post  Biometrics   Height  _0  (1.676 m)    Weight  210 lb 8.6 oz (95.5 kg)    BMI (Calculated)  34.1    Grip Strength  15 kg    Flexibility  35.5 in       Nutrition Therapy Plan and Nutrition Goals: Nutrition Therapy & Goals - 06/19/17 1323      Nutrition Therapy   Diet  Therapeutic Lifestyle Changes      Personal Nutrition Goals   Nutrition Goal  Wt loss of 1-2 lb/week to a wt loss goal of 6-24 lb at graduation from Pulmonary Rehab    Personal Goal #2  Pt to increase fish consumption to at least 1-2 times/week    Personal Goal #3  Pt to increase consumption of fruits and vegetables from 0-1 cup per day to 2-3 cups per day.    Personal Goal #4  Pt to use nuts/peanut butter as a small snack or protein choice 2 times/week or more.      Intervention Plan   Intervention  Prescribe, educate and counsel regarding individualized specific dietary modifications aiming towards targeted core components such as weight, hypertension, lipid management, diabetes, heart failure and other comorbidities.     Expected Outcomes  Short Term Goal: Understand basic principles of dietary content, such as calories, fat, sodium, cholesterol and nutrients.;Long Term Goal: Adherence to prescribed nutrition plan.  Nutrition Discharge: Rate Your Plate Scores: Nutrition Assessments - 06/19/17 1325      Rate Your Plate Scores   Pre Score  53       Nutrition Goals Re-Evaluation: Nutrition Goals Re-Evaluation    Row Name 03/13/17 0717 05/12/17 1028           Goals   Current Weight  210 lb 8.6 oz (95.5 kg)  210 lb 5.1 oz (95.4 kg)      Nutrition Goal  -  Wt loss of 1-2 lb/week to a wt loss goal of 6-24 lb at graduation from Pulmonary Rehab      Comment  Pt wt is down 2 lb since admission. Wt loss goal wt not achieved. Pt has been limiting food sources of sodium and now always/usually compares and chooses lower sodium food options.   Pt has maintained his wt while in rehab. Wt loss goal wt not achieved. Pt has been limiting food sources of sodium and now usually compares and chooses lower sodium food options.         Personal Goal #2 Re-Evaluation   Personal Goal #2  -  Identify and limit food sources of sodium         Nutrition Goals Discharge (Final Nutrition Goals Re-Evaluation): Nutrition Goals Re-Evaluation - 05/12/17 1028      Goals   Current Weight  210 lb 5.1 oz (95.4 kg)    Nutrition Goal  Wt loss of 1-2 lb/week to a wt loss goal of 6-24 lb at graduation from Pulmonary Rehab    Comment  Pt has maintained his wt while in rehab. Wt loss goal wt not achieved. Pt has been limiting food sources of sodium and now usually compares and chooses lower sodium food options.       Personal Goal #2 Re-Evaluation   Personal Goal #2  Identify and limit food sources of sodium       Psychosocial: Target Goals: Acknowledge presence or absence of significant depression and/or stress, maximize coping skills, provide positive support system. Participant is able to verbalize types and ability to use  techniques and skills needed for reducing stress and depression.  Initial Review & Psychosocial Screening: Initial Psych Review & Screening - 04/25/17 1547      Initial Review   Current issues with  None Identified    Source of Stress Concerns  None Identified      Family Dynamics   Good Support System?  Yes      Barriers   Psychosocial barriers to participate in program  There are no identifiable barriers or psychosocial needs.       Quality of Life Scores:   PHQ-9: Recent Review Flowsheet Data    Depression screen [facility] 2/9 04/25/2017 03/04/2017 11/15/2016   Decreased Interest 0 0 0   Down, Depressed, Hopeless 0 0 0   PHQ - 2 Score 0 0 0     Interpretation of Total Score  Total Score Depression Severity:  1-4 = Minimal depression, 5-9 = Mild depression, 10-14 = Moderate depression, 15-19 = Moderately severe depression, 20-27 = Severe depression   Psychosocial Evaluation and Intervention: Psychosocial Evaluation - 04/25/17 1548      Psychosocial Evaluation & Interventions   Interventions  Encouraged to exercise with the program and follow exercise prescription    Continue Psychosocial Services   No Follow up required       Psychosocial Re-Evaluation: Psychosocial Re-Evaluation    [place] Name 05/16/17 1134 06/13/17 1132 07/07/17 1037  07/28/17 1742       Psychosocial Re-Evaluation   Current issues with  Current Sleep Concerns  Current Sleep Concerns;Current Depression;History of Depression;Current Stress Concerns  Current Sleep Concerns;Current Depression;History of Depression;Current Stress Concerns  Current Sleep Concerns;Current Depression;History of Depression;Current Stress Concerns    Comments  patient has a habit of waking at the same time every night and migrating to the kitchen and eating sweets. he has had behavior modification therapy in the past with success. he is incoraged to revisit need for therapy. this is keeping patient from getting a full nights rest and  causing him to continue to gain weight.  patient has been prescribed antidepressant this week by pulmonologist. Will address stress of illness, depression, and sleep with patient over then next 4-6 weeks as medication takes effect  patient states he is beginning to feel an improvement in his mood, probably as a result of the antidepressant he has been placed on. He also states he is beginning to sleep through the night with only 1 awakening since wearing his CPAP with oxygen.  patient continues to see small improvements in his mood and this is reflected through his social engagements in the class. He is sleeping through the nights more.    Expected Outcomes  patient will verbalize behavoir modification stratagies to help with getting up and eating with nighttime awakening  patient will verbalize behavoir modification stratagies to help with getting up and eating with nighttime awakening and will verbalize he has more energy and enjoyment in everyday activities  patient beginning to use behavior modification stratagies that help with his eating sugary snacks with nighttime awakenings.  patient continues to use behavior modification stratagies that help with his eating sugary snacks with nighttime awakenings.    Interventions  Encouraged to attend Pulmonary Rehabilitation for the exercise  Encouraged to attend Pulmonary Rehabilitation for the exercise  Encouraged to attend Pulmonary Rehabilitation for the exercise  Encouraged to attend Pulmonary Rehabilitation for the exercise    Continue Psychosocial Services   Follow up required by staff  Follow up required by staff folllow up required by MD  Follow up required by staff  Follow up required by staff    Comments  -  worsening of chronic illness  -  worsening of chronic illness      Initial Review   Source of Stress Concerns  None Identified  Chronic Illness  -  Chronic Illness       Psychosocial Discharge (Final Psychosocial Re-Evaluation): Psychosocial  Re-Evaluation - 07/28/17 1742      Psychosocial Re-Evaluation   Current issues with  Current Sleep Concerns;Current Depression;History of Depression;Current Stress Concerns    Comments  patient continues to see small improvements in his mood and this is reflected through his social engagements in the class. He is sleeping through the nights more.    Expected Outcomes  patient continues to use behavior modification stratagies that help with his eating sugary snacks with nighttime awakenings.    Interventions  Encouraged to attend Pulmonary Rehabilitation for the exercise    Continue Psychosocial Services   Follow up required by staff    Comments  worsening of chronic illness      Initial Review   Source of Stress Concerns  Chronic Illness       Education: Education Goals: Education classes will be provided on a weekly basis, covering required topics. Participant will state understanding/return demonstration of topics presented.  Learning Barriers/Preferences:   Education Topics: Risk Factor Reduction:  -  Group instruction that is supported by a PowerPoint presentation. Instructor discusses the definition of a risk factor, different risk factors for pulmonary disease, and how the heart and lungs work together.     Nutrition for Pulmonary Patient:  -Group instruction provided by PowerPoint slides, verbal discussion, and written materials to support subject matter. The instructor gives an explanation and review of healthy diet recommendations, which includes a discussion on weight management, recommendations for fruit and vegetable consumption, as well as protein, fluid, caffeine, fiber, sodium, sugar, and alcohol. Tips for eating when patients are short of breath are discussed.   PULMONARY REHAB OTHER RESPIRATORY from 07/24/2017 in [place]  Date  02/13/17  Educator  RD  Instruction Review Code  2- meets goals/outcomes      Pursed Lip Breathing:   -Group instruction that is supported by demonstration and informational handouts. Instructor discusses the benefits of pursed lip and diaphragmatic breathing and detailed demonstration on how to preform both.     Oxygen Safety:  -Group instruction provided by PowerPoint, verbal discussion, and written material to support subject matter. There is an overview of "What is Oxygen" and "Why do we need it".  Instructor also reviews how to create a safe environment for oxygen use, the importance of using oxygen as prescribed, and the risks of noncompliance. There is a brief discussion on traveling with oxygen and resources the patient may utilize.   PULMONARY REHAB OTHER RESPIRATORY from 07/24/2017 in [place]  Date  06/19/17  Educator  Marigrace Mccole  Instruction Review Code  2- meets goals/outcomes      Oxygen Equipment:  -Group instruction provided by [employer] Staff utilizing handouts, written materials, and equipment demonstrations.   PULMONARY REHAB OTHER RESPIRATORY from 07/24/2017 in [place]  Date  01/16/17  Educator  George[employer]  Instruction Review Code  2- meets goals/outcomes      Signs and Symptoms:  -Group instruction provided by written material and verbal discussion to support subject matter. Warning signs and symptoms of infection, stroke, and heart attack are reviewed and when to call the physician/911 reinforced. Tips for preventing the spread of infection discussed.   PULMONARY REHAB OTHER RESPIRATORY from 07/24/2017 in [place]  Date  05/15/17  Educator  rn  Instruction Review Code  2- meets goals/outcomes      Advanced Directives:  -Group instruction provided by verbal instruction and written material to support subject matter. Instructor reviews Advanced Directive laws and proper instruction for filling out document.   Pulmonary Video:  -Group video education that  reviews the importance of medication and oxygen compliance, exercise, good nutrition, pulmonary hygiene, and pursed lip and diaphragmatic breathing for the pulmonary patient.   PULMONARY REHAB OTHER RESPIRATORY from 07/24/2017 in [place]  Date  05/22/17  Instruction Review Code  2- meets goals/outcomes      Exercise for the Pulmonary Patient:  -Group instruction that is supported by a PowerPoint presentation. Instructor discusses benefits of exercise, core components of exercise, frequency, duration, and intensity of an exercise routine, importance of utilizing pulse oximetry during exercise, safety while exercising, and options of places to exercise outside of rehab.     PULMONARY REHAB OTHER RESPIRATORY from 07/24/2017 in [place]  Date  05/06/17  Educator  ep  Instruction Review Code  R- Review/reinforce      Pulmonary Medications:  -Verbally interactive  group education provided by instructor with focus on inhaled medications and proper administration.   PULMONARY REHAB OTHER RESPIRATORY from 07/24/2017 in [place]  Date  01/09/17  Educator  Pharm  Instruction Review Code  2- meets goals/outcomes      Anatomy and Physiology of the Respiratory System and Intimacy:  -Group instruction provided by PowerPoint, verbal discussion, and written material to support subject matter. Instructor reviews respiratory cycle and anatomical components of the respiratory system and their functions. Instructor also reviews differences in obstructive and restrictive respiratory diseases with examples of each. Intimacy, Sex, and Sexuality differences are reviewed with a discussion on how relationships can change when diagnosed with pulmonary disease. Common sexual concerns are reviewed.   MD DAY -A group question and answer session with a medical doctor that allows participants to ask questions that  relate to their pulmonary disease state.   PULMONARY REHAB OTHER RESPIRATORY from 07/24/2017 in [place]  Date  06/03/17  Educator  yacoub  Instruction Review Code  2- meets goals/outcomes      OTHER EDUCATION -Group or individual verbal, written, or video instructions that support the educational goals of the pulmonary rehab program.   PULMONARY REHAB OTHER RESPIRATORY from 07/24/2017 in [place]  Date  07/03/17 [IWLNLGX Eating]  Educator  RD  Instruction Review Code  1- Verbalizes Understanding      Knowledge Questionnaire Score: Knowledge Questionnaire Score - 02/27/17 1537      Knowledge Questionnaire Score   Post Score  11/13       Core Components/Risk Factors/Patient Goals at Admission: Personal Goals and Risk Factors at Admission - 04/25/17 1546      Core Components/Risk Factors/Patient Goals on Admission   Improve shortness of breath with ADL's  Yes    Intervention  Provide education, individualized exercise plan and daily activity instruction to help decrease symptoms of SOB with activities of daily living.    Expected Outcomes  Short Term: Achieves a reduction of symptoms when performing activities of daily living.    Hypertension  Yes    Intervention  Provide education on lifestyle modifcations including regular physical activity/exercise, weight management, moderate sodium restriction and increased consumption of fresh fruit, vegetables, and low fat dairy, alcohol moderation, and smoking cessation.;Monitor prescription use compliance.    Expected Outcomes  Short Term: Continued assessment and intervention until BP is < 140/45m HG in hypertensive participants. < 130/869mHG in hypertensive participants with diabetes, heart failure or chronic kidney disease.;Long Term: Maintenance of blood pressure at goal levels.       Core Components/Risk Factors/Patient Goals Review:  Goals and Risk Factor Review     Row Name 02/25/17 1551 05/16/17 1132 06/13/17 1120 07/07/17 1028 07/28/17 1739     Core Components/Risk Factors/Patient Goals Review   Personal Goals Review  Weight Management/Obesity;Improve shortness of breath with ADL's;Develop more efficient breathing techniques such as purse lipped breathing and diaphragmatic breathing and practicing self-pacing with activity.;Hypertension;Stress  Develop more efficient breathing techniques such as purse lipped breathing and diaphragmatic breathing and practicing self-pacing with activity.;Hypertension;Stress;Weight Management/Obesity;Improve shortness of breath with ADL's;Increase knowledge of respiratory medications and ability to use respiratory devices properly.  Develop more efficient breathing techniques such as purse lipped breathing and diaphragmatic breathing and practicing self-pacing with activity.;Hypertension;Stress;Weight Management/Obesity;Improve shortness of breath with ADL's;Increase knowledge of respiratory medications and ability to use respiratory devices properly.  Develop more efficient breathing techniques such as purse lipped breathing  and diaphragmatic breathing and practicing self-pacing with activity.;Hypertension;Stress;Weight Management/Obesity;Improve shortness of breath with ADL's;Increase knowledge of respiratory medications and ability to use respiratory devices properly.  Develop more efficient breathing techniques such as purse lipped breathing and diaphragmatic breathing and practicing self-pacing with activity.;Hypertension;Stress;Weight Management/Obesity;Improve shortness of breath with ADL's;Increase knowledge of respiratory medications and ability to use respiratory devices properly.   Review  his weight remains at baseline. his wife states the patient is more aware of the "unhealthy" things he eats. he utilizes PLB and pacing however he sometimes feels he cant get enough air through his nose. I have worked with patient on taking  in larger breaths through his mouth and blowing out through pursed lips. This technique makes him feel more satisfied with air consumption and decreases the anxiety he has when he feels he cant get enough air through his nose. he has learned to pace himself so that he can tolerate workload increases. his hypertension has resolved with medication. 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 he admits to not following through with his discharge home exercise plan and by being sedentary for the last several months patient has become deconditioned. It is too early in this admission to evaluate progress towards current program and personal goals.  Patient is attempting to be more compliant with activity prescribed at home. He voices feeling "worse" on a daily basis and has seen his pulmonologist this week for a full work-up. His BP remains high at times, his SOB is worsening and his oxygen requirements are increasing. Will have a better idea of treatment plan goals once all test results are reviewed by md.  patient has accepted his need for weekly exercise and is walking on his treadmill as prescribed. He is attempting to cut back on his "unhealthy" eating in order to loose weight. He verbalizes watching his sugar intake as it relates to inflammation. 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 His resting BP prior to exercise ranges from 120s-140s and during exertion 115-150s.  patient making very slow progression towards goals related to disease progression and deconditioning. He is to be commended on his weight loss of 7 lbs since admission. He has done this by limiting simple carbs such as sweet tea and drinking more water. He is also doing his best to limit nighttime snacking. His HTN has stabalized on current medication regimine and he seems to be less anxious and stressed over the progression of his disease. He continues to use PLB  during exercise.   Expected Outcomes  see admission expected outcomes  see admission expected outcomes  see admission expected outcomes  see admission expected outcomes  see admission expected outcomes      Core Components/Risk Factors/Patient Goals at Discharge (Final Review):  Goals and Risk Factor Review - 07/28/17 1739      Core Components/Risk Factors/Patient Goals Review   Personal Goals Review  Develop more efficient breathing techniques such as purse lipped breathing and diaphragmatic breathing and practicing self-pacing with activity.;Hypertension;Stress;Weight Management/Obesity;Improve shortness of breath with ADL's;Increase knowledge of respiratory medications and ability to use respiratory devices properly.    Review  patient making very slow progression towards goals related to disease progression and deconditioning. He is to be commended on his weight loss of 7 lbs since admission. He has done this by limiting simple carbs such as sweet tea and drinking more water. He is also doing his  best to limit nighttime snacking. His HTN has stabalized on current medication regimine and he seems to be less anxious and stressed over the progression of his disease. He continues to use PLB during exercise.    Expected Outcomes  see admission expected outcomes       ITP Comments:   Comments: Patient has attended 13 sessions since admission

## 2017-08-05 ENCOUNTER — Encounter (HOSPITAL_COMMUNITY): Payer: Medicare Other

## 2017-08-07 ENCOUNTER — Telehealth: Payer: Self-pay | Admitting: Pulmonary Disease

## 2017-08-07 ENCOUNTER — Encounter (HOSPITAL_COMMUNITY): Payer: Medicare Other

## 2017-08-07 MED ORDER — HYDROCOD POLST-CPM POLST ER 10-8 MG/5ML PO SUER: 5.0000 mL | Freq: Two times a day (BID) | ORAL | 0 refills | Status: DC | PRN

## 2017-08-07 NOTE — Telephone Encounter (Signed)
Left message for patient to call back  

## 2017-08-07 NOTE — Telephone Encounter (Signed)
Spoke with pt's wife, Myriam JacobsonHelen who stated that Dr. Kendrick FriesMcQuaid had told them that if they needed a refill of the hycodan cough syrup for them to call for a refill.  Dr. Maple HudsonYoung, are you okay signing this Rx due to Dr. Kendrick FriesMcQuaid not being in the office? Please advise.  Thanks!  Allergies  Allergen Reactions  . Gabapentin     Dizziness   . Sulfonamide Derivatives Rash  . Trazodone And Nefazodone Rash and Other (See Comments)    Hallucinations      Current Outpatient Medications:  .  albuterol (PROVENTIL) (2.5 MG/3ML) 0.083% nebulizer solution, Take 3 mLs (2.5 mg total) by nebulization every 6 (six) hours as needed for wheezing or shortness of breath., Disp: 360 mL, Rfl: 11 .  amLODipine (NORVASC) 2.5 MG tablet, Take 2.5 mg by mouth daily., Disp: , Rfl:  .  aspirin 81 MG tablet, Take 81 mg by mouth daily., Disp: , Rfl:  .  atorvastatin (LIPITOR) 40 MG tablet, Take 1 tablet (40 mg total) by mouth daily at 6 PM., Disp: 30 tablet, Rfl: 0 .  chlorpheniramine-HYDROcodone (TUSSIONEX PENNKINETIC ER) 10-8 MG/5ML SUER, Take 5 mLs by mouth every 12 (twelve) hours as needed for cough., Disp: 140 mL, Rfl: 0 .  cyanocobalamin 500 MCG tablet, Take 1,000 mcg by mouth daily., Disp: , Rfl:  .  doxycycline (VIBRA-TABS) 100 MG tablet, Take 1 tablet (100 mg total) by mouth 2 (two) times daily., Disp: 10 tablet, Rfl: 0 .  DULoxetine (CYMBALTA) 20 MG capsule, Take 20 mg by mouth daily., Disp: , Rfl:  .  furosemide (LASIX) 20 MG tablet, Take 1 tab daily prn in addition to 40mg  to keep weight between 195-196lbs, Disp: 30 tablet, Rfl: 1 .  furosemide (LASIX) 40 MG tablet, Take 1 tablet (40 mg total) by mouth daily., Disp: 5 tablet, Rfl: 0 .  Nintedanib (OFEV) 150 MG CAPS, Take 150 mg by mouth 2 (two) times daily., Disp: 60 capsule, Rfl:  .  oxymetazoline (AFRIN) 0.05 % nasal spray, Place 1 spray into both nostrils as needed for congestion., Disp: , Rfl:  .  pramipexole (MIRAPEX) 0.5 MG tablet, TAKE 1 TABLET BY MOUTH EVERY  MORNING AND TAKE 1 TABLET EVERY EVENING, Disp: 180 tablet, Rfl: 3 .  PROAIR HFA 108 (90 BASE) MCG/ACT inhaler, Inhale 2 puffs into the lungs every 6 (six) hours as needed., Disp: , Rfl:  .  QUEtiapine (SEROQUEL) 100 MG tablet, , Disp: , Rfl:  .  sodium chloride HYPERTONIC 3 % nebulizer solution, Take by nebulization 2 (two) times daily as needed for other., Disp: 224 mL, Rfl: 12 .  triamcinolone cream (KENALOG) 0.1 %, APPLY 1 APPLICATION ON THE SKIN TWICE A DAY AS NEEDED, Disp: , Rfl: 2

## 2017-08-07 NOTE — Telephone Encounter (Signed)
Ok to refill 

## 2017-08-07 NOTE — Telephone Encounter (Signed)
BQ came by the office and we refilled pt's cough syrup. Rx has been signed.  Called pt letting him know the Rx was ready for him to pick up.  Pt stated it would probably be tomorrow, 08/08/17 before he was able to come by to pick the Rx up. Will place it up front for him. Nothing further needed.

## 2017-08-11 NOTE — Progress Notes (Signed)
[facility]  PATIENT: Nance PewRufus B Pask DOB: [birth year]   REASON FOR VISIT: Follow-up for CVA, restless legs, paresthesias, dyslipidemia, obstructive sleep apnea HISTORY FROM: Patient    HISTORY OF PRESENT ILLNESS:HistoryMr. Schleyer, 76 year old male returns for followup. He has a history of restless leg syndrome and sensory dysesthesias. He also has a history of obstructive sleep apnea with good compliance of CPAP in the past however due to his chronic cough in October last year which did not respond to antibiotics or prednisone. He has been unable to use his CPAP since that time . He was referred to ENT and [facility]. On today's visit he also reports continued numbness, tingling/burning and discomfort in his feet, this is been an ongoing problem but it has worsened. EMG nerve conduction after last visit was normal. He has been placed on a trial of Neurontin by his primary care however had side effects of dizziness on the medication.He has a history of spinal stenosis and lumbar spine decompression 2011 by Dr. Gerlene FeeKritzer. He has had 2 epidural injections since last seen with minimal benefit. He denies any falls however he does feel off balance at times. He has intermittent back pain since his surgery. His restless legs are under good control with his Mirapex. He returns for reevaluation  MRI of the spine (lumbar) that he had done in April of 2009 showed moderate to severe spinal stenosis. He had lumbar spine decompression and fusion by Dr. Gerlene FeeKritzer in 2011, did very well. He also has a history of right and left knee replacements and a shoulder replacement. He is currently taking Mirapex for his restless legs tolerating the medication without drowsiness or any type of compulsive behaviors. iron profile was normal.   Update 12/5/2016PS : Patient is seen today for follow-up after her recent hospital admission for TIA on 05/31/15. He presented  following an episode of word finding difficulty as well as slurred speech. He was last known well at 4:30 PM on 05/31/2015. Deficits lasted about 45 minutes then resolved. He has no previous history of stroke nor TIA. [facility] and [facility]. He had not been on antiplatelets therapy. CT scan of his head showed no acute intracranial abnormality. NIH stroke score on admission evaluation was 0. Patient was not administered TPA secondary to Deficits resolved rapidly. He was admitted for further evaluation and treatment. MRI scan of the brain showed no acute infarct only mild age-related changes of cerebral atrophy and chronic small vessel ischemia. MRA of the brain showed no large vessel stenosis. Mild distal atheromatous changes were noted in MCA and PCA branches. Carotid ultrasound showed no significant Stenosis. Transthoracic echo showed normal ejection fraction. NIH stroke scale was 0. ABCD 2 score was 4. Patient qualified for and signed consent and participate in the PARFAIT TIA study. LDL cholesterol was elevated at 103 and he was started on Lipitor. He states his done well since discharge and has not had any recurrent stroke or TIA symptoms. He was seen by Dr. Roda ShuttersXu on 06/28/15 for 30 days study visit and study medication was discontinued. He is currently on aspirin 81 mg which is tolerating well without bleeding or bruising. He is also not having any side effects on Lipitor 40 mg daily which is taking everyday. He has not had any new complaints.  UPDATE 10/24/2015 CMMr Marton, 76 year old returns for followup. He was last seen in the office  by Dr. Pearlean BrownieSethi 07/31/2015. He has history of TIA with an episode of word finding and slurred speech on 05/31/2015. He was started on baby aspirin. MRI without acute infarct. MRA no large vessel stenosis. Carotid ultrasound negative. LDL cholesterol was elevated and he was started on Lipitor. His restless legs are  in good control with Mirapex. He is also on amitriptyline at bedtime. He remains on Ambien for insomnia. He does not use his CPAP for his obstructive sleep apnea although he says he may start back.He has not had further stroke or TIA symptoms. He returns for reevaluation UPDATE 06/15/2017CM Mr. Juanda BondScarboro, 76 year old male returns for follow-up. History of TIA events with word finding difficulty and slurred speech which occurred in October 2016. He has not had further stroke or TIA symptoms and is currently on aspirin and Lipitor for secondary stroke prevention. He also has a history of restless legs which are in good control with Mirapex. His amitriptyline has been tapered by his pulmonologist. He is currently not using CPAP due to shortness of breath. He is due for a pulmonary function next week.He continues to complain of  numbness, tingling/burning and discomfort in his feet, this is been an ongoing problem , EMG nerve conduction in the past x 2  was normal.. He returns for reevaluation UPDATE 12/15/2017CM Mr. Juanda BondScarboro, 76 year old male returns for follow-up. He has a history of TIA event with word finding difficulty and slurred speech October 2016. He is currently on aspirin without bruising or bleeding. He has not had further stroke or TIA symptoms. In addition he is on Lipitor and denies myalgias. He is due to get labs done at primary care next week. He also has a long history of restless legs in good control with Mirapex. He has obstructive sleep apnea but does not use CPAP due to shortness of breath. He also has a history of chronic obstructive pulmonary disease and is due to have a pulmonary function test in January. Next Carotid Doppler's scheduled  October 11 2016 at  cardiovascular imaging. He does not exercise due to his shortness of breath. He returns for  reevaluation UPDATE 12/18/2018CM Mr. Kathi LudwigScarborough, 76 year old male returns for yearly follow-up with history of TIA in October 2016 he remains  on aspirin without signs of bleeding and minimal bruising.  He is not had further stroke or TIA symptoms.  He is on Lipitor for hyperlipidemia without myalgias.  He has a long history of restless leg syndrome in good control with Mirapex.  He needs refills.  He has obstructive sleep apnea and uses his CPAP with additional oxygen.  His carotid Dopplers are followed by cardiology, Dr. Kendrick FriesMcQuaid last done 10/23/2016.  He also has a history of idiopathic pulmonary fibrosis and gets short of breath with any exertion.  He is on continuous oxygen.  He returns for reevaluation  REVIEW OF SYSTEMS: Full 14 system review of systems performed and notable only for those listed, all others are neg:  Constitutional: neg  Cardiovascular: neg Ear/Nose/Throat: neg  Skin: neg Eyes: neg Respiratory: Shortness of breath ,cough Gastroitestinal: neg  Hematology/Lymphatic: Easy bruising  Endocrine: Intolerance to cold Musculoskeletal: neg Allergy/Immunology: neg Neurological: neg Psychiatric: Depression Sleep : Restless legs, obstructive sleep apnea    ALLERGIES: Allergies  Allergen Reactions  . Gabapentin     Dizziness   . Sulfonamide Derivatives Rash  . Trazodone And Nefazodone Rash and Other (See Comments)    Hallucinations     HOME MEDICATIONS: Outpatient Medications Prior to Visit  Medication  Sig Dispense Refill  . albuterol (PROVENTIL) (2.5 MG/3ML) 0.083% nebulizer solution Take 3 mLs (2.5 mg total) by nebulization every 6 (six) hours as needed for wheezing or shortness of breath. 360 mL 11  . aspirin 81 MG tablet Take 81 mg by mouth daily.    Marland Kitchen atorvastatin (LIPITOR) 40 MG tablet Take 1 tablet (40 mg total) by mouth daily at 6 PM. 30 tablet 0  . chlorpheniramine-HYDROcodone (TUSSIONEX PENNKINETIC ER) 10-8 MG/5ML SUER Take 5 mLs by mouth every 12 (twelve) hours as needed for cough. 140 mL 0  . DULoxetine (CYMBALTA) 20 MG capsule Take 20 mg by mouth daily.    . Nintedanib (OFEV) 150 MG CAPS Take 150  mg by mouth 2 (two) times daily. 60 capsule   . oxymetazoline (AFRIN) 0.05 % nasal spray Place 1 spray into both nostrils as needed for congestion.    . pramipexole (MIRAPEX) 0.5 MG tablet TAKE 1 TABLET BY MOUTH EVERY MORNING AND TAKE 1 TABLET EVERY EVENING 180 tablet 3  . PROAIR HFA 108 (90 BASE) MCG/ACT inhaler Inhale 2 puffs into the lungs every 6 (six) hours as needed.    Marland Kitchen QUEtiapine (SEROQUEL) 100 MG tablet     . sodium chloride HYPERTONIC 3 % nebulizer solution Take by nebulization 2 (two) times daily as needed for other. 224 mL 12  . triamcinolone cream (KENALOG) 0.1 % APPLY 1 APPLICATION ON THE SKIN TWICE A DAY AS NEEDED  2  . amLODipine (NORVASC) 2.5 MG tablet Take 2.5 mg by mouth daily.    . cyanocobalamin 500 MCG tablet Take 1,000 mcg by mouth daily.    Marland Kitchen doxycycline (VIBRA-TABS) 100 MG tablet Take 1 tablet (100 mg total) by mouth 2 (two) times daily. 10 tablet 0  . furosemide (LASIX) 20 MG tablet Take 1 tab daily prn in addition to 40mg  to keep weight between 195-196lbs 30 tablet 1  . furosemide (LASIX) 40 MG tablet Take 1 tablet (40 mg total) by mouth daily. 5 tablet 0   No facility-administered medications prior to visit.     PAST MEDICAL HISTORY: Past Medical History:  Diagnosis Date  . Acute sinusitis, unspecified   . Arthritis   . 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 GERD (gastroesophageal reflux disease)   . Left anterior fascicular block    hx of on ekg  . Mixed hyperlipidemia   . Obstructive chronic bronchitis with exacerbation (HCC)   . Occlusion and stenosis of carotid artery without mention of cerebral infarction    a. 05/2013 Carotid U/S: 1-39% bilat stenosis.  . Other emphysema (HCC)   . 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 Sleep apnea    does not use cpap due to cough  . Stroke [facility]) 05/2015    PAST SURGICAL HISTORY: Past Surgical History:  Procedure Laterality Date  .  APPENDECTOMY  1956  . BRAVO PH STUDY N/A 05/02/2016   Procedure: BRAVO PH STUDY;  Surgeon: Rachael Fee, MD;  Location: [facility];  Service: Endoscopy;  Laterality: N/A;  . ESOPHAGOGASTRODUODENOSCOPY (EGD) WITH PROPOFOL N/A 05/02/2016   Procedure: ESOPHAGOGASTRODUODENOSCOPY (EGD) WITH PROPOFOL;  Surgeon: Rachael Fee, MD;  Location: [facility];  Service: Endoscopy;  Laterality: N/A;  . EYE SURGERY Bilateral    cataract removal  . JOINT REPLACEMENT Right    shoulder replacement  . LEFT KNEE REPLACEMENT  2001  . RIGHT ANKLE RECONSTRUCTION    . RIGHT  KNEE REPLACEMENT  2005  . RUPTURED DISK  2000  . SPINE SURGERY  2000/2011  . torn retina Left   . TOTAL SHOULDER ARTHROPLASTY Left 06/30/2013   Procedure: TOTAL SHOULDER ARTHROPLASTY;  Surgeon: Loreta Ave, MD;  Location: [facility] OR;  Service: Orthopedics;  Laterality: Left;    FAMILY HISTORY: Family History  Problem Relation Age of Onset  . Emphysema Mother   . Heart disease Mother   . Alzheimer's disease Mother   . Heart disease Father   . Stroke Paternal Grandfather   . Allergies Unknown        FH: FATHER,2 SISTER,1 BROTHER, SON  DAUGHTER  . Cancer Sister     SOCIAL HISTORY: Social History   Socioeconomic History  . Marital status: Married    Spouse name: Myriam Jacobson   . Number of children: 2  . Years of education: Assoc   . Highest education level: Not on file  Social Needs  . Financial resource strain: Not on file  . Food insecurity - worry: Not on file  . Food insecurity - inability: Not on file  . Transportation needs - medical: Not on file  . Transportation needs - non-medical: Not on file  Occupational History  . Occupation: Retired    Associate Professor: RETIRED  Tobacco Use  . Smoking status: Former Smoker    Packs/day: 3.00    Years: 17.00    Pack years: 51.00    Types: Cigarettes    Last attempt to quit: 08/26/1977    Years since quitting: 39.9  . Smokeless tobacco: Never Used  Substance and Sexual Activity  .  Alcohol use: Yes    Comment: rarely  . Drug use: No  . Sexual activity: Not on file  Other Topics Concern  . Not on file  Social History Narrative   Patient is married Myriam Jacobson) and lives at home with his wife.   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    Patient is left handed   Patient drinks 1-2 cups of coffee in the morning and 3-4 cups of tea daily.                  PHYSICAL EXAM  Vitals:   08/12/17 1131  BP: 140/78  Pulse: 77  Weight: 206 lb 3.2 oz (93.5 kg)  Height: 5\' 11"  (1.803 m)   Body mass index is 28.76 kg/m. Generalized: Well developed, Mildly obese male in no acute distress  Head: normocephalic and atraumatic,.  Neck: Supple, no carotid bruits  Cardiac: Regular rate rhythm, no murmur  Musculoskeletal: Arthritic changes in hands  Neurological examination   Mentation: Alert oriented to time, place, history taking. Follows all commands speech and language fluent.  Cranial nerve II-XII: Pupils were equal round reactive to light extraocular movements were full, visual field were full on confrontational test. Facial sensation and strength were normal. hearing was intact to finger rubbing bilaterally. Uvula tongue midline. head turning and shoulder shrug were normal and symmetric.Tongue protrusion into cheek strength was normal. Motor: normal bulk and tone, full strength in the BUE, BLE, fine finger movements normal, no pronator drift.  Sensory: Mildly decreased light touch and pinprick to mid shin bilaterally, vibration normal.   Coordination: finger-nose-finger, heel-to-shin bilaterally, no dysmetria Reflexes: Brachioradialis 2/2, biceps 2/2, triceps 2/2, patellar 0/0, Achilles 1/1, plantar responses were flexor bilaterally. Gait and Station: Rising up from seated position without assistance, normal stance, moderate stride,  short of breath  easily   DIAGNOSTIC DATA (LABS, IMAGING, TESTING) - I reviewed patient  records, labs, notes, testing and imaging myself where available.      Component Value Date/Time   NA 133 (L) 06/11/2017 1115   K 4.0 06/11/2017 1115   CL 98 06/11/2017 1115   CO2 26 06/11/2017 1115   GLUCOSE 106 (H) 06/11/2017 1115   BUN 12 06/11/2017 1115   CREATININE 1.02 06/11/2017 1115   CREATININE 1.13 10/06/2015 0857   CALCIUM 9.2 06/11/2017 1115   PROT 7.2 06/11/2017 1115   ALBUMIN 3.7 06/11/2017 1115   AST 23 06/11/2017 1115   ALT 15 06/11/2017 1115   ALKPHOS 66 06/11/2017 1115   BILITOT 0.6 06/11/2017 1115   GFRNONAA >60 06/01/2015 2038   GFRAA >60 06/01/2015 2038    ASSESSMENT AND PLAN 76 y.o. year old male has a past medical history of restless leg syndrome Occlusion and stenosis of carotid artery without mention of cerebral infarction; Obstructive chronic bronchitis with exacerbation; Other emphysema; Mixed hyperlipidemia; Persistent disorder of initiating or maintaining sleep; Sleep apnea. 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 No further stroke or TIA symptoms and idiopathic pulmonary fibrosis  Continue aspirin for secondary stroke prevention Blood pressure with systolic 130/90 or below today's reading 140/78 Keep LDL goal below 100 continue Lipitor  healthy diet, exercise as tolerated Continue CPAP for obstructive sleep apnea followed by cardiology Continue Mirapex at current dose for restless legs will refill Follow-up with Dr. Pearlean Brownie in 1 year Nilda Riggs, [facility], [facility], APRN  [facility] [address] [place], [place] [address] [phone]

## 2017-08-12 ENCOUNTER — Encounter: Payer: Self-pay | Admitting: Nurse Practitioner

## 2017-08-12 ENCOUNTER — Ambulatory Visit (INDEPENDENT_AMBULATORY_CARE_PROVIDER_SITE_OTHER): Payer: Medicare Other | Admitting: Nurse Practitioner

## 2017-08-12 ENCOUNTER — Telehealth (HOSPITAL_COMMUNITY): Payer: Self-pay | Admitting: Family Medicine

## 2017-08-12 ENCOUNTER — Encounter (HOSPITAL_COMMUNITY): Payer: Medicare Other

## 2017-08-12 VITALS — BP 140/78 | HR 77 | Ht 71.0 in | Wt 206.2 lb

## 2017-08-12 DIAGNOSIS — G459 Transient cerebral ischemic attack, unspecified: Secondary | ICD-10-CM

## 2017-08-12 DIAGNOSIS — E785 Hyperlipidemia, unspecified: Secondary | ICD-10-CM

## 2017-08-12 DIAGNOSIS — I739 Peripheral vascular disease, unspecified: Secondary | ICD-10-CM

## 2017-08-12 DIAGNOSIS — G2581 Restless legs syndrome: Secondary | ICD-10-CM

## 2017-08-12 DIAGNOSIS — I779 Disorder of arteries and arterioles, unspecified: Secondary | ICD-10-CM

## 2017-08-12 DIAGNOSIS — I10 Essential (primary) hypertension: Secondary | ICD-10-CM | POA: Diagnosis not present

## 2017-08-12 MED ORDER — PRAMIPEXOLE DIHYDROCHLORIDE 0.5 MG PO TABS: ORAL_TABLET | ORAL | 3 refills | Status: DC

## 2017-08-12 NOTE — Progress Notes (Signed)
I agree with the above plan 

## 2017-08-12 NOTE — Patient Instructions (Signed)
Continue aspirin for secondary stroke prevention Blood pressure with systolic 130/90 or below today's reading 140/78 Keep LDL goal below 100 continue Lipitor  healthy diet Continue CPAP for obstructive sleep apnea followed by cardiology Continue Mirapex at current dose for restless legs will refill Follow-up with Dr. Pearlean BrownieSethi in 1 year

## 2017-08-13 ENCOUNTER — Telehealth: Payer: Self-pay

## 2017-08-13 ENCOUNTER — Other Ambulatory Visit (INDEPENDENT_AMBULATORY_CARE_PROVIDER_SITE_OTHER): Payer: Medicare Other

## 2017-08-13 ENCOUNTER — Encounter: Payer: Self-pay | Admitting: Pulmonary Disease

## 2017-08-13 ENCOUNTER — Ambulatory Visit (INDEPENDENT_AMBULATORY_CARE_PROVIDER_SITE_OTHER): Payer: Medicare Other | Admitting: Pulmonary Disease

## 2017-08-13 VITALS — BP 142/80 | HR 98 | Ht 67.0 in | Wt 203.0 lb

## 2017-08-13 DIAGNOSIS — J84112 Idiopathic pulmonary fibrosis: Secondary | ICD-10-CM

## 2017-08-13 DIAGNOSIS — F320 Major depressive disorder, single episode, mild: Secondary | ICD-10-CM

## 2017-08-13 DIAGNOSIS — R0602 Shortness of breath: Secondary | ICD-10-CM

## 2017-08-13 DIAGNOSIS — Z5181 Encounter for therapeutic drug level monitoring: Secondary | ICD-10-CM

## 2017-08-13 LAB — HEPATIC FUNCTION PANEL
ALT: 13 U/L (ref 0–53)
AST: 19 U/L (ref 0–37)
Albumin: 3.8 g/dL (ref 3.5–5.2)
Alkaline Phosphatase: 73 U/L (ref 39–117)
BILIRUBIN TOTAL: 1 mg/dL (ref 0.2–1.2)
Bilirubin, Direct: 0.2 mg/dL (ref 0.0–0.3)
Total Protein: 7.6 g/dL (ref 6.0–8.3)

## 2017-08-13 LAB — BRAIN NATRIURETIC PEPTIDE: PRO B NATRI PEPTIDE: 32 pg/mL (ref 0.0–100.0)

## 2017-08-13 MED ORDER — PREDNISONE 20 MG PO TABS: 20.0000 mg | ORAL_TABLET | Freq: Every day | ORAL | 0 refills | Status: DC

## 2017-08-13 MED ORDER — DULOXETINE HCL 20 MG PO CPEP: 40.0000 mg | ORAL_CAPSULE | Freq: Every day | ORAL | 4 refills | Status: DC

## 2017-08-13 NOTE — Patient Instructions (Addendum)
Chronic respiratory failure with hypoxemia Continue using 3 L of oxygen at rest, 5 with walking around, 10 L with exercise  IPF Continue taking 05 twice a day as you are doing We will check a liver function test to make sure there is no evidence of drug toxicity  Sinus congestion Take Afrin twice a day We will prescribe a short course of prednisone to help clear up the inflammation contributing to this  Depression: We will increase your Cymbalta to 40mg  daily  We will see you back after the holidays, 2-4 weeks

## 2017-08-13 NOTE — Progress Notes (Signed)
Subjective:    Patient ID: Jeffrey Cobb, male    DOB: 02-24-1941, 76 y.o.   MRN: [id number]  Synopsis: Former patient of Dr. Shelle Ironlance who has chronic cough and UIP Felt to be due to idiopathic pulmonary fibrosis  Quit smoking in 1979 after 16 years of smoking 2 ppd He startedon oxygen in 09/2016 Started on Ofev in 2018  HPI Chief Complaint  Patient presents with  . Follow-up    3wk rov- pt states breathing has slightly worsen. pt reports of increased sob & prod cough with clear to green mucus.     Jeffrey Cobb is having trouble breathing right now. > mostly just with exertion > some days are worse than other, today is one of those days > he can't walk to his front door to his mailbox without stopping to rest and catch his breath.    Cough:  > he is coughing, but not much more than normal > he produces mucus, but's not too bad  Leg swelling: > not too bad  Weight loss: > he has very little appetite  > he is losing weight because of this  He says that his sinuses are blocked up.  He says that when it's worse he is more symptomatic.  He has seen ENT three times in the past and he's never really gotten better from this.  He has been using saline rinses but this bothers him quite a bit. He has used afrin nearly every day, some days more than others.  He says that pseuodophed doesn't help, neither does phenylephrine.       Past Medical History:  Diagnosis Date  . Acute sinusitis, unspecified   . Arthritis   . 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 GERD (gastroesophageal reflux disease)   . Left anterior fascicular block    hx of on ekg  . Mixed hyperlipidemia   . Obstructive chronic bronchitis with exacerbation (HCC)   . Occlusion and stenosis of carotid artery without mention of cerebral infarction    a. 05/2013 Carotid U/S: 1-39% bilat stenosis.  . Other emphysema (HCC)   . 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 Sleep apnea    does not use cpap due to cough  . Stroke [facility](HCC) 05/2015      Review of Systems  Constitutional: Negative for chills, fatigue and fever.  HENT: Positive for rhinorrhea. Negative for sinus pressure and sneezing.   Respiratory: Positive for cough. Negative for shortness of breath and wheezing.   Cardiovascular: Negative for chest pain, palpitations and leg swelling.       Objective:   Physical Exam Vitals:   08/13/17 1412  BP: (!) 142/80  Pulse: 98  SpO2: 91%  Weight: 203 lb (92.1 kg)  Height: 5\' 7"  (1.702 m)   3L pm pulse  Gen: chronically ill appearing HENT: OP clear, TM's clear, neck supple PULM: Crackles throughout B, normal percussion CV: RRR, no mgr, trace edema GI: BS+, soft, nontender Derm: no cyanosis or rash Psyche: normal mood and affect     CBC    Component Value Date/Time   WBC 6.7 05/31/2015 1730   RBC 4.64 05/31/2015 1730   HGB 14.6 05/31/2015 1730   HCT 41.5 05/31/2015 1730   PLT 138 (L) 05/31/2015 1730   MCV 89.4 05/31/2015 1730   MCH 31.5 05/31/2015 1730   MCHC 35.2 05/31/2015 1730   RDW 13.4 05/31/2015 1730  LYMPHSABS 2.2 09/02/2014 1204   MONOABS 0.7 09/02/2014 1204   EOSABS 0.2 09/02/2014 1204   BASOSABS 0.0 09/02/2014 1204     PFT PFT"s 2009:  FEV1 3.49 (115%), ratio 65, DLCO 67%. PFT's 08/2014:  FEV1 3.13 (114%), ratio 69, decreased airtrapping from 2009, DLCO 49% June 2017 pulmonary function testing FEV1 2.99 L (111% predicted, FVC 3.67 L (98% predicted), total lung capacity 5.53 L (85% predicted), DLCO 12.76 (45% predicted). March 2018 pulmonary function testing ratio 80%, FVC 2.94 L 79% predicted, total lung capacity 4.51 L 69% predicted, DLCO 9.05 31% predicted 05/2017 PFT> FVC 2.6L, DLCO 7.1 24% pred  Imaging: 01/2016 HRCT > traction bronchiolectasis and interlobular septal thickening in a basilar predominance have progressed compared to July 2016, there remains no frank honeycombing. There are  multiple pulmonary nodules which are stable or have resolved. February 2018 high-resolution CT chest independently reviewed showing traction bronchiectasis, interlobular septal thickening and honeycombing worse in the periphery and bases.  6 minute walk test: August 2016 6 minute walk test 336 m O2 sats ration 92% on room air 04/27/2016 6 minute walk distance 292.5 m O2 sats ration and completion of test was 82% on room air. July 2018 6 minute walk: Distance 1231 feet, O2 saturation 88% on 10 L nasal cannula October 2018 distance 216 m  Other testing: September 2017 pH probe showed normal pH and no clear evidence of gastroesophageal reflux. Treated were 2018 echocardiogram normal LVEF, RVSP 38  CPAP compliance report 02/2017> only using 39% of nights  Records from his visit with pulmonary rehabilitation reviewed, he's requiring up to 10 L continuous while working out on a treadmill.     Assessment & Plan:  IPF (idiopathic pulmonary fibrosis) (HCC) - Plan: Ambulatory Referral for DME  Therapeutic drug monitoring - Plan: Hepatic function panel  SOB (shortness of breath) - Plan: B Nat Peptide  Current mild episode of major depressive disorder without prior episode (HCC)  Discussion: Clear to me the roof this is getting worse, his lung function testing has shown this this year and he has needed more oxygen.  I think his acute increase in symptoms today has to do with his sinus congestion which in large part is due to dependence on Afrin and possibly allergy.  I also think that because he has not been using a humidifier with his oxygen at home this is making this problem worse.  In general, I fear that his symptom level is getting to a point to where we need to consider hospice.  I did not talk to him about this directly today because of the holiday, but I think we will need to address it next month.  His depression is worse so we will increase his cymbalta for this today.  Plan:    Chronic respiratory failure with hypoxemia Continue using 3 L of oxygen at rest, 5 with walking around, 10 L with exercise  IPF Continue taking 05 twice a day as you are doing We will check a liver function test to make sure there is no evidence of drug toxicity  Sinus congestion Take Afrin twice a day We will prescribe a short course of prednisone to help clear up the inflammation contributing to this  Depression: We will increase your Cymbalta to 40mg  daily  We will see you back after the holidays, 2-4 weeks   Current Outpatient Medications:  .  albuterol (PROVENTIL) (2.5 MG/3ML) 0.083% nebulizer solution, Take 3 mLs (2.5 mg total) by nebulization every 6 (  six) hours as needed for wheezing or shortness of breath., Disp: 360 mL, Rfl: 11 .  amLODipine (NORVASC) 2.5 MG tablet, Take 2.5 mg by mouth daily., Disp: , Rfl:  .  aspirin 81 MG tablet, Take 81 mg by mouth daily., Disp: , Rfl:  .  atorvastatin (LIPITOR) 40 MG tablet, Take 1 tablet (40 mg total) by mouth daily at 6 PM., Disp: 30 tablet, Rfl: 0 .  chlorpheniramine-HYDROcodone (TUSSIONEX PENNKINETIC ER) 10-8 MG/5ML SUER, Take 5 mLs by mouth every 12 (twelve) hours as needed for cough., Disp: 140 mL, Rfl: 0 .  cyanocobalamin 500 MCG tablet, Take 1,000 mcg by mouth daily., Disp: , Rfl:  .  DULoxetine (CYMBALTA) 20 MG capsule, Take 20 mg by mouth daily., Disp: , Rfl:  .  Nintedanib (OFEV) 150 MG CAPS, Take 150 mg by mouth 2 (two) times daily., Disp: 60 capsule, Rfl:  .  oxymetazoline (AFRIN) 0.05 % nasal spray, Place 1 spray into both nostrils as needed for congestion., Disp: , Rfl:  .  pramipexole (MIRAPEX) 0.5 MG tablet, TAKE 1 TABLET BY MOUTH EVERY MORNING AND TAKE 1 TABLET EVERY EVENING, Disp: 180 tablet, Rfl: 3 .  PROAIR HFA 108 (90 BASE) MCG/ACT inhaler, Inhale 2 puffs into the lungs every 6 (six) hours as needed., Disp: , Rfl:  .  QUEtiapine (SEROQUEL) 100 MG tablet, , Disp: , Rfl:  .  sodium chloride HYPERTONIC 3 % nebulizer  solution, Take by nebulization 2 (two) times daily as needed for other., Disp: 224 mL, Rfl: 12 .  triamcinolone cream (KENALOG) 0.1 %, APPLY 1 APPLICATION ON THE SKIN TWICE A DAY AS NEEDED, Disp: , Rfl: 2

## 2017-08-13 NOTE — Telephone Encounter (Signed)
OFEV patient assistance form has been signed by BQ and and mailed to pt's home address on file, as income information is needed.

## 2017-08-14 ENCOUNTER — Encounter (HOSPITAL_COMMUNITY)
Admission: RE | Admit: 2017-08-14 | Discharge: 2017-08-14 | Disposition: A | Discharge status: Home / Self Care | Payer: Medicare Other | Source: Ambulatory Visit | Attending: Pulmonary Disease | Admitting: Pulmonary Disease

## 2017-08-14 VITALS — Wt 205.0 lb

## 2017-08-14 DIAGNOSIS — J849 Interstitial pulmonary disease, unspecified: Secondary | ICD-10-CM | POA: Diagnosis not present

## 2017-08-14 NOTE — Telephone Encounter (Signed)
Patient returned call, CB is [phone]

## 2017-08-14 NOTE — Telephone Encounter (Signed)
Spoke with pt. He is aware of Margie's message. Nothing further was needed.

## 2017-08-14 NOTE — Progress Notes (Signed)
Jeffrey Cobb 76 y.o. male  DOB: 01-Jan-1941 MRN: [id number]           Nutrition Consult 1. ILD (interstitial lung disease) ([place])    Meds reviewed. Prednisone noted  Spoke with pt. Pt c/o decreased appetite, food not tasting "the same." Pt reports "forgetting to eat lunch." Techniques to help pt remember to eat discussed. Pt encouraged to liberalize his diet while his appetite is poor and allow himself to eat foods he enjoys. Pt states he has been on OFEV for ~1 year and he has "unpredictable diarrhea." Pt expressed understanding of the information reviewed via feedback method.    Nutrition Diagnosis ? Food-and nutrition-related knowledge deficit related to lack of exposure to information as related to diagnosis of pulmonary disease ? 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 Pt's individual nutrition plan reviewed with pt. ? Pt to set an alarm to remind him to eat every 3 hours.  ? Pt to liberalize diet while appetite is poor. ? Pt to consider adding a probiotic due to GI distress with OFEV  Goal(s) continued from previous admission 1. Identify food quantities necessary to achieve wt loss of  -2# per week to a goal wt loss of 2.7-10.9 kg (6-24 lb) at graduation from pulmonary rehab. ? Pt to increase fish consumption to at least 1-2 times/week ? Pt to increase consumption of fruits and vegetables from 0-1 cup per day to 2-3 cups per day. ? Pt to use nuts/peanut butter as a small snack or protein choice 2 times/week or more.   Plan:  Pt to attend Pulmonary Nutrition class - met 02/13/17 Will provide client-centered nutrition education as part of interdisciplinary care.   Monitor and evaluate progress toward nutrition goal with team.  Monitor and Evaluate progress toward nutrition goal with team.   Derek Mound, M.Ed, RD, LDN, CDE 08/14/2017 2:02 PM

## 2017-08-14 NOTE — Progress Notes (Signed)
Daily Session Note  Patient Details  Name: Akai B Redlich MRN: [id number] Date of Birth: 05/02/1941 Referring Provider:     Pulmonary Rehab Walk Test from 04/29/2017 in [facility]  Referring Provider  Dr. McQuaid      Encounter Date: 08/14/2017  Check In: Session Check In - 08/14/17 1113      Check-In   Location  [facility]    Staff Present  Janal Haak, MS, ACSM RCEP, Exercise Physiologist;Lisa Hughes, RN;Portia Payne, RN, BSN    Supervising physician immediately available to respond to emergencies  [facility] immediately available    Physician(s)  Dr. Vega    Medication changes reported      No    Fall or balance concerns reported     No    Tobacco Cessation  No Change    Warm-up and Cool-down  Performed as group-led instruction    Resistance Training Performed  Yes    VAD Patient?  No      Pain Assessment   Currently in Pain?  No/denies    Multiple Pain Sites  No       Capillary Blood Glucose: Results for orders placed or performed in visit on 08/13/17 (from the past 24 hour(s))  B Nat Peptide     Status: None   Collection Time: 08/13/17  2:55 PM  Result Value Ref Range   Pro B Natriuretic peptide (BNP) 32.0 0.0 - 100.0 pg/mL  Hepatic function panel     Status: None   Collection Time: 08/13/17  2:55 PM  Result Value Ref Range   Total Bilirubin 1.0 0.2 - 1.2 mg/dL   Bilirubin, Direct 0.2 0.0 - 0.3 mg/dL   Alkaline Phosphatase 73 39 - 117 U/L   AST 19 0 - 37 U/L   ALT 13 0 - 53 U/L   Total Protein 7.6 6.0 - 8.3 g/dL   Albumin 3.8 3.5 - 5.2 g/dL    Exercise Prescription Changes - 08/14/17 1300      Response to Exercise   Blood Pressure (Admit)  159/97    Blood Pressure (Exercise)  130/80    Blood Pressure (Exit)  118/82    Heart Rate (Admit)  68 bpm    Heart Rate (Exercise)  101 bpm    Heart Rate (Exit)  75 bpm    Oxygen Saturation (Admit)  99 %    Oxygen Saturation (Exercise)  81 %    Oxygen  Saturation (Exit)  98 %    Rating of Perceived Exertion (Exercise)  11    Perceived Dyspnea (Exercise)  1    Duration  Continue with 45 min of aerobic exercise without signs/symptoms of physical distress.    Intensity  THRR unchanged      Progression   Progression  Continue to progress workloads to maintain intensity without signs/symptoms of physical distress.      Resistance Training   Training Prescription  Yes    Weight  blue bands    Reps  10-15    Time  10 Minutes      Interval Training   Interval Training  No      Oxygen   Oxygen  Continuous    Liters  10-15      Treadmill   MPH  1.9    Grade  0    Minutes  30       Social History   Tobacco Use  Smoking Status Former Smoker  .   Packs/day: 3.00  . Years: 17.00  . Pack years: 51.00  . Types: Cigarettes  . Last attempt to quit: 08/26/1977  . Years since quitting: 39.9  Smokeless Tobacco Never Used    Goals Met:  Exercise tolerated well No report of cardiac concerns or symptoms Strength training completed today  Goals Unmet:  Not Applicable  Comments: Service time is from 10:30a to 12:45p    Dr. Wesam G. Yacoub is Medical Director for Pulmonary Rehab at [facility]. 

## 2017-08-14 NOTE — Progress Notes (Signed)
Daily Session Note  Patient Details  Name: OBBIE LEWALLEN MRN: [id number] Date of Birth: [birth year] Referring Provider:     Pulmonary Rehab Walk Test from 04/29/2017 in [place]  Referring Provider  Dr. [name]      Encounter Date: 08/14/2017  Check In: Session Check In - 08/14/17 1113      Check-In   Location  [facility]    Staff Present  Su Hilt, MS, ACSM RCEP, Exercise Physiologist;Lisa Ysidro Evert, RN;Portia Rollene Rotunda, RN, BSN    Supervising physician immediately available to respond to emergencies  [facility] immediately available    Physician(s)  Dr. Wendee Beavers    Medication changes reported      No    Fall or balance concerns reported     No    Tobacco Cessation  No Change    Warm-up and Cool-down  Performed as group-led instruction    Resistance Training Performed  Yes    VAD Patient?  No      Pain Assessment   Currently in Pain?  No/denies    Multiple Pain Sites  No       Capillary Blood Glucose: Results for orders placed or performed in visit on 08/13/17 (from the past 24 hour(s))  B Nat Peptide     Status: None   Collection Time: 08/13/17  2:55 PM  Result Value Ref Range   Pro B Natriuretic peptide (BNP) 32.0 0.0 - 100.0 pg/mL  Hepatic function panel     Status: None   Collection Time: 08/13/17  2:55 PM  Result Value Ref Range   Total Bilirubin 1.0 0.2 - 1.2 mg/dL   Bilirubin, Direct 0.2 0.0 - 0.3 mg/dL   Alkaline Phosphatase 73 39 - 117 U/L   AST 19 0 - 37 U/L   ALT 13 0 - 53 U/L   Total Protein 7.6 6.0 - 8.3 g/dL   Albumin 3.8 3.5 - 5.2 g/dL    Exercise Prescription Changes - 08/14/17 1300      Response to Exercise   Blood Pressure (Admit)  159/97    Blood Pressure (Exercise)  130/80    Blood Pressure (Exit)  118/82    Heart Rate (Admit)  68 bpm    Heart Rate (Exercise)  101 bpm    Heart Rate (Exit)  75 bpm    Oxygen Saturation (Admit)  99 %    Oxygen Saturation (Exercise)  81 %    Oxygen  Saturation (Exit)  98 %    Rating of Perceived Exertion (Exercise)  11    Perceived Dyspnea (Exercise)  1    Duration  Continue with 45 min of aerobic exercise without signs/symptoms of physical distress.    Intensity  THRR unchanged      Progression   Progression  Continue to progress workloads to maintain intensity without signs/symptoms of physical distress.      Resistance Training   Training Prescription  Yes    Weight  blue bands    Reps  10-15    Time  10 Minutes      Interval Training   Interval Training  No      Oxygen   Oxygen  Continuous    Liters  10-15      Treadmill   MPH  1.9    Grade  0    Minutes  30       Social History   Tobacco Use  Smoking Status Former Smoker  .  Packs/day: 3.00  . Years: 17.00  . Pack years: 51.00  . Types: Cigarettes  . Last attempt to quit: 08/26/1977  . Years since quitting: 39.9  Smokeless Tobacco Never Used    Goals Met:  Exercise tolerated well No report of cardiac concerns or symptoms Strength training completed today  Goals Unmet:  Not Applicable  Comments: Service time is from 10:30a to 12:45p    Dr. Rush Farmer is Medical Director for Pulmonary Rehab at [facility]

## 2017-08-15 ENCOUNTER — Telehealth: Payer: Self-pay | Admitting: Nurse Practitioner

## 2017-08-15 MED ORDER — PRAMIPEXOLE DIHYDROCHLORIDE 0.5 MG PO TABS: ORAL_TABLET | ORAL | 3 refills | Status: AC

## 2017-08-15 NOTE — Telephone Encounter (Addendum)
I redid the mirapex to [facility].  (for a year).  I spoke to pt and relayed that redid to [facility].

## 2017-08-15 NOTE — Addendum Note (Signed)
Addended by: Guy BeginYOUNG, Jeanclaude Wentworth S on: 08/15/2017 12:45 PM   Modules accepted: Orders

## 2017-08-15 NOTE — Telephone Encounter (Signed)
Pt states that for years he has always gotten his medication from  [facility] [address] [place]- [place], [place] - [address] [phone] (Phone) [phone] (Fax)   He said he receiced an email from [facility] stating in bold that action needs to be completed so he can get his medication.  Pt is asking for a call to know if there is anyway it can just continue to be received thru  [id number] [place]- [place], [place] - [address] [phone] (Phone) [phone] (Fax)

## 2017-08-18 NOTE — Progress Notes (Signed)
Spoke with pt and notified of results per Dr.McQuaid. Pt verbalized understanding and denied any questions. 

## 2017-08-21 ENCOUNTER — Telehealth (HOSPITAL_COMMUNITY): Payer: Self-pay | Admitting: Family Medicine

## 2017-08-21 ENCOUNTER — Encounter (HOSPITAL_COMMUNITY)
Admission: RE | Admit: 2017-08-21 | Discharge: 2017-08-21 | Disposition: A | Discharge status: Home / Self Care | Payer: Medicare Other | Source: Ambulatory Visit | Attending: Pulmonary Disease | Admitting: Pulmonary Disease

## 2017-08-21 DIAGNOSIS — J849 Interstitial pulmonary disease, unspecified: Secondary | ICD-10-CM

## 2017-08-21 NOTE — Progress Notes (Signed)
Pulmonary Individual Treatment Plan  Patient Details  Name: Jeffrey Cobb MRN: [id number] Date of Birth: [birth year] Referring Provider:     Pulmonary Rehab Walk Test from 04/29/2017 in [facility]  Referring Provider  Dr. [name]      Initial Encounter Date:    Pulmonary Rehab Walk Test from 04/29/2017 in [facility]  Date  04/29/17  Referring Provider  Dr. [name]      Visit Diagnosis: ILD (interstitial lung disease) ([place])  Patient's Home Medications on Admission:   Current Outpatient Medications:  .  albuterol (PROVENTIL) (2.5 MG/3ML) 0.083% nebulizer solution, Take 3 mLs (2.5 mg total) by nebulization every 6 (six) hours as needed for wheezing or shortness of breath., Disp: 360 mL, Rfl: 11 .  amLODipine (NORVASC) 2.5 MG tablet, Take 2.5 mg by mouth daily., Disp: , Rfl:  .  aspirin 81 MG tablet, Take 81 mg by mouth daily., Disp: , Rfl:  .  atorvastatin (LIPITOR) 40 MG tablet, Take 1 tablet (40 mg total) by mouth daily at 6 PM., Disp: 30 tablet, Rfl: 0 .  chlorpheniramine-HYDROcodone (TUSSIONEX PENNKINETIC ER) 10-8 MG/5ML SUER, Take 5 mLs by mouth every 12 (twelve) hours as needed for cough., Disp: 140 mL, Rfl: 0 .  cyanocobalamin 500 MCG tablet, Take 1,000 mcg by mouth daily., Disp: , Rfl:  .  DULoxetine (CYMBALTA) 20 MG capsule, Take 2 capsules (40 mg total) by mouth daily., Disp: 60 capsule, Rfl: 4 .  Nintedanib (OFEV) 150 MG CAPS, Take 150 mg by mouth 2 (two) times daily., Disp: 60 capsule, Rfl:  .  oxymetazoline (AFRIN) 0.05 % nasal spray, Place 1 spray into both nostrils as needed for congestion., Disp: , Rfl:  .  pramipexole (MIRAPEX) 0.5 MG tablet, TAKE 1 TABLET BY MOUTH EVERY MORNING AND TAKE 1 TABLET EVERY EVENING, Disp: 180 tablet, Rfl: 3 .  predniSONE (DELTASONE) 20 MG tablet, Take 1 tablet (20 mg total) by mouth daily with breakfast., Disp: 5 tablet, Rfl: 0 .  PROAIR HFA 108 (90 BASE) MCG/ACT inhaler, Inhale 2  puffs into the lungs every 6 (six) hours as needed., Disp: , Rfl:  .  QUEtiapine (SEROQUEL) 100 MG tablet, , Disp: , Rfl:  .  sodium chloride HYPERTONIC 3 % nebulizer solution, Take by nebulization 2 (two) times daily as needed for other., Disp: 224 mL, Rfl: 12 .  triamcinolone cream (KENALOG) 0.1 %, APPLY 1 APPLICATION ON THE SKIN TWICE A DAY AS NEEDED, Disp: , Rfl: 2  Past Medical History: Past Medical History:  Diagnosis Date  . Acute sinusitis, unspecified   . Arthritis   . 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 GERD (gastroesophageal reflux disease)   . Left anterior fascicular block    hx of on ekg  . Mixed hyperlipidemia   . Obstructive chronic bronchitis with exacerbation ([place])   . Occlusion and stenosis of carotid artery without mention of cerebral infarction    a. 05/2013 Carotid U/S: 1-39% bilat stenosis.  . Other emphysema ([place])   . 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 Sleep apnea    does not use cpap due to cough  . Stroke [facility]) 05/2015    Tobacco Use: Social History   Tobacco Use  Smoking Status Former Smoker  . Packs/day: 3.00  . Years: 17.00  . Pack years: 51.00  . Types: Cigarettes  . Last attempt to quit:  08/26/1977  . Years since quitting: 40.0  Smokeless Tobacco Never Used    Labs: Recent Chemical engineer    Labs for ITP Cardiac and Pulmonary Rehab Latest Ref Rng & Units 10/27/2007 11/01/2008 01/17/2011 06/01/2015   Cholestrol 0 - 200 mg/dL 175 169 165 170   LDLCALC 0 - 99 mg/dL 108(H) 105(H) 90 103(H)   HDL >40 mg/dL 57.8 52.5 67.40 58   Trlycerides <150 mg/dL 44 59 40.0 46   Hemoglobin A1c 4.8 - 5.6 % - - - 5.7(H)      Capillary Blood Glucose: No results found for: GLUCAP   Pulmonary Assessment Scores: Pulmonary Assessment Scores    Row Name 02/27/17 1537         ADL UCSD   ADL Phase  Exit     SOB Score total  35       CAT Score   CAT Score  15 Exit         Pulmonary Function Assessment: Pulmonary Function Assessment - 04/25/17 1543      Breath   Bilateral Breath Sounds  -- clear upper fine crackles lower    Shortness of Breath  Yes;Limiting activity       Exercise Target Goals:    Exercise Program Goal: Individual exercise prescription set with THRR, safety & activity barriers. Participant demonstrates ability to understand and report RPE using BORG scale, to self-measure pulse accurately, and to acknowledge the importance of the exercise prescription.  Exercise Prescription Goal: Starting with aerobic activity 30 plus minutes a day, 3 days per week for initial exercise prescription. Provide home exercise prescription and guidelines that participant acknowledges understanding prior to discharge.  Activity Barriers & Risk Stratification: Activity Barriers & Cardiac Risk Stratification - 04/25/17 1512      Activity Barriers & Cardiac Risk Stratification   Activity Barriers  Joint Problems;Arthritis;Back Problems;Deconditioning;Shortness of Breath;Left Knee Replacement;Right Knee Replacement;Neck/Spine Problems       6 Minute Walk: 6 Minute Walk    Row Name 03/04/17 1228 04/29/17 1619       6 Minute Walk   Phase  Discharge  Discharge    Distance  1231 feet  1300 feet    Distance % Change  0.08 %  -    Walk Time  6 minutes  6 minutes    # of Rest Breaks  0  0    MPH  2.33  2.46    METS  2.76  2.84    RPE  11  11    Perceived Dyspnea   1  1    Symptoms  No  Yes (comment)    Comments  -  USED WHEELCHAIR    Resting HR  98 bpm  101 bpm    Resting BP  122/70  150/92    Resting Oxygen Saturation   -  95 %    Exercise Oxygen Saturation  during 6 min walk  -  87 %    Max Ex. HR  160 bpm  118 bpm    Max Ex. BP  138/78  155/92    2 Minute Post BP  112/76  -      Interval HR   Baseline HR (retired)  98  -    1 Minute HR  108  101    2 Minute HR  112  104    3 Minute HR  117  118    4 Minute HR  123  -    5  Minute HR  153   115    6 Minute HR  160  112    2 Minute Post HR  108  111    Interval Heart Rate?  Yes  Yes      Interval Oxygen   Interval Oxygen?  Yes  Yes    Baseline Oxygen Saturation %  98 %  95 %    Resting Liters of Oxygen  8 L  -    1 Minute Oxygen Saturation %  92 %  93 %    1 Minute Liters of Oxygen  8 L  6 L    2 Minute Oxygen Saturation %  87 %  87 %    2 Minute Liters of Oxygen  8 L  6 L    3 Minute Oxygen Saturation %  86 %  86 %    3 Minute Liters of Oxygen  8 L  8 L    4 Minute Oxygen Saturation %  86 %  87 %    4 Minute Liters of Oxygen  10 L  8 L    5 Minute Oxygen Saturation %  88 %  87 %    5 Minute Liters of Oxygen  10 L  8 L    6 Minute Oxygen Saturation %  88 %  87 %    6 Minute Liters of Oxygen  10 L  8 L    2 Minute Post Oxygen Saturation %  93 %  90 %    2 Minute Post Liters of Oxygen  10 L  8 L       Oxygen Initial Assessment: Oxygen Initial Assessment - 04/29/17 1629      Initial 6 min Walk   Oxygen Used  Continuous;E-Tanks    Liters per minute  8      Program Oxygen Prescription   Program Oxygen Prescription  Continuous;E-Tanks    Liters per minute  8       Oxygen Re-Evaluation: Oxygen Re-Evaluation    Row Name 02/25/17 1549 05/16/17 1127 06/13/17 1115 07/07/17 1023 07/08/17 1613     Program Oxygen Prescription   Program Oxygen Prescription  Continuous  Continuous;E-Tanks  Continuous;E-Tanks  Continuous;E-Tanks  -   Liters per minute  _0 -   Comments  he has had an increase to 8 lpm when walking the track to maintain an oxygen saturation >88-90%  he has had to increase to 10 lpm while walking on the treadmill to maintain oxygen saturations greater than 88  -  has progressed to needing 10 lpm with all exercises  had to progress to 15 lpm with treadmill walking at most recent exercise session for desaturation to 86% on 10 liters     Home Oxygen   Home Oxygen Device  Home Concentrator;E-Tanks  Home Concentrator;E-Tanks;Portable Concentrator   Home Concentrator;E-Tanks;Portable Concentrator  Home Concentrator;E-Tanks;Portable Concentrator  -   Sleep Oxygen Prescription  None  CPAP currently working physician to identify possible need for overnight oximetry to determine need for nighttime oxygen bleedin to cpap  CPAP  CPAP  -   Liters per minute  -  -  -  4  -   Home Exercise Oxygen Prescription  Continuous  Continuous working to get new home concentrator that will accomodate 10lpm continuous flow  Continuous order placed this week for DME to replace patients home concentrator with one that will accomodate 10lpm  Continuous  -  Liters per minute  4  10 while walking on his treadmill  10  10  -   Home at Rest Exercise Oxygen Prescription  None  None  Continuous  Continuous  -   Liters per minute  -  -  2  4  -   Compliance with Home Oxygen Use  Yes  Yes  Yes  Yes  -     Goals/Expected Outcomes   Short Term Goals  To learn and exhibit compliance with exercise, home and travel O2 prescription;To learn and understand importance of monitoring SPO2 with pulse oximeter and demonstrate accurate use of the pulse oximeter.;To Learn and understand importance of maintaining oxygen saturations>88%;To learn and demonstrate proper purse lipped breathing techniques or other breathing techniques.;To learn and demonstrate proper use of respiratory medications  To learn and exhibit compliance with exercise, home and travel O2 prescription;To learn and understand importance of monitoring SPO2 with pulse oximeter and demonstrate accurate use of the pulse oximeter.;To learn and understand importance of maintaining oxygen saturations>88%;To learn and demonstrate proper pursed lip breathing techniques or other breathing techniques.  To learn and exhibit compliance with exercise, home and travel O2 prescription;To learn and understand importance of monitoring SPO2 with pulse oximeter and demonstrate accurate use of the pulse oximeter.;To learn and understand  importance of maintaining oxygen saturations>88%;To learn and demonstrate proper pursed lip breathing techniques or other breathing techniques.  To learn and exhibit compliance with exercise, home and travel O2 prescription;To learn and understand importance of monitoring SPO2 with pulse oximeter and demonstrate accurate use of the pulse oximeter.;To learn and understand importance of maintaining oxygen saturations>88%;To learn and demonstrate proper pursed lip breathing techniques or other breathing techniques.  -   Long  Term Goals  Exhibits compliance with exercise, home and travel O2 prescription;Maintenance of O2 saturations>88%;Compliance with respiratory medication  Exhibits compliance with exercise, home and travel O2 prescription;Maintenance of O2 saturations>88%;Compliance with respiratory medication;Verbalizes importance of monitoring SPO2 with pulse oximeter and return demonstration;Exhibits proper breathing techniques, such as pursed lip breathing or other method taught during program session  Exhibits compliance with exercise, home and travel O2 prescription;Maintenance of O2 saturations>88%;Compliance with respiratory medication;Verbalizes importance of monitoring SPO2 with pulse oximeter and return demonstration;Exhibits proper breathing techniques, such as pursed lip breathing or other method taught during program session  Exhibits compliance with exercise, home and travel O2 prescription;Maintenance of O2 saturations>88%;Compliance with respiratory medication;Verbalizes importance of monitoring SPO2 with pulse oximeter and return demonstration;Exhibits proper breathing techniques, such as pursed lip breathing or other method taught during program session  -   Comments  patient verbalizing compliance with home O2. Patient observed wearing his home o2 into rehab. he is worried about his increased need for more oxygen as his workloads increase.  patient verbalizes compliance with home o2  however barriers to correct use have been identified. Patient does not have adequate oxygen systems at home to maintain oxygen during sleep and with exercise  barriers have been addressed this week by pulmonologist and orders are in place for new high flow home concentrator and o2 bleed in with CPAP  patient has recieved new high flow home concentrator and oxygen bleedin for his CPAP  -   Goals/Expected Outcomes  -  patient will have adequate home oxygen delivery systems to accomodate life needs.  patient will have adequate home oxygen delivery systems to accomodate life needs.  -  -   Row Name 07/28/17 [phone] 08/21/17 [phone]  Program Oxygen Prescription   Program Oxygen Prescription  Continuous;E-Tanks  Continuous;E-Tanks      Liters per minute  10  10      Comments  had to progress to 15 lpm with treadmill walking at most recent exercise session for desaturation to 86% on 10 liters  had to progress to 15 lpm with treadmill walking at most recent exercise session for desaturation to 86% on 10 liters        Home Oxygen   Home Oxygen Device  Home Concentrator;E-Tanks;Portable Concentrator  Home Concentrator;E-Tanks;Portable Concentrator      Sleep Oxygen Prescription  CPAP  CPAP      Liters per minute  4  4      Home Exercise Oxygen Prescription  Continuous  -      Liters per minute  10  10      Home at Rest Exercise Oxygen Prescription  Continuous  Continuous      Liters per minute  4  4      Compliance with Home Oxygen Use  Yes  Yes        Goals/Expected Outcomes   Short Term Goals  To learn and exhibit compliance with exercise, home and travel O2 prescription;To learn and understand importance of monitoring SPO2 with pulse oximeter and demonstrate accurate use of the pulse oximeter.;To learn and understand importance of maintaining oxygen saturations>88%;To learn and demonstrate proper pursed lip breathing techniques or other breathing techniques.  To learn and exhibit compliance with  exercise, home and travel O2 prescription;To learn and understand importance of monitoring SPO2 with pulse oximeter and demonstrate accurate use of the pulse oximeter.;To learn and understand importance of maintaining oxygen saturations>88%;To learn and demonstrate proper pursed lip breathing techniques or other breathing techniques.      Long  Term Goals  Exhibits compliance with exercise, home and travel O2 prescription;Maintenance of O2 saturations>88%;Compliance with respiratory medication;Verbalizes importance of monitoring SPO2 with pulse oximeter and return demonstration;Exhibits proper breathing techniques, such as pursed lip breathing or other method taught during program session  Exhibits compliance with exercise, home and travel O2 prescription;Maintenance of O2 saturations>88%;Compliance with respiratory medication;Verbalizes importance of monitoring SPO2 with pulse oximeter and return demonstration;Exhibits proper breathing techniques, such as pursed lip breathing or other method taught during program session      Comments  patient has adequate home oxygen to maintain healthy active social lifestyle  patient has adequate home oxygen to maintain healthy active social lifestyle      Goals/Expected Outcomes  Goal met  Goal met         Oxygen Discharge (Final Oxygen Re-Evaluation): Oxygen Re-Evaluation - 08/21/17 0822      Program Oxygen Prescription   Program Oxygen Prescription  Continuous;E-Tanks    Liters per minute  10    Comments  had to progress to 15 lpm with treadmill walking at most recent exercise session for desaturation to 86% on 10 liters      Home Oxygen   Home Oxygen Device  Home Concentrator;E-Tanks;Portable Concentrator    Sleep Oxygen Prescription  CPAP    Liters per minute  4    Liters per minute  10    Home at Rest Exercise Oxygen Prescription  Continuous    Liters per minute  4    Compliance with Home Oxygen Use  Yes      Goals/Expected Outcomes   Short Term  Goals  To learn and exhibit compliance with exercise, home and travel O2 prescription;To learn  and understand importance of monitoring SPO2 with pulse oximeter and demonstrate accurate use of the pulse oximeter.;To learn and understand importance of maintaining oxygen saturations>88%;To learn and demonstrate proper pursed lip breathing techniques or other breathing techniques.    Long  Term Goals  Exhibits compliance with exercise, home and travel O2 prescription;Maintenance of O2 saturations>88%;Compliance with respiratory medication;Verbalizes importance of monitoring SPO2 with pulse oximeter and return demonstration;Exhibits proper breathing techniques, such as pursed lip breathing or other method taught during program session    Comments  patient has adequate home oxygen to maintain healthy active social lifestyle    Goals/Expected Outcomes  Goal met       Initial Exercise Prescription: Initial Exercise Prescription - 04/29/17 1600      Date of Initial Exercise RX and Referring Provider   Date  04/29/17    Referring Provider  Dr. [name]      Oxygen   Oxygen  Continuous    Liters  8      Treadmill   MPH  1.8    Grade  0    Minutes  17      Bike   Level  1    Minutes  17      NuStep   Level  5    Minutes  17    METs  2      Prescription Details   Frequency (times per week)  2    Duration  Progress to 45 minutes of aerobic exercise without signs/symptoms of physical distress      Intensity   THRR 40-80% of Max Heartrate  58-115    Ratings of Perceived Exertion  11-13    Perceived Dyspnea  0-4      Progression   Progression  Continue progressive overload as per policy without signs/symptoms or physical distress.      Resistance Training   Training Prescription  Yes    Weight  Blue bands    Reps  10-15       Perform Capillary Blood Glucose checks as needed.  Exercise Prescription Changes: Exercise Prescription Changes    Row Name 02/25/17 1200 02/27/17 1200  05/06/17 1200 05/13/17 1600 05/15/17 1200     Response to Exercise   Blood Pressure (Admit)  120/70  122/70  122/80  130/74  118/68   Blood Pressure (Exercise)  150/70  150/80  140/90  150/72  142/82   Blood Pressure (Exit)  100/70  114/70  118/72  110/64  118/60   Heart Rate (Admit)  93 bpm  92 bpm  84 bpm  78 bpm  80 bpm   Heart Rate (Exercise)  110 bpm  107 bpm  99 bpm  96 bpm  88 bpm   Heart Rate (Exit)  95 bpm  92 bpm  80 bpm  87 bpm  78 bpm   Oxygen Saturation (Admit)  96 %  98 %  93 %  92 %  92 %   Oxygen Saturation (Exercise)  83 % O2 increased to 8L sat improved to 87% on TM  89 %  86 %  86 %  88 %   Oxygen Saturation (Exit)  96 %  96 %  95 %  91 %  99 %   Rating of Perceived Exertion (Exercise)  _0 Perceived Dyspnea (Exercise)  1  0  0  2  0   Duration  Continue with 45 min of aerobic  exercise without signs/symptoms of physical distress.  Continue with 45 min of aerobic exercise without signs/symptoms of physical distress.  Continue with 45 min of aerobic exercise without signs/symptoms of physical distress.  Continue with 45 min of aerobic exercise without signs/symptoms of physical distress.  Continue with 45 min of aerobic exercise without signs/symptoms of physical distress.   Intensity  THRR unchanged  THRR unchanged  THRR unchanged  THRR unchanged  THRR unchanged     Progression   Progression  Continue to progress workloads to maintain intensity without signs/symptoms of physical distress.  Continue to progress workloads to maintain intensity without signs/symptoms of physical distress.  Continue to progress workloads to maintain intensity without signs/symptoms of physical distress.  Continue to progress workloads to maintain intensity without signs/symptoms of physical distress.  Continue to progress workloads to maintain intensity without signs/symptoms of physical distress.     Resistance Training   Training Prescription  Yes  Yes  Yes  Yes  Yes   Weight   blue bands  blue bands  blue bands  blue bands  blue bands   Reps  10-15  10-15  10-15  10-15  10-15   Time  10 Minutes  10 Minutes  10 Minutes  10 Minutes  10 Minutes     Interval Training   Interval Training  No  No  No  No  No     Oxygen   Oxygen  Continuous  Continuous  Continuous  Continuous  Continuous   Liters  _0 Treadmill   MPH  1.8  1.8  1.8  1.8  1.8   Grade  0  0  0  0  0   Minutes  _1 Bike   Level  1  1.2  -  1  -   Minutes  17  17  -  17  -     NuStep   Level  7  -  _2 Minutes  17  -  _3 METs  2.6  -  2.1  2.1  2.1   Row Name 05/20/17 1229 05/22/17 1200 05/27/17 1200 06/03/17 1200 06/19/17 1200     Response to Exercise   Blood Pressure (Admit)  120/74  120/70  140/70  146/80  138/80   Blood Pressure (Exercise)  132/88  156/82  132/66  118/70  144/80   Blood Pressure (Exit)  104/72  114/70  118/68  120/80  122/80   Heart Rate (Admit)  81 bpm  89 bpm  88 bpm  99 bpm  77 bpm   Heart Rate (Exercise)  99 bpm  104 bpm  100 bpm  112 bpm  102 bpm   Heart Rate (Exit)  83 bpm  81 bpm  80 bpm  91 bpm  80 bpm   Oxygen Saturation (Admit)  90 %  91 %  98 %  99 %  95 %   Oxygen Saturation (Exercise)  86 %  87 %  88 %  87 %  84 %   Oxygen Saturation (Exit)  99 %  98 %  99 %  98 %  99 %   Rating of Perceived Exertion (Exercise)  _4 Perceived Dyspnea (Exercise)  1  _0 Duration  Continue with 45 min of aerobic exercise without signs/symptoms of physical distress.  Continue with 45 min of aerobic exercise without signs/symptoms of physical distress.  Continue with 45 min of aerobic exercise without signs/symptoms of physical distress.  Continue with 45 min of aerobic exercise without signs/symptoms of physical distress.  Continue with 45 min of aerobic exercise without signs/symptoms of physical distress.   Intensity  THRR unchanged  THRR unchanged  THRR unchanged  THRR unchanged  THRR unchanged      Progression   Progression  Continue to progress workloads to maintain intensity without signs/symptoms of physical distress.  Continue to progress workloads to maintain intensity without signs/symptoms of physical distress.  Continue to progress workloads to maintain intensity without signs/symptoms of physical distress.  Continue to progress workloads to maintain intensity without signs/symptoms of physical distress.  Continue to progress workloads to maintain intensity without signs/symptoms of physical distress.     Resistance Training   Training Prescription  Yes  Yes  Yes  Yes  Yes   Weight  blue bands  blue bands  blue bands  blue bands  blue bands   Reps  10-15  10-15  10-15  10-15  10-15   Time  10 Minutes  10 Minutes  10 Minutes  10 Minutes  10 Minutes     Interval Training   Interval Training  No  No  No  No  No     Oxygen   Oxygen  Continuous  Continuous  Continuous  Continuous  Continuous   Liters  _1 Treadmill   MPH  1.8  1.8  1.8  1.8  1.8   Grade  0  0  0  0  0   Minutes  _2 Bike   Level  1  -  1  -  -   Minutes  17  -  17  -  -     NuStep   Level  _3 workload decreased not feeling well today  5  5   Minutes  _4 METs  2.4  2.3  2.3  2.4  2.4   Row Name 07/03/17 1300 07/08/17 1200 07/22/17 1200 07/24/17 1300 07/29/17 1200     Response to Exercise   Blood Pressure (Admit)  132/70  110/76  150/80  140/90  130/90   Blood Pressure (Exercise)  118/80  126/70  140/84  144/56  130/70   Blood Pressure (Exit)  112/62  108/76  100/72  112/74  116/70   Heart Rate (Admit)  83 bpm  84 bpm  90 bpm  83 bpm  90 bpm   Heart Rate (Exercise)  99 bpm  107 bpm  107 bpm  93 bpm  109 bpm   Heart Rate (Exit)  86 bpm  98 bpm  90 bpm  82 bpm  99 bpm   Oxygen Saturation (Admit)  96 %  96 %  97 %  94 %  97 %   Oxygen Saturation (Exercise)  92 %  86 % oxygen increased to 15L sat improved to 90%  87 % oxygen increased to 15L sat  improved to 90%  83 % oxygen increased to 15L sat improved to 90%  92 %   Oxygen Saturation (Exit)  98 %  98 %  90 %  93 %  98 %   Rating of Perceived Exertion (Exercise)  _0 Perceived Dyspnea (Exercise)  0  _1 Duration  Continue with 45 min of aerobic exercise without signs/symptoms of physical distress.  Continue with 45 min of aerobic exercise without signs/symptoms of physical distress.  Continue with 45 min of aerobic exercise without signs/symptoms of physical distress.  Continue with 45 min of aerobic exercise without signs/symptoms of physical distress.  Continue with 45 min of aerobic exercise without signs/symptoms of physical distress.   Intensity  THRR unchanged  THRR unchanged  THRR unchanged  THRR unchanged  THRR unchanged     Progression   Progression  Continue to progress workloads to maintain intensity without signs/symptoms of physical distress.  Continue to progress workloads to maintain intensity without signs/symptoms of physical distress.  Continue to progress workloads to maintain intensity without signs/symptoms of physical distress.  Continue to progress workloads to maintain intensity without signs/symptoms of physical distress.  Continue to progress workloads to maintain intensity without signs/symptoms of physical distress.     Resistance Training   Training Prescription  Yes  Yes  Yes  Yes  Yes   Weight  blue bands  blue bands  blue bands  blue bands  blue bands   Reps  10-15  10-15  10-15  10-15  10-15   Time  10 Minutes  10 Minutes  10 Minutes  10 Minutes  10 Minutes     Interval Training   Interval Training  No  No  No  No  No     Oxygen   Oxygen  Continuous  Continuous  Continuous  Continuous  Continuous   Liters  10  10-15  10-15  10-15  10-15     Treadmill   MPH  -  1.8  1.8  1.9  1.8   Grade  -  0  0  1  0   Minutes  -  _2 Bike   Level  _3 0.5  1   Minutes  _4 NuStep   Level  _5 -  6   Minutes  _6 -  17   METs  2.3  2  2.5  -  2     Home Exercise Plan   Plans to continue exercise at  -  Home (comment)  -  -  -   Frequency  -  Add 3 additional days to program exercise sessions.  -  -  -   Row Name 08/14/17 1300             Response to Exercise   Blood Pressure (Admit)  159/97       Blood Pressure (Exercise)  130/80       Blood Pressure (Exit)  118/82       Heart Rate (Admit)  68 bpm       Heart Rate (Exercise)  101 bpm       Heart Rate (Exit)  75 bpm       Oxygen Saturation (Admit)  99 %  Oxygen Saturation (Exercise)  81 %       Oxygen Saturation (Exit)  98 %       Rating of Perceived Exertion (Exercise)  11       Perceived Dyspnea (Exercise)  1       Duration  Continue with 45 min of aerobic exercise without signs/symptoms of physical distress.       Intensity  THRR unchanged         Progression   Progression  Continue to progress workloads to maintain intensity without signs/symptoms of physical distress.         Resistance Training   Training Prescription  Yes       Weight  blue bands       Reps  10-15       Time  10 Minutes         Interval Training   Interval Training  No         Oxygen   Oxygen  Continuous       Liters  10-15         Treadmill   MPH  1.9       Grade  0       Minutes  30          Exercise Comments: Exercise Comments    Row Name 07/10/17 0745           Exercise Comments  Home exercise completed          Exercise Goals and Review: Exercise Goals    Row Name 04/25/17 1514             Exercise Goals   Increase Physical Activity  Yes       Intervention  Provide advice, education, support and counseling about physical activity/exercise needs.;Develop an individualized exercise prescription for aerobic and resistive training based on initial evaluation findings, risk stratification, comorbidities and participant's personal goals.       Expected Outcomes  Achievement of increased  cardiorespiratory fitness and enhanced flexibility, muscular endurance and strength shown through measurements of functional capacity and personal statement of participant.       Increase Strength and Stamina  Yes       Intervention  Provide advice, education, support and counseling about physical activity/exercise needs.;Develop an individualized exercise prescription for aerobic and resistive training based on initial evaluation findings, risk stratification, comorbidities and participant's personal goals.       Expected Outcomes  Achievement of increased cardiorespiratory fitness and enhanced flexibility, muscular endurance and strength shown through measurements of functional capacity and personal statement of participant.       Able to understand and use rate of perceived exertion (RPE) scale  Yes       Intervention  Provide education and explanation on how to use RPE scale       Expected Outcomes  Short Term: Able to use RPE daily in rehab to express subjective intensity level;Long Term:  Able to use RPE to guide intensity level when exercising independently       Able to understand and use Dyspnea scale  Yes       Intervention  Provide education and explanation on how to use Dyspnea scale       Expected Outcomes  Short Term: Able to use Dyspnea scale daily in rehab to express subjective sense of shortness of breath during exertion;Long Term: Able to use Dyspnea scale to guide intensity level when exercising independently  Knowledge and understanding of Target Heart Rate Range (THRR)  Yes       Intervention  Provide education and explanation of THRR including how the numbers were predicted and where they are located for reference       Expected Outcomes  Short Term: Able to state/look up THRR;Long Term: Able to use THRR to govern intensity when exercising independently;Short Term: Able to use daily as guideline for intensity in rehab       Understanding of Exercise Prescription  Yes        Intervention  Provide education, explanation, and written materials on patient's individual exercise prescription       Expected Outcomes  Short Term: Able to explain program exercise prescription;Long Term: Able to explain home exercise prescription to exercise independently          Exercise Goals Re-Evaluation : Exercise Goals Re-Evaluation    Row Name 02/25/17 1556 05/12/17 1335 06/14/17 1406 07/10/17 1615 07/28/17 1635     Exercise Goal Re-Evaluation   Exercise Goals Review  Increase Strenth and Stamina;Increase Physical Activity  Increase Strength and Stamina;Able to understand and use Dyspnea scale;Able to understand and use rate of perceived exertion (RPE) scale;Increase Physical Activity;Knowledge and understanding of Target Heart Rate Range (THRR);Understanding of Exercise Prescription  Increase Physical Activity;Increase Strength and Stamina;Able to understand and use Dyspnea scale;Able to understand and use rate of perceived exertion (RPE) scale;Knowledge and understanding of Target Heart Rate Range (THRR);Understanding of Exercise Prescription  Increase Physical Activity;Increase Strength and Stamina;Able to understand and use Dyspnea scale;Able to understand and use rate of perceived exertion (RPE) scale;Knowledge and understanding of Target Heart Rate Range (THRR);Understanding of Exercise Prescription  Increase Physical Activity;Increase Strength and Stamina;Able to understand and use Dyspnea scale;Able to understand and use rate of perceived exertion (RPE) scale;Knowledge and understanding of Target Heart Rate Range (THRR);Understanding of Exercise Prescription   Comments  Patient has advanced to using the treadmill. MET average ranges from 2.4-2.6. Home exercise reviewed. Will cont to monitor and progress.   Patient has only attended one exercise session. Will cont. to monitor and progress.   Patient is struggling with exercise progression due to disease progression. METs range in the  "low" level. Patient has purchased a treadmill and is going to start exercising at home. Will cont. to monitor and progress as able.   Patient is struggling with exercise progression due to disease progression. METs range in the "low" level. Has treadmill at home and has started his home exercise routine. Will cont. to monitor and progress as able.   Patient is struggling with exercise progression due to disease progression. METs range in the "low" level. Has treadmill at home and has started his home exercise routine. Will cont. to monitor and progress as able.    Expected Outcomes  Through the exercise here at rehab the patient with increase physical capacity, strength, and stamina.  Through the exercise here at rehab the patient with increase physical capacity, strength, and stamina.  Through the exercise here at rehab the patient with increase physical capacity, strength, and stamina.  Through exercise at rehab and at home, patient will increase strength and stamina making ADL's easier to perform. Patient will also have a better understanding of safe exercise and what they are capable to do outside of clinical supervision.  Through exercise at rehab and at home, patient will increase strength and stamina making ADL's easier to perform. Patient will also have a better understanding of safe exercise and what they  are capable to do outside of clinical supervision.   [place] Name 08/14/17 0737             Exercise Goal Re-Evaluation   Exercise Goals Review  Increase Strength and Stamina;Increase Physical Activity;Able to understand and use Dyspnea scale;Able to understand and use rate of perceived exertion (RPE) scale;Knowledge and understanding of Target Heart Rate Range (THRR);Understanding of Exercise Prescription       Comments  Patient is struggling with exercise progression due to disease progression. METs range in the "low" level. Has treadmill at home and has started his home exercise routine. Will  cont. to monitor and progress as able.        Expected Outcomes  Through exercise at rehab and at home, patient will increase strength and stamina making ADL's easier to perform. Patient will also have a better understanding of safe exercise and what they are capable to do outside of clinical supervision.          Discharge Exercise Prescription (Final Exercise Prescription Changes): Exercise Prescription Changes - 08/14/17 1300      Response to Exercise   Blood Pressure (Admit)  159/97    Blood Pressure (Exercise)  130/80    Blood Pressure (Exit)  118/82    Heart Rate (Admit)  68 bpm    Heart Rate (Exercise)  101 bpm    Heart Rate (Exit)  75 bpm    Oxygen Saturation (Admit)  99 %    Oxygen Saturation (Exercise)  81 %    Oxygen Saturation (Exit)  98 %    Rating of Perceived Exertion (Exercise)  11    Perceived Dyspnea (Exercise)  1    Duration  Continue with 45 min of aerobic exercise without signs/symptoms of physical distress.    Intensity  THRR unchanged      Progression   Progression  Continue to progress workloads to maintain intensity without signs/symptoms of physical distress.      Resistance Training   Training Prescription  Yes    Weight  blue bands    Reps  10-15    Time  10 Minutes      Interval Training   Interval Training  No      Oxygen   Oxygen  Continuous    Liters  10-15      Treadmill   MPH  1.9    Grade  0    Minutes  30       Nutrition:  Target Goals: Understanding of nutrition guidelines, daily intake of sodium <1565m, cholesterol <2081m calories 30% from fat and 7% or less from saturated fats, daily to have 5 or more servings of fruits and vegetables.  Biometrics:  Post Biometrics - 03/04/17 1232       Post  Biometrics   Height  _0  (1.676 m)    Weight  210 lb 8.6 oz (95.5 kg)    BMI (Calculated)  34.1    Grip Strength  15 kg    Flexibility  35.5 in       Nutrition Therapy Plan and Nutrition Goals: Nutrition Therapy & Goals -  06/19/17 1323      Nutrition Therapy   Diet  Therapeutic Lifestyle Changes      Personal Nutrition Goals   Nutrition Goal  Wt loss of 1-2 lb/week to a wt loss goal of 6-24 lb at graduation from Pulmonary Rehab    Personal Goal #2  Pt to increase fish consumption to at least 1-2  times/week    Personal Goal #3  Pt to increase consumption of fruits and vegetables from 0-1 cup per day to 2-3 cups per day.    Personal Goal #4  Pt to use nuts/peanut butter as a small snack or protein choice 2 times/week or more.      Intervention Plan   Intervention  Prescribe, educate and counsel regarding individualized specific dietary modifications aiming towards targeted core components such as weight, hypertension, lipid management, diabetes, heart failure and other comorbidities.    Expected Outcomes  Short Term Goal: Understand basic principles of dietary content, such as calories, fat, sodium, cholesterol and nutrients.;Long Term Goal: Adherence to prescribed nutrition plan.       Nutrition Discharge: Rate Your Plate Scores: Nutrition Assessments - 06/19/17 1325      Rate Your Plate Scores   Pre Score  53       Nutrition Goals Re-Evaluation: Nutrition Goals Re-Evaluation    Row Name 03/13/17 0717 05/12/17 1028           Goals   Current Weight  210 lb 8.6 oz (95.5 kg)  210 lb 5.1 oz (95.4 kg)      Nutrition Goal  -  Wt loss of 1-2 lb/week to a wt loss goal of 6-24 lb at graduation from Pulmonary Rehab      Comment  Pt wt is down 2 lb since admission. Wt loss goal wt not achieved. Pt has been limiting food sources of sodium and now always/usually compares and chooses lower sodium food options.   Pt has maintained his wt while in rehab. Wt loss goal wt not achieved. Pt has been limiting food sources of sodium and now usually compares and chooses lower sodium food options.         Personal Goal #2 Re-Evaluation   Personal Goal #2  -  Identify and limit food sources of sodium         Nutrition  Goals Discharge (Final Nutrition Goals Re-Evaluation): Nutrition Goals Re-Evaluation - 05/12/17 1028      Goals   Current Weight  210 lb 5.1 oz (95.4 kg)    Nutrition Goal  Wt loss of 1-2 lb/week to a wt loss goal of 6-24 lb at graduation from Pulmonary Rehab    Comment  Pt has maintained his wt while in rehab. Wt loss goal wt not achieved. Pt has been limiting food sources of sodium and now usually compares and chooses lower sodium food options.       Personal Goal #2 Re-Evaluation   Personal Goal #2  Identify and limit food sources of sodium       Psychosocial: Target Goals: Acknowledge presence or absence of significant depression and/or stress, maximize coping skills, provide positive support system. Participant is able to verbalize types and ability to use techniques and skills needed for reducing stress and depression.  Initial Review & Psychosocial Screening: Initial Psych Review & Screening - 04/25/17 1547      Initial Review   Current issues with  None Identified    Source of Stress Concerns  None Identified      Family Dynamics   Good Support System?  Yes      Barriers   Psychosocial barriers to participate in program  There are no identifiable barriers or psychosocial needs.       Quality of Life Scores:   PHQ-9: Recent Review Flowsheet Data    Depression screen [facility] 2/9 04/25/2017 03/04/2017 11/15/2016   Decreased Interest  0 0 0   Down, Depressed, Hopeless 0 0 0   PHQ - 2 Score 0 0 0     Interpretation of Total Score  Total Score Depression Severity:  1-4 = Minimal depression, 5-9 = Mild depression, 10-14 = Moderate depression, 15-19 = Moderately severe depression, 20-27 = Severe depression   Psychosocial Evaluation and Intervention: Psychosocial Evaluation - 04/25/17 1548      Psychosocial Evaluation & Interventions   Interventions  Encouraged to exercise with the program and follow exercise prescription    Continue Psychosocial Services   No Follow up  required       Psychosocial Re-Evaluation: Psychosocial Re-Evaluation    [place] Name 05/16/17 1134 06/13/17 1132 07/07/17 1037 07/28/17 1742 08/21/17 0826     Psychosocial Re-Evaluation   Current issues with  Current Sleep Concerns  Current Sleep Concerns;Current Depression;History of Depression;Current Stress Concerns  Current Sleep Concerns;Current Depression;History of Depression;Current Stress Concerns  Current Sleep Concerns;Current Depression;History of Depression;Current Stress Concerns  Current Sleep Concerns;Current Depression;History of Depression;Current Stress Concerns   Comments  patient has a habit of waking at the same time every night and migrating to the kitchen and eating sweets. he has had behavior modification therapy in the past with success. he is incoraged to revisit need for therapy. this is keeping patient from getting a full nights rest and causing him to continue to gain weight.  patient has been prescribed antidepressant this week by pulmonologist. Will address stress of illness, depression, and sleep with patient over then next 4-6 weeks as medication takes effect  patient states he is beginning to feel an improvement in his mood, probably as a result of the antidepressant he has been placed on. He also states he is beginning to sleep through the night with only 1 awakening since wearing his CPAP with oxygen.  patient continues to see small improvements in his mood and this is reflected through his social engagements in the class. He is sleeping through the nights more.  patient continues to see small improvements in his mood and this is reflected through his social engagements in the class. He is sleeping through the nights more.   Expected Outcomes  patient will verbalize behavoir modification stratagies to help with getting up and eating with nighttime awakening  patient will verbalize behavoir modification stratagies to help with getting up and eating with nighttime awakening  and will verbalize he has more energy and enjoyment in everyday activities  patient beginning to use behavior modification stratagies that help with his eating sugary snacks with nighttime awakenings.  patient continues to use behavior modification stratagies that help with his eating sugary snacks with nighttime awakenings.  patient continues to use behavior modification stratagies that help with his eating sugary snacks with nighttime awakenings.   Interventions  Encouraged to attend Pulmonary Rehabilitation for the exercise  Encouraged to attend Pulmonary Rehabilitation for the exercise  Encouraged to attend Pulmonary Rehabilitation for the exercise  Encouraged to attend Pulmonary Rehabilitation for the exercise  Encouraged to attend Pulmonary Rehabilitation for the exercise   Continue Psychosocial Services   Follow up required by staff  Follow up required by staff folllow up required by MD  Follow up required by staff  Follow up required by staff  Follow up required by staff   Comments  -  worsening of chronic illness  -  worsening of chronic illness  worsening of chronic illness     Initial Review   Source of Stress Concerns  None  Identified  Chronic Illness  -  Chronic Illness  Chronic Illness      Psychosocial Discharge (Final Psychosocial Re-Evaluation): Psychosocial Re-Evaluation - 08/21/17 0881      Psychosocial Re-Evaluation   Current issues with  Current Sleep Concerns;Current Depression;History of Depression;Current Stress Concerns    Comments  patient continues to see small improvements in his mood and this is reflected through his social engagements in the class. He is sleeping through the nights more.    Expected Outcomes  patient continues to use behavior modification stratagies that help with his eating sugary snacks with nighttime awakenings.    Interventions  Encouraged to attend Pulmonary Rehabilitation for the exercise    Continue Psychosocial Services   Follow up required by  staff    Comments  worsening of chronic illness      Initial Review   Source of Stress Concerns  Chronic Illness       Education: Education Goals: Education classes will be provided on a weekly basis, covering required topics. Participant will state understanding/return demonstration of topics presented.  Learning Barriers/Preferences:   Education Topics: Risk Factor Reduction:  -Group instruction that is supported by a PowerPoint presentation. Instructor discusses the definition of a risk factor, different risk factors for pulmonary disease, and how the heart and lungs work together.     Nutrition for Pulmonary Patient:  -Group instruction provided by PowerPoint slides, verbal discussion, and written materials to support subject matter. The instructor gives an explanation and review of healthy diet recommendations, which includes a discussion on weight management, recommendations for fruit and vegetable consumption, as well as protein, fluid, caffeine, fiber, sodium, sugar, and alcohol. Tips for eating when patients are short of breath are discussed.   PULMONARY REHAB OTHER RESPIRATORY from 07/24/2017 in [place]  Date  02/13/17  Educator  RD  Instruction Review Code  2- meets goals/outcomes      Pursed Lip Breathing:  -Group instruction that is supported by demonstration and informational handouts. Instructor discusses the benefits of pursed lip and diaphragmatic breathing and detailed demonstration on how to preform both.     Oxygen Safety:  -Group instruction provided by PowerPoint, verbal discussion, and written material to support subject matter. There is an overview of "What is Oxygen" and "Why do we need it".  Instructor also reviews how to create a safe environment for oxygen use, the importance of using oxygen as prescribed, and the risks of noncompliance. There is a brief discussion on traveling with oxygen and resources the patient may  utilize.   PULMONARY REHAB OTHER RESPIRATORY from 07/24/2017 in [place]  Date  06/19/17  Educator  Nylan Nevel  Instruction Review Code  2- meets goals/outcomes      Oxygen Equipment:  -Group instruction provided by [employer] Staff utilizing handouts, written materials, and equipment demonstrations.   PULMONARY REHAB OTHER RESPIRATORY from 07/24/2017 in [place]  Date  01/16/17  Educator  George[employer]  Instruction Review Code  2- meets goals/outcomes      Signs and Symptoms:  -Group instruction provided by written material and verbal discussion to support subject matter. Warning signs and symptoms of infection, stroke, and heart attack are reviewed and when to call the physician/911 reinforced. Tips for preventing the spread of infection discussed.   PULMONARY REHAB OTHER RESPIRATORY from 07/24/2017 in [place]  Date  05/15/17  Educator  rn  Instruction Review Code  2- meets goals/outcomes      Advanced Directives:  -Group instruction provided by verbal instruction and written material to support subject matter. Instructor reviews Advanced Directive laws and proper instruction for filling out document.   Pulmonary Video:  -Group video education that reviews the importance of medication and oxygen compliance, exercise, good nutrition, pulmonary hygiene, and pursed lip and diaphragmatic breathing for the pulmonary patient.   PULMONARY REHAB OTHER RESPIRATORY from 07/24/2017 in [place]  Date  05/22/17  Instruction Review Code  2- meets goals/outcomes      Exercise for the Pulmonary Patient:  -Group instruction that is supported by a PowerPoint presentation. Instructor discusses benefits of exercise, core components of exercise, frequency, duration, and intensity of an exercise routine, importance of utilizing pulse oximetry during exercise,  safety while exercising, and options of places to exercise outside of rehab.     PULMONARY REHAB OTHER RESPIRATORY from 07/24/2017 in [place]  Date  05/06/17  Educator  ep  Instruction Review Code  R- Review/reinforce      Pulmonary Medications:  -Verbally interactive group education provided by instructor with focus on inhaled medications and proper administration.   PULMONARY REHAB OTHER RESPIRATORY from 07/24/2017 in [place]  Date  01/09/17  Educator  Pharm  Instruction Review Code  2- meets goals/outcomes      Anatomy and Physiology of the Respiratory System and Intimacy:  -Group instruction provided by PowerPoint, verbal discussion, and written material to support subject matter. Instructor reviews respiratory cycle and anatomical components of the respiratory system and their functions. Instructor also reviews differences in obstructive and restrictive respiratory diseases with examples of each. Intimacy, Sex, and Sexuality differences are reviewed with a discussion on how relationships can change when diagnosed with pulmonary disease. Common sexual concerns are reviewed.   MD DAY -A group question and answer session with a medical doctor that allows participants to ask questions that relate to their pulmonary disease state.   PULMONARY REHAB OTHER RESPIRATORY from 07/24/2017 in [place]  Date  06/03/17  Educator  yacoub  Instruction Review Code  2- meets goals/outcomes      OTHER EDUCATION -Group or individual verbal, written, or video instructions that support the educational goals of the pulmonary rehab program.   PULMONARY REHAB OTHER RESPIRATORY from 07/24/2017 in [place]  Date  07/03/17 [TSVXBLT Eating]  Educator  RD  Instruction Review Code  1- Verbalizes Understanding      Knowledge Questionnaire Score: Knowledge Questionnaire  Score - 02/27/17 1537      Knowledge Questionnaire Score   Post Score  11/13       Core Components/Risk Factors/Patient Goals at Admission: Personal Goals and Risk Factors at Admission - 04/25/17 1546      Core Components/Risk Factors/Patient Goals on Admission   Improve shortness of breath with ADL's  Yes    Intervention  Provide education, individualized exercise plan and daily activity instruction to help decrease symptoms of SOB with activities of daily living.    Expected Outcomes  Short Term: Achieves a reduction of symptoms when performing activities of daily living.    Hypertension  Yes    Intervention  Provide education on lifestyle modifcations including regular physical activity/exercise, weight management, moderate sodium restriction and increased consumption of fresh fruit, vegetables, and low fat dairy, alcohol moderation, and smoking cessation.;Monitor prescription use compliance.    Expected  Outcomes  Short Term: Continued assessment and intervention until BP is < 140/64m HG in hypertensive participants. < 130/860mHG in hypertensive participants with diabetes, heart failure or chronic kidney disease.;Long Term: Maintenance of blood pressure at goal levels.       Core Components/Risk Factors/Patient Goals Review:  Goals and Risk Factor Review    Row Name 02/25/17 1551 05/16/17 1132 06/13/17 1120 07/07/17 1028 07/28/17 1739     Core Components/Risk Factors/Patient Goals Review   Personal Goals Review  Weight Management/Obesity;Improve shortness of breath with ADL's;Develop more efficient breathing techniques such as purse lipped breathing and diaphragmatic breathing and practicing self-pacing with activity.;Hypertension;Stress  Develop more efficient breathing techniques such as purse lipped breathing and diaphragmatic breathing and practicing self-pacing with activity.;Hypertension;Stress;Weight Management/Obesity;Improve shortness of breath with ADL's;Increase knowledge of  respiratory medications and ability to use respiratory devices properly.  Develop more efficient breathing techniques such as purse lipped breathing and diaphragmatic breathing and practicing self-pacing with activity.;Hypertension;Stress;Weight Management/Obesity;Improve shortness of breath with ADL's;Increase knowledge of respiratory medications and ability to use respiratory devices properly.  Develop more efficient breathing techniques such as purse lipped breathing and diaphragmatic breathing and practicing self-pacing with activity.;Hypertension;Stress;Weight Management/Obesity;Improve shortness of breath with ADL's;Increase knowledge of respiratory medications and ability to use respiratory devices properly.  Develop more efficient breathing techniques such as purse lipped breathing and diaphragmatic breathing and practicing self-pacing with activity.;Hypertension;Stress;Weight Management/Obesity;Improve shortness of breath with ADL's;Increase knowledge of respiratory medications and ability to use respiratory devices properly.   Review  his weight remains at baseline. his wife states the patient is more aware of the "unhealthy" things he eats. he utilizes PLB and pacing however he sometimes feels he cant get enough air through his nose. I have worked with patient on taking in larger breaths through his mouth and blowing out through pursed lips. This technique makes him feel more satisfied with air consumption and decreases the anxiety he has when he feels he cant get enough air through his nose. he has learned to pace himself so that he can tolerate workload increases. his hypertension has resolved with medication. 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 he admits to not following through with his discharge home exercise plan and by being sedentary for the last several months patient has become deconditioned. It is too early in  this admission to evaluate progress towards current program and personal goals.  Patient is attempting to be more compliant with activity prescribed at home. He voices feeling "worse" on a daily basis and has seen his pulmonologist this week for a full work-up. His BP remains high at times, his SOB is worsening and his oxygen requirements are increasing. Will have a better idea of treatment plan goals once all test results are reviewed by md.  patient has accepted his need for weekly exercise and is walking on his treadmill as prescribed. He is attempting to cut back on his "unhealthy" eating in order to loose weight. He verbalizes watching his sugar intake as it relates to inflammation. 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 His resting BP prior to exercise ranges from 120s-140s and during exertion 115-150s.  patient making very slow progression towards goals related to disease progression and deconditioning. He is to be commended on his weight loss of 7 lbs since admission. He has done this by limiting simple carbs such as sweet tea and drinking more water. He is  also doing his best to limit nighttime snacking. His HTN has stabalized on current medication regimine and he seems to be less anxious and stressed over the progression of his disease. He continues to use PLB during exercise.   Expected Outcomes  see admission expected outcomes  see admission expected outcomes  see admission expected outcomes  see admission expected outcomes  see admission expected outcomes   Row Name 08/21/17 0822             Core Components/Risk Factors/Patient Goals Review   Personal Goals Review  Develop more efficient breathing techniques such as purse lipped breathing and diaphragmatic breathing and practicing self-pacing with activity.;Hypertension;Stress;Weight Management/Obesity;Improve shortness of breath with ADL's;Increase knowledge of respiratory medications and ability to use respiratory devices  properly.       Review  Patient continues to work as hard as he can at each session in pulmonary rehab. He no longer tolerates workload increases and has had a recent appointment at his pulmonologist where both agreed that his disease has progressed. He recently spoke with the RD about his lack of appitite. Encourged patient to be a physically active as possible and to take as many restbreaks as necessary. It may be time to discuss maintaining his physical abilitilies more than progression towards goals.       Expected Outcomes  see admission expected outcomes          Core Components/Risk Factors/Patient Goals at Discharge (Final Review):  Goals and Risk Factor Review - 08/21/17 0822      Core Components/Risk Factors/Patient Goals Review   Personal Goals Review  Develop more efficient breathing techniques such as purse lipped breathing and diaphragmatic breathing and practicing self-pacing with activity.;Hypertension;Stress;Weight Management/Obesity;Improve shortness of breath with ADL's;Increase knowledge of respiratory medications and ability to use respiratory devices properly.    Review  Patient continues to work as hard as he can at each session in pulmonary rehab. He no longer tolerates workload increases and has had a recent appointment at his pulmonologist where both agreed that his disease has progressed. He recently spoke with the RD about his lack of appitite. Encourged patient to be a physically active as possible and to take as many restbreaks as necessary. It may be time to discuss maintaining his physical abilitilies more than progression towards goals.    Expected Outcomes  see admission expected outcomes       ITP Comments:   Comments: Patient has attended 14 sessions since admission to pulmonary rehab

## 2017-08-28 ENCOUNTER — Encounter (HOSPITAL_COMMUNITY)
Admission: RE | Admit: 2017-08-28 | Discharge: 2017-08-28 | Disposition: A | Discharge status: Home / Self Care | Payer: Medicare Other | Source: Ambulatory Visit | Attending: Pulmonary Disease | Admitting: Pulmonary Disease

## 2017-08-28 VITALS — Wt 200.0 lb

## 2017-08-28 DIAGNOSIS — J849 Interstitial pulmonary disease, unspecified: Secondary | ICD-10-CM

## 2017-08-28 NOTE — Progress Notes (Signed)
Daily Session Note  Patient Details  Name: Jeffrey Cobb MRN: [id number] Date of Birth: 19-Sep-1940 Referring Provider:     Pulmonary Rehab Walk Test from 04/29/2017 in [place]  Referring Provider  Dr. [name]      Encounter Date: 08/28/2017  Check In: Session Check In - 08/28/17 1117      Check-In   Location  [facility]    Staff Present  Rodney Langton, RN;Portia Rollene Rotunda, RN, BSN;Silvester Reierson, MS, ACSM RCEP, Exercise Physiologist;Joan Leonia Reeves, RN, BSN    Supervising physician immediately available to respond to emergencies  [facility] immediately available    Physician(s)  Dr. Eliseo Squires    Medication changes reported      No    Fall or balance concerns reported     No    Tobacco Cessation  No Change    Warm-up and Cool-down  Performed as group-led instruction    Resistance Training Performed  Yes    VAD Patient?  No      Pain Assessment   Currently in Pain?  No/denies    Multiple Pain Sites  No       Capillary Blood Glucose: No results found for this or any previous visit (from the past 24 hour(s)).  Exercise Prescription Changes - 08/28/17 1300      Response to Exercise   Blood Pressure (Admit)  124/80    Blood Pressure (Exit)  122/68    Heart Rate (Admit)  94 bpm    Heart Rate (Exercise)  108 bpm    Heart Rate (Exit)  90 bpm    Oxygen Saturation (Admit)  96 %    Oxygen Saturation (Exercise)  85 %    Oxygen Saturation (Exit)  99 %    Rating of Perceived Exertion (Exercise)  11    Perceived Dyspnea (Exercise)  1    Duration  Continue with 45 min of aerobic exercise without signs/symptoms of physical distress.    Intensity  THRR unchanged      Progression   Progression  Continue to progress workloads to maintain intensity without signs/symptoms of physical distress.      Resistance Training   Training Prescription  Yes    Weight  blue bands    Reps  10-15    Time  10 Minutes      Interval Training   Interval Training  No      Oxygen   Oxygen  Continuous    Liters  10-15      Treadmill   MPH  1.9    Grade  0      NuStep   Level  6    Minutes  17    METs  2.1       Social History   Tobacco Use  Smoking Status Former Smoker  . Packs/day: 3.00  . Years: 17.00  . Pack years: 51.00  . Types: Cigarettes  . Last attempt to quit: 08/26/1977  . Years since quitting: 40.0  Smokeless Tobacco Never Used    Goals Met:  Exercise tolerated well No report of cardiac concerns or symptoms Strength training completed today  Goals Unmet:  Not Applicable  Comments: Service time is from 10:30a to 12:30p    Dr. Rush Farmer is Medical Director for Pulmonary Rehab at [facility].

## 2017-09-02 ENCOUNTER — Encounter (HOSPITAL_COMMUNITY)
Admission: RE | Admit: 2017-09-02 | Discharge: 2017-09-02 | Disposition: A | Discharge status: Home / Self Care | Payer: Medicare Other | Source: Ambulatory Visit | Attending: Pulmonary Disease | Admitting: Pulmonary Disease

## 2017-09-02 VITALS — Wt 207.0 lb

## 2017-09-02 DIAGNOSIS — J849 Interstitial pulmonary disease, unspecified: Secondary | ICD-10-CM

## 2017-09-02 NOTE — Progress Notes (Signed)
Daily Session Note  Patient Details  Name: Jeffrey Cobb MRN: [id number] Date of Birth: 10-Sep-1940 Referring Provider:     Pulmonary Rehab Walk Test from 04/29/2017 in [place]  Referring Provider  Dr. [name]      Encounter Date: 09/02/2017  Check In: Session Check In - 09/02/17 1030      Check-In   Location  [facility]    Staff Present  Rosebud Poles, RN, Luisa Hart, RN, BSN;Alencia Gordon, MS, ACSM RCEP, Exercise Physiologist;Lisa Ysidro Evert, RN    Supervising physician immediately available to respond to emergencies  [facility] immediately available    Physician(s)  Dr. Eliseo Squires    Medication changes reported      No    Fall or balance concerns reported     No    Tobacco Cessation  No Change    Warm-up and Cool-down  Performed as group-led instruction    Resistance Training Performed  Yes    VAD Patient?  No      Pain Assessment   Currently in Pain?  No/denies    Multiple Pain Sites  No       Capillary Blood Glucose: No results found for this or any previous visit (from the past 24 hour(s)).  Exercise Prescription Changes - 09/02/17 1200      Response to Exercise   Blood Pressure (Admit)  124/70    Blood Pressure (Exercise)  130/68    Blood Pressure (Exit)  110/64    Heart Rate (Admit)  89 bpm    Heart Rate (Exercise)  109 bpm    Heart Rate (Exit)  86 bpm    Oxygen Saturation (Admit)  97 %    Oxygen Saturation (Exercise)  90 %    Oxygen Saturation (Exit)  96 %    Rating of Perceived Exertion (Exercise)  13    Perceived Dyspnea (Exercise)  2    Duration  Continue with 45 min of aerobic exercise without signs/symptoms of physical distress.    Intensity  THRR unchanged      Progression   Progression  Continue to progress workloads to maintain intensity without signs/symptoms of physical distress.      Resistance Training   Training Prescription  Yes    Weight  blue bands    Reps  10-15    Time  10  Minutes      Interval Training   Interval Training  No      Oxygen   Oxygen  Continuous    Liters  10-15      Treadmill   MPH  1.8    Grade  0    Minutes  17      NuStep   Level  6    Minutes  17    METs  1.8       Social History   Tobacco Use  Smoking Status Former Smoker  . Packs/day: 3.00  . Years: 17.00  . Pack years: 51.00  . Types: Cigarettes  . Last attempt to quit: 08/26/1977  . Years since quitting: 40.0  Smokeless Tobacco Never Used    Goals Met:  Exercise tolerated well No report of cardiac concerns or symptoms Strength training completed today  Goals Unmet:  Not Applicable  Comments: Service time is from 10:30a to 12:00p    Dr. Rush Farmer is Medical Director for Pulmonary Rehab at [facility].

## 2017-09-04 ENCOUNTER — Encounter (HOSPITAL_COMMUNITY)
Admission: RE | Admit: 2017-09-04 | Discharge: 2017-09-04 | Disposition: A | Discharge status: Home / Self Care | Payer: Medicare Other | Source: Ambulatory Visit | Attending: Pulmonary Disease | Admitting: Pulmonary Disease

## 2017-09-04 VITALS — Wt 204.4 lb

## 2017-09-04 DIAGNOSIS — J849 Interstitial pulmonary disease, unspecified: Secondary | ICD-10-CM | POA: Diagnosis not present

## 2017-09-04 NOTE — Progress Notes (Signed)
Daily Session Note  Patient Details  Name: Jeffrey Cobb MRN: [id number] Date of Birth: Dec 30, 1940 Referring Provider:     Pulmonary Rehab Walk Test from 04/29/2017 in [place]  Referring Provider  Dr. [name]      Encounter Date: 09/04/2017  Check In: Session Check In - 09/04/17 1236      Check-In   Location  [facility]    Staff Present  Rodney Langton, RN;Portia Rollene Rotunda, RN, BSN;Molly diVincenzo, MS, ACSM RCEP, Exercise Physiologist;Joan Leonia Reeves, RN, BSN    Supervising physician immediately available to respond to emergencies  [facility] immediately available    Physician(s)  Dr. Cruzita Lederer    Medication changes reported      No    Fall or balance concerns reported     No    Tobacco Cessation  No Change    Warm-up and Cool-down  Performed as group-led instruction    Resistance Training Performed  Yes    VAD Patient?  No      Pain Assessment   Currently in Pain?  No/denies    Multiple Pain Sites  No       Capillary Blood Glucose: No results found for this or any previous visit (from the past 24 hour(s)).  Exercise Prescription Changes - 09/04/17 1200      Response to Exercise   Blood Pressure (Admit)  130/80    Blood Pressure (Exercise)  130/80    Blood Pressure (Exit)  130/88    Heart Rate (Admit)  96 bpm    Heart Rate (Exercise)  102 bpm    Heart Rate (Exit)  98 bpm    Oxygen Saturation (Admit)  98 %    Oxygen Saturation (Exercise)  91 %    Oxygen Saturation (Exit)  94 %    Rating of Perceived Exertion (Exercise)  11    Perceived Dyspnea (Exercise)  0    Duration  Continue with 45 min of aerobic exercise without signs/symptoms of physical distress.    Intensity  THRR unchanged      Progression   Progression  Continue to progress workloads to maintain intensity without signs/symptoms of physical distress.      Resistance Training   Training Prescription  Yes    Weight  blue bands    Reps  10-15    Time   10 Minutes      Interval Training   Interval Training  No      Oxygen   Oxygen  Continuous    Liters  10-15      Treadmill   MPH  1.8    Grade  0    Minutes  17      NuStep   Level  6    Minutes  17    METs  2       Social History   Tobacco Use  Smoking Status Former Smoker  . Packs/day: 3.00  . Years: 17.00  . Pack years: 51.00  . Types: Cigarettes  . Last attempt to quit: 08/26/1977  . Years since quitting: 40.0  Smokeless Tobacco Never Used    Goals Met:  Exercise tolerated well No report of cardiac concerns or symptoms Strength training completed today  Goals Unmet:  Not Applicable  Comments: Service time is from 1030 to 1230    Dr. Rush Farmer is Medical Director for Pulmonary Rehab at [facility].

## 2017-09-09 ENCOUNTER — Ambulatory Visit (INDEPENDENT_AMBULATORY_CARE_PROVIDER_SITE_OTHER): Payer: Medicare Other | Admitting: Neurology

## 2017-09-09 ENCOUNTER — Encounter: Payer: Self-pay | Admitting: Neurology

## 2017-09-09 VITALS — BP 111/76 | HR 86 | Ht 67.0 in | Wt 207.5 lb

## 2017-09-09 DIAGNOSIS — Z9989 Dependence on other enabling machines and devices: Secondary | ICD-10-CM

## 2017-09-09 DIAGNOSIS — G4736 Sleep related hypoventilation in conditions classified elsewhere: Secondary | ICD-10-CM | POA: Diagnosis not present

## 2017-09-09 DIAGNOSIS — Z9981 Dependence on supplemental oxygen: Secondary | ICD-10-CM | POA: Insufficient documentation

## 2017-09-09 DIAGNOSIS — J439 Emphysema, unspecified: Secondary | ICD-10-CM

## 2017-09-09 DIAGNOSIS — J8410 Pulmonary fibrosis, unspecified: Secondary | ICD-10-CM | POA: Diagnosis not present

## 2017-09-09 DIAGNOSIS — G4733 Obstructive sleep apnea (adult) (pediatric): Secondary | ICD-10-CM | POA: Diagnosis not present

## 2017-09-09 DIAGNOSIS — J449 Chronic obstructive pulmonary disease, unspecified: Secondary | ICD-10-CM

## 2017-09-09 DIAGNOSIS — F19982 Other psychoactive substance use, unspecified with psychoactive substance-induced sleep disorder: Secondary | ICD-10-CM | POA: Diagnosis not present

## 2017-09-09 NOTE — Progress Notes (Signed)
[facility]  PATIENT: Jeffrey Cobb DOB: 1940-09-15   REASON FOR VISIT: Follow-up for CPAP needs in treatment of  obstructive sleep apnea, RLS and oxygen dependence.  HISTORY FROM: Patient, here alone.  Interval history from 09 September 2017, I have the pleasure of seeing [age] ear old  [name] today after a more than 3 years hiatus in the sleep clinic, but he has followed regularly [facility], and remains an active patient.  Dr. Pearlean BrownieSETHI ,  his stroke physician has originally sent him for a split-night polysomnography performed on 10 February 2014.  The patient had complex sleep apnea with an AHI of 40.6, RDI of 48.3 and slept all night and supine.  REM sleep was not entered.  Desaturation index was very high at 70.8/h of sleep, the patient had a total desaturation time under 88% of 1 hour and 40 minutes.  Nadir of oxygen was 80%.  There were no periodic limb movements with the diagnosis was severe sleep apnea, complex sleep apnea and associated with hypoxemia.  The patient was titrated to CPAP in the same night but remained hypoxemic with 194 minutes of desaturation while his apnea index reveals reduced to 0.3/h.  Final pressure was 10 cmH2O was 2 cm EPR and he has remained on this setting ever since.  Initially and nasal mask and large size was given to him the pico mask.  I have also Axis II days download.  Over the last 7 days the patient's headgear to keep his CPAP mask in place had broken and for this reason he is here today.  His compliance was reduced to 67% but if we eliminate the last week his compliance would be 85%.  Average use of time is 5 hours and 42 minutes, with a CPAP setting of 10 cmH2O was 2 cm EPR and a residual AHI of 1.8.  Air leaks seem to be minor. He is using XL sized PICO mask, needs new supplies, he is using in daytime a pulsating oxygen generator at 3 L a minute with nasal cannula, at night he has a stationary continues oxygen concentrator which bleeds  oxygen into the CPAP.  He inquiered about a nasal pillow instead, the dream wear mask, and s assured me that he sleeps on his side all night- the mask wouldn't dislodge .     HISTORY OF PRESENT ILLNESS:[name]. Jeffrey Cobb, 77 year old male returns for followup. He has a history of restless leg syndrome and sensory dysesthesias. He also has a history of obstructive sleep apnea with good compliance of CPAP in the past however due to his chronic cough in October last year which did not respond to antibiotics or prednisone. He has been unable to use his CPAP since that time . He was referred to ENT and now [facility]. On today's visit he also reports continued numbness, tingling/burning and discomfort in his feet, this is been an ongoing problem but it has worsened. EMG nerve conduction after last visit was normal. He has been placed on a trial of Neurontin by his primary care however had side effects of dizziness on the medication.He has a history of spinal stenosis and lumbar spine decompression 2011 by Dr. Gerlene FeeKritzer. He has had 2 epidural injections since last seen with minimal benefit. He denies any falls however he does feel off balance at times. He has intermittent back pain since his surgery. His restless legs are under good control with his Mirapex. He returns  for reevaluation  MRI of the spine (lumbar) that he had done in April of 2009 showed moderate to severe spinal stenosis. He had lumbar spine decompression and fusion by Dr. Gerlene Fee in 2011, did very well. He also has a history of right and left knee replacements and a shoulder replacement. He is currently taking Mirapex for his restless legs tolerating the medication without drowsiness or any type of compulsive behaviors. iron profile was normal.   Update 07/31/2015 PS : Patient is seen today for follow-up after her recent hospital admission for TIA on 05/31/15. He presented following an episode of word finding difficulty  as well as slurred speech. He was last known well at 4:30 PM on 05/31/2015. Deficits lasted about 45 minutes then resolved. He has no previous history of stroke nor TIA. He was seen initially at [facility] and subsequently transferred to [facility] for further management. He had not been on antiplatelets therapy. CT scan of his head showed no acute intracranial abnormality. NIH stroke score on admission evaluation was 0. Patient was not administered TPA secondary to Deficits resolved rapidly. He was admitted for further evaluation and treatment. MRI scan of the brain showed no acute infarct only mild age-related changes of cerebral atrophy and chronic small vessel ischemia. MRA of the brain showed no large vessel stenosis. Mild distal atheromatous changes were noted in MCA and PCA branches. Carotid ultrasound showed no significant Stenosis. Transthoracic echo showed normal ejection fraction. NIH stroke scale was 0. ABCD 2 score was 4. Patient qualified for and signed consent and participate in the PARFAIT TIA study. LDL cholesterol was elevated at 103 and he was started on Lipitor. He states his done well since discharge and has not had any recurrent stroke or TIA symptoms. He was seen by Dr. Roda Shutters on 06/28/15 for 30 days study visit and study medication was discontinued. He is currently on aspirin 81 mg which is tolerating well without bleeding or bruising. He is also not having any side effects on Lipitor 40 mg daily which is taking everyday. He has not had any new complaints.  UPDATE 10/24/2015 [name], 78 year old returns for followup. He was last seen in the office by Dr. Pearlean Brownie 07/31/2015. He has history of TIA with an episode of word finding and slurred speech on 05/31/2015. He was started on baby aspirin. MRI without acute infarct. MRA no large vessel stenosis. Carotid ultrasound negative. LDL cholesterol was elevated and he was started on Lipitor. His restless legs are in good control with Mirapex. He is also on  amitriptyline at bedtime. He remains on Ambien for insomnia. He does not use his CPAP for his obstructive sleep apnea although he says he may start back.He has not had further stroke or TIA symptoms. He returns for reevaluation UPDATE 06/15/2017[name], 77 year old male returns for follow-up. History of TIA events with word finding difficulty and slurred speech which occurred in October 2016. He has not had further stroke or TIA symptoms and is currently on aspirin and Lipitor for secondary stroke prevention. He also has a history of restless legs which are in good control with Mirapex. His amitriptyline has been tapered by his pulmonologist. He is currently not using CPAP due to shortness of breath. He is due for a pulmonary function next week.He continues to complain of  numbness, tingling/burning and discomfort in his feet, this is been an ongoing problem , EMG nerve conduction in the past x 2  was normal.. He returns for reevaluation UPDATE  12/15/2017[name], 77 year old male returns for follow-up. He has a history of TIA event with word finding difficulty and slurred speech October 2016. He is currently on aspirin without bruising or bleeding. He has not had further stroke or TIA symptoms. In addition he is on Lipitor and denies myalgias. He is due to get labs done at primary care next week. He also has a long history of restless legs in good control with Mirapex. He has obstructive sleep apnea but does not use CPAP due to shortness of breath. He also has a history of chronic obstructive pulmonary disease and is due to have a pulmonary function test in January. Next Carotid Doppler's scheduled  October 11 2016 at  cardiovascular imaging. He does not exercise due to his shortness of breath. He returns for  reevaluation UPDATE 12/18/2018[name], 77 year old male returns for yearly follow-up with history of TIA in October 2016 he remains on aspirin without signs of bleeding and  minimal bruising.  He is not had further stroke or TIA symptoms.  He is on Lipitor for hyperlipidemia without myalgias.  He has a long history of restless leg syndrome in good control with Mirapex.  He needs refills.  He has obstructive sleep apnea and uses his CPAP with additional oxygen.  His carotid Dopplers are followed by cardiology, Dr. Kendrick Fries last done 10/23/2016.  He also has a history of idiopathic pulmonary fibrosis and gets short of breath with any exertion.  He is on continuous oxygen.  He returns for reevaluation  REVIEW OF SYSTEMS: Full 14 system review of systems performed and notable only for those listed, all others are neg:   Jeffrey Cobb endorsed the Epworth sleepiness scale at 9 points, 3 points each for sitting and reading, for watching television and for lying down to rest in the afternoon.  He endorsed the fatigue severity scale at 37 points.  He was apparently not given a depression score.   Respiratory: Shortness of breath ,cough. 02 dependent 24/ 7 - at o2 at night into CPAP.  ,  Hematology/Lymphatic: Easy bruising  Endocrine: Intolerance to cold Psychiatric: Depression Sleep : Restless legs, obstructive sleep apnea    ALLERGIES: Allergies  Allergen Reactions  . Gabapentin     Dizziness   . Sulfonamide Derivatives Rash  . Trazodone And Nefazodone Rash and Other (See Comments)    Hallucinations     HOME MEDICATIONS: Outpatient Medications Prior to Visit  Medication Sig Dispense Refill  . albuterol (PROVENTIL) (2.5 MG/3ML) 0.083% nebulizer solution Take 3 mLs (2.5 mg total) by nebulization every 6 (six) hours as needed for wheezing or shortness of breath. 360 mL 11  . aspirin 81 MG tablet Take 81 mg by mouth daily.    Marland Kitchen atorvastatin (LIPITOR) 40 MG tablet Take 1 tablet (40 mg total) by mouth daily at 6 PM. 30 tablet 0  . chlorpheniramine-HYDROcodone (TUSSIONEX PENNKINETIC ER) 10-8 MG/5ML SUER Take 5 mLs by mouth every 12 (twelve) hours as needed for cough. 140  mL 0  . cyanocobalamin 500 MCG tablet Take 1,000 mcg by mouth daily.    . DULoxetine (CYMBALTA) 20 MG capsule Take 2 capsules (40 mg total) by mouth daily. 60 capsule 4  . Nintedanib (OFEV) 150 MG CAPS Take 150 mg by mouth 2 (two) times daily. 60 capsule   . oxymetazoline (AFRIN) 0.05 % nasal spray Place 1 spray into both nostrils as needed for congestion.    . pramipexole (MIRAPEX) 0.5 MG tablet TAKE 1 TABLET BY  MOUTH EVERY MORNING AND TAKE 1 TABLET EVERY EVENING 180 tablet 3  . PROAIR HFA 108 (90 BASE) MCG/ACT inhaler Inhale 2 puffs into the lungs every 6 (six) hours as needed.    Marland Kitchen QUEtiapine (SEROQUEL) 100 MG tablet     . sodium chloride HYPERTONIC 3 % nebulizer solution Take by nebulization 2 (two) times daily as needed for other. 224 mL 12  . triamcinolone cream (KENALOG) 0.1 % APPLY 1 APPLICATION ON THE SKIN TWICE A DAY AS NEEDED  2  . amLODipine (NORVASC) 2.5 MG tablet Take 2.5 mg by mouth daily.    . predniSONE (DELTASONE) 20 MG tablet Take 1 tablet (20 mg total) by mouth daily with breakfast. 5 tablet 0   No facility-administered medications prior to visit.     PAST MEDICAL HISTORY: Past Medical History:  Diagnosis Date  . Acute sinusitis, unspecified   . Arthritis   . 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 GERD (gastroesophageal reflux disease)   . Left anterior fascicular block    hx of on ekg  . Mixed hyperlipidemia   . Obstructive chronic bronchitis with exacerbation (HCC)   . Occlusion and stenosis of carotid artery without mention of cerebral infarction    a. 05/2013 Carotid U/S: 1-39% bilat stenosis.  . Other emphysema (HCC)   . 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 Sleep apnea    does not use cpap due to cough  . Stroke [facility]) 05/2015    PAST SURGICAL HISTORY: Past Surgical History:  Procedure Laterality Date  . APPENDECTOMY  1956  . BRAVO PH STUDY N/A 05/02/2016   Procedure: BRAVO PH  STUDY;  Surgeon: Rachael Fee, MD;  Location: [facility];  Service: Endoscopy;  Laterality: N/A;  . ESOPHAGOGASTRODUODENOSCOPY (EGD) WITH PROPOFOL N/A 05/02/2016   Procedure: ESOPHAGOGASTRODUODENOSCOPY (EGD) WITH PROPOFOL;  Surgeon: Rachael Fee, MD;  Location: [facility];  Service: Endoscopy;  Laterality: N/A;  . EYE SURGERY Bilateral    cataract removal  . JOINT REPLACEMENT Right    shoulder replacement  . LEFT KNEE REPLACEMENT  2001  . RIGHT ANKLE RECONSTRUCTION    . RIGHT KNEE REPLACEMENT  2005  . RUPTURED DISK  2000  . SPINE SURGERY  2000/2011  . torn retina Left   . TOTAL SHOULDER ARTHROPLASTY Left 06/30/2013   Procedure: TOTAL SHOULDER ARTHROPLASTY;  Surgeon: Loreta Ave, MD;  [facility]: [facility];  Service: Orthopedics;  Laterality: Left;    FAMILY HISTORY: Family History  Problem Relation Age of Onset  . Emphysema Mother   . Heart disease Mother   . Alzheimer's disease Mother   . Heart disease Father   . Stroke Paternal Grandfather   . Allergies Unknown        FH: FATHER,2 SISTER,1 BROTHER, SON  DAUGHTER  . Cancer Sister     SOCIAL HISTORY: Social History   Socioeconomic History  . Marital status: Married    Spouse name: Myriam Jacobson   . Number of children: 2  . Years of education: Assoc   . Highest education level: Not on file  Social Needs  . Financial resource strain: Not on file  . Food insecurity - worry: Not on file  . Food insecurity - inability: Not on file  . Transportation needs - medical: Not on file  . Transportation needs - non-medical: Not on file  Occupational History  . Occupation: Retired    Associate Professor: RETIRED  Tobacco Use  . Smoking status: Former Smoker    Packs/day: 3.00    Years: 17.00    Pack years: 51.00    Types: Cigarettes    Last attempt to quit: 08/26/1977    Years since quitting: 40.0  . Smokeless tobacco: Never Used  Substance and Sexual Activity  . Alcohol use: Yes    Comment: rarely  . Drug use: No  . Sexual activity:  Not on file  Other Topics Concern  . Not on file  Social History Narrative   Patient is married Myriam Jacobson) and lives at home with his wife.   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    Patient is left handed   Patient drinks 1-2 cups of coffee in the morning and 3-4 cups of tea daily.                  PHYSICAL EXAM  Vitals:   09/09/17 1003  BP: 111/76  Pulse: 86  Weight: 207 lb 8 oz (94.1 kg)  Height: 5\' 7"  (1.702 m)   Body mass index is 32.5 kg/m. Generalized: Well developed, Mildly obese male in no acute distress  Head: normocephalic and atraumatic,.  Neck: Supple, no carotid bruits  Cardiac: Regular rate rhythm, no murmur  Musculoskeletal: Arthritic changes in hands  Neurological examination   Mentation: Alert oriented to time, place, history -Follows all commands speech and language fluent.  Cranial nerve ;  Taste and smell are reduced, taste is bland, no olfactory hallucinations or triggers.  Pupils were equally round,  Status post cataract surgery bilaterally-   visual field were full on confrontational test.  Facial sensation and strength were intact -  hearing was intact to finger rubbing bilaterally. Uvula and tongue midline, the ROM for  head turning and shoulder shrug were normal and symmetric.Tongue protrusion into cheek strength was normal. Motor: normal bulk and tone, full strength in the BUE- fine finger movements normal, no pronator drift.  Coordination: finger-nose-finger,without tremor or dysmetria  DIAGNOSTIC DATA (LABS, IMAGING, TESTING) - I reviewed patient records, labs, notes, testing and imaging myself where available.      Component Value Date/Time   NA 133 (L) 06/11/2017 1115   K 4.0 06/11/2017 1115   CL 98 06/11/2017 1115   CO2 26 06/11/2017 1115   GLUCOSE 106 (H) 06/11/2017 1115   BUN 12 06/11/2017 1115   CREATININE 1.02 06/11/2017 1115   CREATININE 1.13 10/06/2015 0857   CALCIUM 9.2  06/11/2017 1115   PROT 7.6 08/13/2017 1455   ALBUMIN 3.8 08/13/2017 1455   AST 19 08/13/2017 1455   ALT 13 08/13/2017 1455   ALKPHOS 73 08/13/2017 1455   BILITOT 1.0 08/13/2017 1455   GFRNONAA >60 06/01/2015 2038   GFRAA >60 06/01/2015 2038    ASSESSMENT AND PLAN 77 y.o. year old male has a  medical diagnosis of restless leg syndrome ,Persistent disorder of initiating or maintaining sleep; Sleep apnea- OSA on CPAP and on O2 24/7 . He has followed with [facility] stroke specialist Dr. Pearlean Brownie and Nurse Practitioner Darrol Angel for all his disorders but now needs a new CPAPsupplies, needed to be see by MD for order and new work up- he is not due for a new machine - his being 57.77 years old. He can get new supplies , has been compliant before the headgear broke. He needs to be fitted for a nasal pillow. CPAP through [facility], Oxygen through [facility].  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 No further stroke or TIA symptoms and idiopathic pulmonary fibrosis  Keep a healthy diet, hydrate well, and exercise as tolerated Continue CPAP for obstructive sleep apnea with o2 being bled in at 3 liters. Continue Mirapex at current dose for restless legs, was refilled in 07-2017 by CM. Follow-up with Dr. Pearlean Brownie in 6 month. Follow with NP for sleep clinic yearly - download and compliance.  New machine will be issued  July 2020.  Melvyn Novas, MD   [facility] [address] [place], [place] [address] [phone]

## 2017-09-16 ENCOUNTER — Encounter (HOSPITAL_COMMUNITY)
Admission: RE | Admit: 2017-09-16 | Discharge: 2017-09-16 | Disposition: A | Discharge status: Home / Self Care | Payer: Medicare Other | Source: Ambulatory Visit | Attending: Family Medicine | Admitting: Family Medicine

## 2017-09-16 VITALS — Wt 205.0 lb

## 2017-09-16 DIAGNOSIS — J849 Interstitial pulmonary disease, unspecified: Secondary | ICD-10-CM

## 2017-09-16 NOTE — Progress Notes (Signed)
Pulmonary Individual Treatment Plan  Patient Details  Name: Jeffrey Cobb MRN: [id number] Date of Birth: [birth year] Referring Provider:     Pulmonary Rehab Walk Test from 04/29/2017 in [place]  Referring Provider  Dr. [name]      Initial Encounter Date:    Pulmonary Rehab Walk Test from 04/29/2017 in [place]  Date  04/29/17  Referring Provider  Dr. [name]      Visit Diagnosis: ILD (interstitial lung disease) ([place])  Patient's Home Medications on Admission:   Current Outpatient Medications:  .  albuterol (PROVENTIL) (2.5 MG/3ML) 0.083% nebulizer solution, Take 3 mLs (2.5 mg total) by nebulization every 6 (six) hours as needed for wheezing or shortness of breath., Disp: 360 mL, Rfl: 11 .  aspirin 81 MG tablet, Take 81 mg by mouth daily., Disp: , Rfl:  .  atorvastatin (LIPITOR) 40 MG tablet, Take 1 tablet (40 mg total) by mouth daily at 6 PM., Disp: 30 tablet, Rfl: 0 .  chlorpheniramine-HYDROcodone (TUSSIONEX PENNKINETIC ER) 10-8 MG/5ML SUER, Take 5 mLs by mouth every 12 (twelve) hours as needed for cough., Disp: 140 mL, Rfl: 0 .  cyanocobalamin 500 MCG tablet, Take 1,000 mcg by mouth daily., Disp: , Rfl:  .  DULoxetine (CYMBALTA) 20 MG capsule, Take 2 capsules (40 mg total) by mouth daily., Disp: 60 capsule, Rfl: 4 .  Nintedanib (OFEV) 150 MG CAPS, Take 150 mg by mouth 2 (two) times daily., Disp: 60 capsule, Rfl:  .  oxymetazoline (AFRIN) 0.05 % nasal spray, Place 1 spray into both nostrils as needed for congestion., Disp: , Rfl:  .  pramipexole (MIRAPEX) 0.5 MG tablet, TAKE 1 TABLET BY MOUTH EVERY MORNING AND TAKE 1 TABLET EVERY EVENING, Disp: 180 tablet, Rfl: 3 .  PROAIR HFA 108 (90 BASE) MCG/ACT inhaler, Inhale 2 puffs into the lungs every 6 (six) hours as needed., Disp: , Rfl:  .  QUEtiapine (SEROQUEL) 100 MG tablet, , Disp: , Rfl:  .  sodium chloride HYPERTONIC 3 % nebulizer solution, Take by nebulization 2  (two) times daily as needed for other., Disp: 224 mL, Rfl: 12 .  triamcinolone cream (KENALOG) 0.1 %, APPLY 1 APPLICATION ON THE SKIN TWICE A DAY AS NEEDED, Disp: , Rfl: 2  Past Medical History: Past Medical History:  Diagnosis Date  . Acute sinusitis, unspecified   . Arthritis   . 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 GERD (gastroesophageal reflux disease)   . Left anterior fascicular block    hx of on ekg  . Mixed hyperlipidemia   . Obstructive chronic bronchitis with exacerbation ([place])   . Occlusion and stenosis of carotid artery without mention of cerebral infarction    a. 05/2013 Carotid U/S: 1-39% bilat stenosis.  . Other emphysema ([place])   . 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 Sleep apnea    does not use cpap due to cough  . Stroke [facility]) 05/2015    Tobacco Use: Social History   Tobacco Use  Smoking Status Former Smoker  . Packs/day: 3.00  . Years: 17.00  . Pack years: 51.00  . Types: Cigarettes  . Last attempt to quit: 08/26/1977  . Years since quitting: 40.0  Smokeless Tobacco Never Used    Labs: Recent Chemical engineer    Labs for ITP Cardiac and Pulmonary Rehab Latest Ref Rng & Units 10/27/2007 11/01/2008 01/17/2011 06/01/2015  Cholestrol 0 - 200 mg/dL 175 169 165 170   LDLCALC 0 - 99 mg/dL 108(H) 105(H) 90 103(H)   HDL >40 mg/dL 57.8 52.5 67.40 58   Trlycerides <150 mg/dL 44 59 40.0 46   Hemoglobin A1c 4.8 - 5.6 % - - - 5.7(H)      Capillary Blood Glucose: No results found for: GLUCAP   Pulmonary Assessment Scores:   Pulmonary Function Assessment: Pulmonary Function Assessment - 04/25/17 1543      Breath   Bilateral Breath Sounds  -- clear upper fine crackles lower    Shortness of Breath  Yes;Limiting activity       Exercise Target Goals:    Exercise Program Goal: Individual exercise prescription set using results from initial 6 min walk test and THRR while  considering  patient's activity barriers and safety.    Exercise Prescription Goal: Initial exercise prescription builds to 30-45 minutes a day of aerobic activity, 2-3 days per week.  Home exercise guidelines will be given to patient during program as part of exercise prescription that the participant will acknowledge.  Activity Barriers & Risk Stratification: Activity Barriers & Cardiac Risk Stratification - 04/25/17 1512      Activity Barriers & Cardiac Risk Stratification   Activity Barriers  Joint Problems;Arthritis;Back Problems;Deconditioning;Shortness of Breath;Left Knee Replacement;Right Knee Replacement;Neck/Spine Problems       6 Minute Walk: 6 Minute Walk    Row Name 04/29/17 1619         6 Minute Walk   Phase  Discharge     Distance  1300 feet     Walk Time  6 minutes     # of Rest Breaks  0     MPH  2.46     METS  2.84     RPE  11     Perceived Dyspnea   1     Symptoms  Yes (comment)     Comments  USED WHEELCHAIR     Resting HR  101 bpm     Resting BP  150/92     Resting Oxygen Saturation   95 %     Exercise Oxygen Saturation  during 6 min walk  87 %     Max Ex. HR  118 bpm     Max Ex. BP  155/92       Interval HR   1 Minute HR  101     2 Minute HR  104     3 Minute HR  118     5 Minute HR  115     6 Minute HR  112     2 Minute Post HR  111     Interval Heart Rate?  Yes       Interval Oxygen   Interval Oxygen?  Yes     Baseline Oxygen Saturation %  95 %     1 Minute Oxygen Saturation %  93 %     1 Minute Liters of Oxygen  6 L     2 Minute Oxygen Saturation %  87 %     2 Minute Liters of Oxygen  6 L     3 Minute Oxygen Saturation %  86 %     3 Minute Liters of Oxygen  8 L     4 Minute Oxygen Saturation %  87 %     4 Minute Liters of Oxygen  8 L     5 Minute Oxygen Saturation %  87 %  5 Minute Liters of Oxygen  8 L     6 Minute Oxygen Saturation %  87 %     6 Minute Liters of Oxygen  8 L     2 Minute Post Oxygen Saturation %  90 %     2  Minute Post Liters of Oxygen  8 L        Oxygen Initial Assessment: Oxygen Initial Assessment - 04/29/17 1629      Initial 6 min Walk   Oxygen Used  Continuous;E-Tanks    Liters per minute  8      Program Oxygen Prescription   Program Oxygen Prescription  Continuous;E-Tanks    Liters per minute  8       Oxygen Re-Evaluation: Oxygen Re-Evaluation    Row Name 05/16/17 1127 06/13/17 1115 07/07/17 1023 07/08/17 1613 07/28/17 1738     Program Oxygen Prescription   Program Oxygen Prescription  Continuous;E-Tanks  Continuous;E-Tanks  Continuous;E-Tanks  -  Continuous;E-Tanks   Liters per minute  _0 -  10   Comments  he has had to increase to 10 lpm while walking on the treadmill to maintain oxygen saturations greater than 88  -  has progressed to needing 10 lpm with all exercises  had to progress to 15 lpm with treadmill walking at most recent exercise session for desaturation to 86% on 10 liters  had to progress to 15 lpm with treadmill walking at most recent exercise session for desaturation to 86% on 10 liters     Home Oxygen   Home Oxygen Device  Home Concentrator;E-Tanks;Portable Concentrator  Home Concentrator;E-Tanks;Portable Concentrator  Home Concentrator;E-Tanks;Portable Concentrator  -  Home Concentrator;E-Tanks;Portable Concentrator   Sleep Oxygen Prescription  CPAP currently working physician to identify possible need for overnight oximetry to determine need for nighttime oxygen bleedin to cpap  CPAP  CPAP  -  CPAP   Liters per minute  -  -  4  -  4   Home Exercise Oxygen Prescription  Continuous working to get new home concentrator that will accomodate 10lpm continuous flow  Continuous order placed this week for DME to replace patients home concentrator with one that will accomodate 10lpm  Continuous  -  Continuous   Liters per minute  10 while walking on his treadmill  10  10  -  10   Home at Rest Exercise Oxygen Prescription  None  Continuous  Continuous  -   Continuous   Liters per minute  -  2  4  -  4   Compliance with Home Oxygen Use  Yes  Yes  Yes  -  Yes     Goals/Expected Outcomes   Short Term Goals  To learn and exhibit compliance with exercise, home and travel O2 prescription;To learn and understand importance of monitoring SPO2 with pulse oximeter and demonstrate accurate use of the pulse oximeter.;To learn and understand importance of maintaining oxygen saturations>88%;To learn and demonstrate proper pursed lip breathing techniques or other breathing techniques.  To learn and exhibit compliance with exercise, home and travel O2 prescription;To learn and understand importance of monitoring SPO2 with pulse oximeter and demonstrate accurate use of the pulse oximeter.;To learn and understand importance of maintaining oxygen saturations>88%;To learn and demonstrate proper pursed lip breathing techniques or other breathing techniques.  To learn and exhibit compliance with exercise, home and travel O2 prescription;To learn and understand importance of monitoring SPO2 with pulse oximeter and demonstrate accurate use of the pulse  oximeter.;To learn and understand importance of maintaining oxygen saturations>88%;To learn and demonstrate proper pursed lip breathing techniques or other breathing techniques.  -  To learn and exhibit compliance with exercise, home and travel O2 prescription;To learn and understand importance of monitoring SPO2 with pulse oximeter and demonstrate accurate use of the pulse oximeter.;To learn and understand importance of maintaining oxygen saturations>88%;To learn and demonstrate proper pursed lip breathing techniques or other breathing techniques.   Long  Term Goals  Exhibits compliance with exercise, home and travel O2 prescription;Maintenance of O2 saturations>88%;Compliance with respiratory medication;Verbalizes importance of monitoring SPO2 with pulse oximeter and return demonstration;Exhibits proper breathing techniques, such  as pursed lip breathing or other method taught during program session  Exhibits compliance with exercise, home and travel O2 prescription;Maintenance of O2 saturations>88%;Compliance with respiratory medication;Verbalizes importance of monitoring SPO2 with pulse oximeter and return demonstration;Exhibits proper breathing techniques, such as pursed lip breathing or other method taught during program session  Exhibits compliance with exercise, home and travel O2 prescription;Maintenance of O2 saturations>88%;Compliance with respiratory medication;Verbalizes importance of monitoring SPO2 with pulse oximeter and return demonstration;Exhibits proper breathing techniques, such as pursed lip breathing or other method taught during program session  -  Exhibits compliance with exercise, home and travel O2 prescription;Maintenance of O2 saturations>88%;Compliance with respiratory medication;Verbalizes importance of monitoring SPO2 with pulse oximeter and return demonstration;Exhibits proper breathing techniques, such as pursed lip breathing or other method taught during program session   Comments  patient verbalizes compliance with home o2 however barriers to correct use have been identified. Patient does not have adequate oxygen systems at home to maintain oxygen during sleep and with exercise  barriers have been addressed this week by pulmonologist and orders are in place for new high flow home concentrator and o2 bleed in with CPAP  patient has recieved new high flow home concentrator and oxygen bleedin for his CPAP  -  patient has adequate home oxygen to maintain healthy active social lifestyle   Goals/Expected Outcomes  patient will have adequate home oxygen delivery systems to accomodate life needs.  patient will have adequate home oxygen delivery systems to accomodate life needs.  -  -  Goal met   [place] Name 08/21/17 6962 09/15/17 1559           Program Oxygen Prescription   Program Oxygen Prescription   Continuous;E-Tanks  Continuous;E-Tanks      Liters per minute  10  15      Comments  had to progress to 15 lpm with treadmill walking at most recent exercise session for desaturation to 86% on 10 liters  had to progress to 15 lpm with treadmill walking at most recent exercise session for desaturation to 86% on 10 liters        Home Oxygen   Home Oxygen Device  Home Concentrator;E-Tanks;Portable Concentrator  Home Concentrator;E-Tanks;Portable Concentrator      Sleep Oxygen Prescription  CPAP  CPAP      Liters per minute  4  4      Home Exercise Oxygen Prescription  -  Continuous      Liters per minute  10  10 home concentrator only goes to 10lpm-patient requires 15      Home at Rest Exercise Oxygen Prescription  Continuous  Continuous      Liters per minute  4  4      Compliance with Home Oxygen Use  Yes  Yes        Goals/Expected Outcomes   Short Term  Goals  To learn and exhibit compliance with exercise, home and travel O2 prescription;To learn and understand importance of monitoring SPO2 with pulse oximeter and demonstrate accurate use of the pulse oximeter.;To learn and understand importance of maintaining oxygen saturations>88%;To learn and demonstrate proper pursed lip breathing techniques or other breathing techniques.  To learn and exhibit compliance with exercise, home and travel O2 prescription;To learn and understand importance of monitoring SPO2 with pulse oximeter and demonstrate accurate use of the pulse oximeter.;To learn and understand importance of maintaining oxygen saturations>88%;To learn and demonstrate proper pursed lip breathing techniques or other breathing techniques.      Long  Term Goals  Exhibits compliance with exercise, home and travel O2 prescription;Maintenance of O2 saturations>88%;Compliance with respiratory medication;Verbalizes importance of monitoring SPO2 with pulse oximeter and return demonstration;Exhibits proper breathing techniques, such as pursed lip  breathing or other method taught during program session  Exhibits compliance with exercise, home and travel O2 prescription;Maintenance of O2 saturations>88%;Compliance with respiratory medication;Verbalizes importance of monitoring SPO2 with pulse oximeter and return demonstration;Exhibits proper breathing techniques, such as pursed lip breathing or other method taught during program session      Comments  patient has adequate home oxygen to maintain healthy active social lifestyle  -      Goals/Expected Outcomes  Goal met  Goal met         Oxygen Discharge (Final Oxygen Re-Evaluation): Oxygen Re-Evaluation - 09/15/17 1559      Program Oxygen Prescription   Program Oxygen Prescription  Continuous;E-Tanks    Liters per minute  15    Comments  had to progress to 15 lpm with treadmill walking at most recent exercise session for desaturation to 86% on 10 liters      Home Oxygen   Home Oxygen Device  Home Concentrator;E-Tanks;Portable Concentrator    Sleep Oxygen Prescription  CPAP    Liters per minute  4    Home Exercise Oxygen Prescription  Continuous    Liters per minute  10 home concentrator only goes to 10lpm-patient requires 15    Home at Rest Exercise Oxygen Prescription  Continuous    Liters per minute  4    Compliance with Home Oxygen Use  Yes      Goals/Expected Outcomes   Short Term Goals  To learn and exhibit compliance with exercise, home and travel O2 prescription;To learn and understand importance of monitoring SPO2 with pulse oximeter and demonstrate accurate use of the pulse oximeter.;To learn and understand importance of maintaining oxygen saturations>88%;To learn and demonstrate proper pursed lip breathing techniques or other breathing techniques.    Long  Term Goals  Exhibits compliance with exercise, home and travel O2 prescription;Maintenance of O2 saturations>88%;Compliance with respiratory medication;Verbalizes importance of monitoring SPO2 with pulse oximeter and  return demonstration;Exhibits proper breathing techniques, such as pursed lip breathing or other method taught during program session    Goals/Expected Outcomes  Goal met       Initial Exercise Prescription: Initial Exercise Prescription - 04/29/17 1600      Date of Initial Exercise RX and Referring Provider   Date  04/29/17    Referring Provider  Dr. [name]      Oxygen   Oxygen  Continuous    Liters  8      Treadmill   MPH  1.8    Grade  0    Minutes  17      Bike   Level  1    Minutes  17  NuStep   Level  5    Minutes  17    METs  2      Prescription Details   Frequency (times per week)  2    Duration  Progress to 45 minutes of aerobic exercise without signs/symptoms of physical distress      Intensity   THRR 40-80% of Max Heartrate  58-115    Ratings of Perceived Exertion  11-13    Perceived Dyspnea  0-4      Progression   Progression  Continue progressive overload as per policy without signs/symptoms or physical distress.      Resistance Training   Training Prescription  Yes    Weight  Blue bands    Reps  10-15       Perform Capillary Blood Glucose checks as needed.  Exercise Prescription Changes: Exercise Prescription Changes    Row Name 05/06/17 1200 05/13/17 1600 05/15/17 1200 05/20/17 1229 05/22/17 1200     Response to Exercise   Blood Pressure (Admit)  122/80  130/74  118/68  120/74  120/70   Blood Pressure (Exercise)  140/90  150/72  142/82  132/88  156/82   Blood Pressure (Exit)  118/72  110/64  118/60  104/72  114/70   Heart Rate (Admit)  84 bpm  78 bpm  80 bpm  81 bpm  89 bpm   Heart Rate (Exercise)  99 bpm  96 bpm  88 bpm  99 bpm  104 bpm   Heart Rate (Exit)  80 bpm  87 bpm  78 bpm  83 bpm  81 bpm   Oxygen Saturation (Admit)  93 %  92 %  92 %  90 %  91 %   Oxygen Saturation (Exercise)  86 %  86 %  88 %  86 %  87 %   Oxygen Saturation (Exit)  95 %  91 %  99 %  99 %  98 %   Rating of Perceived Exertion (Exercise)  _0 Perceived Dyspnea (Exercise)  0  2  0  1  1   Duration  Continue with 45 min of aerobic exercise without signs/symptoms of physical distress.  Continue with 45 min of aerobic exercise without signs/symptoms of physical distress.  Continue with 45 min of aerobic exercise without signs/symptoms of physical distress.  Continue with 45 min of aerobic exercise without signs/symptoms of physical distress.  Continue with 45 min of aerobic exercise without signs/symptoms of physical distress.   Intensity  THRR unchanged  THRR unchanged  THRR unchanged  THRR unchanged  THRR unchanged     Progression   Progression  Continue to progress workloads to maintain intensity without signs/symptoms of physical distress.  Continue to progress workloads to maintain intensity without signs/symptoms of physical distress.  Continue to progress workloads to maintain intensity without signs/symptoms of physical distress.  Continue to progress workloads to maintain intensity without signs/symptoms of physical distress.  Continue to progress workloads to maintain intensity without signs/symptoms of physical distress.     Resistance Training   Training Prescription  Yes  Yes  Yes  Yes  Yes   Weight  blue bands  blue bands  blue bands  blue bands  blue bands   Reps  10-15  10-15  10-15  10-15  10-15   Time  10 Minutes  10 Minutes  10 Minutes  10 Minutes  10 Minutes  Interval Training   Interval Training  No  No  No  No  No     Oxygen   Oxygen  Continuous  Continuous  Continuous  Continuous  Continuous   Liters  _0 Treadmill   MPH  1.8  1.8  1.8  1.8  1.8   Grade  0  0  0  0  0   Minutes  _1 Bike   Level  -  1  -  1  -   Minutes  -  17  -  17  -     NuStep   Level  _2 Minutes  _3 METs  2.1  2.1  2.1  2.4  2.3   Row Name 05/27/17 1200 06/03/17 1200 06/19/17 1200 07/03/17 1300 07/08/17 1200     Response to Exercise   Blood Pressure (Admit)   140/70  146/80  138/80  132/70  110/76   Blood Pressure (Exercise)  132/66  118/70  144/80  118/80  126/70   Blood Pressure (Exit)  118/68  120/80  122/80  112/62  108/76   Heart Rate (Admit)  88 bpm  99 bpm  77 bpm  83 bpm  84 bpm   Heart Rate (Exercise)  100 bpm  112 bpm  102 bpm  99 bpm  107 bpm   Heart Rate (Exit)  80 bpm  91 bpm  80 bpm  86 bpm  98 bpm   Oxygen Saturation (Admit)  98 %  99 %  95 %  96 %  96 %   Oxygen Saturation (Exercise)  88 %  87 %  84 %  92 %  86 % oxygen increased to 15L sat improved to 90%   Oxygen Saturation (Exit)  99 %  98 %  99 %  98 %  98 %   Rating of Perceived Exertion (Exercise)  _4 Perceived Dyspnea (Exercise)  _5 0  1   Duration  Continue with 45 min of aerobic exercise without signs/symptoms of physical distress.  Continue with 45 min of aerobic exercise without signs/symptoms of physical distress.  Continue with 45 min of aerobic exercise without signs/symptoms of physical distress.  Continue with 45 min of aerobic exercise without signs/symptoms of physical distress.  Continue with 45 min of aerobic exercise without signs/symptoms of physical distress.   Intensity  THRR unchanged  THRR unchanged  THRR unchanged  THRR unchanged  THRR unchanged     Progression   Progression  Continue to progress workloads to maintain intensity without signs/symptoms of physical distress.  Continue to progress workloads to maintain intensity without signs/symptoms of physical distress.  Continue to progress workloads to maintain intensity without signs/symptoms of physical distress.  Continue to progress workloads to maintain intensity without signs/symptoms of physical distress.  Continue to progress workloads to maintain intensity without signs/symptoms of physical distress.     Resistance Training   Training Prescription  Yes  Yes  Yes  Yes  Yes   Weight  blue bands  blue bands  blue bands  blue bands  blue bands   Reps  10-15  10-15  10-15  10-15   10-15   Time  10 Minutes  10 Minutes  10 Minutes  10 Minutes  10 Minutes     Interval Training   Interval Training  No  No  No  No  No     Oxygen   Oxygen  Continuous  Continuous  Continuous  Continuous  Continuous   Liters  _0 10-15     Treadmill   MPH  1.8  1.8  1.8  -  1.8   Grade  0  0  0  -  0   Minutes  _1 -  17     Bike   Level  1  -  -  1  1   Minutes  17  -  -  17  17     NuStep   Level  3 workload decreased not feeling well today  _2 Minutes  _3 METs  2.3  2.4  2.4  2.3  2     Home Exercise Plan   Plans to continue exercise at  -  -  -  -  Home (comment)   Frequency  -  -  -  -  Add 3 additional days to program exercise sessions.   Row Name 07/22/17 1200 07/24/17 1300 07/29/17 1200 08/14/17 1300 08/28/17 1300     Response to Exercise   Blood Pressure (Admit)  150/80  140/90  130/90  159/97  124/80   Blood Pressure (Exercise)  140/84  144/56  130/70  130/80  -   Blood Pressure (Exit)  100/72  112/74  116/70  118/82  122/68   Heart Rate (Admit)  90 bpm  83 bpm  90 bpm  68 bpm  94 bpm   Heart Rate (Exercise)  107 bpm  93 bpm  109 bpm  101 bpm  108 bpm   Heart Rate (Exit)  90 bpm  82 bpm  99 bpm  75 bpm  90 bpm   Oxygen Saturation (Admit)  97 %  94 %  97 %  99 %  96 %   Oxygen Saturation (Exercise)  87 % oxygen increased to 15L sat improved to 90%  83 % oxygen increased to 15L sat improved to 90%  92 %  81 %  85 %   Oxygen Saturation (Exit)  90 %  93 %  98 %  98 %  99 %   Rating of Perceived Exertion (Exercise)  _4 Perceived Dyspnea (Exercise)  _5 Duration  Continue with 45 min of aerobic exercise without signs/symptoms of physical distress.  Continue with 45 min of aerobic exercise without signs/symptoms of physical distress.  Continue with 45 min of aerobic exercise without signs/symptoms of physical distress.  Continue with 45 min of aerobic exercise without signs/symptoms of physical  distress.  Continue with 45 min of aerobic exercise without signs/symptoms of physical distress.   Intensity  THRR unchanged  THRR unchanged  THRR unchanged  THRR unchanged  THRR unchanged     Progression   Progression  Continue to progress workloads to maintain intensity without signs/symptoms of physical distress.  Continue to progress workloads to maintain intensity without signs/symptoms of physical distress.  Continue  to progress workloads to maintain intensity without signs/symptoms of physical distress.  Continue to progress workloads to maintain intensity without signs/symptoms of physical distress.  Continue to progress workloads to maintain intensity without signs/symptoms of physical distress.     Resistance Training   Training Prescription  Yes  Yes  Yes  Yes  Yes   Weight  blue bands  blue bands  blue bands  blue bands  blue bands   Reps  10-15  10-15  10-15  10-15  10-15   Time  10 Minutes  10 Minutes  10 Minutes  10 Minutes  10 Minutes     Interval Training   Interval Training  No  No  No  No  No     Oxygen   Oxygen  Continuous  Continuous  Continuous  Continuous  Continuous   Liters  10-15  10-15  10-15  10-15  10-15     Treadmill   MPH  1.8  1.9  1.8  1.9  1.9   Grade  0  1  0  0  0   Minutes  _0 -     Bike   Level  1  0.5  1  -  -   Minutes  _1 -  -     NuStep   Level  6  -  6  -  6   Minutes  17  -  17  -  17   METs  2.5  -  2  -  2.1   Row Name 09/02/17 1200 09/04/17 1200           Response to Exercise   Blood Pressure (Admit)  124/70  130/80      Blood Pressure (Exercise)  130/68  130/80      Blood Pressure (Exit)  110/64  130/88      Heart Rate (Admit)  89 bpm  96 bpm      Heart Rate (Exercise)  109 bpm  102 bpm      Heart Rate (Exit)  86 bpm  98 bpm      Oxygen Saturation (Admit)  97 %  98 %      Oxygen Saturation (Exercise)  90 %  91 %      Oxygen Saturation (Exit)  96 %  94 %      Rating of Perceived Exertion (Exercise)  13   11      Perceived Dyspnea (Exercise)  2  0      Duration  Continue with 45 min of aerobic exercise without signs/symptoms of physical distress.  Continue with 45 min of aerobic exercise without signs/symptoms of physical distress.      Intensity  THRR unchanged  THRR unchanged        Progression   Progression  Continue to progress workloads to maintain intensity without signs/symptoms of physical distress.  Continue to progress workloads to maintain intensity without signs/symptoms of physical distress.        Resistance Training   Training Prescription  Yes  Yes      Weight  blue bands  blue bands      Reps  10-15  10-15      Time  10 Minutes  10 Minutes        Interval Training   Interval Training  No  No        Oxygen  Oxygen  Continuous  Continuous      Liters  10-15  10-15        Treadmill   MPH  1.8  1.8      Grade  0  0      Minutes  17  17        NuStep   Level  6  6      Minutes  17  17      METs  1.8  2         Exercise Comments: Exercise Comments    Row Name 07/10/17 0745           Exercise Comments  Home exercise completed          Exercise Goals and Review: Exercise Goals    Row Name 04/25/17 1514             Exercise Goals   Increase Physical Activity  Yes       Intervention  Provide advice, education, support and counseling about physical activity/exercise needs.;Develop an individualized exercise prescription for aerobic and resistive training based on initial evaluation findings, risk stratification, comorbidities and participant's personal goals.       Expected Outcomes  Achievement of increased cardiorespiratory fitness and enhanced flexibility, muscular endurance and strength shown through measurements of functional capacity and personal statement of participant.       Increase Strength and Stamina  Yes       Intervention  Provide advice, education, support and counseling about physical activity/exercise needs.;Develop an individualized  exercise prescription for aerobic and resistive training based on initial evaluation findings, risk stratification, comorbidities and participant's personal goals.       Expected Outcomes  Achievement of increased cardiorespiratory fitness and enhanced flexibility, muscular endurance and strength shown through measurements of functional capacity and personal statement of participant.       Able to understand and use rate of perceived exertion (RPE) scale  Yes       Intervention  Provide education and explanation on how to use RPE scale       Expected Outcomes  Short Term: Able to use RPE daily in rehab to express subjective intensity level;Long Term:  Able to use RPE to guide intensity level when exercising independently       Able to understand and use Dyspnea scale  Yes       Intervention  Provide education and explanation on how to use Dyspnea scale       Expected Outcomes  Short Term: Able to use Dyspnea scale daily in rehab to express subjective sense of shortness of breath during exertion;Long Term: Able to use Dyspnea scale to guide intensity level when exercising independently       Knowledge and understanding of Target Heart Rate Range (THRR)  Yes       Intervention  Provide education and explanation of THRR including how the numbers were predicted and where they are located for reference       Expected Outcomes  Short Term: Able to state/look up THRR;Long Term: Able to use THRR to govern intensity when exercising independently;Short Term: Able to use daily as guideline for intensity in rehab       Understanding of Exercise Prescription  Yes       Intervention  Provide education, explanation, and written materials on patient's individual exercise prescription       Expected Outcomes  Short Term: Able to explain program exercise prescription;Long Term: Able to explain  home exercise prescription to exercise independently          Exercise Goals Re-Evaluation : Exercise Goals Re-Evaluation     Row Name 05/12/17 1335 06/14/17 1406 07/10/17 1615 07/28/17 1635 08/14/17 0737     Exercise Goal Re-Evaluation   Exercise Goals Review  Increase Strength and Stamina;Able to understand and use Dyspnea scale;Able to understand and use rate of perceived exertion (RPE) scale;Increase Physical Activity;Knowledge and understanding of Target Heart Rate Range (THRR);Understanding of Exercise Prescription  Increase Physical Activity;Increase Strength and Stamina;Able to understand and use Dyspnea scale;Able to understand and use rate of perceived exertion (RPE) scale;Knowledge and understanding of Target Heart Rate Range (THRR);Understanding of Exercise Prescription  Increase Physical Activity;Increase Strength and Stamina;Able to understand and use Dyspnea scale;Able to understand and use rate of perceived exertion (RPE) scale;Knowledge and understanding of Target Heart Rate Range (THRR);Understanding of Exercise Prescription  Increase Physical Activity;Increase Strength and Stamina;Able to understand and use Dyspnea scale;Able to understand and use rate of perceived exertion (RPE) scale;Knowledge and understanding of Target Heart Rate Range (THRR);Understanding of Exercise Prescription  Increase Strength and Stamina;Increase Physical Activity;Able to understand and use Dyspnea scale;Able to understand and use rate of perceived exertion (RPE) scale;Knowledge and understanding of Target Heart Rate Range (THRR);Understanding of Exercise Prescription   Comments  Patient has only attended one exercise session. Will cont. to monitor and progress.   Patient is struggling with exercise progression due to disease progression. METs range in the "low" level. Patient has purchased a treadmill and is going to start exercising at home. Will cont. to monitor and progress as able.   Patient is struggling with exercise progression due to disease progression. METs range in the "low" level. Has treadmill at home and has started his  home exercise routine. Will cont. to monitor and progress as able.   Patient is struggling with exercise progression due to disease progression. METs range in the "low" level. Has treadmill at home and has started his home exercise routine. Will cont. to monitor and progress as able.   Patient is struggling with exercise progression due to disease progression. METs range in the "low" level. Has treadmill at home and has started his home exercise routine. Will cont. to monitor and progress as able.    Expected Outcomes  Through the exercise here at rehab the patient with increase physical capacity, strength, and stamina.  Through the exercise here at rehab the patient with increase physical capacity, strength, and stamina.  Through exercise at rehab and at home, patient will increase strength and stamina making ADL's easier to perform. Patient will also have a better understanding of safe exercise and what they are capable to do outside of clinical supervision.  Through exercise at rehab and at home, patient will increase strength and stamina making ADL's easier to perform. Patient will also have a better understanding of safe exercise and what they are capable to do outside of clinical supervision.  Through exercise at rehab and at home, patient will increase strength and stamina making ADL's easier to perform. Patient will also have a better understanding of safe exercise and what they are capable to do outside of clinical supervision.   [place] Name 09/15/17 1602             Exercise Goal Re-Evaluation   Exercise Goals Review  Increase Strength and Stamina;Increase Physical Activity;Able to understand and use Dyspnea scale;Able to understand and use rate of perceived exertion (RPE) scale;Knowledge and understanding of Target Heart Rate  Range (THRR);Understanding of Exercise Prescription       Comments  Patient is struggling with exercise progression due to disease progression. METs range in the "low" level.  Has treadmill at home and has started his home exercise routine. Will cont. to monitor and progress as able.        Expected Outcomes  Through exercise at rehab and at home, patient will increase strength and stamina making ADL's easier to perform. Patient will also have a better understanding of safe exercise and what they are capable to do outside of clinical supervision.          Discharge Exercise Prescription (Final Exercise Prescription Changes): Exercise Prescription Changes - 09/04/17 1200      Response to Exercise   Blood Pressure (Admit)  130/80    Blood Pressure (Exercise)  130/80    Blood Pressure (Exit)  130/88    Heart Rate (Admit)  96 bpm    Heart Rate (Exercise)  102 bpm    Heart Rate (Exit)  98 bpm    Oxygen Saturation (Admit)  98 %    Oxygen Saturation (Exercise)  91 %    Oxygen Saturation (Exit)  94 %    Rating of Perceived Exertion (Exercise)  11    Perceived Dyspnea (Exercise)  0    Duration  Continue with 45 min of aerobic exercise without signs/symptoms of physical distress.    Intensity  THRR unchanged      Progression   Progression  Continue to progress workloads to maintain intensity without signs/symptoms of physical distress.      Resistance Training   Training Prescription  Yes    Weight  blue bands    Reps  10-15    Time  10 Minutes      Interval Training   Interval Training  No      Oxygen   Oxygen  Continuous    Liters  10-15      Treadmill   MPH  1.8    Grade  0    Minutes  17      NuStep   Level  6    Minutes  17    METs  2       Nutrition:  Target Goals: Understanding of nutrition guidelines, daily intake of sodium '1500mg'$ , cholesterol '200mg'$ , calories 30% from fat and 7% or less from saturated fats, daily to have 5 or more servings of fruits and vegetables.  Biometrics:    Nutrition Therapy Plan and Nutrition Goals: Nutrition Therapy & Goals - 06/19/17 1323      Nutrition Therapy   Diet  Therapeutic Lifestyle Changes       Personal Nutrition Goals   Nutrition Goal  Wt loss of 1-2 lb/week to a wt loss goal of 6-24 lb at graduation from Pulmonary Rehab    Personal Goal #2  Pt to increase fish consumption to at least 1-2 times/week    Personal Goal #3  Pt to increase consumption of fruits and vegetables from 0-1 cup per day to 2-3 cups per day.    Personal Goal #4  Pt to use nuts/peanut butter as a small snack or protein choice 2 times/week or more.      Intervention Plan   Intervention  Prescribe, educate and counsel regarding individualized specific dietary modifications aiming towards targeted core components such as weight, hypertension, lipid management, diabetes, heart failure and other comorbidities.    Expected Outcomes  Short Term Goal: Understand basic principles of  dietary content, such as calories, fat, sodium, cholesterol and nutrients.;Long Term Goal: Adherence to prescribed nutrition plan.       Nutrition Assessments: Nutrition Assessments - 06/19/17 1325      Rate Your Plate Scores   Pre Score  53       Nutrition Goals Re-Evaluation: Nutrition Goals Re-Evaluation    Row Name 05/12/17 1028             Goals   Current Weight  210 lb 5.1 oz (95.4 kg)       Nutrition Goal  Wt loss of 1-2 lb/week to a wt loss goal of 6-24 lb at graduation from Pulmonary Rehab       Comment  Pt has maintained his wt while in rehab. Wt loss goal wt not achieved. Pt has been limiting food sources of sodium and now usually compares and chooses lower sodium food options.          Personal Goal #2 Re-Evaluation   Personal Goal #2  Identify and limit food sources of sodium          Nutrition Goals Discharge (Final Nutrition Goals Re-Evaluation): Nutrition Goals Re-Evaluation - 05/12/17 1028      Goals   Current Weight  210 lb 5.1 oz (95.4 kg)    Nutrition Goal  Wt loss of 1-2 lb/week to a wt loss goal of 6-24 lb at graduation from Pulmonary Rehab    Comment  Pt has maintained his wt while in rehab. Wt  loss goal wt not achieved. Pt has been limiting food sources of sodium and now usually compares and chooses lower sodium food options.       Personal Goal #2 Re-Evaluation   Personal Goal #2  Identify and limit food sources of sodium       Psychosocial: Target Goals: Acknowledge presence or absence of significant depression and/or stress, maximize coping skills, provide positive support system. Participant is able to verbalize types and ability to use techniques and skills needed for reducing stress and depression.  Initial Review & Psychosocial Screening: Initial Psych Review & Screening - 04/25/17 1547      Initial Review   Current issues with  None Identified    Source of Stress Concerns  None Identified      Family Dynamics   Good Support System?  Yes      Barriers   Psychosocial barriers to participate in program  There are no identifiable barriers or psychosocial needs.       Quality of Life Scores:  Scores of 19 and below usually indicate a poorer quality of life in these areas.  A difference of  2-3 points is a clinically meaningful difference.  A difference of 2-3 points in the total score of the Quality of Life Index has been associated with significant improvement in overall quality of life, self-image, physical symptoms, and general health in studies assessing change in quality of life.   PHQ-9: Recent Review Flowsheet Data    Depression screen [facility] 2/9 04/25/2017 03/04/2017 11/15/2016   Decreased Interest 0 0 0   Down, Depressed, Hopeless 0 0 0   PHQ - 2 Score 0 0 0     Interpretation of Total Score  Total Score Depression Severity:  1-4 = Minimal depression, 5-9 = Mild depression, 10-14 = Moderate depression, 15-19 = Moderately severe depression, 20-27 = Severe depression   Psychosocial Evaluation and Intervention: Psychosocial Evaluation - 04/25/17 1548      Psychosocial Evaluation & Interventions  Interventions  Encouraged to exercise with the program and  follow exercise prescription    Continue Psychosocial Services   No Follow up required       Psychosocial Re-Evaluation: Psychosocial Re-Evaluation    [place] Name 05/16/17 1134 06/13/17 1132 07/07/17 1037 07/28/17 1742 08/21/17 0826     Psychosocial Re-Evaluation   Current issues with  Current Sleep Concerns  Current Sleep Concerns;Current Depression;History of Depression;Current Stress Concerns  Current Sleep Concerns;Current Depression;History of Depression;Current Stress Concerns  Current Sleep Concerns;Current Depression;History of Depression;Current Stress Concerns  Current Sleep Concerns;Current Depression;History of Depression;Current Stress Concerns   Comments  patient has a habit of waking at the same time every night and migrating to the kitchen and eating sweets. he has had behavior modification therapy in the past with success. he is incoraged to revisit need for therapy. this is keeping patient from getting a full nights rest and causing him to continue to gain weight.  patient has been prescribed antidepressant this week by pulmonologist. Will address stress of illness, depression, and sleep with patient over then next 4-6 weeks as medication takes effect  patient states he is beginning to feel an improvement in his mood, probably as a result of the antidepressant he has been placed on. He also states he is beginning to sleep through the night with only 1 awakening since wearing his CPAP with oxygen.  patient continues to see small improvements in his mood and this is reflected through his social engagements in the class. He is sleeping through the nights more.  patient continues to see small improvements in his mood and this is reflected through his social engagements in the class. He is sleeping through the nights more.   Expected Outcomes  patient will verbalize behavoir modification stratagies to help with getting up and eating with nighttime awakening  patient will verbalize behavoir  modification stratagies to help with getting up and eating with nighttime awakening and will verbalize he has more energy and enjoyment in everyday activities  patient beginning to use behavior modification stratagies that help with his eating sugary snacks with nighttime awakenings.  patient continues to use behavior modification stratagies that help with his eating sugary snacks with nighttime awakenings.  patient continues to use behavior modification stratagies that help with his eating sugary snacks with nighttime awakenings.   Interventions  Encouraged to attend Pulmonary Rehabilitation for the exercise  Encouraged to attend Pulmonary Rehabilitation for the exercise  Encouraged to attend Pulmonary Rehabilitation for the exercise  Encouraged to attend Pulmonary Rehabilitation for the exercise  Encouraged to attend Pulmonary Rehabilitation for the exercise   Continue Psychosocial Services   Follow up required by staff  Follow up required by staff folllow up required by MD  Follow up required by staff  Follow up required by staff  Follow up required by staff   Comments  -  worsening of chronic illness  -  worsening of chronic illness  worsening of chronic illness     Initial Review   Source of Stress Concerns  None Identified  Chronic Illness  -  Chronic Illness  Chronic Illness   Row Name 09/15/17 1219             Psychosocial Re-Evaluation   Current issues with  Current Sleep Concerns;Current Depression;History of Depression;Current Stress Concerns       Comments  patient continues to see more improvements in his mood and this is reflected through his social engagements in the class. He is sleeping  through the nights more.       Expected Outcomes  patient continues to use behavior modification stratagies that help with his eating sugary snacks with nighttime awakenings.       Interventions  Encouraged to attend Pulmonary Rehabilitation for the exercise       Continue Psychosocial Services    Follow up required by staff       Comments  worsening of chronic illness         Initial Review   Source of Stress Concerns  Chronic Illness          Psychosocial Discharge (Final Psychosocial Re-Evaluation): Psychosocial Re-Evaluation - 09/15/17 1219      Psychosocial Re-Evaluation   Current issues with  Current Sleep Concerns;Current Depression;History of Depression;Current Stress Concerns    Comments  patient continues to see more improvements in his mood and this is reflected through his social engagements in the class. He is sleeping through the nights more.    Expected Outcomes  patient continues to use behavior modification stratagies that help with his eating sugary snacks with nighttime awakenings.    Interventions  Encouraged to attend Pulmonary Rehabilitation for the exercise    Continue Psychosocial Services   Follow up required by staff    Comments  worsening of chronic illness      Initial Review   Source of Stress Concerns  Chronic Illness       Education: Education Goals: Education classes will be provided on a weekly basis, covering required topics. Participant will state understanding/return demonstration of topics presented.  Learning Barriers/Preferences:   Education Topics: Risk Factor Reduction:  -Group instruction that is supported by a PowerPoint presentation. Instructor discusses the definition of a risk factor, different risk factors for pulmonary disease, and how the heart and lungs work together.     Nutrition for Pulmonary Patient:  -Group instruction provided by PowerPoint slides, verbal discussion, and written materials to support subject matter. The instructor gives an explanation and review of healthy diet recommendations, which includes a discussion on weight management, recommendations for fruit and vegetable consumption, as well as protein, fluid, caffeine, fiber, sodium, sugar, and alcohol. Tips for eating when patients are short of breath are  discussed.   PULMONARY REHAB OTHER RESPIRATORY from 09/04/2017 in [place]  Date  08/28/17  Educator  RD  Instruction Review Code  2- meets goals/outcomes      Pursed Lip Breathing:  -Group instruction that is supported by demonstration and informational handouts. Instructor discusses the benefits of pursed lip and diaphragmatic breathing and detailed demonstration on how to preform both.     Oxygen Safety:  -Group instruction provided by PowerPoint, verbal discussion, and written material to support subject matter. There is an overview of "What is Oxygen" and "Why do we need it".  Instructor also reviews how to create a safe environment for oxygen use, the importance of using oxygen as prescribed, and the risks of noncompliance. There is a brief discussion on traveling with oxygen and resources the patient may utilize.   PULMONARY REHAB OTHER RESPIRATORY from 09/04/2017 in [place]  Date  06/19/17  Educator  Keri Tavella  Instruction Review Code  2- meets goals/outcomes      Oxygen Equipment:  -Group instruction provided by [employer] Staff utilizing handouts, written materials, and equipment demonstrations.   PULMONARY REHAB OTHER RESPIRATORY from 09/04/2017 in [place]  Date  01/16/17  Educator  [facility]  Instruction Review Code  2- meets goals/outcomes      Signs and Symptoms:  -Group instruction provided by written material and verbal discussion to support subject matter. Warning signs and symptoms of infection, stroke, and heart attack are reviewed and when to call the physician/911 reinforced. Tips for preventing the spread of infection discussed.   PULMONARY REHAB OTHER RESPIRATORY from 09/04/2017 in [place]  Date  05/15/17  Educator  rn  Instruction Review Code  2- meets goals/outcomes      Advanced Directives:  -Group instruction  provided by verbal instruction and written material to support subject matter. Instructor reviews Advanced Directive laws and proper instruction for filling out document.   Pulmonary Video:  -Group video education that reviews the importance of medication and oxygen compliance, exercise, good nutrition, pulmonary hygiene, and pursed lip and diaphragmatic breathing for the pulmonary patient.   PULMONARY REHAB OTHER RESPIRATORY from 09/04/2017 in [place]  Date  05/22/17  Instruction Review Code  2- meets goals/outcomes      Exercise for the Pulmonary Patient:  -Group instruction that is supported by a PowerPoint presentation. Instructor discusses benefits of exercise, core components of exercise, frequency, duration, and intensity of an exercise routine, importance of utilizing pulse oximetry during exercise, safety while exercising, and options of places to exercise outside of rehab.     PULMONARY REHAB OTHER RESPIRATORY from 09/04/2017 in [place]  Date  05/06/17  Educator  ep  Instruction Review Code  R- Review/reinforce      Pulmonary Medications:  -Verbally interactive group education provided by instructor with focus on inhaled medications and proper administration.   PULMONARY REHAB OTHER RESPIRATORY from 09/04/2017 in [place]  Date  01/09/17  Educator  Pharm  Instruction Review Code  2- meets goals/outcomes      Anatomy and Physiology of the Respiratory System and Intimacy:  -Group instruction provided by PowerPoint, verbal discussion, and written material to support subject matter. Instructor reviews respiratory cycle and anatomical components of the respiratory system and their functions. Instructor also reviews differences in obstructive and restrictive respiratory diseases with examples of each. Intimacy, Sex, and Sexuality differences are reviewed with a discussion on how  relationships can change when diagnosed with pulmonary disease. Common sexual concerns are reviewed.   MD DAY -A group question and answer session with a medical doctor that allows participants to ask questions that relate to their pulmonary disease state.   PULMONARY REHAB OTHER RESPIRATORY from 09/04/2017 in [place]  Date  09/04/17  Educator  yacoub  Instruction Review Code  R- Review/reinforce      OTHER EDUCATION -Group or individual verbal, written, or video instructions that support the educational goals of the pulmonary rehab program.   PULMONARY REHAB OTHER RESPIRATORY from 09/04/2017 in [place]  Date  07/03/17 [[employer]]  Educator  RD  Instruction Review Code  1- Verbalizes Understanding      Knowledge Questionnaire Score:   Core Components/Risk Factors/Patient Goals at Admission: Personal Goals and Risk Factors at Admission - 04/25/17 1546      Core Components/Risk Factors/Patient Goals on Admission   Improve shortness of breath with ADL's  Yes    Intervention  Provide education, individualized exercise plan and daily activity instruction to help decrease symptoms of SOB with activities of daily living.    Expected Outcomes  Short Term: Achieves a reduction of symptoms when performing activities of daily living.    Hypertension  Yes    Intervention  Provide education on lifestyle modifcations including regular physical activity/exercise, weight management, moderate sodium restriction and increased consumption of fresh fruit, vegetables, and low fat dairy, alcohol moderation, and smoking cessation.;Monitor prescription use compliance.    Expected Outcomes  Short Term: Continued assessment and intervention until BP is < 140/84m HG in hypertensive participants. < 130/869mHG in hypertensive participants with diabetes, heart failure or chronic kidney disease.;Long Term: Maintenance of blood pressure at  goal levels.       Core Components/Risk Factors/Patient Goals Review:  Goals and Risk Factor Review    Row Name 05/16/17 1132 06/13/17 1120 07/07/17 1028 07/28/17 1739 08/21/17 0822     Core Components/Risk Factors/Patient Goals Review   Personal Goals Review  Develop more efficient breathing techniques such as purse lipped breathing and diaphragmatic breathing and practicing self-pacing with activity.;Hypertension;Stress;Weight Management/Obesity;Improve shortness of breath with ADL's;Increase knowledge of respiratory medications and ability to use respiratory devices properly.  Develop more efficient breathing techniques such as purse lipped breathing and diaphragmatic breathing and practicing self-pacing with activity.;Hypertension;Stress;Weight Management/Obesity;Improve shortness of breath with ADL's;Increase knowledge of respiratory medications and ability to use respiratory devices properly.  Develop more efficient breathing techniques such as purse lipped breathing and diaphragmatic breathing and practicing self-pacing with activity.;Hypertension;Stress;Weight Management/Obesity;Improve shortness of breath with ADL's;Increase knowledge of respiratory medications and ability to use respiratory devices properly.  Develop more efficient breathing techniques such as purse lipped breathing and diaphragmatic breathing and practicing self-pacing with activity.;Hypertension;Stress;Weight Management/Obesity;Improve shortness of breath with ADL's;Increase knowledge of respiratory medications and ability to use respiratory devices properly.  Develop more efficient breathing techniques such as purse lipped breathing and diaphragmatic breathing and practicing self-pacing with activity.;Hypertension;Stress;Weight Management/Obesity;Improve shortness of breath with ADL's;Increase knowledge of respiratory medications and ability to use respiratory devices properly.   Review  this is patient second admission in  pulmonary rehab. he admits to not following through with his discharge home exercise plan and by being sedentary for the last several months patient has become deconditioned. It is too early in this admission to evaluate progress towards current program and personal goals.  Patient is attempting to be more compliant with activity prescribed at home. He voices feeling "worse" on a daily basis and has seen his pulmonologist this week for a full work-up. His BP remains high at times, his SOB is worsening and his oxygen requirements are increasing. Will have a better idea of treatment plan goals once all test results are reviewed by md.  patient has accepted his need for weekly exercise and is walking on his treadmill as prescribed. He is attempting to cut back on his "unhealthy" eating in order to loose weight. He verbalizes watching his sugar intake as it relates to inflammation. 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 His resting BP prior to exercise ranges from 120s-140s and during exertion 115-150s.  patient making very slow progression towards goals related to disease progression and deconditioning. He is to be commended on his weight loss of 7 lbs since admission. He has done this by limiting simple carbs such as sweet tea and drinking more water. He is also doing his best to limit nighttime snacking. His HTN has stabalized on current medication regimine and he seems to be less anxious and stressed over the progression of his disease. He continues to use PLB during exercise.  Patient continues to work  as hard as he can at each session in pulmonary rehab. He no longer tolerates workload increases and has had a recent appointment at his pulmonologist where both agreed that his disease has progressed. He recently spoke with the RD about his lack of appitite. Encourged patient to be a physically active as possible and to take as many restbreaks as necessary. It may be time to discuss maintaining his  physical abilitilies more than progression towards goals.   Expected Outcomes  see admission expected outcomes  see admission expected outcomes  see admission expected outcomes  see admission expected outcomes  see admission expected outcomes   Row Name 09/15/17 1213             Core Components/Risk Factors/Patient Goals Review   Personal Goals Review  Develop more efficient breathing techniques such as purse lipped breathing and diaphragmatic breathing and practicing self-pacing with activity.;Hypertension;Stress;Weight Management/Obesity;Improve shortness of breath with ADL's;Increase knowledge of respiratory medications and ability to use respiratory devices properly.       Review  Patient continues to work as hard as he can at each session in pulmonary rehab. He no longer tolerates workload increases. He understands his disease has progressed however recent conversations with patient indicate that he is no where near ready to "give up". He wants to continue to come to pulmonary rehab and enjoys the interactions with staff and classmates. He recently spoke with the RD about his lack of appitite. She gave him suggestions to manage GI upset from [facility] and those suggestions are working.Encourged patient to be a physically active as possible and to take as many restbreaks as necessary. We have discuss maintaining his physical abilitilies more than progression. This is OK with him. He is limited to home exercise currently related to the need for higher flow oxygen.       Expected Outcomes  see admission expected outcomes          Core Components/Risk Factors/Patient Goals at Discharge (Final Review):  Goals and Risk Factor Review - 09/15/17 1213      Core Components/Risk Factors/Patient Goals Review   Personal Goals Review  Develop more efficient breathing techniques such as purse lipped breathing and diaphragmatic breathing and practicing self-pacing with activity.;Hypertension;Stress;Weight  Management/Obesity;Improve shortness of breath with ADL's;Increase knowledge of respiratory medications and ability to use respiratory devices properly.    Review  Patient continues to work as hard as he can at each session in pulmonary rehab. He no longer tolerates workload increases. He understands his disease has progressed however recent conversations with patient indicate that he is no where near ready to "give up". He wants to continue to come to pulmonary rehab and enjoys the interactions with staff and classmates. He recently spoke with the RD about his lack of appitite. She gave him suggestions to manage GI upset from [facility] and those suggestions are working.Encourged patient to be a physically active as possible and to take as many restbreaks as necessary. We have discuss maintaining his physical abilitilies more than progression. This is OK with him. He is limited to home exercise currently related to the need for higher flow oxygen.    Expected Outcomes  see admission expected outcomes       ITP Comments:   Comments: Patient has attended 28 pulmonary rehab sessions since admission.

## 2017-09-16 NOTE — Progress Notes (Signed)
Daily Session Note  Patient Details  Name: ARINZE RIVADENEIRA MRN: [id number] Date of Birth: [birth year] Referring Provider:     Pulmonary Rehab Walk Test from 04/29/2017 in [place]  Referring Provider  Dr. [name]      Encounter Date: 09/16/2017  Check In: Session Check In - 09/16/17 1011      Check-In   Location  [facility]    Staff Present  Su Hilt, MS, ACSM RCEP, Exercise Physiologist;Portia Rollene Rotunda, RN, Maxcine Ham, RN, Roque Cash, RN    Supervising physician immediately available to respond to emergencies  [facility] immediately available    Physician(s)  Dr. Zigmund Daniel    Medication changes reported      No    Fall or balance concerns reported     No    Tobacco Cessation  No Change    Warm-up and Cool-down  Performed as group-led instruction    Resistance Training Performed  Yes    VAD Patient?  No      Pain Assessment   Currently in Pain?  No/denies    Multiple Pain Sites  No       Capillary Blood Glucose: No results found for this or any previous visit (from the past 24 hour(s)).  Exercise Prescription Changes - 09/16/17 1200      Response to Exercise   Blood Pressure (Admit)  134/70    Blood Pressure (Exercise)  110/66    Blood Pressure (Exit)  104/64    Heart Rate (Admit)  90 bpm    Heart Rate (Exercise)  115 bpm    Heart Rate (Exit)  99 bpm    Oxygen Saturation (Admit)  98 %    Oxygen Saturation (Exercise)  88 %    Oxygen Saturation (Exit)  90 %    Rating of Perceived Exertion (Exercise)  11    Perceived Dyspnea (Exercise)  1    Duration  Continue with 45 min of aerobic exercise without signs/symptoms of physical distress.    Intensity  THRR unchanged      Progression   Progression  Continue to progress workloads to maintain intensity without signs/symptoms of physical distress.      Resistance Training   Training Prescription  Yes    Weight  blue bands    Reps  10-15    Time   10 Minutes      Interval Training   Interval Training  No      Oxygen   Oxygen  Continuous    Liters  10-15      Treadmill   MPH  1.8    Grade  0    Minutes  17      Bike   Level  1    Minutes  17      NuStep   Level  6    Minutes  17    METs  1.7       Social History   Tobacco Use  Smoking Status Former Smoker  . Packs/day: 3.00  . Years: 17.00  . Pack years: 51.00  . Types: Cigarettes  . Last attempt to quit: 08/26/1977  . Years since quitting: 40.0  Smokeless Tobacco Never Used    Goals Met:  Exercise tolerated well Strength training completed today  Goals Unmet:  Not Applicable  Comments: Service time is from 1030 to 1200    Dr. Rush Farmer is Medical Director for Pulmonary Rehab at [facility].

## 2017-09-18 ENCOUNTER — Encounter (HOSPITAL_COMMUNITY): Payer: Medicare Other

## 2017-09-18 ENCOUNTER — Telehealth (HOSPITAL_COMMUNITY): Payer: Self-pay | Admitting: Family Medicine

## 2017-09-23 ENCOUNTER — Encounter (HOSPITAL_COMMUNITY)
Admission: RE | Admit: 2017-09-23 | Discharge: 2017-09-23 | Disposition: A | Discharge status: Home / Self Care | Payer: Medicare Other | Source: Ambulatory Visit | Attending: Pulmonary Disease | Admitting: Pulmonary Disease

## 2017-09-23 VITALS — Wt 204.6 lb

## 2017-09-23 DIAGNOSIS — J849 Interstitial pulmonary disease, unspecified: Secondary | ICD-10-CM | POA: Diagnosis not present

## 2017-09-23 NOTE — Progress Notes (Signed)
Daily Session Note  Patient Details  Name: Jeffrey Cobb MRN: [id number] Date of Birth: 12/15/1940 Referring Provider:     Pulmonary Rehab Walk Test from 04/29/2017 in [facility]  Referring Provider  Dr. McQuaid      Encounter Date: 09/23/2017  Check In: Session Check In - 09/23/17 1210      Check-In   Location  [facility]    Staff Present   , MS, ACSM RCEP, Exercise Physiologist;Portia Payne, RN, BSN;Joan Behrens, RN, BSN;Lisa Hughes, RN    Supervising physician immediately available to respond to emergencies  [facility] immediately available    Physician(s)  Dr. Grunz    Medication changes reported      No    Fall or balance concerns reported     No    Tobacco Cessation  No Change    Warm-up and Cool-down  Performed as group-led instruction    Resistance Training Performed  Yes    VAD Patient?  No      Pain Assessment   Currently in Pain?  No/denies    Multiple Pain Sites  No       Capillary Blood Glucose: No results found for this or any previous visit (from the past 24 hour(s)).  Exercise Prescription Changes - 09/23/17 1200      Response to Exercise   Blood Pressure (Admit)  124/60    Blood Pressure (Exercise)  142/72    Blood Pressure (Exit)  124/60    Heart Rate (Admit)  96 bpm    Heart Rate (Exercise)  105 bpm    Heart Rate (Exit)  82 bpm    Oxygen Saturation (Admit)  94 %    Oxygen Saturation (Exercise)  86 %    Oxygen Saturation (Exit)  98 %    Rating of Perceived Exertion (Exercise)  11    Perceived Dyspnea (Exercise)  1    Duration  Continue with 45 min of aerobic exercise without signs/symptoms of physical distress.    Intensity  THRR unchanged      Progression   Progression  Continue to progress workloads to maintain intensity without signs/symptoms of physical distress.      Resistance Training   Training Prescription  Yes    Weight  blue bands    Reps  10-15    Time  10  Minutes      Interval Training   Interval Training  No      Oxygen   Oxygen  Continuous    Liters  10-15      Treadmill   MPH  1.8    Grade  0    Minutes  17      Bike   Level  1    Minutes  17      NuStep   Level  6    Minutes  17    METs  2.5       Social History   Tobacco Use  Smoking Status Former Smoker  . Packs/day: 3.00  . Years: 17.00  . Pack years: 51.00  . Types: Cigarettes  . Last attempt to quit: 08/26/1977  . Years since quitting: 40.1  Smokeless Tobacco Never Used    Goals Met:  Exercise tolerated well No report of cardiac concerns or symptoms Strength training completed today  Goals Unmet:  Not Applicable  Comments: Service time is from 10:30a to 12:05p    Dr. Wesam G. Yacoub is Medical   Director for Pulmonary Rehab at [facility]. 

## 2017-09-24 ENCOUNTER — Ambulatory Visit (INDEPENDENT_AMBULATORY_CARE_PROVIDER_SITE_OTHER): Payer: Medicare Other | Admitting: Pulmonary Disease

## 2017-09-24 ENCOUNTER — Encounter: Payer: Self-pay | Admitting: Pulmonary Disease

## 2017-09-24 VITALS — BP 116/64 | HR 78 | Ht 67.0 in | Wt 203.6 lb

## 2017-09-24 DIAGNOSIS — J84112 Idiopathic pulmonary fibrosis: Secondary | ICD-10-CM

## 2017-09-24 MED ORDER — DOXYCYCLINE HYCLATE 100 MG PO TABS: 100.0000 mg | ORAL_TABLET | Freq: Two times a day (BID) | ORAL | 0 refills | Status: DC

## 2017-09-24 MED ORDER — GUAIFENESIN ER 600 MG PO TB12: 600.0000 mg | ORAL_TABLET | Freq: Two times a day (BID) | ORAL | 3 refills | Status: AC

## 2017-09-24 NOTE — Progress Notes (Signed)
Subjective:    Patient ID: Jeffrey Cobb, male    DOB: 01-Jan-1941, 77 y.o.   MRN: [id number]  Synopsis: Former patient of Dr. 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 Follow-up    Pt is currently in pulm rehab, and uses 10 liters O2 with exertion   Jeffrey Cobb has been feeling a little worse lately.  He says that he is doing the best he can lately.  He says taht he and his wife have been having long conversations about his mental health.  Jeffrey Cobb has been talking to them about living with a chronic disease.  He says that he has been feeling severely depressed.  He has very little interest in anything that he used to enjoy previously.  He feels a lack of energy.  He has been trying to go to an art class but even that he has little desire to do that.  Cough: he has a bad coughing cycle from time to time.  Some times mucus comes, up, not as bad now.  Chronic respiratory failure with hypoxemia: 5L O2 at rest, 10L with exercise.   He is still doing rehab.     Past Medical History:  Diagnosis Date  . Acute sinusitis, unspecified   . Arthritis   . 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 GERD (gastroesophageal reflux disease)   . Left anterior fascicular block    hx of on ekg  . Mixed hyperlipidemia   . Obstructive chronic bronchitis with exacerbation (HCC)   . Occlusion and stenosis of carotid artery without mention of cerebral infarction    a. 05/2013 Carotid U/S: 1-39% bilat stenosis.  . Other emphysema (HCC)   . 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 Sleep apnea    does not use cpap due to cough  . Stroke [facility]) 05/2015      Review of Systems  Constitutional: Negative for chills, fatigue and fever.  HENT:  Positive for rhinorrhea. Negative for sinus pressure and sneezing.   Respiratory: Positive for cough. Negative for shortness of breath and wheezing.   Cardiovascular: Negative for chest pain, palpitations and leg swelling.       Objective:   Physical Exam Vitals:   09/24/17 1419  BP: 116/64  Pulse: 78  SpO2: 98%  Weight: 203 lb 9.6 oz (92.4 kg)  Height: 5\' 7"  (1.702 m)   3L pm pulse  Gen: chronically ill appearing HENT: OP clear, TM's clear, neck supple PULM: Crackles bases B, normal percussion CV: RRR, no mgr, trace edema GI: BS+, soft, nontender Derm: no cyanosis or rash Psyche: normal mood and affect     CBC    Component Value Date/Time   WBC 6.7 05/31/2015 1730   RBC 4.64 05/31/2015 1730   HGB 14.6 05/31/2015 1730   HCT 41.5 05/31/2015 1730   PLT 138 (L) 05/31/2015 1730   MCV 89.4 05/31/2015 1730   MCH 31.5 05/31/2015 1730   MCHC 35.2 05/31/2015 1730   RDW 13.4 05/31/2015 1730   LYMPHSABS 2.2 09/02/2014 1204   MONOABS 0.7 09/02/2014 1204   EOSABS 0.2 09/02/2014 1204   BASOSABS 0.0 09/02/2014 1204     PFT PFT"s 2009:  FEV1  3.49 (115%), ratio 65, DLCO 67%. PFT's 08/2014:  FEV1 3.13 (114%), ratio 69, decreased airtrapping from 2009, DLCO 49% June 2017 pulmonary function testing FEV1 2.99 L (111% predicted, FVC 3.67 L (98% predicted), total lung capacity 5.53 L (85% predicted), DLCO 12.76 (45% predicted). March 2018 pulmonary function testing ratio 80%, FVC 2.94 L 79% predicted, total lung capacity 4.51 L 69% predicted, DLCO 9.05 31% predicted 05/2017 PFT> FVC 2.6L, DLCO 7.1 24% pred  Imaging: 01/2016 HRCT > traction bronchiolectasis and interlobular septal thickening in a basilar predominance have progressed compared to July 2016, there remains no frank honeycombing. There are multiple pulmonary nodules which are stable or have resolved. February 2018 high-resolution CT chest independently reviewed showing traction bronchiectasis, interlobular septal thickening  and honeycombing worse in the periphery and bases.  6 minute walk test: August 2016 6 minute walk test 336 m O2 sats ration 92% on room air 04/27/2016 6 minute walk distance 292.5 m O2 sats ration and completion of test was 82% on room air. July 2018 6 minute walk: Distance 1231 feet, O2 saturation 88% on 10 L nasal cannula October 2018 distance 216 m  Other testing: September 2017 pH probe showed normal pH and no clear evidence of gastroesophageal reflux.  2018 echocardiogram normal LVEF, RVSP 38  CPAP compliance report 02/2017> only using 39% of nights       Assessment & Plan:  No diagnosis found.  Discussion: Jeffrey Cobb is slowly getting worse.  He is now using rest.  I explained to him today that he has a disease that man cannot cure and I am concerned about him.  I did not bring up hospice.  He is struggling with depression right now.  I talked to his team at rehab and they say that they are concerned about his mental health.  I think the best approach at this point is to keep him in rehab, continue taking Ofev, and to focus on depression.  I believe he will need hospice this year.  Chronic respiratory failure with hypoxemia: Continue 5 L at rest, 10 L with exercise  Idiopathic pulmonary fibrosis: I do not think there is value in repeating lung function testing right now Continue Ofev  Cough: Use Mucinex as needed to help with chest congestion If you have several days in a row of increasing mucus production, change in color of the mucus or increasing shortness of breath then go ahead and take the doxycycline I gave you and call me to let me know  Depression: Keep taking your current medicines I am going to refer you to Dr. Jennelle Human for further evaluation  We will see you back in 6 weeks with either myself or the nurse practitioner   Current Outpatient Medications:  .  albuterol (PROVENTIL) (2.5 MG/3ML) 0.083% nebulizer solution, Take 3 mLs (2.5 mg total) by nebulization  every 6 (six) hours as needed for wheezing or shortness of breath., Disp: 360 mL, Rfl: 11 .  aspirin 81 MG tablet, Take 81 mg by mouth daily., Disp: , Rfl:  .  atorvastatin (LIPITOR) 40 MG tablet, Take 1 tablet (40 mg total) by mouth daily at 6 PM., Disp: 30 tablet, Rfl: 0 .  chlorpheniramine-HYDROcodone (TUSSIONEX PENNKINETIC ER) 10-8 MG/5ML SUER, Take 5 mLs by mouth every 12 (twelve) hours as needed for cough., Disp: 140 mL, Rfl: 0 .  cyanocobalamin 500 MCG tablet, Take 1,000 mcg by mouth daily., Disp: , Rfl:  .  DULoxetine (CYMBALTA) 20 MG capsule, Take 2 capsules (40 mg  total) by mouth daily., Disp: 60 capsule, Rfl: 4 .  Nintedanib (OFEV) 150 MG CAPS, Take 150 mg by mouth 2 (two) times daily., Disp: 60 capsule, Rfl:  .  oxymetazoline (AFRIN) 0.05 % nasal spray, Place 1 spray into both nostrils as needed for congestion., Disp: , Rfl:  .  pramipexole (MIRAPEX) 0.5 MG tablet, TAKE 1 TABLET BY MOUTH EVERY MORNING AND TAKE 1 TABLET EVERY EVENING, Disp: 180 tablet, Rfl: 3 .  PROAIR HFA 108 (90 BASE) MCG/ACT inhaler, Inhale 2 puffs into the lungs every 6 (six) hours as needed., Disp: , Rfl:  .  QUEtiapine (SEROQUEL) 100 MG tablet, , Disp: , Rfl:  .  sodium chloride HYPERTONIC 3 % nebulizer solution, Take by nebulization 2 (two) times daily as needed for other., Disp: 224 mL, Rfl: 12 .  triamcinolone cream (KENALOG) 0.1 %, APPLY 1 APPLICATION ON THE SKIN TWICE A DAY AS NEEDED, Disp: , Rfl: 2

## 2017-09-24 NOTE — Patient Instructions (Signed)
Chronic respiratory failure with hypoxemia: Continue 5 L at rest, 10 L with exercise  Idiopathic pulmonary fibrosis: I do not think there is value in repeating lung function testing right now Continue Ofev  Cough: Use Mucinex as needed to help with chest congestion If you have several days in a row of increasing mucus production, change in color of the mucus or increasing shortness of breath then go ahead and take the doxycycline I gave you and call me to let me know  Depression: Keep taking your current medicines I am going to refer you to Dr. Jennelle Humanottle for further evaluation  We will see you back in 6 weeks with either myself or the nurse practitioner

## 2017-09-25 ENCOUNTER — Encounter (HOSPITAL_COMMUNITY)
Admission: RE | Admit: 2017-09-25 | Discharge: 2017-09-25 | Disposition: A | Discharge status: Home / Self Care | Payer: Medicare Other | Source: Ambulatory Visit | Attending: Pulmonary Disease | Admitting: Pulmonary Disease

## 2017-09-25 DIAGNOSIS — J849 Interstitial pulmonary disease, unspecified: Secondary | ICD-10-CM

## 2017-09-25 NOTE — Progress Notes (Signed)
Daily Session Note  Patient Details  Name: Jeffrey Cobb MRN: [id number] Date of Birth: 1941-03-12 Referring Provider:     Pulmonary Rehab Walk Test from 04/29/2017 in [place]  Referring Provider  Dr. [name]      Encounter Date: 09/25/2017  Check In: Session Check In - 09/25/17 1103      Check-In   Location  [facility]    Staff Present  Su Hilt, MS, ACSM RCEP, Exercise Physiologist;Shahana Capes Rollene Rotunda, RN, Maxcine Ham, RN, Roque Cash, RN    Supervising physician immediately available to respond to emergencies  [facility] immediately available    Physician(s)   Dr. Tyrell Antonio    Medication changes reported      No    Fall or balance concerns reported     No    Tobacco Cessation  No Change    Warm-up and Cool-down  Performed as group-led instruction    Resistance Training Performed  Yes    VAD Patient?  No      Pain Assessment   Currently in Pain?  No/denies    Multiple Pain Sites  No       Capillary Blood Glucose: No results found for this or any previous visit (from the past 24 hour(s)).    Social History   Tobacco Use  Smoking Status Former Smoker  . Packs/day: 3.00  . Years: 17.00  . Pack years: 51.00  . Types: Cigarettes  . Last attempt to quit: 08/26/1977  . Years since quitting: 40.1  Smokeless Tobacco Never Used    Goals Met:  Independence with exercise equipment Improved SOB with ADL's Using PLB without cueing & demonstrates good technique Exercise tolerated well No report of cardiac concerns or symptoms Strength training completed today  Goals Unmet:  Not Applicable  Comments: Service time is from 1115 to 1330 Patient attended an education session on chronic illness and mental health   Dr. Rush Farmer is Medical Director for Pulmonary Rehab at [facility].

## 2017-09-30 ENCOUNTER — Encounter (HOSPITAL_COMMUNITY): Admission: RE | Admit: 2017-09-30 | Payer: Medicare Other | Source: Ambulatory Visit

## 2017-09-30 ENCOUNTER — Telehealth (HOSPITAL_COMMUNITY): Payer: Self-pay | Admitting: Family Medicine

## 2017-10-02 ENCOUNTER — Encounter (HOSPITAL_COMMUNITY)
Admission: RE | Admit: 2017-10-02 | Discharge: 2017-10-02 | Disposition: A | Discharge status: Home / Self Care | Payer: Medicare Other | Source: Ambulatory Visit | Attending: Pulmonary Disease | Admitting: Pulmonary Disease

## 2017-10-02 DIAGNOSIS — J849 Interstitial pulmonary disease, unspecified: Secondary | ICD-10-CM | POA: Insufficient documentation

## 2017-10-02 NOTE — Progress Notes (Signed)
Daily Session Note  Patient Details  Name: Jeffrey Cobb MRN: [id number] Date of Birth: 11/29/1940 Referring Provider:     Pulmonary Rehab Walk Test from 04/29/2017 in [facility]  Referring Provider  Dr. McQuaid      Encounter Date: 10/02/2017  Check In: Session Check In - 10/02/17 1049      Check-In   Location  [facility]    Staff Present   , MS, ACSM RCEP, Exercise Physiologist;Portia Payne, RN, BSN;Joan Behrens, RN, BSN;Lisa Hughes, RN    Supervising physician immediately available to respond to emergencies  [facility] immediately available    Physician(s)  Dr. Emokpae    Medication changes reported      No    Fall or balance concerns reported     No    Tobacco Cessation  No Change    Warm-up and Cool-down  Performed as group-led instruction    Resistance Training Performed  Yes    VAD Patient?  No      Pain Assessment   Currently in Pain?  No/denies    Multiple Pain Sites  No       Capillary Blood Glucose: No results found for this or any previous visit (from the past 24 hour(s)).    Social History   Tobacco Use  Smoking Status Former Smoker  . Packs/day: 3.00  . Years: 17.00  . Pack years: 51.00  . Types: Cigarettes  . Last attempt to quit: 08/26/1977  . Years since quitting: 40.1  Smokeless Tobacco Never Used    Goals Met:  Exercise tolerated well No report of cardiac concerns or symptoms Strength training completed today  Goals Unmet:  Not Applicable  Comments: Service time is from 10:30a to 12:25p    Dr. Wesam G. Yacoub is Medical Director for Pulmonary Rehab at [facility]. 

## 2017-10-03 ENCOUNTER — Telehealth: Payer: Self-pay | Admitting: Pulmonary Disease

## 2017-10-03 NOTE — Telephone Encounter (Signed)
Spoke with Jeffrey Cobb. She stated that the patient would need to search and find a psychiatrist within their network. Looked in patient's chart and noticed that a 2nd referral had been placed and [facility].   Jeffrey JacobsonHelen verbalized understanding. Nothing else needed at time of call.

## 2017-10-03 NOTE — Telephone Encounter (Signed)
P[facility]'s can we send pt to another psychiatrist within their network?  Diagnosis Information   Diagnosis  J84.112 (ICD-10-CM) - IPF (idiopathic pulmonary fibrosis) (HCC)  Referral Notes  Number of Notes: 2  Type Date User Summary Attachment  General 09/29/2017 2:39 PM Elisabeth MostStevenson, Ava L, LCAS-A - -  Note   Writer left a voice mail message in order to schedule an appt.          Type Date User Summary Attachment  Provider Comments 09/24/2017 3:45 PM Cornell BarmanWhittaker, Kelli M, CMA Provider Comments -  Note   Pt is needing [facility] as [facility]

## 2017-10-06 ENCOUNTER — Ambulatory Visit: Payer: Medicare Other | Admitting: Neurology

## 2017-10-06 ENCOUNTER — Telehealth: Payer: Self-pay | Admitting: Pulmonary Disease

## 2017-10-06 DIAGNOSIS — F19982 Other psychoactive substance use, unspecified with psychoactive substance-induced sleep disorder: Secondary | ICD-10-CM

## 2017-10-06 NOTE — Telephone Encounter (Signed)
This was [facility] last week [facility] the pt is instructed to call that office for the appt if dr cottle's office is not seeing the referral maybe it was not put in correctly I am not sure but a nurse will have to habdle this thanks Tobe SosSally E Ottinger

## 2017-10-06 NOTE — Telephone Encounter (Signed)
[facility] like the [facility] was sent on 1.30.19

## 2017-10-06 NOTE — Telephone Encounter (Signed)
Fixed order, placed referral to psychiatry with Dr. Jennelle Humanottle. Called patient and advised him that he would have to contact office to set up appointment and answer questions that they ask, phone number given. Nothing further needed.

## 2017-10-07 ENCOUNTER — Encounter (HOSPITAL_COMMUNITY)
Admission: RE | Admit: 2017-10-07 | Discharge: 2017-10-07 | Disposition: A | Discharge status: Home / Self Care | Payer: Medicare Other | Source: Ambulatory Visit

## 2017-10-07 DIAGNOSIS — J849 Interstitial pulmonary disease, unspecified: Secondary | ICD-10-CM

## 2017-10-07 NOTE — Progress Notes (Signed)
Pulmonary Individual Treatment Plan  Patient Details  Name: Jeffrey Cobb MRN: [id number] Date of Birth: 08-Jun-1941 Referring Provider:     Pulmonary Rehab Walk Test from 04/29/2017 in [place]  Referring Provider  Dr. [name]      Initial Encounter Date:    Pulmonary Rehab Walk Test from 04/29/2017 in [place]  Date  04/29/17  Referring Provider  Dr. [name]      Visit Diagnosis: ILD (interstitial lung disease) ([place])  Patient's Home Medications on Admission:   Current Outpatient Medications:  .  albuterol (PROVENTIL) (2.5 MG/3ML) 0.083% nebulizer solution, Take 3 mLs (2.5 mg total) by nebulization every 6 (six) hours as needed for wheezing or shortness of breath., Disp: 360 mL, Rfl: 11 .  aspirin 81 MG tablet, Take 81 mg by mouth daily., Disp: , Rfl:  .  atorvastatin (LIPITOR) 40 MG tablet, Take 1 tablet (40 mg total) by mouth daily at 6 PM., Disp: 30 tablet, Rfl: 0 .  chlorpheniramine-HYDROcodone (TUSSIONEX PENNKINETIC ER) 10-8 MG/5ML SUER, Take 5 mLs by mouth every 12 (twelve) hours as needed for cough., Disp: 140 mL, Rfl: 0 .  cyanocobalamin 500 MCG tablet, Take 1,000 mcg by mouth daily., Disp: , Rfl:  .  doxycycline (VIBRA-TABS) 100 MG tablet, Take 1 tablet (100 mg total) by mouth 2 (two) times daily., Disp: 10 tablet, Rfl: 0 .  DULoxetine (CYMBALTA) 20 MG capsule, Take 2 capsules (40 mg total) by mouth daily., Disp: 60 capsule, Rfl: 4 .  guaiFENesin (MUCINEX) 600 MG 12 hr tablet, Take 1 tablet (600 mg total) by mouth 2 (two) times daily., Disp: 1 tablet, Rfl: 3 .  Nintedanib (OFEV) 150 MG CAPS, Take 150 mg by mouth 2 (two) times daily., Disp: 60 capsule, Rfl:  .  oxymetazoline (AFRIN) 0.05 % nasal spray, Place 1 spray into both nostrils as needed for congestion., Disp: , Rfl:  .  pramipexole (MIRAPEX) 0.5 MG tablet, TAKE 1 TABLET BY MOUTH EVERY MORNING AND TAKE 1 TABLET EVERY EVENING, Disp: 180 tablet, Rfl: 3 .   PROAIR HFA 108 (90 BASE) MCG/ACT inhaler, Inhale 2 puffs into the lungs every 6 (six) hours as needed., Disp: , Rfl:  .  QUEtiapine (SEROQUEL) 100 MG tablet, , Disp: , Rfl:  .  sodium chloride HYPERTONIC 3 % nebulizer solution, Take by nebulization 2 (two) times daily as needed for other., Disp: 224 mL, Rfl: 12 .  triamcinolone cream (KENALOG) 0.1 %, APPLY 1 APPLICATION ON THE SKIN TWICE A DAY AS NEEDED, Disp: , Rfl: 2  Past Medical History: Past Medical History:  Diagnosis Date  . Acute sinusitis, unspecified   . Arthritis   . 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 GERD (gastroesophageal reflux disease)   . Left anterior fascicular block    hx of on ekg  . Mixed hyperlipidemia   . Obstructive chronic bronchitis with exacerbation ([place])   . Occlusion and stenosis of carotid artery without mention of cerebral infarction    a. 05/2013 Carotid U/S: 1-39% bilat stenosis.  . Other emphysema ([place])   . 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 Sleep apnea    does not use cpap due to cough  . Stroke [facility]) 05/2015    Tobacco Use: Social History   Tobacco Use  Smoking Status Former Smoker  . Packs/day: 3.00  . Years: 17.00  . Pack years: 51.00  .  Types: Cigarettes  . Last attempt to quit: 08/26/1977  . Years since quitting: 40.1  Smokeless Tobacco Never Used    Labs: Recent Chemical engineer    Labs for ITP Cardiac and Pulmonary Rehab Latest Ref Rng & Units 10/27/2007 11/01/2008 01/17/2011 06/01/2015   Cholestrol 0 - 200 mg/dL 175 169 165 170   LDLCALC 0 - 99 mg/dL 108(H) 105(H) 90 103(H)   HDL >40 mg/dL 57.8 52.5 67.40 58   Trlycerides <150 mg/dL 44 59 40.0 46   Hemoglobin A1c 4.8 - 5.6 % - - - 5.7(H)      Capillary Blood Glucose: No results found for: GLUCAP   Pulmonary Assessment Scores:   Pulmonary Function Assessment: Pulmonary Function Assessment - 04/25/17 1543      Breath   Bilateral Breath  Sounds  -- clear upper fine crackles lower    Shortness of Breath  Yes;Limiting activity       Exercise Target Goals:    Exercise Program Goal: Individual exercise prescription set using results from initial 6 min walk test and THRR while considering  patient's activity barriers and safety.    Exercise Prescription Goal: Initial exercise prescription builds to 30-45 minutes a day of aerobic activity, 2-3 days per week.  Home exercise guidelines will be given to patient during program as part of exercise prescription that the participant will acknowledge.  Activity Barriers & Risk Stratification: Activity Barriers & Cardiac Risk Stratification - 04/25/17 1512      Activity Barriers & Cardiac Risk Stratification   Activity Barriers  Joint Problems;Arthritis;Back Problems;Deconditioning;Shortness of Breath;Left Knee Replacement;Right Knee Replacement;Neck/Spine Problems       6 Minute Walk: 6 Minute Walk    Row Name 04/29/17 1619         6 Minute Walk   Phase  Discharge     Distance  1300 feet     Walk Time  6 minutes     # of Rest Breaks  0     MPH  2.46     METS  2.84     RPE  11     Perceived Dyspnea   1     Symptoms  Yes (comment)     Comments  USED WHEELCHAIR     Resting HR  101 bpm     Resting BP  150/92     Resting Oxygen Saturation   95 %     Exercise Oxygen Saturation  during 6 min walk  87 %     Max Ex. HR  118 bpm     Max Ex. BP  155/92       Interval HR   1 Minute HR  101     2 Minute HR  104     3 Minute HR  118     5 Minute HR  115     6 Minute HR  112     2 Minute Post HR  111     Interval Heart Rate?  Yes       Interval Oxygen   Interval Oxygen?  Yes     Baseline Oxygen Saturation %  95 %     1 Minute Oxygen Saturation %  93 %     1 Minute Liters of Oxygen  6 L     2 Minute Oxygen Saturation %  87 %     2 Minute Liters of Oxygen  6 L     3 Minute Oxygen Saturation %  86 %     3 Minute Liters of Oxygen  8 L     4 Minute Oxygen Saturation %   87 %     4 Minute Liters of Oxygen  8 L     5 Minute Oxygen Saturation %  87 %     5 Minute Liters of Oxygen  8 L     6 Minute Oxygen Saturation %  87 %     6 Minute Liters of Oxygen  8 L     2 Minute Post Oxygen Saturation %  90 %     2 Minute Post Liters of Oxygen  8 L        Oxygen Initial Assessment: Oxygen Initial Assessment - 10/02/17 1618      Home Oxygen   Home Oxygen Device  Home Concentrator;E-Tanks;Portable Concentrator    Sleep Oxygen Prescription  CPAP    Liters per minute  4    Home Exercise Oxygen Prescription  Continuous    Liters per minute  10    Home at Rest Exercise Oxygen Prescription  Continuous    Liters per minute  4    Compliance with Home Oxygen Use  Yes      Program Oxygen Prescription   Program Oxygen Prescription  Continuous;E-Tanks    Liters per minute  15    Comments  had to progress to 15 lpm with treadmill walking at most recent exercise session for desaturation to 86% on 10 liters      Intervention   Short Term Goals  To learn and exhibit compliance with exercise, home and travel O2 prescription;To learn and understand importance of monitoring SPO2 with pulse oximeter and demonstrate accurate use of the pulse oximeter.;To learn and understand importance of maintaining oxygen saturations>88%;To learn and demonstrate proper pursed lip breathing techniques or other breathing techniques.    Long  Term Goals  Exhibits compliance with exercise, home and travel O2 prescription;Maintenance of O2 saturations>88%;Compliance with respiratory medication;Verbalizes importance of monitoring SPO2 with pulse oximeter and return demonstration;Exhibits proper breathing techniques, such as pursed lip breathing or other method taught during program session       Oxygen Re-Evaluation: Oxygen Re-Evaluation    Row Name 05/16/17 1127 06/13/17 1115 07/07/17 1023 07/08/17 1613 07/28/17 1738     Program Oxygen Prescription   Program Oxygen Prescription   Continuous;E-Tanks  Continuous;E-Tanks  Continuous;E-Tanks  -  Continuous;E-Tanks   Liters per minute  10  10  10   -  10   Comments  he has had to increase to 10 lpm while walking on the treadmill to maintain oxygen saturations greater than 88  -  has progressed to needing 10 lpm with all exercises  had to progress to 15 lpm with treadmill walking at most recent exercise session for desaturation to 86% on 10 liters  had to progress to 15 lpm with treadmill walking at most recent exercise session for desaturation to 86% on 10 liters     Home Oxygen   Home Oxygen Device  Home Concentrator;E-Tanks;Portable Concentrator  Home Concentrator;E-Tanks;Portable Concentrator  Home Concentrator;E-Tanks;Portable Concentrator  -  Home Concentrator;E-Tanks;Portable Concentrator   Sleep Oxygen Prescription  CPAP currently working physician to identify possible need for overnight oximetry to determine need for nighttime oxygen bleedin to cpap  CPAP  CPAP  -  CPAP   Liters per minute  -  -  4  -  4   Home Exercise Oxygen Prescription  Continuous working to get new home  concentrator that will accomodate 10lpm continuous flow  Continuous order placed this week for DME to replace patients home concentrator with one that will accomodate 10lpm  Continuous  -  Continuous   Liters per minute  10 while walking on his treadmill  10  10  -  10   Home at Rest Exercise Oxygen Prescription  None  Continuous  Continuous  -  Continuous   Liters per minute  -  2  4  -  4   Compliance with Home Oxygen Use  Yes  Yes  Yes  -  Yes     Goals/Expected Outcomes   Short Term Goals  To learn and exhibit compliance with exercise, home and travel O2 prescription;To learn and understand importance of monitoring SPO2 with pulse oximeter and demonstrate accurate use of the pulse oximeter.;To learn and understand importance of maintaining oxygen saturations>88%;To learn and demonstrate proper pursed lip breathing techniques or other breathing  techniques.  To learn and exhibit compliance with exercise, home and travel O2 prescription;To learn and understand importance of monitoring SPO2 with pulse oximeter and demonstrate accurate use of the pulse oximeter.;To learn and understand importance of maintaining oxygen saturations>88%;To learn and demonstrate proper pursed lip breathing techniques or other breathing techniques.  To learn and exhibit compliance with exercise, home and travel O2 prescription;To learn and understand importance of monitoring SPO2 with pulse oximeter and demonstrate accurate use of the pulse oximeter.;To learn and understand importance of maintaining oxygen saturations>88%;To learn and demonstrate proper pursed lip breathing techniques or other breathing techniques.  -  To learn and exhibit compliance with exercise, home and travel O2 prescription;To learn and understand importance of monitoring SPO2 with pulse oximeter and demonstrate accurate use of the pulse oximeter.;To learn and understand importance of maintaining oxygen saturations>88%;To learn and demonstrate proper pursed lip breathing techniques or other breathing techniques.   Long  Term Goals  Exhibits compliance with exercise, home and travel O2 prescription;Maintenance of O2 saturations>88%;Compliance with respiratory medication;Verbalizes importance of monitoring SPO2 with pulse oximeter and return demonstration;Exhibits proper breathing techniques, such as pursed lip breathing or other method taught during program session  Exhibits compliance with exercise, home and travel O2 prescription;Maintenance of O2 saturations>88%;Compliance with respiratory medication;Verbalizes importance of monitoring SPO2 with pulse oximeter and return demonstration;Exhibits proper breathing techniques, such as pursed lip breathing or other method taught during program session  Exhibits compliance with exercise, home and travel O2 prescription;Maintenance of O2  saturations>88%;Compliance with respiratory medication;Verbalizes importance of monitoring SPO2 with pulse oximeter and return demonstration;Exhibits proper breathing techniques, such as pursed lip breathing or other method taught during program session  -  Exhibits compliance with exercise, home and travel O2 prescription;Maintenance of O2 saturations>88%;Compliance with respiratory medication;Verbalizes importance of monitoring SPO2 with pulse oximeter and return demonstration;Exhibits proper breathing techniques, such as pursed lip breathing or other method taught during program session   Comments  patient verbalizes compliance with home o2 however barriers to correct use have been identified. Patient does not have adequate oxygen systems at home to maintain oxygen during sleep and with exercise  barriers have been addressed this week by pulmonologist and orders are in place for new high flow home concentrator and o2 bleed in with CPAP  patient has recieved new high flow home concentrator and oxygen bleedin for his CPAP  -  patient has adequate home oxygen to maintain healthy active social lifestyle   Goals/Expected Outcomes  patient will have adequate home oxygen delivery systems to accomodate life needs.  patient will have adequate home oxygen delivery systems to accomodate life needs.  -  -  Goal met   [place] Name 08/21/17 1610 09/15/17 1559           Program Oxygen Prescription   Program Oxygen Prescription  Continuous;E-Tanks  Continuous;E-Tanks      Liters per minute  10  15      Comments  had to progress to 15 lpm with treadmill walking at most recent exercise session for desaturation to 86% on 10 liters  had to progress to 15 lpm with treadmill walking at most recent exercise session for desaturation to 86% on 10 liters        Home Oxygen   Home Oxygen Device  Home Concentrator;E-Tanks;Portable Concentrator  Home Concentrator;E-Tanks;Portable Concentrator      Sleep Oxygen Prescription  CPAP   CPAP      Liters per minute  4  4      Home Exercise Oxygen Prescription  -  Continuous      Liters per minute  10  10 home concentrator only goes to 10lpm-patient requires 15      Home at Rest Exercise Oxygen Prescription  Continuous  Continuous      Liters per minute  4  4      Compliance with Home Oxygen Use  Yes  Yes        Goals/Expected Outcomes   Short Term Goals  To learn and exhibit compliance with exercise, home and travel O2 prescription;To learn and understand importance of monitoring SPO2 with pulse oximeter and demonstrate accurate use of the pulse oximeter.;To learn and understand importance of maintaining oxygen saturations>88%;To learn and demonstrate proper pursed lip breathing techniques or other breathing techniques.  To learn and exhibit compliance with exercise, home and travel O2 prescription;To learn and understand importance of monitoring SPO2 with pulse oximeter and demonstrate accurate use of the pulse oximeter.;To learn and understand importance of maintaining oxygen saturations>88%;To learn and demonstrate proper pursed lip breathing techniques or other breathing techniques.      Long  Term Goals  Exhibits compliance with exercise, home and travel O2 prescription;Maintenance of O2 saturations>88%;Compliance with respiratory medication;Verbalizes importance of monitoring SPO2 with pulse oximeter and return demonstration;Exhibits proper breathing techniques, such as pursed lip breathing or other method taught during program session  Exhibits compliance with exercise, home and travel O2 prescription;Maintenance of O2 saturations>88%;Compliance with respiratory medication;Verbalizes importance of monitoring SPO2 with pulse oximeter and return demonstration;Exhibits proper breathing techniques, such as pursed lip breathing or other method taught during program session      Comments  patient has adequate home oxygen to maintain healthy active social lifestyle  -       Goals/Expected Outcomes  Goal met  Goal met         Oxygen Discharge (Final Oxygen Re-Evaluation): Oxygen Re-Evaluation - 09/15/17 1559      Program Oxygen Prescription   Program Oxygen Prescription  Continuous;E-Tanks    Liters per minute  15    Comments  had to progress to 15 lpm with treadmill walking at most recent exercise session for desaturation to 86% on 10 liters      Home Oxygen   Home Oxygen Device  Home Concentrator;E-Tanks;Portable Concentrator    Sleep Oxygen Prescription  CPAP    Liters per minute  4    Home Exercise Oxygen Prescription  Continuous    Liters per minute  10 home concentrator only goes to 10lpm-patient requires 15  Home at Rest Exercise Oxygen Prescription  Continuous    Liters per minute  4    Compliance with Home Oxygen Use  Yes      Goals/Expected Outcomes   Short Term Goals  To learn and exhibit compliance with exercise, home and travel O2 prescription;To learn and understand importance of monitoring SPO2 with pulse oximeter and demonstrate accurate use of the pulse oximeter.;To learn and understand importance of maintaining oxygen saturations>88%;To learn and demonstrate proper pursed lip breathing techniques or other breathing techniques.    Long  Term Goals  Exhibits compliance with exercise, home and travel O2 prescription;Maintenance of O2 saturations>88%;Compliance with respiratory medication;Verbalizes importance of monitoring SPO2 with pulse oximeter and return demonstration;Exhibits proper breathing techniques, such as pursed lip breathing or other method taught during program session    Goals/Expected Outcomes  Goal met       Initial Exercise Prescription: Initial Exercise Prescription - 04/29/17 1600      Date of Initial Exercise RX and Referring Provider   Date  04/29/17    Referring Provider  Dr. [name]      Oxygen   Oxygen  Continuous    Liters  8      Treadmill   MPH  1.8    Grade  0    Minutes  17      Bike   Level   1    Minutes  17      NuStep   Level  5    Minutes  17    METs  2      Prescription Details   Frequency (times per week)  2    Duration  Progress to 45 minutes of aerobic exercise without signs/symptoms of physical distress      Intensity   THRR 40-80% of Max Heartrate  58-115    Ratings of Perceived Exertion  11-13    Perceived Dyspnea  0-4      Progression   Progression  Continue progressive overload as per policy without signs/symptoms or physical distress.      Resistance Training   Training Prescription  Yes    Weight  Blue bands    Reps  10-15       Perform Capillary Blood Glucose checks as needed.  Exercise Prescription Changes: Exercise Prescription Changes    Row Name 05/06/17 1200 05/13/17 1600 05/15/17 1200 05/20/17 1229 05/22/17 1200     Response to Exercise   Blood Pressure (Admit)  122/80  130/74  118/68  120/74  120/70   Blood Pressure (Exercise)  140/90  150/72  142/82  132/88  156/82   Blood Pressure (Exit)  118/72  110/64  118/60  104/72  114/70   Heart Rate (Admit)  84 bpm  78 bpm  80 bpm  81 bpm  89 bpm   Heart Rate (Exercise)  99 bpm  96 bpm  88 bpm  99 bpm  104 bpm   Heart Rate (Exit)  80 bpm  87 bpm  78 bpm  83 bpm  81 bpm   Oxygen Saturation (Admit)  93 %  92 %  92 %  90 %  91 %   Oxygen Saturation (Exercise)  86 %  86 %  88 %  86 %  87 %   Oxygen Saturation (Exit)  95 %  91 %  99 %  99 %  98 %   Rating of Perceived Exertion (Exercise)  11  13  9   11  13   Perceived Dyspnea (Exercise)  0  2  0  1  1   Duration  Continue with 45 min of aerobic exercise without signs/symptoms of physical distress.  Continue with 45 min of aerobic exercise without signs/symptoms of physical distress.  Continue with 45 min of aerobic exercise without signs/symptoms of physical distress.  Continue with 45 min of aerobic exercise without signs/symptoms of physical distress.  Continue with 45 min of aerobic exercise without signs/symptoms of physical distress.    Intensity  THRR unchanged  THRR unchanged  THRR unchanged  THRR unchanged  THRR unchanged     Progression   Progression  Continue to progress workloads to maintain intensity without signs/symptoms of physical distress.  Continue to progress workloads to maintain intensity without signs/symptoms of physical distress.  Continue to progress workloads to maintain intensity without signs/symptoms of physical distress.  Continue to progress workloads to maintain intensity without signs/symptoms of physical distress.  Continue to progress workloads to maintain intensity without signs/symptoms of physical distress.     Resistance Training   Training Prescription  Yes  Yes  Yes  Yes  Yes   Weight  blue bands  blue bands  blue bands  blue bands  blue bands   Reps  10-15  10-15  10-15  10-15  10-15   Time  10 Minutes  10 Minutes  10 Minutes  10 Minutes  10 Minutes     Interval Training   Interval Training  No  No  No  No  No     Oxygen   Oxygen  Continuous  Continuous  Continuous  Continuous  Continuous   Liters  8  8  8  10  10      Treadmill   MPH  1.8  1.8  1.8  1.8  1.8   Grade  0  0  0  0  0   Minutes  17  17  17  17  17      Bike   Level  -  1  -  1  -   Minutes  -  17  -  17  -     NuStep   Level  5  3  5  5  5    Minutes  17  17  17  17  17    METs  2.1  2.1  2.1  2.4  2.3   Row Name 05/27/17 1200 06/03/17 1200 06/19/17 1200 07/03/17 1300 07/08/17 1200     Response to Exercise   Blood Pressure (Admit)  140/70  146/80  138/80  132/70  110/76   Blood Pressure (Exercise)  132/66  118/70  144/80  118/80  126/70   Blood Pressure (Exit)  118/68  120/80  122/80  112/62  108/76   Heart Rate (Admit)  88 bpm  99 bpm  77 bpm  83 bpm  84 bpm   Heart Rate (Exercise)  100 bpm  112 bpm  102 bpm  99 bpm  107 bpm   Heart Rate (Exit)  80 bpm  91 bpm  80 bpm  86 bpm  98 bpm   Oxygen Saturation (Admit)  98 %  99 %  95 %  96 %  96 %   Oxygen Saturation (Exercise)  88 %  87 %  84 %  92 %  86 % oxygen  increased to 15L sat improved to 90%   Oxygen Saturation (Exit)  99 %  98 %  99 %  98 %  98 %   Rating of Perceived Exertion (Exercise)  11  11  13  10  9    Perceived Dyspnea (Exercise)  1  1  1   0  1   Duration  Continue with 45 min of aerobic exercise without signs/symptoms of physical distress.  Continue with 45 min of aerobic exercise without signs/symptoms of physical distress.  Continue with 45 min of aerobic exercise without signs/symptoms of physical distress.  Continue with 45 min of aerobic exercise without signs/symptoms of physical distress.  Continue with 45 min of aerobic exercise without signs/symptoms of physical distress.   Intensity  THRR unchanged  THRR unchanged  THRR unchanged  THRR unchanged  THRR unchanged     Progression   Progression  Continue to progress workloads to maintain intensity without signs/symptoms of physical distress.  Continue to progress workloads to maintain intensity without signs/symptoms of physical distress.  Continue to progress workloads to maintain intensity without signs/symptoms of physical distress.  Continue to progress workloads to maintain intensity without signs/symptoms of physical distress.  Continue to progress workloads to maintain intensity without signs/symptoms of physical distress.     Resistance Training   Training Prescription  Yes  Yes  Yes  Yes  Yes   Weight  blue bands  blue bands  blue bands  blue bands  blue bands   Reps  10-15  10-15  10-15  10-15  10-15   Time  10 Minutes  10 Minutes  10 Minutes  10 Minutes  10 Minutes     Interval Training   Interval Training  No  No  No  No  No     Oxygen   Oxygen  Continuous  Continuous  Continuous  Continuous  Continuous   Liters  10  10  10  10   10-15     Treadmill   MPH  1.8  1.8  1.8  -  1.8   Grade  0  0  0  -  0   Minutes  17  17  17   -  17     Bike   Level  1  -  -  1  1   Minutes  17  -  -  17  17     NuStep   Level  3 workload decreased not feeling well today  5  5  5   5    Minutes  17  17  17  17  17    METs  2.3  2.4  2.4  2.3  2     Home Exercise Plan   Plans to continue exercise at  -  -  -  -  Home (comment)   Frequency  -  -  -  -  Add 3 additional days to program exercise sessions.   Row Name 07/22/17 1200 07/24/17 1300 07/29/17 1200 08/14/17 1300 08/28/17 1300     Response to Exercise   Blood Pressure (Admit)  150/80  140/90  130/90  159/97  124/80   Blood Pressure (Exercise)  140/84  144/56  130/70  130/80  -   Blood Pressure (Exit)  100/72  112/74  116/70  118/82  122/68   Heart Rate (Admit)  90 bpm  83 bpm  90 bpm  68 bpm  94 bpm   Heart Rate (Exercise)  107 bpm  93 bpm  109 bpm  101 bpm  108 bpm   Heart Rate (  Exit)  90 bpm  82 bpm  99 bpm  75 bpm  90 bpm   Oxygen Saturation (Admit)  97 %  94 %  97 %  99 %  96 %   Oxygen Saturation (Exercise)  87 % oxygen increased to 15L sat improved to 90%  83 % oxygen increased to 15L sat improved to 90%  92 %  81 %  85 %   Oxygen Saturation (Exit)  90 %  93 %  98 %  98 %  99 %   Rating of Perceived Exertion (Exercise)  11  11  11  11  11    Perceived Dyspnea (Exercise)  1  3  1  1  1    Duration  Continue with 45 min of aerobic exercise without signs/symptoms of physical distress.  Continue with 45 min of aerobic exercise without signs/symptoms of physical distress.  Continue with 45 min of aerobic exercise without signs/symptoms of physical distress.  Continue with 45 min of aerobic exercise without signs/symptoms of physical distress.  Continue with 45 min of aerobic exercise without signs/symptoms of physical distress.   Intensity  THRR unchanged  THRR unchanged  THRR unchanged  THRR unchanged  THRR unchanged     Progression   Progression  Continue to progress workloads to maintain intensity without signs/symptoms of physical distress.  Continue to progress workloads to maintain intensity without signs/symptoms of physical distress.  Continue to progress workloads to maintain intensity without signs/symptoms  of physical distress.  Continue to progress workloads to maintain intensity without signs/symptoms of physical distress.  Continue to progress workloads to maintain intensity without signs/symptoms of physical distress.     Resistance Training   Training Prescription  Yes  Yes  Yes  Yes  Yes   Weight  blue bands  blue bands  blue bands  blue bands  blue bands   Reps  10-15  10-15  10-15  10-15  10-15   Time  10 Minutes  10 Minutes  10 Minutes  10 Minutes  10 Minutes     Interval Training   Interval Training  No  No  No  No  No     Oxygen   Oxygen  Continuous  Continuous  Continuous  Continuous  Continuous   Liters  10-15  10-15  10-15  10-15  10-15     Treadmill   MPH  1.8  1.9  1.8  1.9  1.9   Grade  0  1  0  0  0   Minutes  17  17  17  30   -     Bike   Level  1  0.5  1  -  -   Minutes  17  17  17   -  -     NuStep   Level  6  -  6  -  6   Minutes  17  -  17  -  17   METs  2.5  -  2  -  2.1   Row Name 09/02/17 1200 09/04/17 1200 09/16/17 1200 09/23/17 1200 09/25/17 0959     Response to Exercise   Blood Pressure (Admit)  124/70  130/80  134/70  124/60  140/90   Blood Pressure (Exercise)  130/68  130/80  110/66  142/72  144/86   Blood Pressure (Exit)  110/64  130/88  104/64  124/60  112/60   Heart Rate (Admit)  89 bpm  96 bpm  90 bpm  96 bpm  76 bpm   Heart Rate (Exercise)  109 bpm  102 bpm  115 bpm  105 bpm  128 bpm   Heart Rate (Exit)  86 bpm  98 bpm  99 bpm  82 bpm  76 bpm   Oxygen Saturation (Admit)  97 %  98 %  98 %  94 %  98 %   Oxygen Saturation (Exercise)  90 %  91 %  88 %  86 %  93 %   Oxygen Saturation (Exit)  96 %  94 %  90 %  98 %  98 %   Rating of Perceived Exertion (Exercise)  13  11  11  11  9    Perceived Dyspnea (Exercise)  2  0  1  1  0   Duration  Continue with 45 min of aerobic exercise without signs/symptoms of physical distress.  Continue with 45 min of aerobic exercise without signs/symptoms of physical distress.  Continue with 45 min of aerobic exercise  without signs/symptoms of physical distress.  Continue with 45 min of aerobic exercise without signs/symptoms of physical distress.  Continue with 45 min of aerobic exercise without signs/symptoms of physical distress.   Intensity  THRR unchanged  THRR unchanged  THRR unchanged  THRR unchanged  THRR unchanged     Progression   Progression  Continue to progress workloads to maintain intensity without signs/symptoms of physical distress.  Continue to progress workloads to maintain intensity without signs/symptoms of physical distress.  Continue to progress workloads to maintain intensity without signs/symptoms of physical distress.  Continue to progress workloads to maintain intensity without signs/symptoms of physical distress.  Continue to progress workloads to maintain intensity without signs/symptoms of physical distress.     Resistance Training   Training Prescription  Yes  Yes  Yes  Yes  Yes   Weight  blue bands  blue bands  blue bands  blue bands  blue bands   Reps  10-15  10-15  10-15  10-15  10-15   Time  10 Minutes  10 Minutes  10 Minutes  10 Minutes  10 Minutes     Interval Training   Interval Training  No  No  No  No  No     Oxygen   Oxygen  Continuous  Continuous  Continuous  Continuous  Continuous   Liters  10-15  10-15  10-15  10-15  10-15     Treadmill   MPH  1.8  1.8  1.8  1.8  -   Grade  0  0  0  0  -   Minutes  17  17  17  17   -     Bike   Level  -  -  1  1  1    Minutes  -  -  17  17  17      NuStep   Level  6  6  6  6  6    Minutes  17  17  17  17  17    METs  1.8  2  1.7  2.5  1.9      Exercise Comments: Exercise Comments    Row Name 07/10/17 0745           Exercise Comments  Home exercise completed          Exercise Goals and Review: Exercise Goals    Row Name 04/25/17 [phone]  Exercise Goals   Increase Physical Activity  Yes       Intervention  Provide advice, education, support and counseling about physical activity/exercise  needs.;Develop an individualized exercise prescription for aerobic and resistive training based on initial evaluation findings, risk stratification, comorbidities and participant's personal goals.       Expected Outcomes  Achievement of increased cardiorespiratory fitness and enhanced flexibility, muscular endurance and strength shown through measurements of functional capacity and personal statement of participant.       Increase Strength and Stamina  Yes       Intervention  Provide advice, education, support and counseling about physical activity/exercise needs.;Develop an individualized exercise prescription for aerobic and resistive training based on initial evaluation findings, risk stratification, comorbidities and participant's personal goals.       Expected Outcomes  Achievement of increased cardiorespiratory fitness and enhanced flexibility, muscular endurance and strength shown through measurements of functional capacity and personal statement of participant.       Able to understand and use rate of perceived exertion (RPE) scale  Yes       Intervention  Provide education and explanation on how to use RPE scale       Expected Outcomes  Short Term: Able to use RPE daily in rehab to express subjective intensity level;Long Term:  Able to use RPE to guide intensity level when exercising independently       Able to understand and use Dyspnea scale  Yes       Intervention  Provide education and explanation on how to use Dyspnea scale       Expected Outcomes  Short Term: Able to use Dyspnea scale daily in rehab to express subjective sense of shortness of breath during exertion;Long Term: Able to use Dyspnea scale to guide intensity level when exercising independently       Knowledge and understanding of Target Heart Rate Range (THRR)  Yes       Intervention  Provide education and explanation of THRR including how the numbers were predicted and where they are located for reference       Expected  Outcomes  Short Term: Able to state/look up THRR;Long Term: Able to use THRR to govern intensity when exercising independently;Short Term: Able to use daily as guideline for intensity in rehab       Understanding of Exercise Prescription  Yes       Intervention  Provide education, explanation, and written materials on patient's individual exercise prescription       Expected Outcomes  Short Term: Able to explain program exercise prescription;Long Term: Able to explain home exercise prescription to exercise independently          Exercise Goals Re-Evaluation : Exercise Goals Re-Evaluation    Row Name 05/12/17 1335 06/14/17 1406 07/10/17 1615 07/28/17 1635 08/14/17 0737     Exercise Goal Re-Evaluation   Exercise Goals Review  Increase Strength and Stamina;Able to understand and use Dyspnea scale;Able to understand and use rate of perceived exertion (RPE) scale;Increase Physical Activity;Knowledge and understanding of Target Heart Rate Range (THRR);Understanding of Exercise Prescription  Increase Physical Activity;Increase Strength and Stamina;Able to understand and use Dyspnea scale;Able to understand and use rate of perceived exertion (RPE) scale;Knowledge and understanding of Target Heart Rate Range (THRR);Understanding of Exercise Prescription  Increase Physical Activity;Increase Strength and Stamina;Able to understand and use Dyspnea scale;Able to understand and use rate of perceived exertion (RPE) scale;Knowledge and understanding of Target Heart Rate Range (THRR);Understanding of Exercise Prescription  Increase  Physical Activity;Increase Strength and Stamina;Able to understand and use Dyspnea scale;Able to understand and use rate of perceived exertion (RPE) scale;Knowledge and understanding of Target Heart Rate Range (THRR);Understanding of Exercise Prescription  Increase Strength and Stamina;Increase Physical Activity;Able to understand and use Dyspnea scale;Able to understand and use rate of  perceived exertion (RPE) scale;Knowledge and understanding of Target Heart Rate Range (THRR);Understanding of Exercise Prescription   Comments  Patient has only attended one exercise session. Will cont. to monitor and progress.   Patient is struggling with exercise progression due to disease progression. METs range in the "low" level. Patient has purchased a treadmill and is going to start exercising at home. Will cont. to monitor and progress as able.   Patient is struggling with exercise progression due to disease progression. METs range in the "low" level. Has treadmill at home and has started his home exercise routine. Will cont. to monitor and progress as able.   Patient is struggling with exercise progression due to disease progression. METs range in the "low" level. Has treadmill at home and has started his home exercise routine. Will cont. to monitor and progress as able.   Patient is struggling with exercise progression due to disease progression. METs range in the "low" level. Has treadmill at home and has started his home exercise routine. Will cont. to monitor and progress as able.    Expected Outcomes  Through the exercise here at rehab the patient with increase physical capacity, strength, and stamina.  Through the exercise here at rehab the patient with increase physical capacity, strength, and stamina.  Through exercise at rehab and at home, patient will increase strength and stamina making ADL's easier to perform. Patient will also have a better understanding of safe exercise and what they are capable to do outside of clinical supervision.  Through exercise at rehab and at home, patient will increase strength and stamina making ADL's easier to perform. Patient will also have a better understanding of safe exercise and what they are capable to do outside of clinical supervision.  Through exercise at rehab and at home, patient will increase strength and stamina making ADL's easier to perform.  Patient will also have a better understanding of safe exercise and what they are capable to do outside of clinical supervision.   [place] Name 09/15/17 1602 10/02/17 1619           Exercise Goal Re-Evaluation   Exercise Goals Review  Increase Strength and Stamina;Increase Physical Activity;Able to understand and use Dyspnea scale;Able to understand and use rate of perceived exertion (RPE) scale;Knowledge and understanding of Target Heart Rate Range (THRR);Understanding of Exercise Prescription  Increase Strength and Stamina;Increase Physical Activity;Able to understand and use Dyspnea scale;Able to understand and use rate of perceived exertion (RPE) scale;Knowledge and understanding of Target Heart Rate Range (THRR);Understanding of Exercise Prescription      Comments  Patient is struggling with exercise progression due to disease progression. METs range in the "low" level. Has treadmill at home and has started his home exercise routine. Will cont. to monitor and progress as able.   Patient is struggling with exercise progression due to disease progression. METs range in the "low" level. Has treadmill at home and has started his home exercise routine. Will cont. to monitor and progress as able.       Expected Outcomes  Through exercise at rehab and at home, patient will increase strength and stamina making ADL's easier to perform. Patient will also have a better understanding of  safe exercise and what they are capable to do outside of clinical supervision.  Through exercise at rehab and at home, patient will increase strength and stamina making ADL's easier to perform. Patient will also have a better understanding of safe exercise and what they are capable to do outside of clinical supervision.         Discharge Exercise Prescription (Final Exercise Prescription Changes): Exercise Prescription Changes - 09/25/17 0959      Response to Exercise   Blood Pressure (Admit)  140/90    Blood Pressure  (Exercise)  144/86    Blood Pressure (Exit)  112/60    Heart Rate (Admit)  76 bpm    Heart Rate (Exercise)  128 bpm    Heart Rate (Exit)  76 bpm    Oxygen Saturation (Admit)  98 %    Oxygen Saturation (Exercise)  93 %    Oxygen Saturation (Exit)  98 %    Rating of Perceived Exertion (Exercise)  9    Perceived Dyspnea (Exercise)  0    Duration  Continue with 45 min of aerobic exercise without signs/symptoms of physical distress.    Intensity  THRR unchanged      Progression   Progression  Continue to progress workloads to maintain intensity without signs/symptoms of physical distress.      Resistance Training   Training Prescription  Yes    Weight  blue bands    Reps  10-15    Time  10 Minutes      Interval Training   Interval Training  No      Oxygen   Oxygen  Continuous    Liters  10-15      Bike   Level  1    Minutes  17      NuStep   Level  6    Minutes  17    METs  1.9       Nutrition:  Target Goals: Understanding of nutrition guidelines, daily intake of sodium <1560m, cholesterol <2094m calories 30% from fat and 7% or less from saturated fats, daily to have 5 or more servings of fruits and vegetables.  Biometrics:    Nutrition Therapy Plan and Nutrition Goals: Nutrition Therapy & Goals - 06/19/17 1323      Nutrition Therapy   Diet  Therapeutic Lifestyle Changes      Personal Nutrition Goals   Nutrition Goal  Wt loss of 1-2 lb/week to a wt loss goal of 6-24 lb at graduation from Pulmonary Rehab    Personal Goal #2  Pt to increase fish consumption to at least 1-2 times/week    Personal Goal #3  Pt to increase consumption of fruits and vegetables from 0-1 cup per day to 2-3 cups per day.    Personal Goal #4  Pt to use nuts/peanut butter as a small snack or protein choice 2 times/week or more.      Intervention Plan   Intervention  Prescribe, educate and counsel regarding individualized specific dietary modifications aiming towards targeted core  components such as weight, hypertension, lipid management, diabetes, heart failure and other comorbidities.    Expected Outcomes  Short Term Goal: Understand basic principles of dietary content, such as calories, fat, sodium, cholesterol and nutrients.;Long Term Goal: Adherence to prescribed nutrition plan.       Nutrition Assessments: Nutrition Assessments - 06/19/17 1325      Rate Your Plate Scores   Pre Score  53  Nutrition Goals Re-Evaluation: Nutrition Goals Re-Evaluation    Row Name 05/12/17 1028             Goals   Current Weight  210 lb 5.1 oz (95.4 kg)       Nutrition Goal  Wt loss of 1-2 lb/week to a wt loss goal of 6-24 lb at graduation from Pulmonary Rehab       Comment  Pt has maintained his wt while in rehab. Wt loss goal wt not achieved. Pt has been limiting food sources of sodium and now usually compares and chooses lower sodium food options.          Personal Goal #2 Re-Evaluation   Personal Goal #2  Identify and limit food sources of sodium          Nutrition Goals Discharge (Final Nutrition Goals Re-Evaluation): Nutrition Goals Re-Evaluation - 05/12/17 1028      Goals   Current Weight  210 lb 5.1 oz (95.4 kg)    Nutrition Goal  Wt loss of 1-2 lb/week to a wt loss goal of 6-24 lb at graduation from Pulmonary Rehab    Comment  Pt has maintained his wt while in rehab. Wt loss goal wt not achieved. Pt has been limiting food sources of sodium and now usually compares and chooses lower sodium food options.       Personal Goal #2 Re-Evaluation   Personal Goal #2  Identify and limit food sources of sodium       Psychosocial: Target Goals: Acknowledge presence or absence of significant depression and/or stress, maximize coping skills, provide positive support system. Participant is able to verbalize types and ability to use techniques and skills needed for reducing stress and depression.  Initial Review & Psychosocial Screening: Initial Psych Review &  Screening - 04/25/17 1547      Initial Review   Current issues with  None Identified    Source of Stress Concerns  None Identified      Family Dynamics   Good Support System?  Yes      Barriers   Psychosocial barriers to participate in program  There are no identifiable barriers or psychosocial needs.       Quality of Life Scores:  Scores of 19 and below usually indicate a poorer quality of life in these areas.  A difference of  2-3 points is a clinically meaningful difference.  A difference of 2-3 points in the total score of the Quality of Life Index has been associated with significant improvement in overall quality of life, self-image, physical symptoms, and general health in studies assessing change in quality of life.   PHQ-9: Recent Review Flowsheet Data    Depression screen [facility] 2/9 04/25/2017 03/04/2017 11/15/2016   Decreased Interest 0 0 0   Down, Depressed, Hopeless 0 0 0   PHQ - 2 Score 0 0 0     Interpretation of Total Score  Total Score Depression Severity:  1-4 = Minimal depression, 5-9 = Mild depression, 10-14 = Moderate depression, 15-19 = Moderately severe depression, 20-27 = Severe depression   Psychosocial Evaluation and Intervention: Psychosocial Evaluation - 04/25/17 1548      Psychosocial Evaluation & Interventions   Interventions  Encouraged to exercise with the program and follow exercise prescription    Continue Psychosocial Services   No Follow up required       Psychosocial Re-Evaluation: Psychosocial Re-Evaluation    [place] Name 05/16/17 1134 06/13/17 1132 07/07/17 1037 07/28/17 1742 08/21/17 7035  Psychosocial Re-Evaluation   Current issues with  Current Sleep Concerns  Current Sleep Concerns;Current Depression;History of Depression;Current Stress Concerns  Current Sleep Concerns;Current Depression;History of Depression;Current Stress Concerns  Current Sleep Concerns;Current Depression;History of Depression;Current Stress Concerns  Current Sleep  Concerns;Current Depression;History of Depression;Current Stress Concerns   Comments  patient has a habit of waking at the same time every night and migrating to the kitchen and eating sweets. he has had behavior modification therapy in the past with success. he is incoraged to revisit need for therapy. this is keeping patient from getting a full nights rest and causing him to continue to gain weight.  patient has been prescribed antidepressant this week by pulmonologist. Will address stress of illness, depression, and sleep with patient over then next 4-6 weeks as medication takes effect  patient states he is beginning to feel an improvement in his mood, probably as a result of the antidepressant he has been placed on. He also states he is beginning to sleep through the night with only 1 awakening since wearing his CPAP with oxygen.  patient continues to see small improvements in his mood and this is reflected through his social engagements in the class. He is sleeping through the nights more.  patient continues to see small improvements in his mood and this is reflected through his social engagements in the class. He is sleeping through the nights more.   Expected Outcomes  patient will verbalize behavoir modification stratagies to help with getting up and eating with nighttime awakening  patient will verbalize behavoir modification stratagies to help with getting up and eating with nighttime awakening and will verbalize he has more energy and enjoyment in everyday activities  patient beginning to use behavior modification stratagies that help with his eating sugary snacks with nighttime awakenings.  patient continues to use behavior modification stratagies that help with his eating sugary snacks with nighttime awakenings.  patient continues to use behavior modification stratagies that help with his eating sugary snacks with nighttime awakenings.   Interventions  Encouraged to attend Pulmonary Rehabilitation  for the exercise  Encouraged to attend Pulmonary Rehabilitation for the exercise  Encouraged to attend Pulmonary Rehabilitation for the exercise  Encouraged to attend Pulmonary Rehabilitation for the exercise  Encouraged to attend Pulmonary Rehabilitation for the exercise   Continue Psychosocial Services   Follow up required by staff  Follow up required by staff folllow up required by MD  Follow up required by staff  Follow up required by staff  Follow up required by staff   Comments  -  worsening of chronic illness  -  worsening of chronic illness  worsening of chronic illness     Initial Review   Source of Stress Concerns  None Identified  Chronic Illness  -  Chronic Illness  Chronic Illness   Row Name 09/15/17 1219 10/06/17 0802           Psychosocial Re-Evaluation   Current issues with  Current Sleep Concerns;Current Depression;History of Depression;Current Stress Concerns  Current Sleep Concerns;Current Depression;History of Depression;Current Stress Concerns      Comments  patient continues to see more improvements in his mood and this is reflected through his social engagements in the class. He is sleeping through the nights more.  patient referred to psychiatry for depression. awaiting appointment      Expected Outcomes  patient continues to use behavior modification stratagies that help with his eating sugary snacks with nighttime awakenings.  -  Interventions  Encouraged to attend Pulmonary Rehabilitation for the exercise  Physician referral      Continue Psychosocial Services   Follow up required by staff  -      Comments  worsening of chronic illness  -        Initial Review   Source of Stress Concerns  Chronic Illness  Chronic Illness;Unable to participate in former interests or hobbies;Unable to perform yard/household activities         Psychosocial Discharge (Final Psychosocial Re-Evaluation): Psychosocial Re-Evaluation - 10/06/17 0802      Psychosocial Re-Evaluation    Current issues with  Current Sleep Concerns;Current Depression;History of Depression;Current Stress Concerns    Comments  patient referred to psychiatry for depression. awaiting appointment    Interventions  Physician referral      Initial Review   Source of Stress Concerns  Chronic Illness;Unable to participate in former interests or hobbies;Unable to perform yard/household activities       Education: Education Goals: Education classes will be provided on a weekly basis, covering required topics. Participant will state understanding/return demonstration of topics presented.  Learning Barriers/Preferences:   Education Topics: Risk Factor Reduction:  -Group instruction that is supported by a PowerPoint presentation. Instructor discusses the definition of a risk factor, different risk factors for pulmonary disease, and how the heart and lungs work together.     Nutrition for Pulmonary Patient:  -Group instruction provided by PowerPoint slides, verbal discussion, and written materials to support subject matter. The instructor gives an explanation and review of healthy diet recommendations, which includes a discussion on weight management, recommendations for fruit and vegetable consumption, as well as protein, fluid, caffeine, fiber, sodium, sugar, and alcohol. Tips for eating when patients are short of breath are discussed.   PULMONARY REHAB OTHER RESPIRATORY from 10/02/2017 in [place]  Date  08/28/17  Educator  RD      Pursed Lip Breathing:  -Group instruction that is supported by demonstration and informational handouts. Instructor discusses the benefits of pursed lip and diaphragmatic breathing and detailed demonstration on how to preform both.     Oxygen Safety:  -Group instruction provided by PowerPoint, verbal discussion, and written material to support subject matter. There is an overview of "What is Oxygen" and "Why do we need it".  Instructor  also reviews how to create a safe environment for oxygen use, the importance of using oxygen as prescribed, and the risks of noncompliance. There is a brief discussion on traveling with oxygen and resources the patient may utilize.   PULMONARY REHAB OTHER RESPIRATORY from 10/02/2017 in [place]  Date  10/02/17  Educator  Truddie Crumble      Oxygen Equipment:  -Group instruction provided by [facility] Staff utilizing handouts, written materials, and equipment demonstrations.   PULMONARY REHAB OTHER RESPIRATORY from 10/02/2017 in [place]  Date  01/16/17  Educator  [employer]      Signs and Symptoms:  -Group instruction provided by written material and verbal discussion to support subject matter. Warning signs and symptoms of infection, stroke, and heart attack are reviewed and when to call the physician/911 reinforced. Tips for preventing the spread of infection discussed.   PULMONARY REHAB OTHER RESPIRATORY from 10/02/2017 in [place]  Date  05/15/17  Educator  rn      Advanced Directives:  -Group instruction provided by verbal instruction and written material to support subject matter.  Instructor reviews Advanced Directive laws and proper instruction for filling out document.   Pulmonary Video:  -Group video education that reviews the importance of medication and oxygen compliance, exercise, good nutrition, pulmonary hygiene, and pursed lip and diaphragmatic breathing for the pulmonary patient.   PULMONARY REHAB OTHER RESPIRATORY from 10/02/2017 in [place]  Date  05/22/17      Exercise for the Pulmonary Patient:  -Group instruction that is supported by a PowerPoint presentation. Instructor discusses benefits of exercise, core components of exercise, frequency, duration, and intensity of an exercise routine, importance of utilizing pulse oximetry during  exercise, safety while exercising, and options of places to exercise outside of rehab.     PULMONARY REHAB OTHER RESPIRATORY from 10/02/2017 in [place]  Date  05/06/17  Educator  ep      Pulmonary Medications:  -Verbally interactive group education provided by instructor with focus on inhaled medications and proper administration.   PULMONARY REHAB OTHER RESPIRATORY from 10/02/2017 in [place]  Date  01/09/17  Educator  Pharm      Anatomy and Physiology of the Respiratory System and Intimacy:  -Group instruction provided by PowerPoint, verbal discussion, and written material to support subject matter. Instructor reviews respiratory cycle and anatomical components of the respiratory system and their functions. Instructor also reviews differences in obstructive and restrictive respiratory diseases with examples of each. Intimacy, Sex, and Sexuality differences are reviewed with a discussion on how relationships can change when diagnosed with pulmonary disease. Common sexual concerns are reviewed.   MD DAY -A group question and answer session with a medical doctor that allows participants to ask questions that relate to their pulmonary disease state.   PULMONARY REHAB OTHER RESPIRATORY from 10/02/2017 in [place]  Date  09/04/17  Educator  yacoub      OTHER EDUCATION -Group or individual verbal, written, or video instructions that support the educational goals of the pulmonary rehab program.   PULMONARY REHAB OTHER RESPIRATORY from 10/02/2017 in [place]  Date  07/03/17 [Holiday Eating]  Educator  RD  Instruction Review Code  1- Verbalizes Understanding      Holiday Eating Survival Tips:  -Group instruction provided by PowerPoint slides, verbal discussion, and written materials to support subject matter. The instructor gives patients tips, tricks, and  techniques to help them not only survive but enjoy the holidays despite the onslaught of food that accompanies the holidays.   Knowledge Questionnaire Score:   Core Components/Risk Factors/Patient Goals at Admission: Personal Goals and Risk Factors at Admission - 04/25/17 1546      Core Components/Risk Factors/Patient Goals on Admission   Improve shortness of breath with ADL's  Yes    Intervention  Provide education, individualized exercise plan and daily activity instruction to help decrease symptoms of SOB with activities of daily living.    Expected Outcomes  Short Term: Achieves a reduction of symptoms when performing activities of daily living.    Hypertension  Yes    Intervention  Provide education on lifestyle modifcations including regular physical activity/exercise, weight management, moderate sodium restriction and increased consumption of fresh fruit, vegetables, and low fat dairy, alcohol moderation, and smoking cessation.;Monitor prescription use compliance.    Expected Outcomes  Short Term: Continued assessment and intervention until BP is < 140/9m HG in hypertensive participants. < 130/831mHG in hypertensive participants with diabetes, heart failure or chronic kidney  disease.;Long Term: Maintenance of blood pressure at goal levels.       Core Components/Risk Factors/Patient Goals Review:  Goals and Risk Factor Review    Row Name 05/16/17 1132 06/13/17 1120 07/07/17 1028 07/28/17 1739 08/21/17 0822     Core Components/Risk Factors/Patient Goals Review   Personal Goals Review  Develop more efficient breathing techniques such as purse lipped breathing and diaphragmatic breathing and practicing self-pacing with activity.;Hypertension;Stress;Weight Management/Obesity;Improve shortness of breath with ADL's;Increase knowledge of respiratory medications and ability to use respiratory devices properly.  Develop more efficient breathing techniques such as purse lipped breathing and  diaphragmatic breathing and practicing self-pacing with activity.;Hypertension;Stress;Weight Management/Obesity;Improve shortness of breath with ADL's;Increase knowledge of respiratory medications and ability to use respiratory devices properly.  Develop more efficient breathing techniques such as purse lipped breathing and diaphragmatic breathing and practicing self-pacing with activity.;Hypertension;Stress;Weight Management/Obesity;Improve shortness of breath with ADL's;Increase knowledge of respiratory medications and ability to use respiratory devices properly.  Develop more efficient breathing techniques such as purse lipped breathing and diaphragmatic breathing and practicing self-pacing with activity.;Hypertension;Stress;Weight Management/Obesity;Improve shortness of breath with ADL's;Increase knowledge of respiratory medications and ability to use respiratory devices properly.  Develop more efficient breathing techniques such as purse lipped breathing and diaphragmatic breathing and practicing self-pacing with activity.;Hypertension;Stress;Weight Management/Obesity;Improve shortness of breath with ADL's;Increase knowledge of respiratory medications and ability to use respiratory devices properly.   Review  this is patient second admission in pulmonary rehab. he admits to not following through with his discharge home exercise plan and by being sedentary for the last several months patient has become deconditioned. It is too early in this admission to evaluate progress towards current program and personal goals.  Patient is attempting to be more compliant with activity prescribed at home. He voices feeling "worse" on a daily basis and has seen his pulmonologist this week for a full work-up. His BP remains high at times, his SOB is worsening and his oxygen requirements are increasing. Will have a better idea of treatment plan goals once all test results are reviewed by md.  patient has accepted his need for  weekly exercise and is walking on his treadmill as prescribed. He is attempting to cut back on his "unhealthy" eating in order to loose weight. He verbalizes watching his sugar intake as it relates to inflammation. 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 His resting BP prior to exercise ranges from 120s-140s and during exertion 115-150s.  patient making very slow progression towards goals related to disease progression and deconditioning. He is to be commended on his weight loss of 7 lbs since admission. He has done this by limiting simple carbs such as sweet tea and drinking more water. He is also doing his best to limit nighttime snacking. His HTN has stabalized on current medication regimine and he seems to be less anxious and stressed over the progression of his disease. He continues to use PLB during exercise.  Patient continues to work as hard as he can at each session in pulmonary rehab. He no longer tolerates workload increases and has had a recent appointment at his pulmonologist where both agreed that his disease has progressed. He recently spoke with the RD about his lack of appitite. Encourged patient to be a physically active as possible and to take as many restbreaks as necessary. It may be time to discuss maintaining his physical abilitilies more than progression towards goals.   Expected Outcomes  see admission expected outcomes  see admission expected outcomes  see admission expected outcomes  see admission expected outcomes  see admission expected outcomes   Row Name 09/15/17 1213 10/06/17 0751           Core Components/Risk Factors/Patient Goals Review   Personal Goals Review  Develop more efficient breathing techniques such as purse lipped breathing and diaphragmatic breathing and practicing self-pacing with activity.;Hypertension;Stress;Weight Management/Obesity;Improve shortness of breath with ADL's;Increase knowledge of respiratory medications and ability to use  respiratory devices properly.  Develop more efficient breathing techniques such as purse lipped breathing and diaphragmatic breathing and practicing self-pacing with activity.;Hypertension;Stress;Weight Management/Obesity;Improve shortness of breath with ADL's;Increase knowledge of respiratory medications and ability to use respiratory devices properly.      Review  Patient continues to work as hard as he can at each session in pulmonary rehab. He no longer tolerates workload increases. He understands his disease has progressed however recent conversations with patient indicate that he is no where near ready to "give up". He wants to continue to come to pulmonary rehab and enjoys the interactions with staff and classmates. He recently spoke with the RD about his lack of appitite. She gave him suggestions to manage GI upset from [facility] and those suggestions are working.Encourged patient to be a physically active as possible and to take as many restbreaks as necessary. We have discuss maintaining his physical abilitilies more than progression. This is OK with him. He is limited to home exercise currently related to the need for higher flow oxygen.  Continues to work as hard as he can at each pulmonary rehab session. We are currently maintaining workloads and not increasing them. He recently had an appointment with his pulmonologist and verbalized his need for care for depression. He has been referred to psychiatry for medication management. His desires to continue to be active are appreciated and every feasible attempt will be made to help him achieve his desires. Will continue to monitor and progress as tolerated.      Expected Outcomes  see admission expected outcomes  see admission expected outcomes         Core Components/Risk Factors/Patient Goals at Discharge (Final Review):  Goals and Risk Factor Review - 10/06/17 0751      Core Components/Risk Factors/Patient Goals Review   Personal Goals Review   Develop more efficient breathing techniques such as purse lipped breathing and diaphragmatic breathing and practicing self-pacing with activity.;Hypertension;Stress;Weight Management/Obesity;Improve shortness of breath with ADL's;Increase knowledge of respiratory medications and ability to use respiratory devices properly.    Review  Continues to work as hard as he can at each pulmonary rehab session. We are currently maintaining workloads and not increasing them. He recently had an appointment with his pulmonologist and verbalized his need for care for depression. He has been referred to psychiatry for medication management. His desires to continue to be active are appreciated and every feasible attempt will be made to help him achieve his desires. Will continue to monitor and progress as tolerated.    Expected Outcomes  see admission expected outcomes       ITP Comments:   Comments: patient has attended 21 sessions since admission

## 2017-10-09 ENCOUNTER — Encounter (HOSPITAL_COMMUNITY)
Admission: RE | Admit: 2017-10-09 | Discharge: 2017-10-09 | Disposition: A | Discharge status: Home / Self Care | Payer: Medicare Other | Source: Ambulatory Visit | Attending: Pulmonary Disease | Admitting: Pulmonary Disease

## 2017-10-09 DIAGNOSIS — J849 Interstitial pulmonary disease, unspecified: Secondary | ICD-10-CM | POA: Diagnosis not present

## 2017-10-14 ENCOUNTER — Encounter (HOSPITAL_COMMUNITY): Payer: Medicare Other

## 2017-10-14 ENCOUNTER — Encounter (HOSPITAL_COMMUNITY): Payer: Self-pay

## 2017-10-14 DIAGNOSIS — J849 Interstitial pulmonary disease, unspecified: Secondary | ICD-10-CM

## 2017-10-14 NOTE — Progress Notes (Signed)
Discharge Progress Report  Patient Details  Name: Jeffrey Cobb MRN: [id number] Date of Birth: March 31, 1941 Referring Provider:     Pulmonary Rehab Walk Test from 04/29/2017 in [place]  Referring Provider  Dr. [name]       Number of Visits: 22  Reason for Discharge:  Patient reached a stable level of exercise. Patient independent in their exercise. Patient has met program and personal goals.  Smoking History:  Social History   Tobacco Use  Smoking Status Former Smoker  . Packs/day: 3.00  . Years: 17.00  . Pack years: 51.00  . Types: Cigarettes  . Last attempt to quit: 08/26/1977  . Years since quitting: 40.1  Smokeless Tobacco Never Used    Diagnosis:  ILD (interstitial lung disease) ([place])  ADL [facility]: Pulmonary Assessment Scores    Row Name 10/13/17 [phone] 10/13/17 0848       ADL [facility]   ADL Phase  Exit  Exit    SOB Score total  -  49      CAT Score   CAT Score  -  21      mMRC Score   mMRC Score  2  -       Initial Exercise Prescription:   Discharge Exercise Prescription (Final Exercise Prescription Changes): Exercise Prescription Changes - 09/25/17 0959      Response to Exercise   Blood Pressure (Admit)  140/90    Blood Pressure (Exercise)  144/86    Blood Pressure (Exit)  112/60    Heart Rate (Admit)  76 bpm    Heart Rate (Exercise)  128 bpm    Heart Rate (Exit)  76 bpm    Oxygen Saturation (Admit)  98 %    Oxygen Saturation (Exercise)  93 %    Oxygen Saturation (Exit)  98 %    Rating of Perceived Exertion (Exercise)  9    Perceived Dyspnea (Exercise)  0    Duration  Continue with 45 min of aerobic exercise without signs/symptoms of physical distress.    Intensity  THRR unchanged      Progression   Progression  Continue to progress workloads to maintain intensity without signs/symptoms of physical distress.      Resistance Training   Training Prescription  Yes    Weight  blue bands    Reps  10-15    Time   10 Minutes      Interval Training   Interval Training  No      Oxygen   Oxygen  Continuous    Liters  10-15      Bike   Level  1    Minutes  17      NuStep   Level  6    Minutes  17    METs  1.9       Functional Capacity: 6 Minute Walk    Row Name 10/13/17 0722 10/13/17 0740       6 Minute Walk   Phase  Discharge  (Pended)   Discharge    Distance  1100 feet  (Pended)   1100 feet    Distance Feet Change  200 ft  (Pended)   -200 ft    Walk Time  6 minutes  (Pended)   6 minutes    # of Rest Breaks  1  (Pended)  initiated by EP  1 Initiated by EP    MPH  2.08  (Pended)   2.08    METS  2.53  (Pended)   2.53    RPE  11  (Pended)   11    Perceived Dyspnea   1  (Pended)   1    Symptoms  No  (Pended)   No    Comments  used wheelchair  (Pended)   used wheelchair    Resting HR  99 bpm  (Pended)   99 bpm    Resting BP  150/86  (Pended)   150/86    Resting Oxygen Saturation   96 %  (Pended)   96 %    Exercise Oxygen Saturation  during 6 min walk  83 %  (Pended)   83 %    Max Ex. HR  113 bpm  (Pended)   113 bpm    Max Ex. BP  128/68  (Pended)   128/68      Interval HR   1 Minute HR  110  (Pended)   110    2 Minute HR  110  (Pended)   110    3 Minute HR  111  (Pended)   111    4 Minute HR  113  (Pended)   113    5 Minute HR  111  (Pended)   111    6 Minute HR  112  (Pended)   112    2 Minute Post HR  109  (Pended)   109    Interval Heart Rate?  Yes  (Pended)   Yes      Interval Oxygen   Interval Oxygen?  Yes  (Pended)   Yes    Baseline Oxygen Saturation %  96 %  (Pended)   96 %    1 Minute Oxygen Saturation %  89 %  (Pended)   89 %    1 Minute Liters of Oxygen  15 L  (Pended)   15 L    2 Minute Oxygen Saturation %  89 %  (Pended)   89 %    2 Minute Liters of Oxygen  15 L  (Pended)   15 L    3 Minute Oxygen Saturation %  83 %  (Pended)   83 %    3 Minute Liters of Oxygen  15 L  (Pended)   15 L    4 Minute Oxygen Saturation %  85 %  (Pended)   85 %    4 Minute Liters of  Oxygen  15 L  (Pended)   15 L    5 Minute Oxygen Saturation %  89 %  (Pended)   89 %    5 Minute Liters of Oxygen  15 L  (Pended)   15 L    6 Minute Oxygen Saturation %  83 %  (Pended)   83 %    6 Minute Liters of Oxygen  15 L  (Pended)   15 L    2 Minute Post Oxygen Saturation %  -  90 %    2 Minute Post Liters of Oxygen  -  15 L       Psychological, QOL, Others - Outcomes: PHQ 2/9: Depression screen [facility] 2/9 04/25/2017 03/04/2017 11/15/2016  Decreased Interest 0 0 0  Down, Depressed, Hopeless 0 0 0  PHQ - 2 Score 0 0 0    Quality of Life:   Personal Goals: Goals established at orientation with interventions provided to work toward goal.    Personal Goals Discharge: Goals and Risk Factor Review    Row Name  08/21/17 0822 09/15/17 1213 10/06/17 0751 10/14/17 0905       Core Components/Risk Factors/Patient Goals Review   Personal Goals Review  Develop more efficient breathing techniques such as purse lipped breathing and diaphragmatic breathing and practicing self-pacing with activity.;Hypertension;Stress;Weight Management/Obesity;Improve shortness of breath with ADL's;Increase knowledge of respiratory medications and ability to use respiratory devices properly.  Develop more efficient breathing techniques such as purse lipped breathing and diaphragmatic breathing and practicing self-pacing with activity.;Hypertension;Stress;Weight Management/Obesity;Improve shortness of breath with ADL's;Increase knowledge of respiratory medications and ability to use respiratory devices properly.  Develop more efficient breathing techniques such as purse lipped breathing and diaphragmatic breathing and practicing self-pacing with activity.;Hypertension;Stress;Weight Management/Obesity;Improve shortness of breath with ADL's;Increase knowledge of respiratory medications and ability to use respiratory devices properly.  Develop more efficient breathing techniques such as purse lipped breathing and  diaphragmatic breathing and practicing self-pacing with activity.;Hypertension;Stress;Weight Management/Obesity;Improve shortness of breath with ADL's;Increase knowledge of respiratory medications and ability to use respiratory devices properly.    Review  Patient continues to work as hard as he can at each session in pulmonary rehab. He no longer tolerates workload increases and has had a recent appointment at his pulmonologist where both agreed that his disease has progressed. He recently spoke with the RD about his lack of appitite. Encourged patient to be a physically active as possible and to take as many restbreaks as necessary. It may be time to discuss maintaining his physical abilitilies more than progression towards goals.  Patient continues to work as hard as he can at each session in pulmonary rehab. He no longer tolerates workload increases. He understands his disease has progressed however recent conversations with patient indicate that he is no where near ready to "give up". He wants to continue to come to pulmonary rehab and enjoys the interactions with staff and classmates. He recently spoke with the RD about his lack of appitite. She gave him suggestions to manage GI upset from [facility] and those suggestions are working.Encourged patient to be a physically active as possible and to take as many restbreaks as necessary. We have discuss maintaining his physical abilitilies more than progression. This is OK with him. He is limited to home exercise currently related to the need for higher flow oxygen.  Continues to work as hard as he can at each pulmonary rehab session. We are currently maintaining workloads and not increasing them. He recently had an appointment with his pulmonologist and verbalized his need for care for depression. He has been referred to psychiatry for medication management. His desires to continue to be active are appreciated and every feasible attempt will be made to help him achieve  his desires. Will continue to monitor and progress as tolerated.  breathing technique, improvement in shortness of breath, knowledge of respiratory medications, hypertension, and stress met. The patient did not loose any weight.    Expected Outcomes  see admission expected outcomes  see admission expected outcomes  see admission expected outcomes  see admission expected outcomes       Exercise Goals and Review:   Nutrition & Weight - Outcomes:  Post Biometrics - 10/13/17 0742       Post  Biometrics   Grip Strength  19 kg       Nutrition:   Nutrition Discharge: Nutrition Assessments - 10/13/17 1421      Rate Your Plate Scores   Pre Score  53    Post Score  59       Education Questionnaire  Score: Knowledge Questionnaire Score - 10/13/17 0847      Knowledge Questionnaire Score   Pre Score  11/13    Post Score  12/13       Goals reviewed with patient; copy given to patient. Patient plans to continue to exercise at home on his treadmill

## 2017-10-16 ENCOUNTER — Encounter (HOSPITAL_COMMUNITY): Payer: Medicare Other

## 2017-10-17 ENCOUNTER — Encounter (HOSPITAL_COMMUNITY): Payer: Self-pay | Admitting: Psychiatry

## 2017-10-17 ENCOUNTER — Ambulatory Visit (INDEPENDENT_AMBULATORY_CARE_PROVIDER_SITE_OTHER): Payer: Medicare Other | Admitting: Psychiatry

## 2017-10-17 VITALS — BP 130/80 | HR 87 | Ht 65.0 in | Wt 205.8 lb

## 2017-10-17 DIAGNOSIS — J84112 Idiopathic pulmonary fibrosis: Secondary | ICD-10-CM

## 2017-10-17 DIAGNOSIS — R450 Nervousness: Secondary | ICD-10-CM | POA: Diagnosis not present

## 2017-10-17 DIAGNOSIS — Z87891 Personal history of nicotine dependence: Secondary | ICD-10-CM | POA: Diagnosis not present

## 2017-10-17 DIAGNOSIS — R0602 Shortness of breath: Secondary | ICD-10-CM

## 2017-10-17 DIAGNOSIS — F419 Anxiety disorder, unspecified: Secondary | ICD-10-CM

## 2017-10-17 DIAGNOSIS — Z810 Family history of intellectual disabilities: Secondary | ICD-10-CM | POA: Diagnosis not present

## 2017-10-17 DIAGNOSIS — G4700 Insomnia, unspecified: Secondary | ICD-10-CM

## 2017-10-17 DIAGNOSIS — F331 Major depressive disorder, recurrent, moderate: Secondary | ICD-10-CM

## 2017-10-17 MED ORDER — DULOXETINE HCL 30 MG PO CPEP
30.0000 mg | ORAL_CAPSULE | Freq: Every day | ORAL | 1 refills | Status: DC
Start: 2017-10-17 — End: 2017-11-20
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
## 2017-10-21 ENCOUNTER — Encounter (HOSPITAL_COMMUNITY): Payer: Medicare Other

## 2017-10-23 ENCOUNTER — Encounter (HOSPITAL_COMMUNITY): Payer: Medicare Other

## 2017-10-24 ENCOUNTER — Telehealth: Payer: Self-pay | Admitting: Pulmonary Disease

## 2017-10-24 NOTE — Telephone Encounter (Signed)
Called  Nintedanib (OFEV) 150 MG CAPS [[id number]][[id number]]  Order Details  Dose: 150 mg Route: Oral Frequency: 2 times daily  Dispense Quantity: 60 capsule Refills: -- Fills remaining: --        Sig: Take 150 mg by mouth 2 (two) times daily.       Written Date: -- Expiration Date: -- Ordering Date: 12/19/16   Start Date: 12/19/16 End Date: --         Ordering Provider:  Lupita LeashMcQuaid, Douglas B, MD DEA #:  [id number] NPI:  [id number]   Authorizing Provider:  Lupita LeashMcQuaid, Douglas B, MD DEA #:  [id number] NPI:  [id number]   Ordering User:  Velvet Batheaulfield, Ashley L, CMA            Pharmacy:  [id number]- [place], [place] - [address] DEA #:  [id number]:  [id number]    Pharmacy Comments:  --       Fill quantity remaining:  -- Fill quantity used:  --

## 2017-10-24 NOTE — Telephone Encounter (Signed)
AC lets just send the RX when BQ signs the application, pt states he has some medication for now, the pharmacy gave him some to take. The last RX states we sent it to [facility] on [address]? Pt states he gets the OFEV through the [employer]. Their fax number should be on the sheet BQ has.

## 2017-10-29 NOTE — Telephone Encounter (Signed)
Holding form for BQ's return to clinic for signature.

## 2017-10-29 NOTE — Telephone Encounter (Signed)
Spoke with Gearldine BienenstockBrandy at [facility], aware that forms have been received and we are waiting on BQ to return to clinic and sign forms before they can be faxed back.

## 2017-10-29 NOTE — Telephone Encounter (Signed)
Brandy from [employer] calling to see if we have received an enrollment form for this pt's Ofev. Cb is [phone] Option 1. Fax number [phone]

## 2017-10-31 NOTE — Telephone Encounter (Signed)
Called pt letting him know we have received the forms that need to be signed by BQ for pt to receive OFEV.   Stated to pt we would have BQ sign the forms once he returns back to the office.  Pt expressed understanding. Nothing further needed at this current time.

## 2017-10-31 NOTE — Telephone Encounter (Signed)
Gearldine BienenstockBrandy is calling back [phone] opt #1

## 2017-10-31 NOTE — Telephone Encounter (Signed)
Pt is calling about the OFEV [phone]

## 2017-11-07 ENCOUNTER — Ambulatory Visit (INDEPENDENT_AMBULATORY_CARE_PROVIDER_SITE_OTHER): Payer: Medicare Other | Admitting: Pulmonary Disease

## 2017-11-07 ENCOUNTER — Encounter: Payer: Self-pay | Admitting: Pulmonary Disease

## 2017-11-07 VITALS — BP 142/76 | HR 82 | Ht 67.0 in | Wt 203.0 lb

## 2017-11-07 DIAGNOSIS — J9611 Chronic respiratory failure with hypoxia: Secondary | ICD-10-CM

## 2017-11-07 DIAGNOSIS — R609 Edema, unspecified: Secondary | ICD-10-CM | POA: Diagnosis not present

## 2017-11-07 DIAGNOSIS — I10 Essential (primary) hypertension: Secondary | ICD-10-CM

## 2017-11-07 DIAGNOSIS — F3289 Other specified depressive episodes: Secondary | ICD-10-CM

## 2017-11-07 MED ORDER — FUROSEMIDE 40 MG PO TABS: ORAL_TABLET | ORAL | 1 refills | Status: DC

## 2017-11-07 MED ORDER — HYDROCOD POLST-CPM POLST ER 10-8 MG/5ML PO SUER: 5.0000 mL | Freq: Two times a day (BID) | ORAL | 0 refills | Status: DC | PRN

## 2017-11-07 NOTE — Progress Notes (Signed)
Subjective:    Patient ID: Jeffrey Cobb, male    DOB: [birth year], 77 y.o.   MRN: [id number]  Synopsis: Former patient of Dr. Shelle Cobb who has chronic cough and UIP Felt to be due to idiopathic pulmonary fibrosis  Quit smoking in 1979 after 16 years of smoking 2 ppd He startedon oxygen in 09/2016 Started on Ofev in 2018  HPI Chief Complaint  Patient presents with  . Follow-up    c/o prod cough with white/yellow mucus, worsening SOB with exertion.     [name] says he is holding his own.  He has some minor problems with leaving the house to get in his truck.  He says that he is some short of breath after walking to the truck and has to stop and catch his breath for 10-15 minutes before feeling comfortable to drive. The dyspnea is severe with minimal walking, even just 25-30 feet.  He says that the oxygen helps.  Otherwise he hasn't hda an abrupt problem like bronchitis or an infection.    He has been having more ankle swelling lately.  This will extend up into his shins from time to time.    He is still bothered by the depression.  He says that even though he is taking the higher dose of Cymbalta he still has times where he does not feel motivated to get up and do anything.  He says that he ends up sitting around most of the time and he thinks that this is due to the severity of the depression.  He was told by his psychiatrist that he needs to see a therapist but he has not had a referral yet.   Past Medical History:  Diagnosis Date  . Acute sinusitis, unspecified   . Arthritis   . 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 GERD (gastroesophageal reflux disease)   . Left anterior fascicular block    hx of on ekg  . Mixed hyperlipidemia   . Obstructive chronic bronchitis with exacerbation (HCC)   . Occlusion and stenosis of carotid artery without mention of cerebral infarction    a. 05/2013 Carotid U/S: 1-39% bilat stenosis.  . Other emphysema  (HCC)   . 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 Sleep apnea    does not use cpap due to cough  . Stroke [facility]) 05/2015      Review of Systems  Constitutional: Negative for chills, fatigue and fever.  HENT: Positive for rhinorrhea. Negative for sinus pressure and sneezing.   Respiratory: Positive for cough. Negative for shortness of breath and wheezing.   Cardiovascular: Negative for chest pain, palpitations and leg swelling.       Objective:   Physical Exam Vitals:   11/07/17 1109  BP: (!) 142/76  Pulse: 82  SpO2: 94%  Weight: 203 lb (92.1 kg)  Height: 5\' 7"  (1.702 m)   3L pm pulse  Gen: chronically ill appearing HENT: OP clear, TM's clear, neck supple PULM: Crackles 2/3 up bilaterally B, normal percussion CV: RRR, no mgr, trace edema GI: BS+, soft, nontender Derm: no cyanosis or rash Psyche: normal mood and affect     CBC    Component Value Date/Time   WBC 6.7 05/31/2015 1730   RBC 4.64 05/31/2015 1730   HGB 14.6 05/31/2015 1730   HCT 41.5 05/31/2015 1730   PLT 138 (L) 05/31/2015 1730   MCV 89.4 05/31/2015 1730  MCH 31.5 05/31/2015 1730   MCHC 35.2 05/31/2015 1730   RDW 13.4 05/31/2015 1730   LYMPHSABS 2.2 09/02/2014 1204   MONOABS 0.7 09/02/2014 1204   EOSABS 0.2 09/02/2014 1204   BASOSABS 0.0 09/02/2014 1204     PFT PFT"s 2009:  FEV1 3.49 (115%), ratio 65, DLCO 67%. PFT's 08/2014:  FEV1 3.13 (114%), ratio 69, decreased airtrapping from 2009, DLCO 49% June 2017 pulmonary function testing FEV1 2.99 L (111% predicted, FVC 3.67 L (98% predicted), total lung capacity 5.53 L (85% predicted), DLCO 12.76 (45% predicted). March 2018 pulmonary function testing ratio 80%, FVC 2.94 L 79% predicted, total lung capacity 4.51 L 69% predicted, DLCO 9.05 31% predicted 05/2017 PFT> FVC 2.6L, DLCO 7.1 24% pred  Imaging: 01/2016 HRCT > traction bronchiolectasis and interlobular septal thickening in a basilar predominance have progressed  compared to July 2016, there remains no frank honeycombing. There are multiple pulmonary nodules which are stable or have resolved. February 2018 high-resolution CT chest independently reviewed showing traction bronchiectasis, interlobular septal thickening and honeycombing worse in the periphery and bases.  6 minute walk test: August 2016 6 minute walk test 336 m O2 sats ration 92% on room air 04/27/2016 6 minute walk distance 292.5 m O2 sats ration and completion of test was 82% on room air. July 2018 6 minute walk: Distance 1231 feet, O2 saturation 88% on 10 L nasal cannula October 2018 distance 216 m  Other testing: September 2017 pH probe showed normal pH and no clear evidence of gastroesophageal reflux.  2018 echocardiogram normal LVEF, RVSP 38  CPAP compliance report 02/2017> only using 39% of nights       Assessment & Plan:  Essential hypertension - Plan: Ambulatory Referral for DME  Chronic respiratory failure with hypoxia (HCC) - Plan: Ambulatory Referral for DME  Edema, unspecified type - Plan: Ambulatory Referral for DME  Other depression - Plan: Ambulatory referral to Behavioral Health  Discussion: Jeffrey Cobb is feeling a bit more short of breath which is not terribly surprising considering the nature of his disease.  He is compliant with his oxygen though his depression recently has kept him from exercising at home as much as he would like.  This seems to be the bulk of his problems and he says he would like to see a therapist to talk about his depression.  He is having more leg swelling which is undoubtedly due to worsening pulmonary hypertension from his severe idiopathic pulmonary fibrosis.   Plan: Idiopathic pulmonary fibrosis: Continue taking Ofev as you are doing We will repeat liver function testing on the next visit to monitor for toxicity Be sure to cover up if you are out in the sun over the next few weeks  Chronic respiratory failure with hypoxemia: Continue  5 L at rest, 10 L with exercise  Cough: Continue to use Mucinex as needed for chest congestion Please let me know if you have several days of worsening chest congestion and cough Use the Tussionex as needed for cough  Depression: We will make a referral to [facility] to help with this Continue Cymbalta as directed by psychiatry  Leg swelling: We will prescribe compression stockings We will also prescribe Lasix at a higher dose use this as needed until the ankle swelling has improved then use it only on an as-needed basis. Be sure to eat a banana on the day you take the Lasix   We will see you back in 6-8 weeks or sooner if needed  Current Outpatient Medications:  .  albuterol (PROVENTIL) (2.5 MG/3ML) 0.083% nebulizer solution, Take 3 mLs (2.5 mg total) by nebulization every 6 (six) hours as needed for wheezing or shortness of breath., Disp: 360 mL, Rfl: 11 .  aspirin 81 MG tablet, Take 81 mg by mouth daily., Disp: , Rfl:  .  atorvastatin (LIPITOR) 40 MG tablet, Take 1 tablet (40 mg total) by mouth daily at 6 PM., Disp: 30 tablet, Rfl: 0 .  chlorpheniramine-HYDROcodone (TUSSIONEX PENNKINETIC ER) 10-8 MG/5ML SUER, Take 5 mLs by mouth every 12 (twelve) hours as needed for cough., Disp: 140 mL, Rfl: 0 .  cyanocobalamin 500 MCG tablet, Take 1,000 mcg by mouth daily., Disp: , Rfl:  .  doxycycline (VIBRA-TABS) 100 MG tablet, Take 1 tablet (100 mg total) by mouth 2 (two) times daily., Disp: 10 tablet, Rfl: 0 .  DULoxetine (CYMBALTA) 30 MG capsule, Take 1 capsule (30 mg total) by mouth daily., Disp: 60 capsule, Rfl: 1 .  guaiFENesin (MUCINEX) 600 MG 12 hr tablet, Take 1 tablet (600 mg total) by mouth 2 (two) times daily., Disp: 1 tablet, Rfl: 3 .  Nintedanib (OFEV) 150 MG CAPS, Take 150 mg by mouth 2 (two) times daily., Disp: 60 capsule, Rfl:  .  oxymetazoline (AFRIN) 0.05 % nasal spray, Place 1 spray into both nostrils as needed for congestion., Disp: , Rfl:  .  pramipexole  (MIRAPEX) 0.5 MG tablet, TAKE 1 TABLET BY MOUTH EVERY MORNING AND TAKE 1 TABLET EVERY EVENING, Disp: 180 tablet, Rfl: 3 .  PROAIR HFA 108 (90 BASE) MCG/ACT inhaler, Inhale 2 puffs into the lungs every 6 (six) hours as needed., Disp: , Rfl:  .  QUEtiapine (SEROQUEL) 100 MG tablet, , Disp: , Rfl:  .  sodium chloride HYPERTONIC 3 % nebulizer solution, Take by nebulization 2 (two) times daily as needed for other., Disp: 224 mL, Rfl: 12 .  triamcinolone cream (KENALOG) 0.1 %, APPLY 1 APPLICATION ON THE SKIN TWICE A DAY AS NEEDED, Disp: , Rfl: 2 .  furosemide (LASIX) 40 MG tablet, Take daily as needed for leg swelling., Disp: 15 tablet, Rfl: 1

## 2017-11-07 NOTE — Patient Instructions (Signed)
Plan: Idiopathic pulmonary fibrosis: Continue taking Ofev as you are doing We will repeat liver function testing on the next visit to monitor for toxicity Be sure to cover up if you are out in the sun over the next few weeks  Chronic respiratory failure with hypoxemia: Continue 5 L at rest, 10 L with exercise  Cough: Continue to use Mucinex as needed for chest congestion Please let me know if you have several days of worsening chest congestion and cough Use the Tussionex as needed for cough  Depression: We will make a referral to [facility] to help with this Continue Cymbalta as directed by psychiatry  Leg swelling: We will prescribe compression stockings We will also prescribe Lasix at a higher dose use this as needed until the ankle swelling has improved then use it only on an as-needed basis. Be sure to eat a banana on the day you take the Lasix   We will see you back in 6-8 weeks or sooner if needed

## 2017-11-20 ENCOUNTER — Encounter (HOSPITAL_COMMUNITY): Payer: Self-pay | Admitting: Psychiatry

## 2017-11-20 ENCOUNTER — Ambulatory Visit (INDEPENDENT_AMBULATORY_CARE_PROVIDER_SITE_OTHER): Payer: Medicare Other | Admitting: Psychiatry

## 2017-11-20 VITALS — BP 146/71 | HR 83 | Ht 67.0 in | Wt 204.0 lb

## 2017-11-20 DIAGNOSIS — F419 Anxiety disorder, unspecified: Secondary | ICD-10-CM

## 2017-11-20 DIAGNOSIS — R251 Tremor, unspecified: Secondary | ICD-10-CM | POA: Diagnosis not present

## 2017-11-20 DIAGNOSIS — J8410 Pulmonary fibrosis, unspecified: Secondary | ICD-10-CM | POA: Diagnosis not present

## 2017-11-20 DIAGNOSIS — F331 Major depressive disorder, recurrent, moderate: Secondary | ICD-10-CM

## 2017-11-20 DIAGNOSIS — Z87891 Personal history of nicotine dependence: Secondary | ICD-10-CM

## 2017-11-20 DIAGNOSIS — R0602 Shortness of breath: Secondary | ICD-10-CM | POA: Diagnosis not present

## 2017-11-20 DIAGNOSIS — R450 Nervousness: Secondary | ICD-10-CM

## 2017-11-20 DIAGNOSIS — F411 Generalized anxiety disorder: Secondary | ICD-10-CM

## 2017-11-20 MED ORDER — ESCITALOPRAM OXALATE 10 MG PO TABS: ORAL_TABLET | ORAL | 0 refills | Status: DC

## 2017-11-20 NOTE — Progress Notes (Signed)
BH MD/PA/NP OP Progress Note  11/20/2017 10:13 AM Nance PewRufus B Wavra  MRN:  [id number]  Chief Complaint: I am feeling the same.  I have no energy.  HPI: Patient is [age] year old Caucasian married man who was seen first time 4 weeks ago referred from his pulmonologist for the management of depression.  Patient diagnosed with pulmonary fibrosis year ago and since then he is depression has been getting worse.  He has no energy, no motivation to do things.  He has been isolated withdrawn, fatigued with lack of attention and concentration.  He had limited participation in daily activities.  Though he denies any suicidal thoughts or homicidal thought but admitted at times hopelessness and ruminative thoughts.  His primary care physician is started him on Cymbalta and we recommended to increase to 30 mg twice a day.  Patient told not much improvement with increase Cymbalta.  He is still feels sad, withdrawn and sometimes forgetful.  He is tolerating to schedule appointment to see a therapist for CBT.  Patient has multiple health issues including arthritis, knee pain, history of TIA, history of back surgery, history of shoulder surgery, sleep apnea and idiopathic pulmonary fibrosis.  He lives with his wife who is very supportive.  He has a son and daughter who lives out of town.  Patient also taking Seroquel 100 mg at bedtime.  He admitted he sleeps better with Seroquel but during the day he just stays to himself.  He denies any hallucination or any paranoia.  His appetite is fair.  His wife concerned because sometimes he does not eat during the day.  He has mild tremors in his hand.  Patient denies drinking alcohol or using any illegal substances.  Visit Diagnosis:    ICD-10-CM   1. MDD (major depressive disorder), recurrent episode, moderate (HCC) F33.1 escitalopram (LEXAPRO) 10 MG tablet    Past Psychiatric History: Reviewed. Patient reported history of depression after his first knee surgery in 2001.  He was  given amitriptyline which worked very well then stopped working.  After his second knee surgery he was given Zoloft which worked for a while and then stopped working.  His primary care physician started him on Seroquel to help sleep.  In 2018 he was given Cymbalta to help his depression.  We recently increased the dose to 30 mg twice a day with poor outcome.  Patient denies any history of psychosis, hallucination, paranoia or any suicidal attempt.  Past Medical History:  Past Medical History:  Diagnosis Date  . Acute sinusitis, unspecified   . Arthritis   . 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 GERD (gastroesophageal reflux disease)   . Left anterior fascicular block    hx of on ekg  . Mixed hyperlipidemia   . Obstructive chronic bronchitis with exacerbation (HCC)   . Occlusion and stenosis of carotid artery without mention of cerebral infarction    a. 05/2013 Carotid U/S: 1-39% bilat stenosis.  . Other emphysema (HCC)   . 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 Sleep apnea    does not use cpap due to cough  . Stroke [facility](HCC) 05/2015    Past Surgical History:  Procedure Laterality Date  . APPENDECTOMY  1956  . BRAVO PH STUDY N/A 05/02/2016   Procedure: BRAVO PH STUDY;  Surgeon: Rachael Feeaniel P Jacobs, MD;  Location: [facility];  Service: Endoscopy;  Laterality: N/A;  . ESOPHAGOGASTRODUODENOSCOPY (EGD) WITH PROPOFOL N/A  05/02/2016   Procedure: ESOPHAGOGASTRODUODENOSCOPY (EGD) WITH PROPOFOL;  Surgeon: Rachael Fee, MD;  Location: [facility];  Service: Endoscopy;  Laterality: N/A;  . EYE SURGERY Bilateral    cataract removal  . JOINT REPLACEMENT Right    shoulder replacement  . LEFT KNEE REPLACEMENT  2001  . RIGHT ANKLE RECONSTRUCTION    . RIGHT KNEE REPLACEMENT  2005  . RUPTURED DISK  2000  . SPINE SURGERY  2000/2011  . torn retina Left   . TOTAL SHOULDER ARTHROPLASTY Left 06/30/2013   Procedure: TOTAL SHOULDER  ARTHROPLASTY;  Surgeon: Loreta Ave, MD;  Location: [facility] OR;  Service: Orthopedics;  Laterality: Left;    Family Psychiatric History: Reviewed  Family History:  Family History  Problem Relation Age of Onset  . Emphysema Mother   . Heart disease Mother   . Alzheimer's disease Mother   . Heart disease Father   . Stroke Paternal Grandfather   . Allergies Unknown        FH: FATHER,2 SISTER,1 BROTHER, SON  DAUGHTER  . Cancer Sister     Social History:  Social History   Socioeconomic History  . Marital status: Married    Spouse name: Myriam Jacobson   . Number of children: 2  . Years of education: Assoc   . Highest education level: Not on file  Occupational History  . Occupation: Retired    Associate Professor: RETIRED  Social Needs  . Financial resource strain: Not hard at all  . Food insecurity:    Worry: Never true    Inability: Never true  . Transportation needs:    Medical: No    Non-medical: No  Tobacco Use  . Smoking status: Former Smoker    Packs/day: 3.00    Years: 17.00    Pack years: 51.00    Types: Cigarettes    Last attempt to quit: 08/26/1977    Years since quitting: 40.2  . Smokeless tobacco: Never Used  Substance and Sexual Activity  . Alcohol use: Yes    Comment: rarely  . Drug use: No  . Sexual activity: Not on file  Lifestyle  . Physical activity:    Days per week: 2 days    Minutes per session: 30 min  . Stress: Not at all  Relationships  . Social connections:    Talks on phone: Three times a week    Gets together: Once a week    Attends religious service: Never    Active member of club or organization: Yes    Attends meetings of clubs or organizations: Never    Relationship status: Married  Other Topics Concern  . Not on file  Social History Narrative   Patient is married Myriam Jacobson) and lives at home with his wife.   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    Patient is left handed   Patient drinks 1-2 cups of coffee  in the morning and 3-4 cups of tea daily.                Allergies:  Allergies  Allergen Reactions  . Gabapentin     Dizziness   . Sulfonamide Derivatives Rash  . Trazodone And Nefazodone Rash and Other (See Comments)    Hallucinations     Metabolic Disorder Labs: No results found for this or any previous visit (from the past 2160 hour(s)). Lab Results  Component Value Date   HGBA1C 5.7 (H) 06/01/2015   MPG  117 06/01/2015   No results found for: PROLACTIN Lab Results  Component Value Date   CHOL 170 06/01/2015   TRIG 46 06/01/2015   HDL 58 06/01/2015   CHOLHDL 2.9 06/01/2015   VLDL 9 06/01/2015   LDLCALC 103 (H) 06/01/2015   LDLCALC 90 01/17/2011   Lab Results  Component Value Date   TSH 2.265 06/01/2015    Therapeutic Level Labs: No results found for: LITHIUM No results found for: VALPROATE No components found for:  CBMZ  Current Medications: Current Outpatient Medications  Medication Sig Dispense Refill  . albuterol (PROVENTIL) (2.5 MG/3ML) 0.083% nebulizer solution Take 3 mLs (2.5 mg total) by nebulization every 6 (six) hours as needed for wheezing or shortness of breath. 360 mL 11  . aspirin 81 MG tablet Take 81 mg by mouth daily.    Marland Kitchen atorvastatin (LIPITOR) 40 MG tablet Take 1 tablet (40 mg total) by mouth daily at 6 PM. 30 tablet 0  . chlorpheniramine-HYDROcodone (TUSSIONEX PENNKINETIC ER) 10-8 MG/5ML SUER Take 5 mLs by mouth every 12 (twelve) hours as needed for cough. 140 mL 0  . cyanocobalamin 500 MCG tablet Take 1,000 mcg by mouth daily.    Marland Kitchen doxycycline (VIBRA-TABS) 100 MG tablet Take 1 tablet (100 mg total) by mouth 2 (two) times daily. 10 tablet 0  . DULoxetine (CYMBALTA) 30 MG capsule Take 1 capsule (30 mg total) by mouth daily. 60 capsule 1  . furosemide (LASIX) 40 MG tablet Take daily as needed for leg swelling. 15 tablet 1  . guaiFENesin (MUCINEX) 600 MG 12 hr tablet Take 1 tablet (600 mg total) by mouth 2 (two) times daily. 1 tablet 3  .  Nintedanib (OFEV) 150 MG CAPS Take 150 mg by mouth 2 (two) times daily. 60 capsule   . oxymetazoline (AFRIN) 0.05 % nasal spray Place 1 spray into both nostrils as needed for congestion.    . pramipexole (MIRAPEX) 0.5 MG tablet TAKE 1 TABLET BY MOUTH EVERY MORNING AND TAKE 1 TABLET EVERY EVENING 180 tablet 3  . PROAIR HFA 108 (90 BASE) MCG/ACT inhaler Inhale 2 puffs into the lungs every 6 (six) hours as needed.    Marland Kitchen QUEtiapine (SEROQUEL) 100 MG tablet     . sodium chloride HYPERTONIC 3 % nebulizer solution Take by nebulization 2 (two) times daily as needed for other. 224 mL 12  . triamcinolone cream (KENALOG) 0.1 % APPLY 1 APPLICATION ON THE SKIN TWICE A DAY AS NEEDED  2   No current facility-administered medications for this visit.      Musculoskeletal: Strength & Muscle Tone: decreased Gait & Station: Slow walking Patient leans: N/A  Psychiatric Specialty Exam: Review of Systems  Constitutional: Positive for malaise/fatigue.  HENT: Negative.   Respiratory: Positive for shortness of breath.   Skin: Negative.   Neurological: Positive for tremors.  Psychiatric/Behavioral: Positive for depression. The patient is nervous/anxious.     Blood pressure (!) 146/71, pulse 83, height 5\' 7"  (1.702 m), weight 204 lb (92.5 kg).Body mass index is 31.95 kg/m.  General Appearance: Casual and Walks with a oxygen tank  Eye Contact:  Fair  Speech:  Slow  Volume:  Decreased  Mood:  Dysphoric  Affect:  Constricted  Thought Process:  Goal Directed  Orientation:  Full (Time, Place, and Person)  Thought Content: Rumination   Suicidal Thoughts:  No  Homicidal Thoughts:  No  Memory:  Immediate;   Fair Recent;   Fair Remote;   Fair  Judgement:  Good  Insight:  Good  Psychomotor Activity:  Normal  Concentration:  Concentration: Fair and Attention Span: Fair  Recall:  [employer] of Knowledge: Good  Language: Good  Akathisia:  No  Handed:  Right  AIMS (if indicated): not done  Assets:   Communication Skills Desire for Improvement Housing Resilience Social Support  ADL's:  Intact  Cognition: WNL  Sleep:  Fair   Screenings: PHQ2-9     PULMONARY REHAB OTHER RESP ORIENTATION from 04/25/2017 in [facility]ARDIAC REHAB PULMONARY REHAB OTHER RESPIRATORY from 03/04/2017 in [facility] C[facility]AC [facility]HAB PULMONARY REHAB OTHER RESP ORIENTATION from 11/15/2016 in [facility] CARDIAC REHAB  PHQ-2 Total Score  0  0  0       Assessment and Plan: Major depressive disorder, recurrent.  Generalized anxiety disorder.  Patient do not see any improvement with increase Cymbalta.  He is taking 30 mg twice a day.  Patient and his wife like to try a different medication.  In the past they have tried amitriptyline and Zoloft which worked very well then stopped working.  They have never tried Lexapro.  I recommended to try Lexapro 5 mg daily for 1 week and then 10 mg daily.  They would reduce Cymbalta from 60 mg to 30 mg for 1 week and then stop.  If patient tolerate Lexapro then we will consider increasing the dose up to 20 mg.  Reinforced to schedule appointment to see a therapist.  Provided list of therapist to him.  Patient also scheduled to see neurology in few weeks for his CPAP.  Recommended to call us back if he has any question or any concern.  Follow-up in 4 weeks.Time spent 25 minutes.  More than 50% of the time spent in psychoeducation, counseling and coordination of care.  Discuss safety plan that anytime having active suicidal thoughts or homicidal thoughts then patient need to call 911 or go to the local emergency room.   Cleotis Nipper, MD 11/20/2017, 10:13 AM

## 2017-12-08 NOTE — Progress Notes (Deleted)
[facility]  PATIENT: Jeffrey Cobb DOB: [birth year]   REASON FOR VISIT: Follow-up for CVA, restless legs, paresthesias, dyslipidemia, obstructive sleep apnea HISTORY FROM: Patient    HISTORY OF PRESENT ILLNESS:HistoryMr. Cobb, 77 year old male returns for followup. He has a history of restless leg syndrome and sensory dysesthesias. He also has a history of obstructive sleep apnea with good compliance of CPAP in the past however due to his chronic cough in October last year which did not respond to antibiotics or prednisone. He has been unable to use his CPAP since that time . He was referred to ENT and [facility]. On today's visit he also reports continued numbness, tingling/burning and discomfort in his feet, this is been an ongoing problem but it has worsened. EMG nerve conduction after last visit was normal. He has been placed on a trial of Neurontin by his primary care however had side effects of dizziness on the medication.He has a history of spinal stenosis and lumbar spine decompression 2011 by Dr. Gerlene FeeKritzer. He has had 2 epidural injections since last seen with minimal benefit. He denies any falls however he does feel off balance at times. He has intermittent back pain since his surgery. His restless legs are under good control with his Mirapex. He returns for reevaluation  MRI of the spine (lumbar) that he had done in April of 2009 showed moderate to severe spinal stenosis. He had lumbar spine decompression and fusion by Dr. Gerlene FeeKritzer in 2011, did very well. He also has a history of right and left knee replacements and a shoulder replacement. He is currently taking Mirapex for his restless legs tolerating the medication without drowsiness or any type of compulsive behaviors. iron profile was normal.   Update 12/5/2016PS : Patient is seen today for follow-up after her recent hospital admission for TIA on 05/31/15. He presented  following an episode of word finding difficulty as well as slurred speech. He was last known well at 4:30 PM on 05/31/2015. Deficits lasted about 45 minutes then resolved. He has no previous history of stroke nor TIA. [facility] and [facility]. He had not been on antiplatelets therapy. CT scan of his head showed no acute intracranial abnormality. NIH stroke score on admission evaluation was 0. Patient was not administered TPA secondary to Deficits resolved rapidly. He was admitted for further evaluation and treatment. MRI scan of the brain showed no acute infarct only mild age-related changes of cerebral atrophy and chronic small vessel ischemia. MRA of the brain showed no large vessel stenosis. Mild distal atheromatous changes were noted in MCA and PCA branches. Carotid ultrasound showed no significant Stenosis. Transthoracic echo showed normal ejection fraction. NIH stroke scale was 0. ABCD 2 score was 4. Patient qualified for and signed consent and participate in the PARFAIT TIA study. LDL cholesterol was elevated at 103 and he was started on Lipitor. He states his done well since discharge and has not had any recurrent stroke or TIA symptoms. He was seen by Dr. Roda ShuttersXu on 06/28/15 for 30 days study visit and study medication was discontinued. He is currently on aspirin 81 mg which is tolerating well without bleeding or bruising. He is also not having any side effects on Lipitor 40 mg daily which is taking everyday. He has not had any new complaints.  UPDATE 10/24/2015 CMMr Jeffrey Cobb, 77 year old returns for followup. He was last seen in the office  by Dr. Pearlean BrownieSethi 07/31/2015. He has history of TIA with an episode of word finding and slurred speech on 05/31/2015. He was started on baby aspirin. MRI without acute infarct. MRA no large vessel stenosis. Carotid ultrasound negative. LDL cholesterol was elevated and he was started on Lipitor. His restless legs are  in good control with Mirapex. He is also on amitriptyline at bedtime. He remains on Ambien for insomnia. He does not use his CPAP for his obstructive sleep apnea although he says he may start back.He has not had further stroke or TIA symptoms. He returns for reevaluation UPDATE 06/15/2017[name], 77 year old male returns for follow-up. History of TIA events with word finding difficulty and slurred speech which occurred in October 2016. He has not had further stroke or TIA symptoms and is currently on aspirin and Lipitor for secondary stroke prevention. He also has a history of restless legs which are in good control with Mirapex. His amitriptyline has been tapered by his pulmonologist. He is currently not using CPAP due to shortness of breath. He is due for a pulmonary function next week.He continues to complain of  numbness, tingling/burning and discomfort in his feet, this is been an ongoing problem , EMG nerve conduction in the past x 2  was normal.. He returns for reevaluation UPDATE 12/15/2017[name], 77 year old male returns for follow-up. He has a history of TIA event with word finding difficulty and slurred speech October 2016. He is currently on aspirin without bruising or bleeding. He has not had further stroke or TIA symptoms. In addition he is on Lipitor and denies myalgias. He is due to get labs done at primary care next week. He also has a long history of restless legs in good control with Mirapex. He has obstructive sleep apnea but does not use CPAP due to shortness of breath. He also has a history of chronic obstructive pulmonary disease and is due to have a pulmonary function test in January. Next Carotid Doppler's scheduled  October 11 2016 at  cardiovascular imaging. He does not exercise due to his shortness of breath. He returns for  reevaluation UPDATE 12/18/2018[name], 77 year old male returns for yearly follow-up with history of TIA in October 2016 he remains  on aspirin without signs of bleeding and minimal bruising.  He is not had further stroke or TIA symptoms.  He is on Lipitor for hyperlipidemia without myalgias.  He has a long history of restless leg syndrome in good control with Mirapex.  He needs refills.  He has obstructive sleep apnea and uses his CPAP with additional oxygen.  His carotid Dopplers are followed by cardiology, Dr. Kendrick FriesMcQuaid last done 10/23/2016.  He also has a history of idiopathic pulmonary fibrosis and gets short of breath with any exertion.  He is on continuous oxygen.  He returns for reevaluation Interval history from 09 September 2017, I have the pleasure of seeing [phone] ear old  [name] today after a more than 3 years hiatus in the sleep clinic, but he has [facility], and remains an active patient.  Dr. Pearlean BrownieSETHI ,  his stroke physician has originally sent him for a split-night polysomnography performed on 10 February 2014.  The patient had complex sleep apnea with an AHI of 40.6, RDI of 48.3 and slept all night and supine.  REM sleep was not entered.  Desaturation index was very high at 70.8/h of sleep, the patient had a total desaturation time under 88% of 1 hour and 40  minutes.  Nadir of oxygen was 80%.  There were no periodic limb movements with the diagnosis was severe sleep apnea, complex sleep apnea and associated with hypoxemia.  The patient was titrated to CPAP in the same night but remained hypoxemic with 194 minutes of desaturation while his apnea index reveals reduced to 0.3/h.  Final pressure was 10 cmH2O was 2 cm EPR and he has remained on this setting ever since.  Initially and nasal mask and large size was given to him the pico mask.  I have also Axis II days download.  Over the last 7 days the patient's headgear to keep his CPAP mask in place had broken and for this reason he is here today.  His compliance was reduced to 67% but if we eliminate the last week his compliance would be 85%.  Average use of time is 5  hours and 42 minutes, with a CPAP setting of 10 cmH2O was 2 cm EPR and a residual AHI of 1.8.  Air leaks seem to be minor. He is using XL sized PICO mask, needs new supplies, he is using in daytime a pulsating oxygen generator at 3 L a minute with nasal cannula, at night he has a stationary continues oxygen concentrator which bleeds oxygen into the CPAP.  He inquiered about a nasal pillow instead, the dream wear mask, and s assured me that he sleeps on his side all night- the mask wouldn't dislodge .     REVIEW OF SYSTEMS: Full 14 system review of systems performed and notable only for those listed, all others are neg:  Constitutional: neg  Cardiovascular: neg Ear/Nose/Throat: neg  Skin: neg Eyes: neg Respiratory: Shortness of breath ,cough Gastroitestinal: neg  Hematology/Lymphatic: Easy bruising  Endocrine: Intolerance to cold Musculoskeletal: neg Allergy/Immunology: neg Neurological: neg Psychiatric: Depression Sleep : Restless legs, obstructive sleep apnea    ALLERGIES: Allergies  Allergen Reactions  . Gabapentin     Dizziness   . Sulfonamide Derivatives Rash  . Trazodone And Nefazodone Rash and Other (See Comments)    Hallucinations     HOME MEDICATIONS: Outpatient Medications Prior to Visit  Medication Sig Dispense Refill  . albuterol (PROVENTIL) (2.5 MG/3ML) 0.083% nebulizer solution Take 3 mLs (2.5 mg total) by nebulization every 6 (six) hours as needed for wheezing or shortness of breath. 360 mL 11  . aspirin 81 MG tablet Take 81 mg by mouth daily.    Marland Kitchen atorvastatin (LIPITOR) 40 MG tablet Take 1 tablet (40 mg total) by mouth daily at 6 PM. 30 tablet 0  . chlorpheniramine-HYDROcodone (TUSSIONEX PENNKINETIC ER) 10-8 MG/5ML SUER Take 5 mLs by mouth every 12 (twelve) hours as needed for cough. 140 mL 0  . cyanocobalamin 500 MCG tablet Take 1,000 mcg by mouth daily.    Marland Kitchen doxycycline (VIBRA-TABS) 100 MG tablet Take 1 tablet (100 mg total) by mouth 2 (two) times daily. 10  tablet 0  . DULoxetine (CYMBALTA) 30 MG capsule Take 30 mg by mouth daily. daily for 1 week and than stop    . escitalopram (LEXAPRO) 10 MG tablet Take 1/2 tab daily for 1 week and than full tab daily 30 tablet 0  . furosemide (LASIX) 40 MG tablet Take daily as needed for leg swelling. 15 tablet 1  . guaiFENesin (MUCINEX) 600 MG 12 hr tablet Take 1 tablet (600 mg total) by mouth 2 (two) times daily. 1 tablet 3  . Nintedanib (OFEV) 150 MG CAPS Take 150 mg by mouth 2 (two) times daily.  60 capsule   . oxymetazoline (AFRIN) 0.05 % nasal spray Place 1 spray into both nostrils as needed for congestion.    . pramipexole (MIRAPEX) 0.5 MG tablet TAKE 1 TABLET BY MOUTH EVERY MORNING AND TAKE 1 TABLET EVERY EVENING 180 tablet 3  . PROAIR HFA 108 (90 BASE) MCG/ACT inhaler Inhale 2 puffs into the lungs every 6 (six) hours as needed.    Marland Kitchen QUEtiapine (SEROQUEL) 100 MG tablet     . sodium chloride HYPERTONIC 3 % nebulizer solution Take by nebulization 2 (two) times daily as needed for other. 224 mL 12  . triamcinolone cream (KENALOG) 0.1 % APPLY 1 APPLICATION ON THE SKIN TWICE A DAY AS NEEDED  2   No facility-administered medications prior to visit.     PAST MEDICAL HISTORY: Past Medical History:  Diagnosis Date  . Acute sinusitis, unspecified   . Arthritis   . 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 GERD (gastroesophageal reflux disease)   . Left anterior fascicular block    hx of on ekg  . Mixed hyperlipidemia   . Obstructive chronic bronchitis with exacerbation (HCC)   . Occlusion and stenosis of carotid artery without mention of cerebral infarction    a. 05/2013 Carotid U/S: 1-39% bilat stenosis.  . Other emphysema (HCC)   . 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 Sleep apnea    does not use cpap due to cough  . Stroke [facility]) 05/2015    PAST SURGICAL HISTORY: Past Surgical History:  Procedure Laterality Date  .  APPENDECTOMY  1956  . BRAVO PH STUDY N/A 05/02/2016   Procedure: BRAVO PH STUDY;  Surgeon: Rachael Fee, MD;  Location: [facility];  Service: Endoscopy;  Laterality: N/A;  . ESOPHAGOGASTRODUODENOSCOPY (EGD) WITH PROPOFOL N/A 05/02/2016   Procedure: ESOPHAGOGASTRODUODENOSCOPY (EGD) WITH PROPOFOL;  Surgeon: Rachael Fee, MD;  Location: [facility];  Service: Endoscopy;  Laterality: N/A;  . EYE SURGERY Bilateral    cataract removal  . JOINT REPLACEMENT Right    shoulder replacement  . LEFT KNEE REPLACEMENT  2001  . RIGHT ANKLE RECONSTRUCTION    . RIGHT KNEE REPLACEMENT  2005  . RUPTURED DISK  2000  . SPINE SURGERY  2000/2011  . torn retina Left   . TOTAL SHOULDER ARTHROPLASTY Left 06/30/2013   Procedure: TOTAL SHOULDER ARTHROPLASTY;  Surgeon: Loreta Ave, MD;  [facility] OR;  Service: Orthopedics;  Laterality: Left;    FAMILY HISTORY: Family History  Problem Relation Age of Onset  . Emphysema Mother   . Heart disease Mother   . Alzheimer's disease Mother   . Heart disease Father   . Stroke Paternal Grandfather   . Allergies Unknown        FH: FATHER,2 SISTER,1 BROTHER, SON  DAUGHTER  . Cancer Sister     SOCIAL HISTORY: Social History   Socioeconomic History  . Marital status: Married    Spouse name: Myriam Jacobson   . Number of children: 2  . Years of education: Assoc   . Highest education level: Not on file  Occupational History  . Occupation: Retired    Associate Professor: RETIRED  Social Needs  . Financial resource strain: Not hard at all  . Food insecurity:    Worry: Never true    Inability: Never true  . Transportation needs:    Medical: No    Non-medical: No  Tobacco Use  . Smoking status: Former  Smoker    Packs/day: 3.00    Years: 17.00    Pack years: 51.00    Types: Cigarettes    Last attempt to quit: 08/26/1977    Years since quitting: 40.3  . Smokeless tobacco: Never Used  Substance and Sexual Activity  . Alcohol use: Yes    Comment: rarely  . Drug use: No    . Sexual activity: Not on file  Lifestyle  . Physical activity:    Days per week: 2 days    Minutes per session: 30 min  . Stress: Not at all  Relationships  . Social connections:    Talks on phone: Three times a week    Gets together: Once a week    Attends religious service: Never    Active member of club or organization: Yes    Attends meetings of clubs or organizations: Never    Relationship status: Married  . Intimate partner violence:    Fear of current or ex partner: No    Emotionally abused: No    Physically abused: No    Forced sexual activity: No  Other Topics Concern  . Not on file  Social History Narrative   Patient is married Myriam Jacobson) and lives at home with his wife.   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    Patient is left handed   Patient drinks 1-2 cups of coffee in the morning and 3-4 cups of tea daily.                  PHYSICAL EXAM  There were no vitals filed for this visit. There is no height or weight on file to calculate BMI. Generalized: Well developed, Mildly obese male in no acute distress  Head: normocephalic and atraumatic,.  Neck: Supple, no carotid bruits  Cardiac: Regular rate rhythm, no murmur  Musculoskeletal: Arthritic changes in hands  Neurological examination   Mentation: Alert oriented to time, place, history taking. Follows all commands speech and language fluent.  Cranial nerve II-XII: Pupils were equal round reactive to light extraocular movements were full, visual field were full on confrontational test. Facial sensation and strength were normal. hearing was intact to finger rubbing bilaterally. Uvula tongue midline. head turning and shoulder shrug were normal and symmetric.Tongue protrusion into cheek strength was normal. Motor: normal bulk and tone, full strength in the BUE, BLE, fine finger movements normal, no pronator drift.  Sensory: Mildly decreased light touch and pinprick  to mid shin bilaterally, vibration normal.   Coordination: finger-nose-finger, heel-to-shin bilaterally, no dysmetria Reflexes: Brachioradialis 2/2, biceps 2/2, triceps 2/2, patellar 0/0, Achilles 1/1, plantar responses were flexor bilaterally. Gait and Station: Rising up from seated position without assistance, normal stance, moderate stride,  short of breath easily   DIAGNOSTIC DATA (LABS, IMAGING, TESTING) - I reviewed patient records, labs, notes, testing and imaging myself where available.      Component Value Date/Time   NA 133 (L) 06/11/2017 1115   K 4.0 06/11/2017 1115   CL 98 06/11/2017 1115   CO2 26 06/11/2017 1115   GLUCOSE 106 (H) 06/11/2017 1115   BUN 12 06/11/2017 1115   CREATININE 1.02 06/11/2017 1115   CREATININE 1.13 10/06/2015 0857   CALCIUM 9.2 06/11/2017 1115   PROT 7.6 08/13/2017 1455   ALBUMIN 3.8 08/13/2017 1455   AST 19 08/13/2017 1455   ALT 13 08/13/2017 1455   ALKPHOS 73 08/13/2017 1455   BILITOT 1.0 08/13/2017  1455   GFRNONAA >60 06/01/2015 2038   GFRAA >60 06/01/2015 2038    ASSESSMENT AND PLAN 77 y.o. year old male has a past medical history of restless leg syndrome Occlusion and stenosis of carotid artery without mention of cerebral infarction; Obstructive chronic bronchitis with exacerbation; Other emphysema; Mixed hyperlipidemia; Persistent disorder of initiating or maintaining sleep; Sleep apnea. 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 No further stroke or TIA symptoms and idiopathic pulmonary fibrosis  Continue aspirin for secondary stroke prevention Blood pressure with systolic 130/90 or below today's reading 140/78 Keep LDL goal below 100 continue Lipitor  healthy diet, exercise as tolerated Continue CPAP for obstructive sleep apnea followed by cardiology Continue Mirapex at current dose for restless legs will refill Follow-up with Dr. Pearlean Brownie in 1 year Nilda Riggs, [facility], [facility], APRN  [facility] [address] [place], [place] [address] [phone]

## 2017-12-09 ENCOUNTER — Ambulatory Visit: Payer: Medicare Other | Admitting: Nurse Practitioner

## 2017-12-09 ENCOUNTER — Telehealth: Payer: Self-pay | Admitting: *Deleted

## 2017-12-09 NOTE — Telephone Encounter (Signed)
Pt no showed appt today

## 2017-12-10 ENCOUNTER — Encounter: Payer: Self-pay | Admitting: Nurse Practitioner

## 2017-12-11 ENCOUNTER — Other Ambulatory Visit (INDEPENDENT_AMBULATORY_CARE_PROVIDER_SITE_OTHER): Payer: Medicare Other

## 2017-12-11 ENCOUNTER — Encounter: Payer: Self-pay | Admitting: Pulmonary Disease

## 2017-12-11 ENCOUNTER — Ambulatory Visit (INDEPENDENT_AMBULATORY_CARE_PROVIDER_SITE_OTHER): Payer: Medicare Other | Admitting: Pulmonary Disease

## 2017-12-11 VITALS — BP 130/78 | HR 78 | Ht 67.5 in | Wt 199.0 lb

## 2017-12-11 DIAGNOSIS — Z0000 Encounter for general adult medical examination without abnormal findings: Secondary | ICD-10-CM | POA: Diagnosis not present

## 2017-12-11 DIAGNOSIS — Z5181 Encounter for therapeutic drug level monitoring: Secondary | ICD-10-CM

## 2017-12-11 DIAGNOSIS — J9611 Chronic respiratory failure with hypoxia: Secondary | ICD-10-CM

## 2017-12-11 DIAGNOSIS — J84112 Idiopathic pulmonary fibrosis: Secondary | ICD-10-CM | POA: Diagnosis not present

## 2017-12-11 LAB — COMPREHENSIVE METABOLIC PANEL
ALT: 22 U/L (ref 0–53)
AST: 29 U/L (ref 0–37)
Albumin: 3.8 g/dL (ref 3.5–5.2)
Alkaline Phosphatase: 66 U/L (ref 39–117)
BILIRUBIN TOTAL: 0.9 mg/dL (ref 0.2–1.2)
BUN: 14 mg/dL (ref 6–23)
CO2: 29 mEq/L (ref 19–32)
Calcium: 9.3 mg/dL (ref 8.4–10.5)
Chloride: 97 mEq/L (ref 96–112)
Creatinine, Ser: 0.98 mg/dL (ref 0.40–1.50)
GFR: 78.78 mL/min (ref >60.00)
GLUCOSE: 99 mg/dL (ref 70–99)
POTASSIUM: 4.2 meq/L (ref 3.5–5.1)
SODIUM: 132 meq/L — AB (ref 135–145)
TOTAL PROTEIN: 7.5 g/dL (ref 6.0–8.3)

## 2017-12-11 LAB — LIPID PANEL
Cholesterol: 125 mg/dL (ref 0–200)
HDL: 64.5 mg/dL (ref >39.00)
LDL Cholesterol: 50 mg/dL (ref 0–99)
NONHDL: 60.58
Total CHOL/HDL Ratio: 2
Triglycerides: 51 mg/dL (ref 0.0–149.0)
VLDL: 10.2 mg/dL (ref 0.0–40.0)

## 2017-12-11 MED ORDER — PREDNISONE 10 MG PO TABS: ORAL_TABLET | ORAL | 0 refills | Status: DC

## 2017-12-11 MED ORDER — FUROSEMIDE 40 MG PO TABS: 40.0000 mg | ORAL_TABLET | Freq: Every day | ORAL | 0 refills | Status: DC

## 2017-12-11 NOTE — Progress Notes (Signed)
Subjective:    Patient ID: Jeffrey Cobb, male    DOB: 1941-08-20, 77 y.o.   MRN: [id number]  Synopsis: Former patient of Dr. 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 Follow-up    Today having more SOB, nonproductive cough   On 5L pulse his O2 saturation was in the 80's.  [name] just happened to schedule today's appointment early and he is feeling worse today. He says taht the oxygen has been more of a problem for him lately.  He doesn't feel like the oxygen is keeping.  No new cough, cold.  No fevers, no chills.  His mouth is dry all the time.  He has been trying to stay active and do his regular activity.   He was out with friends last night and ate dinner.  He felt fine. For the last 10 days or so he feels like the oxygen isn't keeping up.  He is not swelling more than normal.    Past Medical History:  Diagnosis Date  . Acute sinusitis, unspecified   . Arthritis   . 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 GERD (gastroesophageal reflux disease)   . Left anterior fascicular block    hx of on ekg  . Mixed hyperlipidemia   . Obstructive chronic bronchitis with exacerbation (HCC)   . Occlusion and stenosis of carotid artery without mention of cerebral infarction    a. 05/2013 Carotid U/S: 1-39% bilat stenosis.  . Other emphysema (HCC)   . 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 Sleep apnea    does not use cpap due to cough  . Stroke [facility]) 05/2015      Review of Systems  Constitutional: Negative for chills, fatigue and fever.  HENT: Positive for rhinorrhea. Negative for sinus pressure and sneezing.   Respiratory: Positive for cough. Negative for shortness of breath and wheezing.   Cardiovascular:  Negative for chest pain, palpitations and leg swelling.       Objective:   Physical Exam Vitals:   12/11/17 0939 12/11/17 0940  BP:  130/78  Pulse:  78  SpO2:  92%  Weight: 199 lb (90.3 kg)   Height: 5' 7.5" (1.715 m)    3L pm pulse  Gen: chronicall ill appearing HENT: OP clear, TM's clear, neck supple PULM: Crackles bases B, normal percussion CV: RRR, no mgr, trace edema GI: BS+, soft, nontender Derm: no cyanosis or rash Psyche: normal mood and affect    CBC    Component Value Date/Time   WBC 6.7 05/31/2015 1730   RBC 4.64 05/31/2015 1730   HGB 14.6 05/31/2015 1730   HCT 41.5 05/31/2015 1730   PLT 138 (L) 05/31/2015 1730   MCV 89.4 05/31/2015 1730   MCH 31.5 05/31/2015 1730   MCHC 35.2 05/31/2015 1730   RDW 13.4 05/31/2015 1730   LYMPHSABS 2.2 09/02/2014 1204   MONOABS 0.7 09/02/2014 1204   EOSABS 0.2 09/02/2014 1204   BASOSABS 0.0 09/02/2014 1204     PFT PFT"s 2009:  FEV1 3.49 (115%), ratio 65, DLCO 67%. PFT's 08/2014:  FEV1 3.13 (114%), ratio 69, decreased airtrapping from 2009, DLCO 49% June 2017 pulmonary function testing FEV1 2.99 L (111% predicted,  FVC 3.67 L (98% predicted), total lung capacity 5.53 L (85% predicted), DLCO 12.76 (45% predicted). March 2018 pulmonary function testing ratio 80%, FVC 2.94 L 79% predicted, total lung capacity 4.51 L 69% predicted, DLCO 9.05 31% predicted 05/2017 PFT> FVC 2.6L, DLCO 7.1 24% pred  Imaging: 01/2016 HRCT > traction bronchiolectasis and interlobular septal thickening in a basilar predominance have progressed compared to July 2016, there remains no frank honeycombing. There are multiple pulmonary nodules which are stable or have resolved. February 2018 high-resolution CT chest independently reviewed showing traction bronchiectasis, interlobular septal thickening and honeycombing worse in the periphery and bases.  6 minute walk test: August 2016 6 minute walk test 336 m O2 sats ration 92% on room air 04/27/2016 6  minute walk distance 292.5 m O2 sats ration and completion of test was 82% on room air. July 2018 6 minute walk: Distance 1231 feet, O2 saturation 88% on 10 L nasal cannula October 2018 distance 216 m  Other testing: September 2017 pH probe showed normal pH and no clear evidence of gastroesophageal reflux.  2018 echocardiogram normal LVEF, RVSP 38  CPAP compliance report 02/2017> only using 39% of nights       Assessment & Plan:  Chronic respiratory failure with hypoxia (HCC)  IPF (idiopathic pulmonary fibrosis) (HCC)  Discussion: I am very worried about [name].  He is much worse today without a clear sign or symptom of an acute problem which caused it.  I am concerned that this represents rapid worsening of his IPF.  I explained this to him today.  We will treat him as if he has a flare and we will treat for pulmonary edema with diuretics but I explained to him that it is very possible this may not help.  I explained him that I think he is probably near the point of needing hospice.  I am hopeful that the interventions we make today will help but if they do not then he needs to be enrolled in hospice.  He told me today that he is very clear that he does not want a prolonged hospitalization he does not want aggressive measures and he does not want his family to have to care for him in his home.  He would prefer to die in a healthcare type environment like an inpatient hospice facility.  Plan: Idiopathic pulmonary fibrosis with acute flareup: Take prednisone 40 mg daily for a week, then 20 mg daily for a week Continue taking your antibiotic therapy as directed, we will check lab work for therapeutic drug monitoring  Chronic respiratory failure with hypoxemia: Use 6 L continuous at rest, 10 L with any exertion Take it easy over the next few days  Hyperlipidemia: We will check a lipid panel for your primary care physician  As we discussed today, if your symptoms worsen then  it would be appropriate for you to consider hospice, we can help facilitate that without an office visit, just call us if symptoms are worsening.  Follow up 2 weeks  > 50% of this 28 minute visit spent face to face      Current Outpatient Medications:  .  albuterol (PROVENTIL) (2.5 MG/3ML) 0.083% nebulizer solution, Take 3 mLs (2.5 mg total) by nebulization every 6 (six) hours as needed for wheezing or shortness of breath., Disp: 360 mL, Rfl: 11 .  aspirin 81 MG tablet, Take 81 mg by mouth daily., Disp: , Rfl:  .  atorvastatin (LIPITOR) 40 MG tablet, Take 1 tablet (40  mg total) by mouth daily at 6 PM., Disp: 30 tablet, Rfl: 0 .  chlorpheniramine-HYDROcodone (TUSSIONEX PENNKINETIC ER) 10-8 MG/5ML SUER, Take 5 mLs by mouth every 12 (twelve) hours as needed for cough., Disp: 140 mL, Rfl: 0 .  cyanocobalamin 500 MCG tablet, Take 1,000 mcg by mouth daily., Disp: , Rfl:  .  doxycycline (VIBRA-TABS) 100 MG tablet, Take 1 tablet (100 mg total) by mouth 2 (two) times daily., Disp: 10 tablet, Rfl: 0 .  escitalopram (LEXAPRO) 10 MG tablet, Take 1/2 tab daily for 1 week and than full tab daily, Disp: 30 tablet, Rfl: 0 .  furosemide (LASIX) 40 MG tablet, Take daily as needed for leg swelling., Disp: 15 tablet, Rfl: 1 .  guaiFENesin (MUCINEX) 600 MG 12 hr tablet, Take 1 tablet (600 mg total) by mouth 2 (two) times daily., Disp: 1 tablet, Rfl: 3 .  Nintedanib (OFEV) 150 MG CAPS, Take 150 mg by mouth 2 (two) times daily., Disp: 60 capsule, Rfl:  .  oxymetazoline (AFRIN) 0.05 % nasal spray, Place 1 spray into both nostrils as needed for congestion., Disp: , Rfl:  .  pramipexole (MIRAPEX) 0.5 MG tablet, TAKE 1 TABLET BY MOUTH EVERY MORNING AND TAKE 1 TABLET EVERY EVENING, Disp: 180 tablet, Rfl: 3 .  PROAIR HFA 108 (90 BASE) MCG/ACT inhaler, Inhale 2 puffs into the lungs every 6 (six) hours as needed., Disp: , Rfl:  .  QUEtiapine (SEROQUEL) 100 MG tablet, , Disp: , Rfl:  .  sodium chloride HYPERTONIC 3 %  nebulizer solution, Take by nebulization 2 (two) times daily as needed for other., Disp: 224 mL, Rfl: 12 .  triamcinolone cream (KENALOG) 0.1 %, APPLY 1 APPLICATION ON THE SKIN TWICE A DAY AS NEEDED, Disp: , Rfl: 2

## 2017-12-11 NOTE — Addendum Note (Signed)
Addended by: Sylvester HarderMOORE, Lux Skilton R on: 12/11/2017 10:17 AM   Modules accepted: Orders

## 2017-12-11 NOTE — Addendum Note (Signed)
Addended by: Sylvester HarderMOORE, Dael Howland R on: 12/11/2017 02:23 PM   Modules accepted: Orders

## 2017-12-11 NOTE — Progress Notes (Signed)
   IPF PRO Registry Purpose: To collect data and biological samples that will support future research studies.  Registry will describe current approaches to diagnosis and treatment of IPF, analyze participant characteristics to describe the natural history of the disease, assess quality of life, describe participants interactions with the health care system, describe IPF treatment practices across multiple institutions, and utilize biological samples linked to well characterized IPF participants to identify disease biomarkers.   Clinical Research Coordinator / Research RN note : This visit for Subject Jeffrey Cobb with DOB: May 07, 1941 on 12/11/2017 for the above protocol is Visit/Encounter # 12 month follow up  and is for purpose of Research . The consent for this encounter is under Protocol Version Amendment 2 and  IS currently IRB approved. Subject expressed continued interest and consent in continuing as a study subject. Subject confirmed that there was no change in contact information (e.g. address, telephone, email). Subject thanked for participation in research and contribution to science.   In today's visit 12/11/2017 the subject completed all required questionnaires and blood draws as per outlined in above mentioned protocol. Subject will return in approximately 6 months for his 18 month assessment. Please refer to patients paper source binder for more details regarding today's visit.   Signed by  T. Cherly Hensenameron Mosley BS, [facility]  Clinical Research Coordinator I [place]  , [place] 1:44 [place] 12/11/2017

## 2017-12-11 NOTE — Patient Instructions (Signed)
Idiopathic pulmonary fibrosis with acute flareup: Take prednisone 40 mg daily for a week, then 20 mg daily for a week Continue taking your antibiotic therapy as directed, we will check lab work for therapeutic drug monitoring  Chronic respiratory failure with hypoxemia: Use 6 L continuous at rest, 10 L with any exertion Take it easy over the next few days  Hyperlipidemia: We will check a lipid panel for your primary care physician  As we discussed today, if your symptoms worsen then it would be appropriate for you to consider hospice, we can help facilitate that without an office visit, just call us if symptoms are worsening.  Follow up 2 weeks

## 2017-12-17 ENCOUNTER — Ambulatory Visit: Payer: Medicare Other | Admitting: Adult Health

## 2017-12-24 ENCOUNTER — Ambulatory Visit: Payer: Medicare Other | Admitting: Adult Health

## 2017-12-25 ENCOUNTER — Ambulatory Visit (INDEPENDENT_AMBULATORY_CARE_PROVIDER_SITE_OTHER): Payer: Medicare Other | Admitting: Psychiatry

## 2017-12-25 ENCOUNTER — Ambulatory Visit (INDEPENDENT_AMBULATORY_CARE_PROVIDER_SITE_OTHER): Payer: Medicare Other | Admitting: Acute Care

## 2017-12-25 ENCOUNTER — Other Ambulatory Visit: Payer: Self-pay

## 2017-12-25 ENCOUNTER — Encounter (HOSPITAL_COMMUNITY): Payer: Self-pay | Admitting: Psychiatry

## 2017-12-25 ENCOUNTER — Ambulatory Visit (INDEPENDENT_AMBULATORY_CARE_PROVIDER_SITE_OTHER)
Admission: RE | Admit: 2017-12-25 | Discharge: 2017-12-25 | Disposition: A | Discharge status: Home / Self Care | Payer: Medicare Other | Source: Ambulatory Visit | Attending: Acute Care | Admitting: Acute Care

## 2017-12-25 ENCOUNTER — Encounter: Payer: Self-pay | Admitting: Acute Care

## 2017-12-25 ENCOUNTER — Other Ambulatory Visit (INDEPENDENT_AMBULATORY_CARE_PROVIDER_SITE_OTHER): Payer: Medicare Other

## 2017-12-25 VITALS — BP 140/82 | HR 78 | Ht 67.5 in | Wt 206.4 lb

## 2017-12-25 DIAGNOSIS — J84112 Idiopathic pulmonary fibrosis: Secondary | ICD-10-CM

## 2017-12-25 DIAGNOSIS — Z9989 Dependence on other enabling machines and devices: Secondary | ICD-10-CM

## 2017-12-25 DIAGNOSIS — Z5181 Encounter for therapeutic drug level monitoring: Secondary | ICD-10-CM

## 2017-12-25 DIAGNOSIS — F411 Generalized anxiety disorder: Secondary | ICD-10-CM | POA: Diagnosis not present

## 2017-12-25 DIAGNOSIS — F331 Major depressive disorder, recurrent, moderate: Secondary | ICD-10-CM | POA: Diagnosis not present

## 2017-12-25 DIAGNOSIS — G4733 Obstructive sleep apnea (adult) (pediatric): Secondary | ICD-10-CM

## 2017-12-25 DIAGNOSIS — Z79899 Other long term (current) drug therapy: Secondary | ICD-10-CM | POA: Diagnosis not present

## 2017-12-25 DIAGNOSIS — Z87891 Personal history of nicotine dependence: Secondary | ICD-10-CM | POA: Diagnosis not present

## 2017-12-25 DIAGNOSIS — Z0000 Encounter for general adult medical examination without abnormal findings: Secondary | ICD-10-CM

## 2017-12-25 DIAGNOSIS — Z818 Family history of other mental and behavioral disorders: Secondary | ICD-10-CM

## 2017-12-25 DIAGNOSIS — Z7189 Other specified counseling: Secondary | ICD-10-CM | POA: Diagnosis not present

## 2017-12-25 LAB — CBC WITH DIFFERENTIAL/PLATELET
BASOS ABS: 0.1 10*3/uL (ref 0.0–0.1)
Basophils Relative: 0.7 % (ref 0.0–3.0)
EOS PCT: 1.2 % (ref 0.0–5.0)
Eosinophils Absolute: 0.1 10*3/uL (ref 0.0–0.7)
HCT: 42.2 % (ref 39.0–52.0)
Hemoglobin: 14.2 g/dL (ref 13.0–17.0)
LYMPHS ABS: 1.1 10*3/uL (ref 0.7–4.0)
Lymphocytes Relative: 9.7 % — ABNORMAL LOW (ref 12.0–46.0)
MCHC: 33.6 g/dL (ref 30.0–36.0)
MCV: 94.6 fl (ref 78.0–100.0)
MONOS PCT: 4.6 % (ref 3.0–12.0)
Monocytes Absolute: 0.5 10*3/uL (ref 0.1–1.0)
NEUTROS PCT: 83.8 % — AB (ref 43.0–77.0)
Neutro Abs: 9.9 10*3/uL — ABNORMAL HIGH (ref 1.4–7.7)
Platelets: 131 10*3/uL — ABNORMAL LOW (ref 150.0–400.0)
RBC: 4.46 Mil/uL (ref 4.22–5.81)
RDW: 15.8 % — ABNORMAL HIGH (ref 11.5–15.5)
WBC: 11.8 10*3/uL — AB (ref 4.0–10.5)

## 2017-12-25 LAB — MICROALBUMIN / CREATININE URINE RATIO
Creatinine,U: 68.9 mg/dL
MICROALB UR: 2.6 mg/dL — AB (ref 0.0–1.9)
Microalb Creat Ratio: 3.8 mg/g (ref 0.0–30.0)

## 2017-12-25 LAB — BASIC METABOLIC PANEL
BUN: 11 mg/dL (ref 6–23)
CO2: 29 mEq/L (ref 19–32)
Calcium: 8.8 mg/dL (ref 8.4–10.5)
Chloride: 99 mEq/L (ref 96–112)
Creatinine, Ser: 0.91 mg/dL (ref 0.40–1.50)
GFR: 85.81 mL/min (ref >60.00)
Glucose, Bld: 100 mg/dL — ABNORMAL HIGH (ref 70–99)
POTASSIUM: 4.3 meq/L (ref 3.5–5.1)
SODIUM: 134 meq/L — AB (ref 135–145)

## 2017-12-25 MED ORDER — ESCITALOPRAM OXALATE 20 MG PO TABS: 20.0000 mg | ORAL_TABLET | Freq: Every day | ORAL | 0 refills | Status: AC

## 2017-12-25 MED ORDER — PREDNISONE 5 MG PO TABS: ORAL_TABLET | ORAL | 0 refills | Status: DC

## 2017-12-25 MED ORDER — HYDROCOD POLST-CPM POLST ER 10-8 MG/5ML PO SUER: 5.0000 mL | Freq: Two times a day (BID) | ORAL | 0 refills | Status: AC | PRN

## 2017-12-25 NOTE — Assessment & Plan Note (Signed)
Pt. Has decided he would like referral to Hospice Plan: Hospice referral today

## 2017-12-25 NOTE — Progress Notes (Signed)
History of Present Illness CAGE Jeffrey Cobb is a 77 y.o. male former smoker ( Quit 1979 with a 51 pack year smoking history)with Idiopathic pulmonary fibrosis . He  is on home oxygen at 6 L at rest and 10 L with exertion and Ofev. He  is followed by Dr. Kendrick Fries.    12/25/2017 2 week follow up/ Acute OV: Pt is here for follow up for acute flare of his  IPF. She was seen by Dr. Kendrick Fries 12/11/2017. Plan after that visit was as follows:  Idiopathic pulmonary fibrosis with acute flareup: Take prednisone 40 mg daily for a week, then 20 mg daily for a week Continue taking your antibiotic therapy as directed, we will check lab work for therapeutic drug monitoring  Chronic respiratory failure with hypoxemia: Use 6 L continuous at rest, 10 L with any exertion Take it easy over the next few days  Hyperlipidemia: We will check a lipid panel for your primary care physician  Additionally Dr. Kendrick Fries spoke with the patient about hospice care if his  symptoms continued to worsen.  Pt. Presents today for follow up after treatment with prednisone for flare of IPF. He did complete therapy of his doxycycline and prednisone. He states he did breath better on the prednisone.This is an acute visit for drop in saturations after walking to his psychiatrist's office. Per his wife, he had to walk a long distance to the therapists office. His sats dropped to 57%, and blood pressure was ok. He was on pulsed oxygen at 5 L at the time. He got gray and ashen. They called the office to fit him in for an acute visit. When he arrived he was sating 87%, he quickly rebounded to 94% after being placed on continuous flow oxygen at 6L. Marland KitchenHe states he did better on the prednisone, and after it stopped he got more short of breath.He states he has no allergies. He denies any post nasal gtt. He does have nasal congestion, and uses Afrin prn.He is compliant with Ofev and Mucinex.. He had LFT's and BMET checked 2 weeks ago for therapeutic  drug monitoring. LFT's were normal.He is not taking his lasix 40 mg daily as is prescribed. He states he did not feel it was really helping him so he stopped taking it. He has 2 + BLE edema.He denies fever,  chest pain, or hemoptysis.He is better now after continuous oxygen at 6 L. Sats are 97%, HR 76 and regular.Sats were 87% upon arrival to the office on 5 L pulsed oxygen. This should meet criteria for continuous oxygen therapy.    Test Results: 12/25/2017>> CXR The heart size and mediastinal contours are within normal limits. Hypoinflation of the lungs is noted with mild bibasilar subsegmental atelectasis. No pneumothorax or pleural effusion is noted. Some degree of chronic interstitial lung disease is noted as well. Status post bilateral shoulder arthroplasties.  IMPRESSION: Hypoinflation of the lungs is noted with mild bibasilar subsegmental atelectasis superimposed upon chronic interstitial lung disease.  12/25/2017 CBC Leukocytosis most likely 2/2 recent steroid treatment. CBC    Component Value Date/Time   WBC 11.8 (H) 12/25/2017 1254   RBC 4.46 12/25/2017 1254   HGB 14.2 12/25/2017 1254   HCT 42.2 12/25/2017 1254   PLT 131.0 (L) 12/25/2017 1254   MCV 94.6 12/25/2017 1254   MCH 31.5 05/31/2015 1730   MCHC 33.6 12/25/2017 1254   RDW 15.8 (H) 12/25/2017 1254   LYMPHSABS 1.1 12/25/2017 1254   MONOABS 0.5 12/25/2017 1254  EOSABS 0.1 12/25/2017 1254   BASOSABS 0.1 12/25/2017 1254     12/25/2017: BMET  BMET    Component Value Date/Time   NA 134 (L) 12/25/2017 1254   K 4.3 12/25/2017 1254   CL 99 12/25/2017 1254   CO2 29 12/25/2017 1254   GLUCOSE 100 (H) 12/25/2017 1254   BUN 11 12/25/2017 1254   CREATININE 0.91 12/25/2017 1254   CREATININE 1.13 10/06/2015 0857   CALCIUM 8.8 12/25/2017 1254   GFRNONAA >60 06/01/2015 2038   GFRAA >60 06/01/2015 2038   CBC Latest Ref Rng & Units 12/25/2017 05/31/2015 09/02/2014  WBC 4.0 - 10.5 K/uL 11.8(H) 6.7 8.2  Hemoglobin 13.0 -  17.0 g/dL 16.1 09.6 04.5  Hematocrit 39.0 - 52.0 % 42.2 41.5 44.6  Platelets 150.0 - 400.0 K/uL 131.0(L) 138(L) 139.0(L)    BMP Latest Ref Rng & Units 12/25/2017 12/11/2017 06/11/2017  Glucose 70 - 99 mg/dL 409(W) 99 119(J)  BUN 6 - 23 mg/dL Creatinine 0.40 - 1.50 mg/dL 4.78 2.95 6.21  Sodium 135 - 145 mEq/L 134(L) 132(L) 133(L)  Potassium 3.5 - 5.1 mEq/L 4.3 4.2 4.0  Chloride 96 - 112 mEq/L 99 97 98  CO2 19 - 32 mEq/L Calcium 8.4 - 10.5 mg/dL 8.8 9.3 9.2    BNP    Component Value Date/Time   BNP 45.6 06/01/2015 0710    ProBNP    Component Value Date/Time   PROBNP 32.0 08/13/2017 1455    PFT    Component Value Date/Time   FEV1PRE 2.12 06/11/2017 0846   FEV1POST 2.17 06/11/2017 0846   FVCPRE 2.64 06/11/2017 0846   FVCPOST 2.60 06/11/2017 0846   TLC 3.97 06/11/2017 0846   DLCOUNC 7.07 06/11/2017 0846   PREFEV1FVCRT 80 06/11/2017 0846   PSTFEV1FVCRT 84 06/11/2017 0846    Dg Chest 2 View  Result Date: 12/25/2017 CLINICAL DATA:  Dyspnea. EXAM: CHEST - 2 VIEW COMPARISON:  Radiographs of June 11, 2017. FINDINGS: The heart size and mediastinal contours are within normal limits. Hypoinflation of the lungs is noted with mild bibasilar subsegmental atelectasis or scarring. No pneumothorax or pleural effusion is noted. Status post bilateral shoulder arthroplasties. IMPRESSION: Hypoinflation of the lungs with mild bibasilar subsegmental atelectasis or scarring. Electronically Signed   By: Lupita Raider, M.D.   On: 12/25/2017 12:57     Past medical hx Past Medical History:  Diagnosis Date  . Acute sinusitis, unspecified   . Arthritis   . 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 GERD (gastroesophageal reflux disease)   . Left anterior fascicular block    hx of on ekg  . Mixed hyperlipidemia   . Obstructive chronic bronchitis with exacerbation (HCC)   . Occlusion and stenosis of carotid artery without mention of  cerebral infarction    a. 05/2013 Carotid U/S: 1-39% bilat stenosis.  . Other emphysema (HCC)   . 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 Sleep apnea    does not use cpap due to cough  . Stroke [facility]) 05/2015     Social History   Tobacco Use  . Smoking status: Former Smoker    Packs/day: 3.00    Years: 17.00    Pack years: 51.00    Types: Cigarettes    Last attempt to quit: 08/26/1977    Years since quitting: 40.3  . Smokeless tobacco: Never Used  Substance Use Topics  .  Alcohol use: Yes    Comment: rarely  . Drug use: No    Mr.Sugrue reports that he quit smoking about 40 years ago. His smoking use included cigarettes. He has a 51.00 pack-year smoking history. He has never used smokeless tobacco. He reports that he drinks alcohol. He reports that he does not use drugs.  Tobacco Cessation: Former smoker quit 1979 with a 51 pack year smoking history  Past surgical hx, Family hx, Social hx all reviewed.  Current Outpatient Medications on File Prior to Visit  Medication Sig  . albuterol (PROVENTIL) (2.5 MG/3ML) 0.083% nebulizer solution Take 3 mLs (2.5 mg total) by nebulization every 6 (six) hours as needed for wheezing or shortness of breath.  Marland Kitchen aspirin 81 MG tablet Take 81 mg by mouth daily.  Marland Kitchen atorvastatin (LIPITOR) 40 MG tablet Take 1 tablet (40 mg total) by mouth daily at 6 PM.  . escitalopram (LEXAPRO) 20 MG tablet Take 1 tablet (20 mg total) by mouth daily.  Marland Kitchen guaiFENesin (MUCINEX) 600 MG 12 hr tablet Take 1 tablet (600 mg total) by mouth 2 (two) times daily.  . Nintedanib (OFEV) 150 MG CAPS Take 150 mg by mouth 2 (two) times daily.  Marland Kitchen oxymetazoline (AFRIN) 0.05 % nasal spray Place 1 spray into both nostrils as needed for congestion.  . pramipexole (MIRAPEX) 0.5 MG tablet TAKE 1 TABLET BY MOUTH EVERY MORNING AND TAKE 1 TABLET EVERY EVENING  . PROAIR HFA 108 (90 BASE) MCG/ACT inhaler Inhale 2 puffs into the lungs every 6 (six) hours as  needed.  Marland Kitchen QUEtiapine (SEROQUEL) 100 MG tablet   . sodium chloride HYPERTONIC 3 % nebulizer solution Take by nebulization 2 (two) times daily as needed for other.  . triamcinolone cream (KENALOG) 0.1 % APPLY 1 APPLICATION ON THE SKIN TWICE A DAY AS NEEDED  . chlorpheniramine-HYDROcodone (TUSSIONEX PENNKINETIC ER) 10-8 MG/5ML SUER Take 5 mLs by mouth every 12 (twelve) hours as needed for cough. (Patient not taking: Reported on 12/25/2017)  . cyanocobalamin 500 MCG tablet Take 1,000 mcg by mouth daily.  Marland Kitchen doxycycline (VIBRA-TABS) 100 MG tablet Take 1 tablet (100 mg total) by mouth 2 (two) times daily. (Patient not taking: Reported on 12/25/2017)  . furosemide (LASIX) 40 MG tablet Take daily as needed for leg swelling. (Patient not taking: Reported on 12/25/2017)  . furosemide (LASIX) 40 MG tablet Take 1 tablet (40 mg total) by mouth daily. (Patient not taking: Reported on 12/25/2017)  . predniSONE (DELTASONE) 10 MG tablet  x7days,20mg  x 7 days. Then stop. (Patient not taking: Reported on 12/25/2017)   No current facility-administered medications on file prior to visit.      Allergies  Allergen Reactions  . Gabapentin     Dizziness   . Sulfonamide Derivatives Rash  . Trazodone And Nefazodone Rash and Other (See Comments)    Hallucinations     Review Of Systems:  Constitutional:   No  weight loss, night sweats,  Fevers, chills,+ fatigue, or  lassitude.  HEENT:   No headaches,  Difficulty swallowing,  Tooth/dental problems, or  Sore throat,                No sneezing, itching, ear ache, nasal congestion, post nasal drip,   CV:  No chest pain,  Orthopnea, PND,+ swelling in lower extremities, anasarca, dizziness, palpitations, syncope.   GI  No heartburn, indigestion, abdominal pain, nausea, vomiting, diarrhea, change in bowel habits, loss of appetite, bloody stools.   Resp: + chronic  shortness of  breath with exertion and  at rest.  No excess mucus, no productive cough,  No non-productive  cough,  No coughing up of blood.  No change in color of mucus.  No wheezing.  No chest wall deformity  Skin: no rash or lesions.  GU: no dysuria, change in color of urine, no urgency or frequency.  No flank pain, no hematuria   MS:  No joint pain or swelling.  No decreased range of motion.  No back pain.  Psych:  No change in mood or affect. No depression + anxiety.  No memory loss.   Vital Signs BP 140/82 (BP Location: Right Arm, Cuff Size: Normal)   Pulse 78   Ht 5' 7.5" (1.715 m)   Wt 206 lb 6.4 oz (93.6 kg)   SpO2 97%   BMI 31.85 kg/m    Physical Exam:  General- No distress,  A&Ox3, pleasant frail male in wheelchair wearing nasal oxygen ENT: No sinus tenderness, TM clear, pale nasal mucosa, no oral exudate,no post nasal drip, no LAN Cardiac: S1, S2, regular rate and rhythm, no murmur Chest: No wheeze/ + Crackles  Bilaterally 3/4 way up./ No  dullness; + accessory muscle use, no nasal flaring, no sternal retractions Abd.: Soft Non-tender, ND, obese Ext: No clubbing cyanosis, 2 + BLE edema Neuro:  MAE x 4. A&O x 3, Deconditioned at baseline Skin: No rashes, warm and dry Psych: anxious, depressed.   Assessment/Plan  IPF (idiopathic pulmonary fibrosis) (HCC) Acute visit for desaturation to 57% at therapists office on 5 L Pulsed oxygen via portable tank. No oxygen on site. Resolved with application of continuous flow oxygen 6 L Saturations rebounded to 97% Plan: CXR today. BMET , CBC today ( Lab) We will call you the results Prednisone taper;10 mg tablets: 20 mg x  7 days, then 10 mg x 7 days then 5 mg x 1 week then stop. We will call [facility] and let them know you need portable  Oxygen tanks continuous flow.  We cannot use the pulsed oxygen at present. Continue wearing oxygen at 6 L at rest and 10 L with exertion continuous flow. Saturation goal is  88%- 94% We will renew Tussionex for cough Continue OFEV as you have been doing. Follow up in 2 weeks to make sure you  are better. We will cancel appointment for tomorrow. Please contact office for sooner follow up if symptoms do not improve or worsen or seek emergency care  We will place referral for Hospice today.   We will renew your lasix prescription today. Please take as prescribed.    OSA on CPAP Per patient, compliant with CPAP Plan: Continue on CPAP at bedtime. You appear to be benefiting from the treatment Goal is to wear for at least 6 hours each night for maximal clinical benefit. Continue to work on weight loss, as the link between excess weight  and sleep apnea is well established.  Do not drive if sleepy. Remember to clean mask, tubing, filter, and reservoir once weekly with soapy water.  Follow up with Dr. Kendrick Fries  In 2 weeks  or before as needed.  Please contact office for sooner follow up if symptoms do not improve or worsen or seek emergency care   Goals of care, counseling/discussion Pt. Has decided he would like referral to Hospice Plan: Hospice referral today    Bevelyn Ngo, NP 12/25/2017  3:24 PM

## 2017-12-25 NOTE — Assessment & Plan Note (Addendum)
Per patient, compliant with CPAP Plan: Continue on CPAP at bedtime. You appear to be benefiting from the treatment Goal is to wear for at least 6 hours each night for maximal clinical benefit. Continue to work on weight loss, as the link between excess weight  and sleep apnea is well established.  Do not drive if sleepy. Remember to clean mask, tubing, filter, and reservoir once weekly with soapy water.  Follow up with Dr. Kendrick Fries  In 2 weeks  or before as needed.  Please contact office for sooner follow up if symptoms do not improve or worsen or seek emergency care

## 2017-12-25 NOTE — Progress Notes (Signed)
O2 sat low, he is going to see his pulmonologist after this visit to address

## 2017-12-25 NOTE — Patient Instructions (Addendum)
It is good to meet you today. CXR today. BMET , CBC today ( Lab) We will call you the results Prednisone taper;10 mg tablets: 20 mg x  7 days, then 10 mg x 7 days then 5 mg x 1 week then stop. We will call [facility] and let them know you need portable  Oxygen tanks continuous flow.  We cannot use the pulsed oxygen at present. Continue wearing oxygen at 6 L at rest and 10 L with exertion continuous flow. Saturation goal is  88%- 94% We will renew Tussionex for cough Continue OFEV as you have been doing. Follow up in 2 weeks to make sure you are better. We will cancel appointment for tomorrow. Please contact office for sooner follow up if symptoms do not improve or worsen or seek emergency care  We will place referral for Hospice today.   We will renew your lasix prescription today. Please take as prescribed.

## 2017-12-25 NOTE — Progress Notes (Signed)
BH MD/PA/NP OP Progress Note  12/25/2017 10:45 AM Jeffrey Cobb  MRN:  [id number]  Chief Complaint: I like new medication.  It is helping my mood.  HPI: Patient came for his wife for his follow-up appointment.  On his last visit we started him on Lexapro.  He is taking Lexapro and he is feeling much better.  He has increased motivation and less depression.  Today he is not doing well and he has shortness of breath and difficulty breathing.  His oxygen saturation is 87.  He is seeing pulmonologist after this appointment.  But he noticed Lexapro helps his thinking and he is less sad and depressed.  He is more social and his attention in consultation is improved.  His appetite is improved.  He is sleeping better.  He denies any crying spells or any racing thoughts.  He has no side effects.  He is no longer taking Cymbalta.  He also started seeing a therapist Sharin Mons at [facility].  Patient has multiple health issues.  He has idiopathic pulmonary fibrosis and is using oxygen.  He lives with his wife who is supportive.  Patient denies any paranoia, hallucination, suicidal thoughts or homicidal thought.  He is wondering if he can increase the dose to 20 mg.  He has mild tremors but no other major side effects.  Visit Diagnosis:    ICD-10-CM   1. MDD (major depressive disorder), recurrent episode, moderate (HCC) F33.1 escitalopram (LEXAPRO) 20 MG tablet    Past Psychiatric History: Reviewed. Patient had history of depression after his first knee surgery in 2001.  He was given amitriptyline which worked very well then stopped working.  After his second knee surgery he was given Zoloft which worked for a while and then stopped working.  His primary care physician started him on Seroquel to help sleep.  In 2018 he was given Cymbalta to help his depression.  We recently increased the dose to 30 mg twice a day with poor outcome.  Patient denies any history of psychosis, hallucination, paranoia or any  suicidal attempt.  Past Medical History:  Past Medical History:  Diagnosis Date  . Acute sinusitis, unspecified   . Arthritis   . 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 GERD (gastroesophageal reflux disease)   . Left anterior fascicular block    hx of on ekg  . Mixed hyperlipidemia   . Obstructive chronic bronchitis with exacerbation (HCC)   . Occlusion and stenosis of carotid artery without mention of cerebral infarction    a. 05/2013 Carotid U/S: 1-39% bilat stenosis.  . Other emphysema (HCC)   . 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 Sleep apnea    does not use cpap due to cough  . Stroke [facility]) 05/2015    Past Surgical History:  Procedure Laterality Date  . APPENDECTOMY  1956  . BRAVO PH STUDY N/A 05/02/2016   Procedure: BRAVO PH STUDY;  Surgeon: Rachael Fee, MD;  Location: [facility];  Service: Endoscopy;  Laterality: N/A;  . ESOPHAGOGASTRODUODENOSCOPY (EGD) WITH PROPOFOL N/A 05/02/2016   Procedure: ESOPHAGOGASTRODUODENOSCOPY (EGD) WITH PROPOFOL;  Surgeon: Rachael Fee, MD;  Location: [facility];  Service: Endoscopy;  Laterality: N/A;  . EYE SURGERY Bilateral    cataract removal  . JOINT REPLACEMENT Right    shoulder replacement  . LEFT KNEE REPLACEMENT  2001  . RIGHT ANKLE RECONSTRUCTION    . RIGHT KNEE REPLACEMENT  2005  . RUPTURED DISK  2000  . SPINE SURGERY  2000/2011  . torn retina Left   . TOTAL SHOULDER ARTHROPLASTY Left 06/30/2013   Procedure: TOTAL SHOULDER ARTHROPLASTY;  Surgeon: Loreta Ave, MD;  Location: [facility] OR;  Service: Orthopedics;  Laterality: Left;    Family Psychiatric History: Reviewed.  Family History:  Family History  Problem Relation Age of Onset  . Emphysema Mother   . Heart disease Mother   . Alzheimer's disease Mother   . Heart disease Father   . Stroke Paternal Grandfather   . Allergies Unknown        FH: FATHER,2 SISTER,1 BROTHER, SON  DAUGHTER  .  Cancer Sister     Social History:  Social History   Socioeconomic History  . Marital status: Married    Spouse name: Myriam Jacobson   . Number of children: 2  . Years of education: Assoc   . Highest education level: Not on file  Occupational History  . Occupation: Retired    Associate Professor: RETIRED  Social Needs  . Financial resource strain: Not hard at all  . Food insecurity:    Worry: Never true    Inability: Never true  . Transportation needs:    Medical: No    Non-medical: No  Tobacco Use  . Smoking status: Former Smoker    Packs/day: 3.00    Years: 17.00    Pack years: 51.00    Types: Cigarettes    Last attempt to quit: 08/26/1977    Years since quitting: 40.3  . Smokeless tobacco: Never Used  Substance and Sexual Activity  . Alcohol use: Yes    Comment: rarely  . Drug use: No  . Sexual activity: Not on file  Lifestyle  . Physical activity:    Days per week: 2 days    Minutes per session: 30 min  . Stress: Not at all  Relationships  . Social connections:    Talks on phone: Three times a week    Gets together: Once a week    Attends religious service: Never    Active member of club or organization: Yes    Attends meetings of clubs or organizations: Never    Relationship status: Married  Other Topics Concern  . Not on file  Social History Narrative   Patient is married Myriam Jacobson) and lives at home with his wife.   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    Patient is left handed   Patient drinks 1-2 cups of coffee in the morning and 3-4 cups of tea daily.                Allergies:  Allergies  Allergen Reactions  . Gabapentin     Dizziness   . Sulfonamide Derivatives Rash  . Trazodone And Nefazodone Rash and Other (See Comments)    Hallucinations     Metabolic Disorder Labs: Lab Results  Component Value Date   HGBA1C 5.7 (H) 06/01/2015   MPG 117 06/01/2015   No results found for: PROLACTIN Lab Results  Component  Value Date   CHOL 125 12/11/2017   TRIG 51.0 12/11/2017   HDL 64.50 12/11/2017   CHOLHDL 2 12/11/2017   VLDL 10.2 12/11/2017   LDLCALC 50 12/11/2017   LDLCALC 103 (H) 06/01/2015   Lab Results  Component Value Date   TSH 2.265 06/01/2015    Therapeutic Level Labs: No results found for: LITHIUM No results found  for: VALPROATE No components found for:  CBMZ  Current Medications: Current Outpatient Medications  Medication Sig Dispense Refill  . albuterol (PROVENTIL) (2.5 MG/3ML) 0.083% nebulizer solution Take 3 mLs (2.5 mg total) by nebulization every 6 (six) hours as needed for wheezing or shortness of breath. 360 mL 11  . aspirin 81 MG tablet Take 81 mg by mouth daily.    Marland Kitchen atorvastatin (LIPITOR) 40 MG tablet Take 1 tablet (40 mg total) by mouth daily at 6 PM. 30 tablet 0  . chlorpheniramine-HYDROcodone (TUSSIONEX PENNKINETIC ER) 10-8 MG/5ML SUER Take 5 mLs by mouth every 12 (twelve) hours as needed for cough. 140 mL 0  . cyanocobalamin 500 MCG tablet Take 1,000 mcg by mouth daily.    Marland Kitchen escitalopram (LEXAPRO) 10 MG tablet Take 1/2 tab daily for 1 week and than full tab daily 30 tablet 0  . guaiFENesin (MUCINEX) 600 MG 12 hr tablet Take 1 tablet (600 mg total) by mouth 2 (two) times daily. 1 tablet 3  . Nintedanib (OFEV) 150 MG CAPS Take 150 mg by mouth 2 (two) times daily. 60 capsule   . oxymetazoline (AFRIN) 0.05 % nasal spray Place 1 spray into both nostrils as needed for congestion.    . pramipexole (MIRAPEX) 0.5 MG tablet TAKE 1 TABLET BY MOUTH EVERY MORNING AND TAKE 1 TABLET EVERY EVENING 180 tablet 3  . PROAIR HFA 108 (90 BASE) MCG/ACT inhaler Inhale 2 puffs into the lungs every 6 (six) hours as needed.    Marland Kitchen QUEtiapine (SEROQUEL) 100 MG tablet     . sodium chloride HYPERTONIC 3 % nebulizer solution Take by nebulization 2 (two) times daily as needed for other. 224 mL 12  . triamcinolone cream (KENALOG) 0.1 % APPLY 1 APPLICATION ON THE SKIN TWICE A DAY AS NEEDED  2  .  doxycycline (VIBRA-TABS) 100 MG tablet Take 1 tablet (100 mg total) by mouth 2 (two) times daily. (Patient not taking: Reported on 12/25/2017) 10 tablet 0  . furosemide (LASIX) 40 MG tablet Take daily as needed for leg swelling. (Patient not taking: Reported on 12/25/2017) 15 tablet 1  . furosemide (LASIX) 40 MG tablet Take 1 tablet (40 mg total) by mouth daily. (Patient not taking: Reported on 12/25/2017) 30 tablet 0  . predniSONE (DELTASONE) 10 MG tablet  x7days,20mg  x 7 days. Then stop. (Patient not taking: Reported on 12/25/2017) 42 tablet 0   No current facility-administered medications for this visit.      Musculoskeletal: Strength & Muscle Tone: decreased Gait & Station: slow walking Patient leans: N/A  Psychiatric Specialty Exam: Review of Systems  Constitutional: Positive for malaise/fatigue.       Tired and exhausted  Respiratory: Positive for shortness of breath.   Neurological: Positive for tremors.    Blood pressure 135/86, pulse 84, temperature 98.4 F (36.9 C), temperature source Oral, resp. rate 16, weight 206 lb 9.6 oz (93.7 kg), SpO2 (!) 87 %.Body mass index is 31.88 kg/m.  General Appearance: tired using oxygen   Eye Contact:  Fair  Speech:  Slow  Volume:  Decreased  Mood:  tired  Affect:  improved  Thought Process:  Goal Directed  Orientation:  Full (Time, Place, and Person)  Thought Content: Rumination   Suicidal Thoughts:  No  Homicidal Thoughts:  No  Memory:  Immediate;   Fair Recent;   Fair Remote;   Fair  Judgement:  Good  Insight:  Good  Psychomotor Activity:  Decreased and Tremor  Concentration:  Concentration: Fair and  Attention Span: Fair  Recall:  Good  Fund of Knowledge: Good  Language: Good  Akathisia:  No  Handed:  Right  AIMS (if indicated): not done  Assets:  Communication Skills Desire for Improvement Housing Resilience Social Support  ADL's:  Intact  Cognition: WNL  Sleep:  Fair   Screenings: PHQ2-9     PULMONARY REHAB OTHER  RESP ORIENTATION from 04/25/2017 in [facility] REH[facility] PULMONARY REHAB OTHER RESPIRATORY from 03/04/2017 in [facility] CARDIAC REHAB PULMONARY REHAB OTHER RESP ORIENTATION from 11/15/2016 in [facility] CARDIAC REHAB  PHQ-2 Total Score  0  0  0       Assessment and Plan: Depressive disorder, recurrent.  Generalized anxiety disorder.  Today patient has difficulty breathing.  He had appointment to see pulmonologist after this appointment.  However he like to Lexapro and wondering if dose can further increase.  I recommended to try 20 mg but if you notice worsening of tremors, dry mouth and if he cannot tolerate the medication then he should go back to 10 mg.  I encouraged to continue therapy.  Discussed medication side effects and benefits.  Recommended to call us back if is any question or any concern.  Follow-up in 3 months.   Cleotis Nipper, MD 12/25/2017, 10:45 AM

## 2017-12-25 NOTE — Assessment & Plan Note (Signed)
Acute visit for desaturation to 57% at therapists office on 5 L Pulsed oxygen via portable tank. No oxygen on site. Resolved with application of continuous flow oxygen 6 L Saturations rebounded to 97% Plan: CXR today. BMET , CBC today ( Lab) We will call you the results Prednisone taper;10 mg tablets: 20 mg x  7 days, then 10 mg x 7 days then 5 mg x 1 week then stop. We will call [facility] and let them know you need portable  Oxygen tanks continuous flow.  We cannot use the pulsed oxygen at present. Continue wearing oxygen at 6 L at rest and 10 L with exertion continuous flow. Saturation goal is  88%- 94% We will renew Tussionex for cough Continue OFEV as you have been doing. Follow up in 2 weeks to make sure you are better. We will cancel appointment for tomorrow. Please contact office for sooner follow up if symptoms do not improve or worsen or seek emergency care  We will place referral for Hospice today.   We will renew your lasix prescription today. Please take as prescribed.

## 2017-12-26 ENCOUNTER — Ambulatory Visit: Payer: Self-pay | Admitting: Adult Health

## 2017-12-30 ENCOUNTER — Telehealth: Payer: Self-pay | Admitting: Acute Care

## 2017-12-30 NOTE — Telephone Encounter (Signed)
Thanks so much [name].  

## 2017-12-30 NOTE — Telephone Encounter (Signed)
Received call from Dr Delanna Notice - spoke with him personally  Per Dr Delanna Notice, pt was referred to Hospice by Maralyn Sago but patient is not eligible for Hospice because is currently enrolled in a study w/ [facility] and is on study drugs.  Rationale for this is because pt is currently undergoing treatment to improve his condition, he does not meet the requirement for enrollment into Hospice.  Patient would like to stay with the study.  Dr Delanna Notice did state that patient is eligible for Palliative Care and pt is okay with proceeding with this.    Did provide Dr [facility] verbal authorization for this as this was the reason for his call.  Routing to Maralyn Sago to make her aware

## 2017-12-31 ENCOUNTER — Telehealth: Payer: Self-pay

## 2017-12-31 NOTE — Telephone Encounter (Signed)
Phone call placed to patient's wife to offer to schedule visit with Palliative Care. VM left. 

## 2018-01-02 ENCOUNTER — Telehealth: Payer: Self-pay

## 2018-01-02 ENCOUNTER — Other Ambulatory Visit: Payer: Self-pay | Admitting: Pulmonary Disease

## 2018-01-02 NOTE — Telephone Encounter (Signed)
Phone call placed to patient's wife, Myriam Jacobson, to schedule visit with Palliative Care. Visit scheduled for 01/08/18 @ 3:00pm

## 2018-01-03 ENCOUNTER — Encounter (HOSPITAL_BASED_OUTPATIENT_CLINIC_OR_DEPARTMENT_OTHER): Payer: Self-pay | Admitting: Emergency Medicine

## 2018-01-03 ENCOUNTER — Emergency Department (HOSPITAL_BASED_OUTPATIENT_CLINIC_OR_DEPARTMENT_OTHER): Payer: Medicare Other

## 2018-01-03 ENCOUNTER — Inpatient Hospital Stay (HOSPITAL_COMMUNITY): Payer: Medicare Other

## 2018-01-03 ENCOUNTER — Other Ambulatory Visit: Payer: Self-pay

## 2018-01-03 ENCOUNTER — Inpatient Hospital Stay (HOSPITAL_BASED_OUTPATIENT_CLINIC_OR_DEPARTMENT_OTHER)
Admission: EM | Admit: 2018-01-03 | Discharge: 2018-01-11 | DRG: 871 | Disposition: A | Discharge status: Hospice | Payer: Medicare Other | Attending: Critical Care Medicine | Admitting: Critical Care Medicine

## 2018-01-03 DIAGNOSIS — Z820 Family history of epilepsy and other diseases of the nervous system: Secondary | ICD-10-CM

## 2018-01-03 DIAGNOSIS — Z66 Do not resuscitate: Secondary | ICD-10-CM | POA: Diagnosis not present

## 2018-01-03 DIAGNOSIS — Z9981 Dependence on supplemental oxygen: Secondary | ICD-10-CM | POA: Diagnosis not present

## 2018-01-03 DIAGNOSIS — R042 Hemoptysis: Secondary | ICD-10-CM | POA: Diagnosis present

## 2018-01-03 DIAGNOSIS — J810 Acute pulmonary edema: Secondary | ICD-10-CM | POA: Diagnosis not present

## 2018-01-03 DIAGNOSIS — J9621 Acute and chronic respiratory failure with hypoxia: Secondary | ICD-10-CM | POA: Diagnosis present

## 2018-01-03 DIAGNOSIS — J9622 Acute and chronic respiratory failure with hypercapnia: Secondary | ICD-10-CM | POA: Diagnosis present

## 2018-01-03 DIAGNOSIS — G2581 Restless legs syndrome: Secondary | ICD-10-CM | POA: Diagnosis present

## 2018-01-03 DIAGNOSIS — A419 Sepsis, unspecified organism: Secondary | ICD-10-CM | POA: Diagnosis present

## 2018-01-03 DIAGNOSIS — R911 Solitary pulmonary nodule: Secondary | ICD-10-CM | POA: Diagnosis present

## 2018-01-03 DIAGNOSIS — Z7982 Long term (current) use of aspirin: Secondary | ICD-10-CM

## 2018-01-03 DIAGNOSIS — J189 Pneumonia, unspecified organism: Secondary | ICD-10-CM | POA: Diagnosis present

## 2018-01-03 DIAGNOSIS — G4733 Obstructive sleep apnea (adult) (pediatric): Secondary | ICD-10-CM | POA: Diagnosis present

## 2018-01-03 DIAGNOSIS — Z515 Encounter for palliative care: Secondary | ICD-10-CM | POA: Diagnosis not present

## 2018-01-03 DIAGNOSIS — J9601 Acute respiratory failure with hypoxia: Secondary | ICD-10-CM

## 2018-01-03 DIAGNOSIS — J9692 Respiratory failure, unspecified with hypercapnia: Secondary | ICD-10-CM

## 2018-01-03 DIAGNOSIS — E876 Hypokalemia: Secondary | ICD-10-CM | POA: Diagnosis present

## 2018-01-03 DIAGNOSIS — R0609 Other forms of dyspnea: Secondary | ICD-10-CM | POA: Diagnosis not present

## 2018-01-03 DIAGNOSIS — E782 Mixed hyperlipidemia: Secondary | ICD-10-CM | POA: Diagnosis present

## 2018-01-03 DIAGNOSIS — Z8249 Family history of ischemic heart disease and other diseases of the circulatory system: Secondary | ICD-10-CM

## 2018-01-03 DIAGNOSIS — R6520 Severe sepsis without septic shock: Secondary | ICD-10-CM | POA: Diagnosis present

## 2018-01-03 DIAGNOSIS — Z96653 Presence of artificial knee joint, bilateral: Secondary | ICD-10-CM | POA: Diagnosis present

## 2018-01-03 DIAGNOSIS — K219 Gastro-esophageal reflux disease without esophagitis: Secondary | ICD-10-CM | POA: Diagnosis present

## 2018-01-03 DIAGNOSIS — Z7189 Other specified counseling: Secondary | ICD-10-CM | POA: Diagnosis not present

## 2018-01-03 DIAGNOSIS — R739 Hyperglycemia, unspecified: Secondary | ICD-10-CM | POA: Diagnosis not present

## 2018-01-03 DIAGNOSIS — Z825 Family history of asthma and other chronic lower respiratory diseases: Secondary | ICD-10-CM

## 2018-01-03 DIAGNOSIS — J449 Chronic obstructive pulmonary disease, unspecified: Secondary | ICD-10-CM | POA: Diagnosis present

## 2018-01-03 DIAGNOSIS — Z8673 Personal history of transient ischemic attack (TIA), and cerebral infarction without residual deficits: Secondary | ICD-10-CM

## 2018-01-03 DIAGNOSIS — I6529 Occlusion and stenosis of unspecified carotid artery: Secondary | ICD-10-CM | POA: Diagnosis present

## 2018-01-03 DIAGNOSIS — Z96612 Presence of left artificial shoulder joint: Secondary | ICD-10-CM | POA: Diagnosis present

## 2018-01-03 DIAGNOSIS — Z87891 Personal history of nicotine dependence: Secondary | ICD-10-CM | POA: Diagnosis not present

## 2018-01-03 DIAGNOSIS — J9691 Respiratory failure, unspecified with hypoxia: Secondary | ICD-10-CM | POA: Diagnosis present

## 2018-01-03 DIAGNOSIS — J84112 Idiopathic pulmonary fibrosis: Secondary | ICD-10-CM | POA: Diagnosis not present

## 2018-01-03 DIAGNOSIS — I5032 Chronic diastolic (congestive) heart failure: Secondary | ICD-10-CM | POA: Diagnosis present

## 2018-01-03 DIAGNOSIS — I110 Hypertensive heart disease with heart failure: Secondary | ICD-10-CM | POA: Diagnosis present

## 2018-01-03 DIAGNOSIS — Z823 Family history of stroke: Secondary | ICD-10-CM

## 2018-01-03 DIAGNOSIS — R0600 Dyspnea, unspecified: Secondary | ICD-10-CM

## 2018-01-03 HISTORY — DX: Pulmonary fibrosis, unspecified: J84.10

## 2018-01-03 LAB — COMPREHENSIVE METABOLIC PANEL
ALBUMIN: 3.3 g/dL — AB (ref 3.5–5.0)
ALK PHOS: 81 U/L (ref 38–126)
ALT: 40 U/L (ref 17–63)
ANION GAP: 14 (ref 5–15)
AST: 59 U/L — ABNORMAL HIGH (ref 15–41)
BILIRUBIN TOTAL: 2.3 mg/dL — AB (ref 0.3–1.2)
BUN: 15 mg/dL (ref 6–20)
CALCIUM: 8.3 mg/dL — AB (ref 8.9–10.3)
CO2: 23 mmol/L (ref 22–32)
Chloride: 94 mmol/L — ABNORMAL LOW (ref 101–111)
Creatinine, Ser: 1.1 mg/dL (ref 0.61–1.24)
GFR calc Af Amer: >60 mL/min (ref >60)
GFR calc non Af Amer: >60 mL/min (ref >60)
GLUCOSE: 148 mg/dL — AB (ref 65–99)
Potassium: 3.5 mmol/L (ref 3.5–5.1)
SODIUM: 131 mmol/L — AB (ref 135–145)
TOTAL PROTEIN: 7.1 g/dL (ref 6.5–8.1)

## 2018-01-03 LAB — I-STAT ARTERIAL BLOOD GAS, ED
Acid-Base Excess: 2 mmol/L (ref 0.0–2.0)
Bicarbonate: 28.1 mmol/L — ABNORMAL HIGH (ref 20.0–28.0)
O2 Saturation: 100 %
PH ART: 7.365 (ref 7.350–7.450)
Patient temperature: 101.5
TCO2: 30 mmol/L (ref 22–32)
pCO2 arterial: 50 mmHg — ABNORMAL HIGH (ref 32.0–48.0)
pO2, Arterial: 226 mmHg — ABNORMAL HIGH (ref 83.0–108.0)

## 2018-01-03 LAB — I-STAT CG4 LACTIC ACID, ED
Lactic Acid, Venous: 3.28 mmol/L (ref 0.5–1.9)
Lactic Acid, Venous: 0.71 mmol/L (ref 0.5–1.9)

## 2018-01-03 LAB — CBC WITH DIFFERENTIAL/PLATELET
BASOS ABS: 0 10*3/uL (ref 0.0–0.1)
BASOS PCT: 0 %
EOS PCT: 1 %
Eosinophils Absolute: 0.1 10*3/uL (ref 0.0–0.7)
HEMATOCRIT: 38.9 % — AB (ref 39.0–52.0)
Hemoglobin: 13.7 g/dL (ref 13.0–17.0)
LYMPHS PCT: 9 %
Lymphs Abs: 0.9 10*3/uL (ref 0.7–4.0)
MCH: 32.1 pg (ref 26.0–34.0)
MCHC: 35.2 g/dL (ref 30.0–36.0)
MCV: 91.1 fL (ref 78.0–100.0)
MONOS PCT: 8 %
Monocytes Absolute: 0.9 10*3/uL (ref 0.1–1.0)
NEUTROS PCT: 82 %
Neutro Abs: 8.6 10*3/uL — ABNORMAL HIGH (ref 1.7–7.7)
PLATELETS: 138 10*3/uL — AB (ref 150–400)
RBC: 4.27 MIL/uL (ref 4.22–5.81)
RDW: 16 % — AB (ref 11.5–15.5)
WBC: 10.5 10*3/uL (ref 4.0–10.5)

## 2018-01-03 LAB — GLUCOSE, CAPILLARY: Glucose-Capillary: 134 mg/dL — ABNORMAL HIGH (ref 65–99)

## 2018-01-03 LAB — PROTIME-INR
INR: 1.1
PROTHROMBIN TIME: 14.1 s (ref 11.4–15.2)

## 2018-01-03 LAB — TROPONIN I: Troponin I: 0.08 ng/mL (ref <0.03)

## 2018-01-03 LAB — PROCALCITONIN: PROCALCITONIN: 0.59 ng/mL

## 2018-01-03 LAB — BRAIN NATRIURETIC PEPTIDE: B NATRIURETIC PEPTIDE 5: 43.6 pg/mL (ref 0.0–100.0)

## 2018-01-03 LAB — MRSA PCR SCREENING: MRSA by PCR: NEGATIVE

## 2018-01-03 MED ORDER — ACETAMINOPHEN 650 MG RE SUPP
650.0000 mg | Freq: Once | RECTAL | Status: AC
  Administered 2018-01-03: 650 mg via RECTAL
  Filled 2018-01-03: qty 1

## 2018-01-03 MED ORDER — CEFEPIME HCL 2 G IJ SOLR
2.0000 g | Freq: Once | INTRAMUSCULAR | Status: AC
Start: 2018-01-03 — End: 2018-01-03
  Administered 2018-01-03: 2 g via INTRAVENOUS

## 2018-01-03 MED ORDER — METHYLPREDNISOLONE SODIUM SUCC 125 MG IJ SOLR
125.0000 mg | Freq: Once | INTRAMUSCULAR | Status: AC
  Administered 2018-01-03: 125 mg via INTRAVENOUS
  Filled 2018-01-03: qty 2

## 2018-01-03 MED ORDER — VANCOMYCIN HCL IN DEXTROSE 1-5 GM/200ML-% IV SOLN
1000.0000 mg | Freq: Two times a day (BID) | INTRAVENOUS | Status: DC
  Administered 2018-01-03 – 2018-01-07 (×9): 1000 mg via INTRAVENOUS
  Filled 2018-01-03 (×10): qty 200

## 2018-01-03 MED ORDER — CEFEPIME HCL 2 G IJ SOLR
INTRAMUSCULAR | Status: AC
  Filled 2018-01-03: qty 2

## 2018-01-03 MED ORDER — FUROSEMIDE 10 MG/ML IJ SOLN
40.0000 mg | Freq: Three times a day (TID) | INTRAMUSCULAR | Status: DC
  Administered 2018-01-03 – 2018-01-05 (×5): 40 mg via INTRAVENOUS
  Filled 2018-01-03 (×7): qty 4

## 2018-01-03 MED ORDER — VANCOMYCIN HCL IN DEXTROSE 1-5 GM/200ML-% IV SOLN
1000.0000 mg | Freq: Once | INTRAVENOUS | Status: AC
  Administered 2018-01-03: 1000 mg via INTRAVENOUS
  Filled 2018-01-03: qty 200

## 2018-01-03 MED ORDER — SODIUM CHLORIDE 0.9 % IV SOLN
1.0000 g | Freq: Three times a day (TID) | INTRAVENOUS | Status: DC
  Administered 2018-01-03 – 2018-01-07 (×12): 1 g via INTRAVENOUS
  Filled 2018-01-03 (×14): qty 1

## 2018-01-03 MED ORDER — SODIUM CHLORIDE 0.9 % IV SOLN
500.0000 mg | Freq: Every day | INTRAVENOUS | Status: DC
  Administered 2018-01-03 – 2018-01-07 (×5): 500 mg via INTRAVENOUS
  Filled 2018-01-03 (×5): qty 500

## 2018-01-03 MED ORDER — SODIUM CHLORIDE 0.9 % IV SOLN: 250.0000 mL | INTRAVENOUS | Status: DC | PRN

## 2018-01-03 MED ORDER — HEPARIN SODIUM (PORCINE) 5000 UNIT/ML IJ SOLN
5000.0000 [IU] | Freq: Three times a day (TID) | INTRAMUSCULAR | Status: DC
  Administered 2018-01-03 – 2018-01-11 (×23): 5000 [IU] via SUBCUTANEOUS
  Filled 2018-01-03 (×27): qty 1

## 2018-01-03 MED ORDER — MAGNESIUM SULFATE 2 GM/50ML IV SOLN
2.0000 g | Freq: Once | INTRAVENOUS | Status: AC
  Administered 2018-01-03: 2 g via INTRAVENOUS
  Filled 2018-01-03: qty 50

## 2018-01-03 MED ORDER — METHYLPREDNISOLONE SODIUM SUCC 125 MG IJ SOLR
60.0000 mg | Freq: Three times a day (TID) | INTRAMUSCULAR | Status: DC
  Administered 2018-01-03 – 2018-01-04 (×2): 60 mg via INTRAVENOUS
  Filled 2018-01-03 (×2): qty 2
  Filled 2018-01-03: qty 0.96

## 2018-01-03 NOTE — ED Notes (Signed)
Care turned over to [facility] for transport.  

## 2018-01-03 NOTE — ED Triage Notes (Signed)
Patient states that he has had SOB since yesterday  - patient is on 8 Liters [place] and 64%   - patient has increased WOB and SOB - patient has blue nail beds. Patient is leaning over in the wheelchair - alert and answering questions. MD aware

## 2018-01-03 NOTE — ED Notes (Signed)
Attempted to call report to the floor. RN was unavailable. Left call back number with unit secretary.

## 2018-01-03 NOTE — ED Provider Notes (Signed)
[facility] EMERGENCY DEPARTMENT Provider Note   CSN: [id number] Arrival date & time: 01/03/18  1606   History   Chief Complaint Chief Complaint  Patient presents with  . Shortness of Breath    HPI Jeffrey Cobb is a 77 y.o. 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 He has had issues with dyspnea on exertion for the last couple of weeks. He was recently treated with a course of Doxycycline and Prednisone starting 12/11/17. He then returned to the pulmonologist's office on 12/25/17 for an acute visit for shortness of breath and was prescribed another course of Prednisone. His shortness of breath initially improved but then worsened two days ago. He gets short of breath just with walking a few steps. He has had subjective fevers starting yesterday evening. He endorses cough and right sided chest pain. Follows with Dr. Kendrick Fries as an outpatient.   Past Medical History:  Diagnosis Date  . Acute sinusitis, unspecified   . Arthritis   . 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 GERD (gastroesophageal reflux disease)   . Left anterior fascicular block    hx of on ekg  . Mixed hyperlipidemia   . Obstructive chronic bronchitis with exacerbation (HCC)   . Occlusion and stenosis of carotid artery without mention of cerebral infarction    a. 05/2013 Carotid U/S: 1-39% bilat stenosis.  . Other emphysema (HCC)   . 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 Sleep apnea    does not use cpap due to cough  . Stroke [facility]) 05/2015    Patient Active Problem List   Diagnosis Date Noted  . Goals of care, counseling/discussion 12/25/2017  . Diffuse idiopathic pulmonary fibrosis (HCC) 09/09/2017  . Supplemental oxygen dependent 09/09/2017  . 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 Hypertension 08/12/2017  . Chronic respiratory failure with hypoxia (HCC) 09/27/2016  . Heartburn   . Dyslipidemia 10/24/2015  . TIA (transient ischemic attack) 05/31/2015  . Acute encephalopathy 05/31/2015  . Aphasia 05/31/2015  . Cough 05/03/2015  . Disease of larynx 05/03/2015  . Laryngopharyngeal reflux 05/03/2015  . IPF (idiopathic pulmonary fibrosis) (HCC) 04/03/2015  . Solitary pulmonary nodule 04/03/2015  . Diastolic dysfunction 10/03/2014  . DOE (dyspnea on exertion) 09/07/2014  . RLS (restless legs syndrome) 04/15/2014  . OSA on CPAP 04/15/2014  . Hypersomnia with sleep apnea 04/15/2014  . Restless legs syndrome (RLS) 01/03/2014  . Disturbance of skin sensation 01/03/2014  . Somnolence, daytime 01/03/2014  . Acute bronchitis 08/31/2013  . Chronic cough 06/26/2011  . PERSISTENT DISORDER INITIATING/MAINTAINING SLEEP 11/08/2010  . HYPERLIPIDEMIA TYPE IIB / III 10/24/2009  . Carotid disease, bilateral (HCC) 10/03/2009    Past Surgical History:  Procedure Laterality Date  . APPENDECTOMY  1956  . BRAVO PH STUDY N/A 05/02/2016   Procedure: BRAVO PH STUDY;  Surgeon: Rachael Fee, MD;  Location: [facility];  Service: Endoscopy;  Laterality: N/A;  . ESOPHAGOGASTRODUODENOSCOPY (EGD) WITH PROPOFOL N/A 05/02/2016   Procedure: ESOPHAGOGASTRODUODENOSCOPY (EGD) WITH PROPOFOL;  Surgeon: Rachael Fee, MD;  Location: [facility];  Service: Endoscopy;  Laterality: N/A;  . EYE SURGERY Bilateral    cataract removal  . JOINT REPLACEMENT Right    shoulder replacement  . LEFT KNEE REPLACEMENT  2001  .  RIGHT ANKLE RECONSTRUCTION    . RIGHT KNEE REPLACEMENT  2005  . RUPTURED DISK  2000  . SPINE SURGERY  2000/2011  . torn retina Left   . TOTAL SHOULDER ARTHROPLASTY Left 06/30/2013   Procedure: TOTAL SHOULDER ARTHROPLASTY;  Surgeon: Loreta Ave, MD;  Location: [facility] OR;  Service: Orthopedics;  Laterality: Left;        Home Medications    Prior to Admission medications    Medication Sig Start Date End Date Taking? Authorizing Provider  albuterol (PROVENTIL) (2.5 MG/3ML) 0.083% nebulizer solution Take 3 mLs (2.5 mg total) by nebulization every 6 (six) hours as needed for wheezing or shortness of breath. 06/20/17   Lupita Leash, MD  aspirin 81 MG tablet Take 81 mg by mouth daily.    [provider]  atorvastatin (LIPITOR) 40 MG tablet Take 1 tablet (40 mg total) by mouth daily at 6 PM. 06/01/15   Ghimire, Werner Lean, MD  chlorpheniramine-HYDROcodone (TUSSIONEX PENNKINETIC ER) 10-8 MG/5ML SUER Take 5 mLs by mouth every 12 (twelve) hours as needed for cough. Patient not taking: Reported on 12/25/2017 11/07/17   Lupita Leash, MD  chlorpheniramine-HYDROcodone [facility] PENNKINETIC ER) 10-8 MG/5ML SUER Take 5 mLs by mouth every 12 (twelve) hours as needed for cough. 12/25/17   Bevelyn Ngo, NP  cyanocobalamin 500 MCG tablet Take 1,000 mcg by mouth daily.    [provider]  doxycycline (VIBRA-TABS) 100 MG tablet Take 1 tablet (100 mg total) by mouth 2 (two) times daily. Patient not taking: Reported on 12/25/2017 09/24/17   Lupita Leash, MD  escitalopram (LEXAPRO) 20 MG tablet Take 1 tablet (20 mg total) by mouth daily. 12/25/17   Arfeen, Phillips Grout, MD  furosemide (LASIX) 40 MG tablet Take daily as needed for leg swelling. Patient not taking: Reported on 12/25/2017 11/07/17   Lupita Leash, MD  furosemide (LASIX) 40 MG tablet TAKE 1 TABLET BY MOUTH EVERY DAY 01/02/18   Lupita Leash, MD  guaiFENesin (MUCINEX) 600 MG 12 hr tablet Take 1 tablet (600 mg total) by mouth 2 (two) times daily. 09/24/17   Lupita Leash, MD  Nintedanib (OFEV) 150 MG CAPS Take 150 mg by mouth 2 (two) times daily. 12/19/16   Lupita Leash, MD  oxymetazoline (AFRIN) 0.05 % nasal spray Place 1 spray into both nostrils as needed for congestion.    [provider]  pramipexole (MIRAPEX) 0.5 MG tablet TAKE 1 TABLET BY MOUTH EVERY MORNING AND TAKE 1 TABLET  EVERY EVENING 08/15/17   Nilda Riggs, NP  predniSONE (DELTASONE) 10 MG tablet  x7days,20mg  x 7 days. Then stop. Patient not taking: Reported on 12/25/2017 12/11/17   Lupita Leash, MD  predniSONE (DELTASONE) 5 MG tablet Take 4 tabs for 7 days, take 2 tabs for 7 days, then 1 tab for 7 days, then stop. 12/25/17   Bevelyn Ngo, NP  PROAIR HFA 108 (90 BASE) MCG/ACT inhaler Inhale 2 puffs into the lungs every 6 (six) hours as needed. 01/28/14   [provider]  QUEtiapine (SEROQUEL) 100 MG tablet  02/27/17   [provider]  sodium chloride HYPERTONIC 3 % nebulizer solution Take by nebulization 2 (two) times daily as needed for other. 06/27/17   Lupita Leash, MD  triamcinolone cream (KENALOG) 0.1 % APPLY 1 APPLICATION ON THE SKIN TWICE A DAY AS NEEDED 11/07/16   [provider]    Family History Family History  Problem Relation Age of  Onset  . Emphysema Mother   . Heart disease Mother   . Alzheimer's disease Mother   . Heart disease Father   . Stroke Paternal Grandfather   . Allergies Unknown        FH: FATHER,2 SISTER,1 BROTHER, SON  DAUGHTER  . Cancer Sister     Social History Social History   Tobacco Use  . Smoking status: Former Smoker    Packs/day: 3.00    Years: 17.00    Pack years: 51.00    Types: Cigarettes    Last attempt to quit: 08/26/1977    Years since quitting: 40.3  . Smokeless tobacco: Never Used  Substance Use Topics  . Alcohol use: Yes    Comment: rarely  . Drug use: No     Allergies   Gabapentin; Sulfonamide derivatives; and Trazodone and nefazodone   Review of Systems Review of Systems  Unable to perform ROS: Acuity of condition  Constitutional: Positive for chills and fever.  Respiratory: Positive for cough, shortness of breath and wheezing.   Cardiovascular: Positive for chest pain.   Physical Exam Updated Vital Signs BP 128/73   Pulse (!) 106   Temp (!) 101.5 F (38.6 C)   Resp (!) 26   Ht 5' 7.5"  (1.715 m)   Wt 93.4 kg (206 lb)   SpO2 99%   BMI 31.79 kg/m   Physical Exam  Constitutional:  Ill-appearing, in respiratory distress  HENT:  Head: Normocephalic and atraumatic.  Eyes: Pupils are equal, round, and reactive to light. EOM are normal.  Neck: Neck supple. No JVD present.  Cardiovascular: Regular rhythm.  No murmur heard. Tachycardic  Pulmonary/Chest: Accessory muscle usage present. Tachypnea noted. He is in respiratory distress. He exhibits no swelling.  Bibasilar crackles present, diffuse expiratory wheezing throughout all lung fields  Musculoskeletal: Normal range of motion.  Neurological: He is alert.  Mildly confused  Skin: Skin is warm and dry. There is cyanosis.  Vitals reviewed.    ED Treatments / Results  Labs (all labs ordered are listed, but only abnormal results are displayed) Labs Reviewed  CULTURE, BLOOD (ROUTINE X 2)  CULTURE, BLOOD (ROUTINE X 2)  COMPREHENSIVE METABOLIC PANEL  CBC WITH DIFFERENTIAL/PLATELET  PROTIME-INR  URINALYSIS, ROUTINE W REFLEX MICROSCOPIC  BRAIN NATRIURETIC PEPTIDE  BLOOD GAS, ARTERIAL  I-STAT CG4 LACTIC ACID, ED    EKG EKG Interpretation  Date/Time:  Saturday Jan 03 2018 16:20:37 EDT Ventricular Rate:  106 PR Interval:    QRS Duration: 97 QT Interval:  346 QTC Calculation: 460 R Axis:   -62 Text Interpretation:  Sinus tachycardia Ventricular premature complex Left anterior fascicular block Abnormal R-wave progression, late transition No significant change since last tracing Confirmed by Richardean Canal [phone]) on 01/03/2018 4:30:44 PM   Radiology No results found.  Procedures Procedures (including critical care time)  Medications Ordered in ED Medications  methylPREDNISolone sodium succinate (SOLU-MEDROL) 125 mg/2 mL injection 125 mg (has no administration in time range)  acetaminophen (TYLENOL) suppository 650 mg (has no administration in time range)  magnesium sulfate IVPB 2 g 50 mL (has no administration  in time range)  vancomycin (VANCOCIN) IVPB 1000 mg/200 mL premix (has no administration in time range)  ceFEPIme (MAXIPIME) 2 g in sodium chloride 0.9 % 100 mL IVPB (has no administration in time range)     Initial Impression / Assessment and Plan / ED Course  I have reviewed the triage vital signs and the nursing notes.  Pertinent labs & imaging  results that were available during my care of the patient were reviewed by me and considered in my medical decision making (see chart for details).  77 year old male with a hx of IPF on 8L O2 at home and recently referred to hospice presenting to the ED with shortness of breath, fevers, cough starting two days ago. Septic on arrival with temperature of 101.82F, HR 106. Desaturations into the mid 70s on 8L by NRB. Placed on bipap and started on empiric Vanc and Cefepime. Will hold off on fluid resuscitation as patient is on Lasix at home and BPs are normal. Will order CBC, CMP, blood cultures, lactic acid, BNP, ABG, CXR, EKG.  5:30PM: Re-assess patient. Work of breathing much improved on Bipap. Patient appears more comfortable. CXR with pulmonary edema vs superimposed infection. ABG with pH 7.365. PCO2 50.0, pO2 226, bicarb 28.  6:15PM: Discussed with CCM, Dr. Sherren Kerns, who will admit patient to ICU.   Final Clinical Impressions(s) / ED Diagnoses   Final diagnoses:  None    ED Discharge Orders    None       Amely Voorheis, Allyn Kenner, MD 01/03/18 1818    Charlynne Pander, MD 01/03/18 2314

## 2018-01-03 NOTE — Progress Notes (Signed)
Pharmacy Antibiotic Note  Jeffrey Cobb is a 77 y.o. male admitted on 01/03/2018 with pneumonia.  Pharmacy has been consulted for Vancomycin/Cefepime dosing. Pt has hx of pulmonary fibrosis, here with shortness of breath, CXR with possible PNA. WBC WNL. Renal function ok.   Plan: Vancomycin 1000 mg IV q12h Cefepime 1g IV q8h Trend WBC, temp, renal function  F/U infectious work-up Drug levels as indicated   Height:  (170.2 cm) Weight: 203 lb 11.3 oz (92.4 kg) IBW/kg (Calculated) : 66.1  Temp (24hrs), Avg:100.6 F (38.1 C), Min:99.7 F (37.6 C), Max:101.5 F (38.6 C)  Recent Labs  Lab 01/03/18 1633 01/03/18 1635 01/03/18 1824  WBC 10.5  --   --   CREATININE 1.10  --   --   LATICACIDVEN  --  3.28* 0.71    Estimated Creatinine Clearance: 60.9 mL/min (by C-G formula based on SCr of 1.1 mg/dL).    Allergies  Allergen Reactions  . Gabapentin     Dizziness   . Sulfonamide Derivatives Rash  . Trazodone And Nefazodone Rash and Other (See Comments)    Hallucinations     Abran Duke 01/03/2018 10:53 PM

## 2018-01-03 NOTE — ED Notes (Signed)
Pt taken directly to ED Rm 3. RRT and EDP at bedside.

## 2018-01-03 NOTE — H&P (Signed)
.. ..  Name: Jeffrey Cobb MRN: [id number] DOB: 31-Dec-1940    ADMISSION DATE:  01/03/2018 CONSULTATION DATE:  01/03/2018  REFERRING MD :  [facility]- MAYO MD  CHIEF COMPLAINT:  Shortness of Breath  BRIEF PATIENT DESCRIPTION: 77 yr old male with PMHx sig IPF ( Home O2 8L) presented with SOB to [facility]. Found to be in Acute on Chronic Hypoxic Respiratory Failure and was started on NIPPV. Transferred to [facility].  SIGNIFICANT EVENTS  SOB- NIPPV improved  STUDIES:  CXR: pulm opacity increasing noted in left mid lung CT CHEST W/O CON: confirms GGO no focal consolidation and 25m nodule seen in upper lobe  ABG: 7.36/50/226/30 ( post intervention, no pre intervention gas) LA 3.28--. 0.71   HISTORY OF PRESENT ILLNESS: 77year old male with a past medical history of idiopathic pulmonary fibrosis, hypertension, GERD, dyslipidemia, TIA, pulmonary nodule, RLS, OSA on CPAP, [facility].  His usual baseline is oxygen requirements of 8 L nasal cannula.  And he was recently evaluated for bronchitis and started on doxycycline and prednisone as an outpatient.  He states over the last several days he has been having worsening shortness of breath and today he turned blue and was unable to walk. [facility], [facility], sats 70% on nonrebreather using accessory muscles and only able to do one-word sentences.  He was placed on noninvasive positive pressure ventilation, lactate 3.28, patient received nebs, steroids, magnesium, antibiotics.  PCCM was called for admission of acute hypoxic respiratory failure secondary to idiopathic pulmonary fibrosis exacerbation.  On my evaluation of the patient he is alert, speaking in full sentences, continues on noninvasive positive pressure ventilation in no visible distress saturations 93 to 95%. His wife is at bedside. He [facility]  [facility]  He follows with Dr. MLake Bellsas an outpatient.  Both him and his wife stated that the patient has been in the process of clarifying his goals of care and end-of-life wishes.  He has met with palliative care/hospice.  I asked the patient in the event that our current interventions were not successful what would he want, he stated that he does not want to be intubated and placed on mechanical ventilation,  in the event that his heart should stop he does not want CPR/ACLS, he is okay with noninvasive positive pressure ventilation or heated high flow nasal cannula.  PAST MEDICAL HISTORY :   has a past medical history of Acute sinusitis, unspecified, Arthritis, Complication of anesthesia, GERD (gastroesophageal reflux disease), Left anterior fascicular block, Mixed hyperlipidemia, Obstructive chronic bronchitis with exacerbation ([place], Occlusion and stenosis of carotid artery without mention of cerebral infarction, Other emphysema [place], Persistent disorder of initiating or maintaining sleep, Pulmonary fibrosis ([place], Sleep apnea, and Stroke ([place] (05[place]2015).  has a past surgical history that includes Spine surgery (2000/2011); RUPTURED DISK (2000); LEFT KNEE REPLACEMENT (2001); RIGHT KNEE REPLACEMENT (2005); Joint replacement (Right); Eye surgery (Bilateral); torn retina (Left); Total shoulder arthroplasty (Left, 06/30/2013); RIGHT ANKLE RECONSTRUCTION; Appendectomy (1956); Esophagogastroduodenoscopy (egd) with propofol (N/A, 05/02/2016); and BRAVO ph study (N/A, 05/02/2016). Prior to Admission medications   Medication Sig Start Date End Date Taking? Authorizing Provider  albuterol (PROVENTIL) (2.5 MG/3ML) 0.083% nebulizer solution Take 3 mLs (2.5 mg total) by nebulization every 6 (six) hours as needed for wheezing or shortness of breath. 06/20/17   MSimonne Maffucci  B, MD  aspirin 81 MG tablet Take 81 mg by mouth daily.    [provider]  atorvastatin  (LIPITOR) 40 MG tablet Take 1 tablet (40 mg total) by mouth daily at 6 PM. 06/01/15   Ghimire, Henreitta Leber, MD  chlorpheniramine-HYDROcodone (TUSSIONEX PENNKINETIC ER) 10-8 MG/5ML SUER Take 5 mLs by mouth every 12 (twelve) hours as needed for cough. Patient not taking: Reported on 12/25/2017 11/07/17   Juanito Doom, MD  chlorpheniramine-HYDROcodone [facility] PENNKINETIC ER) 10-8 MG/5ML SUER Take 5 mLs by mouth every 12 (twelve) hours as needed for cough. 12/25/17   Magdalen Spatz, NP  cyanocobalamin 500 MCG tablet Take 1,000 mcg by mouth daily.    [provider]  doxycycline (VIBRA-TABS) 100 MG tablet Take 1 tablet (100 mg total) by mouth 2 (two) times daily. Patient not taking: Reported on 12/25/2017 09/24/17   Juanito Doom, MD  escitalopram (LEXAPRO) 20 MG tablet Take 1 tablet (20 mg total) by mouth daily. 12/25/17   Arfeen, Arlyce Harman, MD  furosemide (LASIX) 40 MG tablet Take daily as needed for leg swelling. Patient not taking: Reported on 12/25/2017 11/07/17   Juanito Doom, MD  furosemide (LASIX) 40 MG tablet TAKE 1 TABLET BY MOUTH EVERY DAY 01/02/18   Juanito Doom, MD  guaiFENesin (MUCINEX) 600 MG 12 hr tablet Take 1 tablet (600 mg total) by mouth 2 (two) times daily. 09/24/17   Juanito Doom, MD  Nintedanib (OFEV) 150 MG CAPS Take 150 mg by mouth 2 (two) times daily. 12/19/16   Juanito Doom, MD  oxymetazoline (AFRIN) 0.05 % nasal spray Place 1 spray into both nostrils as needed for congestion.    [provider]  pramipexole (MIRAPEX) 0.5 MG tablet TAKE 1 TABLET BY MOUTH EVERY MORNING AND TAKE 1 TABLET EVERY EVENING 08/15/17   Dennie Bible, NP  predniSONE (DELTASONE) 10 MG tablet 33m x7days,220mx 7 days. Then stop. Patient not taking: Reported on 12/25/2017 12/11/17   McJuanito DoomMD  predniSONE (DELTASONE) 5 MG tablet Take 4 tabs for 7 days, take 2 tabs for 7 days, then 1 tab for 7 days, then stop. 12/25/17   GrMagdalen SpatzNP  PROAIR HFA 108  (90 BASE) MCG/ACT inhaler Inhale 2 puffs into the lungs every 6 (six) hours as needed. 01/28/14   [provider]  QUEtiapine (SEROQUEL) 100 MG tablet  02/27/17   [provider]  sodium chloride HYPERTONIC 3 % nebulizer solution Take by nebulization 2 (two) times daily as needed for other. 06/27/17   McJuanito DoomMD  triamcinolone cream (KENALOG) 0.1 % APPLY 1 APPLICATION ON THE SKIN TWICE A DAY AS NEEDED 11/07/16   [provider]   Allergies  Allergen Reactions  . Gabapentin     Dizziness   . Sulfonamide Derivatives Rash  . Trazodone And Nefazodone Rash and Other (See Comments)    Hallucinations     FAMILY HISTORY:  family history includes Allergies in his unknown relative; Alzheimer's disease in his mother; Cancer in his sister; Emphysema in his mother; Heart disease in his father and mother; Stroke in his paternal grandfather. SOCIAL HISTORY:  reports that he quit smoking about 40 years ago. His smoking use included cigarettes. He has a 51.00 pack-year smoking history. He has never used smokeless tobacco. He reports that he drinks alcohol. He reports that he does not use drugs.  REVIEW OF SYSTEMS:   Constitutional: Negative for fever, chills, weight loss,  malaise/fatigue and diaphoresis.  HENT: Negative for hearing loss, ear pain, nosebleeds, congestion, sore throat, neck pain, tinnitus and ear discharge.   Eyes: Negative for blurred vision, double vision, photophobia, pain, discharge and redness.  Respiratory: + cough, hemoptysis, sputum production, +shortness of breath, wheezing and stridor.   Cardiovascular: Negative for chest pain, palpitations, orthopnea, claudication, leg swelling and PND.  Gastrointestinal: Negative for heartburn, nausea, vomiting, abdominal pain, diarrhea, constipation, blood in stool and melena.  Genitourinary: Negative for dysuria, urgency, frequency, hematuria and flank pain.  Musculoskeletal: Negative for myalgias, back pain,  joint pain and falls. + lower extremity edema Skin: Negative for itching and rash.  Neurological: Negative for dizziness, tingling, tremors, sensory change, speech change, focal weakness, seizures, loss of consciousness, weakness and headaches.  Endo/Heme/Allergies: Negative for environmental allergies and polydipsia. Does not bruise/bleed easily.  SUBJECTIVE:   VITAL SIGNS: Temp:  [99.7 F (37.6 C)-101.5 F (38.6 C)] 99.7 F (37.6 C) (05/11 1822) Pulse Rate:  [79-114] 88 (05/11 2127) Resp:  [17-30] 24 (05/11 2127) BP: (95-158)/(69-91) 158/84 (05/11 2120) SpO2:  [76 %-100 %] 94 % (05/11 2127) FiO2 (%):  [60 %-100 %] 60 % (05/11 2015) Weight:  [92.4 kg (203 lb 11.3 oz)-93.4 kg (206 lb)] 92.4 kg (203 lb 11.3 oz) (05/11 2120)  PHYSICAL EXAMINATION: General:  Alert pleasant gentleman in NAD Neuro:  AAOx4 no focal deficits HEENT:  NCAT, NIPPV mask on with good seal Cardiovascular:  S1 and S2 appreciated Lungs:  Coarse breath sounds bilaterallyL>R, with decreased BS at bases Abdomen:  Soft obese not distended + BS Musculoskeletal:  B/l lower extremity edema + 2 to the knees Skin:  Grossly intact  Recent Labs  Lab 01/03/18 1633  NA 131*  K 3.5  CL 94*  CO2 23  BUN 15  CREATININE 1.10  GLUCOSE 148*   Recent Labs  Lab 01/03/18 1633  HGB 13.7  HCT 38.9*  WBC 10.5  PLT 138*   Ct Chest Wo Contrast  Result Date: 01/03/2018 CLINICAL DATA:  Acute respiratory illness. History of chronic bronchitis/COPD, pulmonary fibrosis. EXAM: CT CHEST WITHOUT CONTRAST TECHNIQUE: Multidetector CT imaging of the chest was performed following the standard protocol without IV contrast. COMPARISON:  Chest radiograph Jan 03, 2018 and CT chest October 11, 2016 FINDINGS: CARDIOVASCULAR: Dense heart size is normal. Moderate coronary artery calcifications. No pericardial effusion. Thoracic aorta is normal course and caliber, mild calcific atherosclerosis. Chronically enlarged main pulmonary artery  associated with pulmonary arterial hypertension. MEDIASTINUM/NODES: Mild mediastinal lymphadenopathy is similar to prior CT attributable to parenchymal process. LUNGS/PLEURA: Tracheobronchial tree is patent, no pneumothorax. Increasing ground-glass opacities throughout all lobes of the lungs. Worsening chronic interstitial lung disease most conspicuous in the lung bases. Mild bronchiectasis. Enlarging 7 mm pulmonary nodule RIGHT upper lobe (series 4, image 65) and 7 mm LEFT upper lobe pulmonary nodule (series 4, image 42). No pleural effusion or focal consolidation. UPPER ABDOMEN: Nonacute. Colonic intra positioning. 5.2 cm simple hepatic cyst segment 5/6. Colonic diverticulosis. MUSCULOSKELETAL: Nonacute. Bilateral shoulder arthroplasties resultant streak artifact limiting evaluation of the thoracic inlet. Severe degenerative change of thoracic spine. IMPRESSION: 1. Increasing ground-glass opacities seen with infectious or inflammatory etiologies without focal consolidation. 2. Worsening chronic interstitial lung disease. 3. Enlarging bilateral upper lobe pulmonary nodules measuring 7 mm. Aortic Atherosclerosis (ICD10-I70.0). Electronically Signed   By: Elon Alas M.D.   On: 01/03/2018 22:54   Dg Chest Portable 1 View  Result Date: 01/03/2018 CLINICAL DATA:  Per nursing notes: Patient states that he has  had SOB since yesterday - patient is on 8 Liters [place] and 64% - patient has increased WOB and SOB - patient has blue nail beds. Hx pulmonary fibrosis, emphysema EXAM: PORTABLE CHEST 1 VIEW COMPARISON:  12/25/2017 FINDINGS: Lung volumes are shallow. Heart size is normal. There are reticular changes throughout the lungs bilaterally, consistent with known pulmonary fibrosis. Opacities are increased in the UPPER lobes and LEFT mid lung zone compared to prior study, raising the question of asymmetric pulmonary edema and/or infectious infiltrate. Status post bilateral shoulder arthroplasty. IMPRESSION: Increased  pulmonary opacities, raising question of pulmonary edema and/or infection superimposed on known pulmonary fibrosis. Electronically Signed   By: Nolon Nations M.D.   On: 01/03/2018 16:57    ASSESSMENT / PLAN: NEURO: No active issues at this time GCS 15 Not requiring an sedation/analgesia  CARDIAC: HFpEF Chronic diastolic dysfunction Cardiogenic Pulmonary edema BNP elevated, b/l lower ext pitting edema CXR concerning for pulm edema vs infectious process Cont with diuresis Last EF 55%, G1DD,  PAP 3mHg Continue home BP meds for HTN mgmt  PULMONARY: Acute on Chronic Hypoxic Respiratory Failure secondary to AEIPF Pt has a h/o IPF- started on Solumedrol IV in acute setting ( also h/o prednisone as an outpt) will taper. CT chest w/o contrast- demonstrates GGO concerning for edema vs infection and worsening parenchymal disease Started on diuresis Continue Abx Vancomycin and Cefepime In addition Azithromycin added Send RVP and PCT Trend WBC, fever curve LA trended to <1. On evaluation pt on NIPPV Sat 93-95% Will attempt to transition pt to heated HFNC with goal O2 92% H/o Sleep Apnea- when sleeping recommend CPAP  ID: Pneumonia GGO on imaging, febrile illness Per wife and pt h/o cough and fever in wife several weeks ago then pt had similar symptoms F/u blood cultures, sputum, RVP Trend WBC, fever curve Continue Vancomycin and Cefepime for broad spectrum  Added Azithromycin for atypical coverage F/u legionella, Strep, and Mycoplasma Droplet isolation   Endocrine: No H/o insulin dependence If BG exceeds 1818mdl will start Phase 1 Insulin protocol   GI: H/o GERD Currently NPO As pt improves HOB >30 deg Aspiration precautions Bedside swallow prior to any PO - h/o laryngeal dysfn Will start on Diet when resp status more stable GI Prophylaxis with Protonix or pepcid  Heme: No acute Issues No signs of active bleeding If Hgb<7 transfuse PRBCs ..OJenetta DownerOS DVT PPx-> Hep  [place] and SCDs   RENAL No evidence of Acute Injury Lab Results  Component Value Date   CREATININE 1.10 01/03/2018   CREATININE 0.91 12/25/2017   CREATININE 0.98 12/11/2017  Continues on diuresis for possible Cardiogenic pulm edema in the setting of diastolic dysfunction. Potassium replacement as needed LA cleared     I, Dr KrSeward Carolave personally reviewed patient's available data, including medical history, events of note, physical examination and test results as part of my evaluation. I have discussed with NP EuDewaine Oatsnd other care providers such as pharmacist, RN and RRT. The patient is critically ill with multiple organ systems failure and requires high complexity decision making for assessment and support, frequent evaluation and titration of therapies, application of advanced monitoring technologies and extensive interpretation of multiple databases. Critical Care Time devoted to patient care services described in this note is [id number]. This time reflects time of care of this signee Dr KrSeward CarolThis critical care time does not reflect procedure time, or teaching time or supervisory time of NP [place] but could involve care discussion time  DISPOSITION: ICU CC TIME: 55 mins PROGNOSIS: Guarded FAMILY: wife at bedside. Recently spoke with Hospice/Pall care about goals and they expressed the need to complete their directives. CODE STATUS: DNR DNI but is ok with NIPPV   Signed Dr Seward Carol Pulmonary Critical Care Locums   01/03/2018, 10:59 PM

## 2018-01-03 NOTE — Progress Notes (Signed)
Placed patient on heated high flow cannula at 60% with liter flow set at 30LPM. Dr.Scatliffe aware.

## 2018-01-03 NOTE — ED Notes (Signed)
Right Radial Artery x 1 attempt for ABG. No complications noted. Bleeding stopped.  

## 2018-01-04 ENCOUNTER — Encounter (HOSPITAL_COMMUNITY): Payer: Self-pay

## 2018-01-04 LAB — GLUCOSE, CAPILLARY
GLUCOSE-CAPILLARY: 135 mg/dL — AB (ref 65–99)
GLUCOSE-CAPILLARY: 149 mg/dL — AB (ref 65–99)
Glucose-Capillary: 117 mg/dL — ABNORMAL HIGH (ref 65–99)
Glucose-Capillary: 139 mg/dL — ABNORMAL HIGH (ref 65–99)
Glucose-Capillary: 142 mg/dL — ABNORMAL HIGH (ref 65–99)
Glucose-Capillary: 219 mg/dL — ABNORMAL HIGH (ref 65–99)

## 2018-01-04 LAB — BASIC METABOLIC PANEL
Anion gap: 12 (ref 5–15)
BUN: 12 mg/dL (ref 6–20)
CO2: 26 mmol/L (ref 22–32)
Calcium: 8.4 mg/dL — ABNORMAL LOW (ref 8.9–10.3)
Chloride: 97 mmol/L — ABNORMAL LOW (ref 101–111)
Creatinine, Ser: 0.97 mg/dL (ref 0.61–1.24)
GFR calc Af Amer: >60 mL/min (ref >60)
GFR calc non Af Amer: >60 mL/min (ref >60)
GLUCOSE: 145 mg/dL — AB (ref 65–99)
POTASSIUM: 3.5 mmol/L (ref 3.5–5.1)
Sodium: 135 mmol/L (ref 135–145)

## 2018-01-04 LAB — RESPIRATORY PANEL BY PCR
Adenovirus: NOT DETECTED
Bordetella pertussis: NOT DETECTED
CHLAMYDOPHILA PNEUMONIAE-RVPPCR: NOT DETECTED
CORONAVIRUS 229E-RVPPCR: NOT DETECTED
Coronavirus HKU1: NOT DETECTED
Coronavirus NL63: NOT DETECTED
Coronavirus OC43: NOT DETECTED
INFLUENZA A-RVPPCR: NOT DETECTED
INFLUENZA B-RVPPCR: NOT DETECTED
MYCOPLASMA PNEUMONIAE-RVPPCR: NOT DETECTED
Metapneumovirus: NOT DETECTED
PARAINFLUENZA VIRUS 1-RVPPCR: NOT DETECTED
PARAINFLUENZA VIRUS 4-RVPPCR: NOT DETECTED
Parainfluenza Virus 2: NOT DETECTED
Parainfluenza Virus 3: NOT DETECTED
RESPIRATORY SYNCYTIAL VIRUS-RVPPCR: NOT DETECTED
Rhinovirus / Enterovirus: NOT DETECTED

## 2018-01-04 LAB — MAGNESIUM: MAGNESIUM: 2.3 mg/dL (ref 1.7–2.4)

## 2018-01-04 LAB — CBC
HCT: 38.2 % — ABNORMAL LOW (ref 39.0–52.0)
HEMOGLOBIN: 13.2 g/dL (ref 13.0–17.0)
MCH: 31.4 pg (ref 26.0–34.0)
MCHC: 34.6 g/dL (ref 30.0–36.0)
MCV: 90.7 fL (ref 78.0–100.0)
PLATELETS: 132 10*3/uL — AB (ref 150–400)
RBC: 4.21 MIL/uL — AB (ref 4.22–5.81)
RDW: 15.4 % (ref 11.5–15.5)
WBC: 8.7 10*3/uL (ref 4.0–10.5)

## 2018-01-04 LAB — PHOSPHORUS: Phosphorus: 3.7 mg/dL (ref 2.5–4.6)

## 2018-01-04 LAB — PROCALCITONIN: PROCALCITONIN: 1.06 ng/mL

## 2018-01-04 LAB — STREP PNEUMONIAE URINARY ANTIGEN: Strep Pneumo Urinary Antigen: NEGATIVE

## 2018-01-04 LAB — TROPONIN I
TROPONIN I: 0.08 ng/mL — AB (ref <0.03)
Troponin I: 0.06 ng/mL (ref <0.03)

## 2018-01-04 MED ORDER — HYDROCOD POLST-CPM POLST ER 10-8 MG/5ML PO SUER
5.0000 mL | Freq: Two times a day (BID) | ORAL | Status: DC | PRN
  Administered 2018-01-04 – 2018-01-09 (×8): 5 mL via ORAL
  Filled 2018-01-04 (×8): qty 5

## 2018-01-04 MED ORDER — QUETIAPINE FUMARATE 25 MG PO TABS
100.0000 mg | ORAL_TABLET | Freq: Every day | ORAL | Status: DC
  Administered 2018-01-04 – 2018-01-10 (×7): 100 mg via ORAL
  Filled 2018-01-04 (×2): qty 4
  Filled 2018-01-04: qty 1
  Filled 2018-01-04 (×2): qty 4
  Filled 2018-01-04: qty 1
  Filled 2018-01-04 (×2): qty 4

## 2018-01-04 MED ORDER — METHYLPREDNISOLONE SODIUM SUCC 125 MG IJ SOLR
80.0000 mg | Freq: Four times a day (QID) | INTRAMUSCULAR | Status: DC
  Administered 2018-01-04 – 2018-01-09 (×20): 80 mg via INTRAVENOUS
  Filled 2018-01-04 (×2): qty 2
  Filled 2018-01-04: qty 1.28
  Filled 2018-01-04 (×5): qty 2
  Filled 2018-01-04 (×2): qty 1.28
  Filled 2018-01-04: qty 2
  Filled 2018-01-04 (×2): qty 1.28
  Filled 2018-01-04 (×2): qty 2
  Filled 2018-01-04: qty 1.28
  Filled 2018-01-04 (×2): qty 2
  Filled 2018-01-04: qty 1.28
  Filled 2018-01-04 (×4): qty 2

## 2018-01-04 MED ORDER — FAMOTIDINE IN NACL 20-0.9 MG/50ML-% IV SOLN
20.0000 mg | Freq: Two times a day (BID) | INTRAVENOUS | Status: DC
  Administered 2018-01-05 – 2018-01-08 (×7): 20 mg via INTRAVENOUS
  Filled 2018-01-04 (×9): qty 50

## 2018-01-04 MED ORDER — PRAMIPEXOLE DIHYDROCHLORIDE 0.25 MG PO TABS
0.5000 mg | ORAL_TABLET | Freq: Two times a day (BID) | ORAL | Status: DC
  Administered 2018-01-04 – 2018-01-11 (×15): 0.5 mg via ORAL
  Filled 2018-01-04 (×16): qty 2

## 2018-01-04 NOTE — Assessment & Plan Note (Signed)
Restart mirapex

## 2018-01-04 NOTE — Assessment & Plan Note (Signed)
End stage  Plan Dc ofev at this point (wife also thinks it did not work) Hospice appropriate

## 2018-01-04 NOTE — Progress Notes (Signed)
Patient placed back on bipap d/t sats of 78% on HFNC. Patient now resting in bed with sats of 94%. Will continue to monitor.

## 2018-01-04 NOTE — Progress Notes (Signed)
.. ..  Name: Jeffrey Cobb MRN: [id number] DOB: 02-24-1941    ADMISSION DATE:  01/03/2018 CONSULTATION DATE:  01/03/2018  REFERRING MD :  [facility]- MAYO MD    BRIEF  77 year old male with a past medical history of idiopathic pulmonary fibrosis, hypertension, GERD, dyslipidemia, TIA, pulmonary nodule, RLS, OSA on CPAP, resents to [facility] Cobb, ICU from [name] after being evaluated for severe dyspnea.  His usual baseline is oxygen requirements of 8 L nasal cannula.  And he was recently evaluated for bronchitis and started on doxycycline and prednisone as an outpatient.  He states over the last several days he has been having worsening shortness of breath and today he turned blue and was unable to walk. On arrival to [name] he was febrile, cyanotic, sats 70% on nonrebreather using accessory muscles and only able to do one-word sentences.  He was placed on noninvasive positive pressure ventilation, lactate 3.28, patient received nebs, steroids, magnesium, antibiotics.  PCCM was called for admission of acute hypoxic respiratory failure secondary to idiopathic pulmonary fibrosis exacerbation.  On my evaluation of the patient he is alert, speaking in full sentences, continues on noninvasive positive pressure ventilation in no visible distress saturations 93 to 95%. His wife is at bedside. He reiterates the story that he gave to [facility] on presentation.  He follows with Dr. [name] as an outpatient.  Both him and his wife stated that the patient has been in the process of clarifying his goals of care and end-of-life wishes.  He has met with palliative care/hospice.  I asked the patient in the event that our current interventions were not successful what would he want, he stated that he does not want to be intubated and placed on mechanical ventilation,  in the event that his heart should stop he does not want CPR/ACLS, he is okay with noninvasive  positive pressure ventilation or heated high flow nasal cannula.   SIGNIFICANT EVENTS  SOB- NIPPV improved  STUDIES:  CXR: pulm opacity increasing noted in left mid lung CT CHEST W/O CON: confirms GGO no focal consolidation and 56m nodule seen in upper lobe  ABG: 7.36/50/226/30 ( post intervention, no pre intervention gas) LA 3.28--. 0.71  SUBJECTIVE:   5/12 - having mild hemoptysis with couhg. WAnts his mirapex. Was febrile but now better. Still needing BiPAP and HFNC . Wife says 01/01/18 he deteriorated. RVP negative. PCT in the 1s. On multiple antibiotics. CT chest with GGO and suggests IPF Flare   VITAL SIGNS: Temp:  [97.4 F (36.3 C)-101.5 F (38.6 C)] 97.8 F (36.6 C) (05/12 0812) Pulse Rate:  [74-114] 104 (05/12 0600) Resp:  [17-31] 25 (05/12 0600) BP: (95-166)/(69-100) 166/77 (05/12 0600) SpO2:  [76 %-100 %] 93 % (05/12 0600) FiO2 (%):  [60 %-100 %] 70 % (05/12 0127) Weight:  [92.4 kg (203 lb 11.3 oz)-93.4 kg (206 lb)] 92.4 kg (203 lb 11.3 oz) (05/12 0442)  PHYSICAL EXAMINATION:  General Appearance:    Looks criticall ill   Head:    Normocephalic, without obvious abnormality, atraumatic  Eyes:    PERRL - yes, conjunctiva/corneas - clear      Ears:    Normal external ear canals, both ears  Nose:   NG tube - NO   Throat:  ETT TUBE - no but has BIPAP , OG tube - no  Neck:   Supple,  No enlargement/tenderness/nodules     Lungs:     Crackles  Chest wall:    No deformity  Heart:    S1 and S2 normal, no murmur, CVP - no.  Pressors - no  Abdomen:     Soft, no masses, no organomegaly  Genitalia:    Not done  Rectal:   not done  Extremities:   Extremities- intact. No edema     Skin:   Intact in exposed areas . Sacral area - intact     Neurologic:   Sedation - none -> RASS - +1 . Moves all 4s - uyes. CAM-ICU - neg . Orientation - x3+      PULMONARY Recent Labs  Lab 01/03/18 1725  PHART 7.365  PCO2ART 50.0*  PO2ART 226.0*  HCO3 28.1*  TCO2 30  O2SAT 100.0     CBC Recent Labs  Lab 01/03/18 1633 01/04/18 0324  HGB 13.7 13.2  HCT 38.9* 38.2*  WBC 10.5 8.7  PLT 138* 132*    COAGULATION Recent Labs  Lab 01/03/18 1633  INR 1.10    CARDIAC   Recent Labs  Lab 01/03/18 2300 01/04/18 0324  TROPONINI 0.08* 0.08*   No results for input(s): PROBNP in the last 168 hours.   CHEMISTRY Recent Labs  Lab 01/03/18 1633 01/04/18 0324  NA 131* 135  K 3.5 3.5  CL 94* 97*  CO2 23 26  GLUCOSE 148* 145*  BUN 15 12  CREATININE 1.10 0.97  CALCIUM 8.3* 8.4*  MG  --  2.3  PHOS  --  3.7   Estimated Creatinine Clearance: 69.1 mL/min (by C-G formula based on SCr of 0.97 mg/dL).   LIVER Recent Labs  Lab 01/03/18 1633  AST 59*  ALT 40  ALKPHOS 81  BILITOT 2.3*  PROT 7.1  ALBUMIN 3.3*  INR 1.10     INFECTIOUS Recent Labs  Lab 01/03/18 1635 01/03/18 1824 01/03/18 2300 01/04/18 0324  LATICACIDVEN 3.28* 0.71  --   --   PROCALCITON  --   --  0.59 1.06     ENDOCRINE CBG (last 3)  Recent Labs    01/04/18 0011 01/04/18 0402 01/04/18 0814  GLUCAP 135* 117* 139*         IMAGING x48h  - image(s) personally visualized  -   highlighted in bold Ct Chest Wo Contrast  Result Date: 01/03/2018 CLINICAL DATA:  Acute respiratory illness. History of chronic bronchitis/COPD, pulmonary fibrosis. EXAM: CT CHEST WITHOUT CONTRAST TECHNIQUE: Multidetector CT imaging of the chest was performed following the standard protocol without IV contrast. COMPARISON:  Chest radiograph Jan 03, 2018 and CT chest October 11, 2016 FINDINGS: CARDIOVASCULAR: Dense heart size is normal. Moderate coronary artery calcifications. No pericardial effusion. Thoracic aorta is normal course and caliber, mild calcific atherosclerosis. Chronically enlarged main pulmonary artery associated with pulmonary arterial hypertension. MEDIASTINUM/NODES: Mild mediastinal lymphadenopathy is similar to prior CT attributable to parenchymal process. LUNGS/PLEURA:  Tracheobronchial tree is patent, no pneumothorax. Increasing ground-glass opacities throughout all lobes of the lungs. Worsening chronic interstitial lung disease most conspicuous in the lung bases. Mild bronchiectasis. Enlarging 7 mm pulmonary nodule RIGHT upper lobe (series 4, image 65) and 7 mm LEFT upper lobe pulmonary nodule (series 4, image 42). No pleural effusion or focal consolidation. UPPER ABDOMEN: Nonacute. Colonic intra positioning. 5.2 cm simple hepatic cyst segment 5/6. Colonic diverticulosis. MUSCULOSKELETAL: Nonacute. Bilateral shoulder arthroplasties resultant streak artifact limiting evaluation of the thoracic inlet. Severe degenerative change of thoracic spine. IMPRESSION: 1. Increasing ground-glass opacities seen with infectious or inflammatory etiologies without focal consolidation. 2. Worsening chronic  interstitial lung disease. 3. Enlarging bilateral upper lobe pulmonary nodules measuring 7 mm. Aortic Atherosclerosis (ICD10-I70.0). Electronically Signed   By: Elon Alas M.D.   On: 01/03/2018 22:54   Dg Chest Portable 1 View  Result Date: 01/03/2018 CLINICAL DATA:  Per nursing notes: Patient states that he has had SOB since yesterday - patient is on 8 Liters [place] and 64% - patient has increased WOB and SOB - patient has blue nail beds. Hx pulmonary fibrosis, emphysema EXAM: PORTABLE CHEST 1 VIEW COMPARISON:  12/25/2017 FINDINGS: Lung volumes are shallow. Heart size is normal. There are reticular changes throughout the lungs bilaterally, consistent with known pulmonary fibrosis. Opacities are increased in the UPPER lobes and LEFT mid lung zone compared to prior study, raising the question of asymmetric pulmonary edema and/or infectious infiltrate. Status post bilateral shoulder arthroplasty. IMPRESSION: Increased pulmonary opacities, raising question of pulmonary edema and/or infection superimposed on known pulmonary fibrosis. Electronically Signed   By: Nolon Nations M.D.   On:  01/03/2018 16:57       ASSESSMENT / PLAN: Principal Problem:   Acute on chronic respiratory failure with hypoxemia (HCC) Active Problems:   Idiopathic pulmonary fibrosis (HCC)   Goals of care, counseling/discussion   RLS (restless legs syndrome)   Respiratory failure with hypoxia and hypercapnia ([place])   Community acquired pneumonia    Acute on chronic respiratory failure with hypoxemia (HCC) IPF flare +/- CAP/HCAP. RVP negative  PLAN BiPAP HFNC High dose steroids Antibiotics as below Lasix Check urine strep and urine leg  Anti-infectives (From admission, onward)   Start     Dose/Rate Route Frequency Ordered Stop   01/03/18 2330  azithromycin (ZITHROMAX) 500 mg in sodium chloride 0.9 % 250 mL IVPB     500 mg 250 mL/hr over 60 Minutes Intravenous Daily at bedtime 01/03/18 2246     01/03/18 2300  ceFEPIme (MAXIPIME) 1 g in sodium chloride 0.9 % 100 mL IVPB     1 g 200 mL/hr over 30 Minutes Intravenous Every 8 hours 01/03/18 2251     01/03/18 2300  vancomycin (VANCOCIN) IVPB 1000 mg/200 mL premix     1,000 mg 200 mL/hr over 60 Minutes Intravenous Every 12 hours 01/03/18 2253     01/03/18 1656  ceFEPIme (MAXIPIME) 2 g injection    Note to Pharmacy:  Darcel Smalling   : cabinet override      01/03/18 1656 01/04/18 0459   01/03/18 1630  vancomycin (VANCOCIN) IVPB 1000 mg/200 mL premix     1,000 mg 200 mL/hr over 60 Minutes Intravenous  Once 01/03/18 1627 01/03/18 1819   01/03/18 1630  ceFEPIme (MAXIPIME) 2 g in sodium chloride 0.9 % 100 mL IVPB     2 g 200 mL/hr over 30 Minutes Intravenous  Once 01/03/18 1627 01/03/18 1729       Idiopathic pulmonary fibrosis ([place]) End stage  Plan Dc ofev at this point (wife also thinks it did not work) Hospice appropriate  Goals of care, counseling/discussion On 12/25/17 he had agreed to hospice ereferral in the outpatient  Plan Inpatient pallaitive care consult for symptoms and goals (currently 30d mortality is very  high) Tusssionex for cough  RLS (restless legs syndrome) Restart mirapex       DISPOSITION: move to sdu. CCM primary  PROGNOSIS: Guarded FAMILY: wife at bedside. Recently spoke with Hospice/Pall care about goals and they expressed the need to complete their directives. CODE STATUS: DNR DNI but is ok with NIPPV  Dr. Brand Males, M.D., [facility].C.P Pulmonary and Critical Care Medicine Staff Physician [facility] Pager: [phone], If no answer or between  15:00h - 7:00h: call [phone]  01/04/2018 9:54 AM

## 2018-01-04 NOTE — Progress Notes (Signed)
Chaplain was referred to Pt.'s room by attending Nurse. Patient engaged in some life review and shared that he is at peace with the reality of his condition and that he may not have many more days. Forgiveness was a Teacher, adult education of the pastoral conversation. Pt. Is grateful to have experienced restoration of significant relationships in the last couple of years. Patient is well supported by family and spouse. Chaplain offered deep empathic listening, affirmation, Scripture and Prayer. Pt. Expressed gratitude for the time spent.

## 2018-01-04 NOTE — Progress Notes (Signed)
CRITICAL VALUE ALERT  Critical Value: Troponin   Date & Time Notied: 01/04/2018 12:01  Provider Notified: Pola Corn MD  Orders Received/Actions taken: No orders were given

## 2018-01-04 NOTE — Progress Notes (Signed)
eLink Physician-Brief Progress Note Patient Name: Jeffrey Cobb DOB: Jan 23, 1941 MRN: [id number]   Date of Service  01/04/2018  HPI/Events of Note  Patient requests home Seroquel HS dose. His wife states that he takes 100 mg PO Q HS.   eICU Interventions  Will order: 1. Seroquel 100 mg PO Q HS.      Intervention Category Major Interventions: Other:  Lenell Antu 01/04/2018, 9:20 PM

## 2018-01-04 NOTE — Progress Notes (Signed)
Patient wanted to hold bipap for the night and just use high flow.

## 2018-01-04 NOTE — Progress Notes (Signed)
Patient stated his "bridge of his nose was sore, could he try and take a break again from the mask".  Placed patient on HFNC 35L at 80%. Sats maintaining 89-90%. Will continue to monitor.

## 2018-01-04 NOTE — Assessment & Plan Note (Signed)
IPF flare +/- CAP/HCAP. RVP negative  PLAN BiPAP HFNC High dose steroids Antibiotics as below Lasix Check urine strep and urine leg  Anti-infectives (From admission, onward)   Start     Dose/Rate Route Frequency Ordered Stop   01/03/18 2330  azithromycin (ZITHROMAX) 500 mg in sodium chloride 0.9 % 250 mL IVPB     500 mg 250 mL/hr over 60 Minutes Intravenous Daily at bedtime 01/03/18 2246     01/03/18 2300  ceFEPIme (MAXIPIME) 1 g in sodium chloride 0.9 % 100 mL IVPB     1 g 200 mL/hr over 30 Minutes Intravenous Every 8 hours 01/03/18 2251     01/03/18 2300  vancomycin (VANCOCIN) IVPB 1000 mg/200 mL premix     1,000 mg 200 mL/hr over 60 Minutes Intravenous Every 12 hours 01/03/18 2253     01/03/18 1656  ceFEPIme (MAXIPIME) 2 g injection    Note to Pharmacy:  Barnett Abu   : cabinet override      01/03/18 1656 01/04/18 0459   01/03/18 1630  vancomycin (VANCOCIN) IVPB 1000 mg/200 mL premix     1,000 mg 200 mL/hr over 60 Minutes Intravenous  Once 01/03/18 1627 01/03/18 1819   01/03/18 1630  ceFEPIme (MAXIPIME) 2 g in sodium chloride 0.9 % 100 mL IVPB     2 g 200 mL/hr over 30 Minutes Intravenous  Once 01/03/18 1627 01/03/18 1729

## 2018-01-04 NOTE — Assessment & Plan Note (Addendum)
On 12/25/17 he had agreed to hospice ereferral in the outpatient  Plan Inpatient pallaitive care consult for symptoms and goals (currently 30d mortality is very high) Tusssionex for cough

## 2018-01-05 DIAGNOSIS — J810 Acute pulmonary edema: Secondary | ICD-10-CM

## 2018-01-05 DIAGNOSIS — Z7189 Other specified counseling: Secondary | ICD-10-CM

## 2018-01-05 DIAGNOSIS — Z515 Encounter for palliative care: Secondary | ICD-10-CM

## 2018-01-05 LAB — CBC
HCT: 38.4 % — ABNORMAL LOW (ref 39.0–52.0)
HEMOGLOBIN: 13.4 g/dL (ref 13.0–17.0)
MCH: 31.8 pg (ref 26.0–34.0)
MCHC: 34.9 g/dL (ref 30.0–36.0)
MCV: 91 fL (ref 78.0–100.0)
PLATELETS: 150 10*3/uL (ref 150–400)
RBC: 4.22 MIL/uL (ref 4.22–5.81)
RDW: 15.4 % (ref 11.5–15.5)
WBC: 14.1 10*3/uL — ABNORMAL HIGH (ref 4.0–10.5)

## 2018-01-05 LAB — LEGIONELLA PNEUMOPHILA SEROGP 1 UR AG: L. pneumophila Serogp 1 Ur Ag: NEGATIVE

## 2018-01-05 LAB — BASIC METABOLIC PANEL
ANION GAP: 11 (ref 5–15)
BUN: 30 mg/dL — ABNORMAL HIGH (ref 6–20)
CHLORIDE: 93 mmol/L — AB (ref 101–111)
CO2: 31 mmol/L (ref 22–32)
Calcium: 8.6 mg/dL — ABNORMAL LOW (ref 8.9–10.3)
Creatinine, Ser: 1.27 mg/dL — ABNORMAL HIGH (ref 0.61–1.24)
GFR calc Af Amer: >60 mL/min (ref >60)
GFR, EST NON AFRICAN AMERICAN: 53 mL/min — AB (ref >60)
Glucose, Bld: 200 mg/dL — ABNORMAL HIGH (ref 65–99)
POTASSIUM: 2.7 mmol/L — AB (ref 3.5–5.1)
SODIUM: 135 mmol/L (ref 135–145)

## 2018-01-05 LAB — GLUCOSE, CAPILLARY
GLUCOSE-CAPILLARY: 144 mg/dL — AB (ref 65–99)
GLUCOSE-CAPILLARY: 241 mg/dL — AB (ref 65–99)
Glucose-Capillary: 136 mg/dL — ABNORMAL HIGH (ref 65–99)
Glucose-Capillary: 148 mg/dL — ABNORMAL HIGH (ref 65–99)
Glucose-Capillary: 169 mg/dL — ABNORMAL HIGH (ref 65–99)

## 2018-01-05 LAB — PHOSPHORUS: PHOSPHORUS: 3.7 mg/dL (ref 2.5–4.6)

## 2018-01-05 LAB — MAGNESIUM: MAGNESIUM: 2 mg/dL (ref 1.7–2.4)

## 2018-01-05 LAB — PROCALCITONIN: PROCALCITONIN: 0.68 ng/mL

## 2018-01-05 MED ORDER — NINTEDANIB ESYLATE 150 MG PO CAPS
150.0000 mg | ORAL_CAPSULE | Freq: Two times a day (BID) | ORAL | Status: DC
  Administered 2018-01-05 – 2018-01-11 (×12): 150 mg via ORAL
  Filled 2018-01-05 (×12): qty 1

## 2018-01-05 MED ORDER — INSULIN ASPART 100 UNIT/ML ~~LOC~~ SOLN
0.0000 [IU] | Freq: Three times a day (TID) | SUBCUTANEOUS | Status: DC
  Administered 2018-01-05: 2 [IU] via SUBCUTANEOUS
  Administered 2018-01-05: 5 [IU] via SUBCUTANEOUS
  Administered 2018-01-06: 2 [IU] via SUBCUTANEOUS
  Administered 2018-01-06: 5 [IU] via SUBCUTANEOUS
  Administered 2018-01-06: 3 [IU] via SUBCUTANEOUS
  Administered 2018-01-07 (×2): 2 [IU] via SUBCUTANEOUS
  Administered 2018-01-08: 3 [IU] via SUBCUTANEOUS
  Administered 2018-01-08: 2 [IU] via SUBCUTANEOUS
  Administered 2018-01-08 – 2018-01-09 (×2): 3 [IU] via SUBCUTANEOUS
  Administered 2018-01-09 – 2018-01-10 (×3): 2 [IU] via SUBCUTANEOUS
  Administered 2018-01-10: 3 [IU] via SUBCUTANEOUS
  Administered 2018-01-11: 2 [IU] via SUBCUTANEOUS

## 2018-01-05 MED ORDER — POTASSIUM CHLORIDE CRYS ER 20 MEQ PO TBCR
40.0000 meq | EXTENDED_RELEASE_TABLET | Freq: Once | ORAL | Status: AC
  Administered 2018-01-05: 40 meq via ORAL
  Filled 2018-01-05: qty 2

## 2018-01-05 MED ORDER — FUROSEMIDE 10 MG/ML IJ SOLN
40.0000 mg | Freq: Three times a day (TID) | INTRAMUSCULAR | Status: DC
  Administered 2018-01-05: 40 mg via INTRAVENOUS
  Filled 2018-01-05: qty 4

## 2018-01-05 MED ORDER — POTASSIUM CHLORIDE CRYS ER 20 MEQ PO TBCR
40.0000 meq | EXTENDED_RELEASE_TABLET | Freq: Three times a day (TID) | ORAL | Status: AC
  Administered 2018-01-05 (×2): 40 meq via ORAL
  Filled 2018-01-05 (×2): qty 2

## 2018-01-05 NOTE — Progress Notes (Signed)
Patient is back on bipap at this time due to increased WOB and O2 saturation dropping into the mid 80's.

## 2018-01-05 NOTE — Progress Notes (Signed)
   01/05/18 1400  Clinical Encounter Type  Visited With Patient;Patient and family together  Visit Type Initial  Referral From Chaplain  Consult/Referral To Chaplain  Spiritual Encounters  Spiritual Needs Prayer;Emotional  Stress Factors  Patient Stress Factors Exhausted  Family Stress Factors Exhausted    Pt was with about three staff members helping him to sit. Pt's wife on the hallway in emotional distress. Chaplain, pt's wife and friend entered and found pt with oxygen mask on. Chaplain had a brief conversation with pt, wife and friend. All were very receptive and appreciative. Chaplain provided empathic reflective listening, compassionate presence and prayer.  Lyam Provencio a Water quality scientist, [employer]

## 2018-01-05 NOTE — Progress Notes (Signed)
RT has patient on his heated HFNC at this time and he appears to be tolerating well. Oxygen level is currently 94%.

## 2018-01-05 NOTE — Consult Note (Signed)
Consultation Note Date: 01/05/2018   Patient Name: Jeffrey Cobb  DOB: July 10, 1941  MRN: [id number]  Age / Sex: 77 y.o., male  PCP: Daisy Floro, MD Referring Physician: Carin Hock, MD  Reason for Consultation: Establishing goals of care and Psychosocial/spiritual support  HPI/Patient Profile: 77 y.o. 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 Found to be in Acute on Chronic Hypoxic Respiratory Failure and was started on NIPPV. Transferred to [facility].  Currently in ICU on BiPap, family at bedside.   Family report continued physical and functional decline over the past 6 months.  "He doesn't go out much at all"  Currently on a trail drug Ofev 150 mg po bid thru Dr Ulyses Jarred  office for IPF.  Patient and family face treatment option decisions, advanced directive decisions, and anticipatory care needs.   Clinical Assessment and Goals of Care:  This NP Lorinda Creed reviewed medical records, received report from team, assessed the patient and then meet at the patient's bedside along with his wife/Helen and daughter/Kelly  to discuss diagnosis, prognosis, GOC, EOL wishes disposition and options.  Concept of Hospice and Palliative Care were discussed  A detailed discussion was had today regarding advanced directives.  Concepts specific to code status, artifical feeding and hydration, continued IV antibiotics and rehospitalization was had.  The difference between a aggressive medical intervention path  and a palliative comfort care path for this patient at this time was had.  Values and goals of care important to patient and family were attempted to be elicited.  MOST form introduced  Natural trajectory and expectations at EOL were discussed.  Questions and concerns addressed.   Family encouraged to call with questions or concerns.    PMT  will continue to support holistically.   HCPOA    SUMMARY OF RECOMMENDATIONS    Code Status/Advance Care Planning:  Limited code        -no intubation    Palliative Prophylaxis:   Aspiration, Delirium Protocol and Oral Care  Additional Recommendations (Limitations, Scope, Preferences):  Treat the treatable, hopeful for Improvement. Ultimately patient would like to return home, they are open to hospice services.    Comfort and dignity are priority.  Will need continued support navigating heath care decisions   Psycho-social/Spiritual:   Very supportive family, wife of 20 + years, daughter lives in [place], son lives four hours a way.  Patient is a retired Museum/gallery conservator office of [employer]   Desire for further [employer] support:yes  Additional Recommendations: Education on Hospice  Prognosis:   Unable to determine- will depend on decision for life prolong measures.  Discharge Planning: To Be Determined      Primary Diagnoses: Present on Admission: . Respiratory failure with hypoxia and hypercapnia (HCC) . Acute on chronic respiratory failure with hypoxemia (HCC) . RLS (restless legs syndrome)   I have reviewed the medical record, interviewed the patient and family, and examined the patient. The  following aspects are pertinent.  Past Medical History:  Diagnosis Date  . Acute sinusitis, unspecified   . Arthritis   . 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 GERD (gastroesophageal reflux disease)   . Left anterior fascicular block    hx of on ekg  . Mixed hyperlipidemia   . Obstructive chronic bronchitis with exacerbation (HCC)   . Occlusion and stenosis of carotid artery without mention of cerebral infarction    a. 05/2013 Carotid U/S: 1-39% bilat stenosis.  . Other emphysema (HCC)   . 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 Pulmonary fibrosis (HCC)   . Sleep apnea     does not use cpap due to cough  . Stroke [facility]) 05/2015   Social History   Socioeconomic History  . Marital status: Married    Spouse name: Myriam Jacobson   . Number of children: 2  . Years of education: Assoc   . Highest education level: Not on file  Occupational History  . Occupation: Retired    Associate Professor: RETIRED  Social Needs  . Financial resource strain: Not hard at all  . Food insecurity:    Worry: Never true    Inability: Never true  . Transportation needs:    Medical: No    Non-medical: No  Tobacco Use  . Smoking status: Former Smoker    Packs/day: 3.00    Years: 17.00    Pack years: 51.00    Types: Cigarettes    Last attempt to quit: 08/26/1977    Years since quitting: 40.3  . Smokeless tobacco: Never Used  Substance and Sexual Activity  . Alcohol use: Yes    Comment: rarely  . Drug use: No  . Sexual activity: Not on file  Lifestyle  . Physical activity:    Days per week: 2 days    Minutes per session: 30 min  . Stress: Not at all  Relationships  . Social connections:    Talks on phone: Three times a week    Gets together: Once a week    Attends religious service: Never    Active member of club or organization: Yes    Attends meetings of clubs or organizations: Never    Relationship status: Married  Other Topics Concern  . Not on file  Social History Narrative   Patient is married Myriam Jacobson) and lives at home with his wife.   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    Patient is left handed   Patient drinks 1-2 cups of coffee in the morning and 3-4 cups of tea daily.               Family History  Problem Relation Age of Onset  . Emphysema Mother   . Heart disease Mother   . Alzheimer's disease Mother   . Heart disease Father   . Stroke Paternal Grandfather   . Allergies Unknown        FH: FATHER,2 SISTER,1 BROTHER, SON  DAUGHTER  . Cancer Sister    Scheduled Meds: . furosemide  40 mg Intravenous Q8H  . heparin   5,000 Units Subcutaneous Q8H  . methylPREDNISolone (SOLU-MEDROL) injection  80 mg Intravenous Q6H  . potassium chloride  40 mEq Oral TID  . pramipexole  0.5 mg Oral BID  . QUEtiapine  100 mg Oral QHS   Continuous Infusions: . sodium chloride    .  azithromycin Stopped (01/04/18 2125)  . ceFEPime (MAXIPIME) IV Stopped (01/05/18 0549)  . famotidine (PEPCID) IV Stopped (01/05/18 1038)  . vancomycin 1,000 mg (01/05/18 1109)   PRN Meds:.sodium chloride, chlorpheniramine-HYDROcodone Medications Prior to Admission:  Prior to Admission medications   Medication Sig Start Date End Date Taking? Authorizing Provider  albuterol (PROVENTIL) (2.5 MG/3ML) 0.083% nebulizer solution Take 3 mLs (2.5 mg total) by nebulization every 6 (six) hours as needed for wheezing or shortness of breath. 06/20/17   Lupita Leash, MD  aspirin 81 MG tablet Take 81 mg by mouth daily.    [provider]  atorvastatin (LIPITOR) 40 MG tablet Take 1 tablet (40 mg total) by mouth daily at 6 PM. 06/01/15   Ghimire, Werner Lean, MD  chlorpheniramine-HYDROcodone (TUSSIONEX PENNKINETIC ER) 10-8 MG/5ML SUER Take 5 mLs by mouth every 12 (twelve) hours as needed for cough. Patient not taking: Reported on 12/25/2017 11/07/17   Lupita Leash, MD  chlorpheniramine-HYDROcodone [facility] PENNKINETIC ER) 10-8 MG/5ML SUER Take 5 mLs by mouth every 12 (twelve) hours as needed for cough. 12/25/17   Bevelyn Ngo, NP  cyanocobalamin 500 MCG tablet Take 1,000 mcg by mouth daily.    [provider]  doxycycline (VIBRA-TABS) 100 MG tablet Take 1 tablet (100 mg total) by mouth 2 (two) times daily. Patient not taking: Reported on 12/25/2017 09/24/17   Lupita Leash, MD  escitalopram (LEXAPRO) 20 MG tablet Take 1 tablet (20 mg total) by mouth daily. 12/25/17   Arfeen, Phillips Grout, MD  furosemide (LASIX) 40 MG tablet Take daily as needed for leg swelling. Patient not taking: Reported on 12/25/2017 11/07/17   Lupita Leash, MD    furosemide (LASIX) 40 MG tablet TAKE 1 TABLET BY MOUTH EVERY DAY 01/02/18   Lupita Leash, MD  guaiFENesin (MUCINEX) 600 MG 12 hr tablet Take 1 tablet (600 mg total) by mouth 2 (two) times daily. 09/24/17   Lupita Leash, MD  Nintedanib (OFEV) 150 MG CAPS Take 150 mg by mouth 2 (two) times daily. 12/19/16   Lupita Leash, MD  oxymetazoline (AFRIN) 0.05 % nasal spray Place 1 spray into both nostrils as needed for congestion.    [provider]  pramipexole (MIRAPEX) 0.5 MG tablet TAKE 1 TABLET BY MOUTH EVERY MORNING AND TAKE 1 TABLET EVERY EVENING 08/15/17   Nilda Riggs, NP  predniSONE (DELTASONE) 10 MG tablet  x7days,20mg  x 7 days. Then stop. Patient not taking: Reported on 12/25/2017 12/11/17   Lupita Leash, MD  predniSONE (DELTASONE) 5 MG tablet Take 4 tabs for 7 days, take 2 tabs for 7 days, then 1 tab for 7 days, then stop. 12/25/17   Bevelyn Ngo, NP  PROAIR HFA 108 (90 BASE) MCG/ACT inhaler Inhale 2 puffs into the lungs every 6 (six) hours as needed. 01/28/14   [provider]  QUEtiapine (SEROQUEL) 100 MG tablet  02/27/17   [provider]  sodium chloride HYPERTONIC 3 % nebulizer solution Take by nebulization 2 (two) times daily as needed for other. 06/27/17   Lupita Leash, MD  triamcinolone cream (KENALOG) 0.1 % APPLY 1 APPLICATION ON THE SKIN TWICE A DAY AS NEEDED 11/07/16   [provider]   Allergies  Allergen Reactions  . Gabapentin     Dizziness   . Sulfonamide Derivatives Rash  . Trazodone And Nefazodone Rash and Other (See Comments)    Hallucinations    Review of Systems  Unable to perform ROS:  Acuity of condition    Physical Exam  Constitutional: He is oriented to person, place, and time. He appears well-developed.  Cardiovascular: Normal rate, regular rhythm and normal heart sounds.  Pulmonary/Chest: Tachypnea noted.  Currently on BiPap  Neurological: He is alert and oriented to person, place, and  time.  Skin: Skin is warm and dry.    Vital Signs: BP 130/86   Pulse 94   Temp 98.1 F (36.7 C) (Axillary)   Resp (!) 24   Ht  (1.702 m)   Wt 92.1 kg (203 lb 0.7 oz)   SpO2 91%   BMI 31.80 kg/m  Pain Scale: 0-10   Pain Score: 0-No pain   SpO2: SpO2: 91 % O2 Device:SpO2: 91 % O2 Flow Rate: .O2 Flow Rate (L/min): 35 L/min  IO: Intake/output summary:   Intake/Output Summary (Last 24 hours) at 01/05/2018 1154 Last data filed at 01/05/2018 1100 Gross per 24 hour  Intake 1685 ml  Output 4100 ml  Net -2415 ml    LBM: Last BM Date: 01/03/18 Baseline Weight: Weight: 93.4 kg (206 lb) Most recent weight: Weight: 92.1 kg (203 lb 0.7 oz)     Palliative Assessment/Data:  PTA   60%  Now 30%    Discussed with Dr Molli Knock  Time In: 1045 Time Out: 1200 Time Total: 75 minutes Greater than 50%  of this time was spent counseling and coordinating care related to the above assessment and plan.  Signed by: Lorinda Creed, NP   Please contact Palliative Medicine Team phone at [phone] for questions and concerns.  For individual provider: See Loretha Stapler

## 2018-01-05 NOTE — Progress Notes (Signed)
K+ 2.7 called to Dr Molli Knock.Potassium replacements started per orders

## 2018-01-05 NOTE — Progress Notes (Signed)
Report called to Victorino Dike [id number] however patient is eating dinner at present .Will transfer him after he is finished

## 2018-01-05 NOTE — Progress Notes (Signed)
.. ..  Name: Jeffrey Cobb MRN: [id number] DOB: 05/01/1941    ADMISSION DATE:  01/03/2018 CONSULTATION DATE:  01/03/2018  REFERRING MD :  [facility]- MAYO MD    BRIEF  77 year old male with a past medical history of idiopathic pulmonary fibrosis, hypertension, GERD, dyslipidemia, TIA, pulmonary nodule, RLS, OSA on CPAP, resents to [name] ICU from [facility] after being evaluated for severe dyspnea.  His usual baseline is oxygen requirements of 8 L nasal cannula.  And he was recently evaluated for bronchitis and started on doxycycline and prednisone as an outpatient.  He states over the last several days he has been having worsening shortness of breath and today he turned blue and was unable to walk. On arrival to [facility] he was febrile, cyanotic, sats 70% on nonrebreather using accessory muscles and only able to do one-word sentences.  He was placed on noninvasive positive pressure ventilation, lactate 3.28, patient received nebs, steroids, magnesium, antibiotics.  PCCM was called for admission of acute hypoxic respiratory failure secondary to idiopathic pulmonary fibrosis exacerbation.  On my evaluation of the patient he is alert, speaking in full sentences, continues on noninvasive positive pressure ventilation in no visible distress saturations 93 to 95%. His wife is at bedside. He reiterates the story that he gave to [facility] on presentation.  He follows with Dr. [name] as an outpatient.  Both him and his wife stated that the patient has been in the process of clarifying his goals of care and end-of-life wishes.  He has met with palliative care/hospice.  I asked the patient in the event that our current interventions were not successful what would he want, he stated that he does not want to be intubated and placed on mechanical ventilation,  in the event that his heart should stop he does not want CPR/ACLS, he is okay with noninvasive  positive pressure ventilation or heated high flow nasal cannula.  SIGNIFICANT EVENTS  SOB- NIPPV improved  STUDIES:  CXR: pulm opacity increasing noted in left mid lung CT CHEST W/O CON: confirms GGO no focal consolidation and 37m nodule seen in upper lobe  ABG: 7.36/50/226/30 ( post intervention, no pre intervention gas) LA 3.28--. 0.71  SUBJECTIVE:  Felt more SOB and asked for BiPAP again this AM, no new complaints  VITAL SIGNS: Temp:  [97.5 F (36.4 C)-98.4 F (36.9 C)] 97.5 F (36.4 C) (05/13 0755) Pulse Rate:  [45-103] 96 (05/13 1100) Resp:  [16-33] 29 (05/13 1100) BP: (100-148)/(60-89) 130/86 (05/13 1100) SpO2:  [78 %-96 %] 94 % (05/13 1100) FiO2 (%):  [80 %-100 %] 90 % (05/13 0757) Weight:  [203 lb 0.7 oz (92.1 kg)] 203 lb 0.7 oz (92.1 kg) (05/13 0500)  PHYSICAL EXAMINATION:  General Appearance:   Acute on chronically ill appearing male, moderate respiratory distress   Head:    [place]/AT, PERRL  Eyes:   PERRLA, EOM-I   Ears:    WNL  Nose:   WNL   Throat:  Clear, BiPAP in place  Neck:   Supple, no masses      Lungs:    Diffuse rales  Chest wall:    Intact  Heart:    RRR, Nl S1/S2 and -M/R/G  Abdomen:     Soft, NT, ND and +BS        Extremities:   -edema and -tenderness     Skin:   Intact     Neurologic:   Alert and  interactive, moving all ext to command   PULMONARY Recent Labs  Lab 01/03/18 1725  PHART 7.365  PCO2ART 50.0*  PO2ART 226.0*  HCO3 28.1*  TCO2 30  O2SAT 100.0   CBC Recent Labs  Lab 01/03/18 1633 01/04/18 0324  HGB 13.7 13.2  HCT 38.9* 38.2*  WBC 10.5 8.7  PLT 138* 132*   COAGULATION Recent Labs  Lab 01/03/18 1633  INR 1.10   CARDIAC   Recent Labs  Lab 01/03/18 2300 01/04/18 0324 01/04/18 0927  TROPONINI 0.08* 0.08* 0.06*   No results for input(s): PROBNP in the last 168 hours.  CHEMISTRY Recent Labs  Lab 01/03/18 1633 01/04/18 0324  NA 131* 135  K 3.5 3.5  CL 94* 97*  CO2 23 26  GLUCOSE 148* 145*  BUN 15 12   CREATININE 1.10 0.97  CALCIUM 8.3* 8.4*  MG  --  2.3  PHOS  --  3.7   Estimated Creatinine Clearance: 69 mL/min (by C-G formula based on SCr of 0.97 mg/dL).  LIVER Recent Labs  Lab 01/03/18 1633  AST 59*  ALT 40  ALKPHOS 81  BILITOT 2.3*  PROT 7.1  ALBUMIN 3.3*  INR 1.10   INFECTIOUS Recent Labs  Lab 01/03/18 1635 01/03/18 1824 01/03/18 2300 01/04/18 0324 01/05/18 0432  LATICACIDVEN 3.28* 0.71  --   --   --   PROCALCITON  --   --  0.59 1.06 0.68   ENDOCRINE CBG (last 3)  Recent Labs    01/05/18 0401 01/05/18 0753 01/05/18 1119  GLUCAP 136* 144* 241*   IMAGING x48h  - image(s) personally visualized  -   highlighted in bold Ct Chest Wo Contrast  Result Date: 01/03/2018 CLINICAL DATA:  Acute respiratory illness. History of chronic bronchitis/COPD, pulmonary fibrosis. EXAM: CT CHEST WITHOUT CONTRAST TECHNIQUE: Multidetector CT imaging of the chest was performed following the standard protocol without IV contrast. COMPARISON:  Chest radiograph Jan 03, 2018 and CT chest October 11, 2016 FINDINGS: CARDIOVASCULAR: Dense heart size is normal. Moderate coronary artery calcifications. No pericardial effusion. Thoracic aorta is normal course and caliber, mild calcific atherosclerosis. Chronically enlarged main pulmonary artery associated with pulmonary arterial hypertension. MEDIASTINUM/NODES: Mild mediastinal lymphadenopathy is similar to prior CT attributable to parenchymal process. LUNGS/PLEURA: Tracheobronchial tree is patent, no pneumothorax. Increasing ground-glass opacities throughout all lobes of the lungs. Worsening chronic interstitial lung disease most conspicuous in the lung bases. Mild bronchiectasis. Enlarging 7 mm pulmonary nodule RIGHT upper lobe (series 4, image 65) and 7 mm LEFT upper lobe pulmonary nodule (series 4, image 42). No pleural effusion or focal consolidation. UPPER ABDOMEN: Nonacute. Colonic intra positioning. 5.2 cm simple hepatic cyst segment 5/6.  Colonic diverticulosis. MUSCULOSKELETAL: Nonacute. Bilateral shoulder arthroplasties resultant streak artifact limiting evaluation of the thoracic inlet. Severe degenerative change of thoracic spine. IMPRESSION: 1. Increasing ground-glass opacities seen with infectious or inflammatory etiologies without focal consolidation. 2. Worsening chronic interstitial lung disease. 3. Enlarging bilateral upper lobe pulmonary nodules measuring 7 mm. Aortic Atherosclerosis (ICD10-I70.0). Electronically Signed   By: Elon Alas M.D.   On: 01/03/2018 22:54   Dg Chest Portable 1 View  Result Date: 01/03/2018 CLINICAL DATA:  Per nursing notes: Patient states that he has had SOB since yesterday - patient is on 8 Liters [place] and 64% - patient has increased WOB and SOB - patient has blue nail beds. Hx pulmonary fibrosis, emphysema EXAM: PORTABLE CHEST 1 VIEW COMPARISON:  12/25/2017 FINDINGS: Lung volumes are shallow. Heart size is normal. There are reticular changes  throughout the lungs bilaterally, consistent with known pulmonary fibrosis. Opacities are increased in the UPPER lobes and LEFT mid lung zone compared to prior study, raising the question of asymmetric pulmonary edema and/or infectious infiltrate. Status post bilateral shoulder arthroplasty. IMPRESSION: Increased pulmonary opacities, raising question of pulmonary edema and/or infection superimposed on known pulmonary fibrosis. Electronically Signed   By: Nolon Nations M.D.   On: 01/03/2018 16:57   ASSESSMENT / PLAN: Principal Problem:   Acute on chronic respiratory failure with hypoxemia (HCC) Active Problems:   RLS (restless legs syndrome)   Idiopathic pulmonary fibrosis (HCC)   Goals of care, counseling/discussion   Respiratory failure with hypoxia and hypercapnia ([place])   Community acquired pneumonia  Acute on chronic respiratory failure:  - BiPAP for WOB and comfort only at this point  ILD:   - Solumedrol 80 mg IV q6  - Family to bring Ofev  from home and pharmacy to dispense  GOC:  - DNR status  - BiPAP only for now  - If fails BiPAP then progress to comfort care  - Had extensive discussion with patient and family and confirmed code status to LCB, BiPAP only.  Pulmonary edema: improving  - Decrease lasix to 40 mg IV q8 x2 doses  - KVO IVF  - Strict I/O  Hypokalemia:  - K-dur ordered  - BMET in AM  - KVO IVF  DISPOSITION: move to sdu. CCM primary  PROGNOSIS: Guarded FAMILY: wife at bedside. Recently spoke with Hospice/Pall care about goals and they expressed the need to complete their directives. CODE STATUS: DNR DNI but is ok with NIPPV  The patient is critically ill with multiple organ systems failure and requires high complexity decision making for assessment and support, frequent evaluation and titration of therapies, application of advanced monitoring technologies and extensive interpretation of multiple databases.   Critical Care Time devoted to patient care services described in this note is  33  Minutes. This time reflects time of care of this signee Dr Jennet Maduro. This critical care time does not reflect procedure time, or teaching time or supervisory time of PA/NP/Med student/Med Resident etc but could involve care discussion time.  Rush Farmer, M.D. [facility] Pulmonary/Critical Care Medicine. Pager: [phone]. After hours pager: [phone].  01/05/2018 11:23 AM

## 2018-01-05 NOTE — Progress Notes (Signed)
Patient is off of bipap and back on the HFNC at this time.

## 2018-01-06 DIAGNOSIS — R739 Hyperglycemia, unspecified: Secondary | ICD-10-CM

## 2018-01-06 LAB — CBC
HCT: 36.6 % — ABNORMAL LOW (ref 39.0–52.0)
Hemoglobin: 12.3 g/dL — ABNORMAL LOW (ref 13.0–17.0)
MCH: 31 pg (ref 26.0–34.0)
MCHC: 33.6 g/dL (ref 30.0–36.0)
MCV: 92.2 fL (ref 78.0–100.0)
PLATELETS: 144 10*3/uL — AB (ref 150–400)
RBC: 3.97 MIL/uL — AB (ref 4.22–5.81)
RDW: 15.5 % (ref 11.5–15.5)
WBC: 14.2 10*3/uL — ABNORMAL HIGH (ref 4.0–10.5)

## 2018-01-06 LAB — BASIC METABOLIC PANEL
Anion gap: 10 (ref 5–15)
BUN: 33 mg/dL — AB (ref 6–20)
CO2: 30 mmol/L (ref 22–32)
CREATININE: 1.15 mg/dL (ref 0.61–1.24)
Calcium: 8.3 mg/dL — ABNORMAL LOW (ref 8.9–10.3)
Chloride: 94 mmol/L — ABNORMAL LOW (ref 101–111)
GFR calc Af Amer: >60 mL/min (ref >60)
GFR, EST NON AFRICAN AMERICAN: 60 mL/min — AB (ref >60)
GLUCOSE: 184 mg/dL — AB (ref 65–99)
POTASSIUM: 3.8 mmol/L (ref 3.5–5.1)
SODIUM: 134 mmol/L — AB (ref 135–145)

## 2018-01-06 LAB — GLUCOSE, CAPILLARY
GLUCOSE-CAPILLARY: 139 mg/dL — AB (ref 65–99)
GLUCOSE-CAPILLARY: 184 mg/dL — AB (ref 65–99)
Glucose-Capillary: 214 mg/dL — ABNORMAL HIGH (ref 65–99)
Glucose-Capillary: 134 mg/dL — ABNORMAL HIGH (ref 65–99)
Glucose-Capillary: 201 mg/dL — ABNORMAL HIGH (ref 65–99)

## 2018-01-06 LAB — EXPECTORATED SPUTUM ASSESSMENT W GRAM STAIN, RFLX TO RESP C

## 2018-01-06 LAB — PHOSPHORUS: Phosphorus: 2.9 mg/dL (ref 2.5–4.6)

## 2018-01-06 LAB — MAGNESIUM: MAGNESIUM: 2 mg/dL (ref 1.7–2.4)

## 2018-01-06 LAB — EXPECTORATED SPUTUM ASSESSMENT W REFEX TO RESP CULTURE

## 2018-01-06 MED ORDER — FUROSEMIDE 10 MG/ML IJ SOLN
40.0000 mg | Freq: Three times a day (TID) | INTRAMUSCULAR | Status: AC
  Administered 2018-01-06 (×2): 40 mg via INTRAVENOUS
  Filled 2018-01-06 (×2): qty 4

## 2018-01-06 MED ORDER — POTASSIUM CHLORIDE CRYS ER 20 MEQ PO TBCR
40.0000 meq | EXTENDED_RELEASE_TABLET | Freq: Three times a day (TID) | ORAL | Status: AC
  Administered 2018-01-06 (×2): 40 meq via ORAL
  Filled 2018-01-06 (×2): qty 2

## 2018-01-06 NOTE — Progress Notes (Signed)
.. ..  Name: Jeffrey Cobb MRN: [id number] DOB: 1941/02/11    ADMISSION DATE:  01/03/2018 CONSULTATION DATE:  01/03/2018  REFERRING MD :  [facility]- MAYO MD    BRIEF  77 year old male with a past medical history of idiopathic pulmonary fibrosis, hypertension, GERD, dyslipidemia, TIA, pulmonary nodule, RLS, OSA on CPAP, resents to [facility], ICU from [facility] after being evaluated for severe dyspnea.  His usual baseline is oxygen requirements of 8 L nasal cannula.  And he was recently evaluated for bronchitis and started on doxycycline and prednisone as an outpatient.  He states over the last several days he has been having worsening shortness of breath and today he turned blue and was unable to walk. On arrival to [name] he was febrile, cyanotic, sats 70% on nonrebreather using accessory muscles and only able to do one-word sentences.  He was placed on noninvasive positive pressure ventilation, lactate 3.28, patient received nebs, steroids, magnesium, antibiotics.  PCCM was called for admission of acute hypoxic respiratory failure secondary to idiopathic pulmonary fibrosis exacerbation.  On my evaluation of the patient he is alert, speaking in full sentences, continues on noninvasive positive pressure ventilation in no visible distress saturations 93 to 95%. His wife is at bedside. He reiterates the story that he gave to [name] on presentation.  He follows with Dr. [name] as an outpatient.  Both him and his wife stated that the patient has been in the process of clarifying his goals of care and end-of-life wishes.  He has met with palliative care/hospice.  I asked the patient in the event that our current interventions were not successful what would he want, he stated that he does not want to be intubated and placed on mechanical ventilation,  in the event that his heart should stop he does not want CPR/ACLS, he is okay with noninvasive  positive pressure ventilation or heated high flow nasal cannula.  SIGNIFICANT EVENTS  SOB- NIPPV improved  STUDIES:  CXR: pulm opacity increasing noted in left mid lung CT CHEST W/O CON: confirms GGO no focal consolidation and 6m nodule seen in upper lobe  ABG: 7.36/50/226/30 ( post intervention, no pre intervention gas) LA 3.28--. 0.71  SUBJECTIVE:  Did not require BiPAP overnight, currently on HFNC at 80%  VITAL SIGNS: Temp:  [97.5 F (36.4 C)-98.1 F (36.7 C)] 97.5 F (36.4 C) (05/14 0748) Pulse Rate:  [73-100] 83 (05/14 0748) Resp:  [12-32] 12 (05/14 0748) BP: (103-142)/(56-94) 139/94 (05/14 0748) SpO2:  [88 %-100 %] 88 % (05/14 0748) FiO2 (%):  [70 %-90 %] 90 % (05/14 0307) Weight:  [201 lb 1 oz (91.2 kg)] 201 lb 1 oz (91.2 kg) (05/13 2046)  PHYSICAL EXAMINATION: General: chronically ill appearing male, NAD Neuro: alert and interactive, moving all ext to command HEENT: Jeffrey Cobb/AT, PERRL, EOM-I and MMM Hrt: RRR, Nl S1/S2 and -M/R/G Lung: Diffuse crackles Abdomen: Soft, NT, ND and +BS Ext: -edema and -tenderness Skin: intact  PULMONARY Recent Labs  Lab 01/03/18 1725  PHART 7.365  PCO2ART 50.0*  PO2ART 226.0*  HCO3 28.1*  TCO2 30  O2SAT 100.0   CBC Recent Labs  Lab 01/04/18 0324 01/05/18 1309 01/06/18 0242  HGB 13.2 13.4 12.3*  HCT 38.2* 38.4* 36.6*  WBC 8.7 14.1* 14.2*  PLT 132* 150 144*   COAGULATION Recent Labs  Lab 01/03/18 1633  INR 1.10   CARDIAC   Recent Labs  Lab 01/03/18 2300 01/04/18 0324 01/04/18  0927  TROPONINI 0.08* 0.08* 0.06*   No results for input(s): PROBNP in the last 168 hours.  CHEMISTRY Recent Labs  Lab 01/03/18 1633 01/04/18 0324 01/05/18 1309 01/06/18 0242  NA 131* 135 135 134*  K 3.5 3.5 2.7* 3.8  CL 94* 97* 93* 94*  CO2 23 26 31 30   GLUCOSE 148* 145* 200* 184*  BUN 15 12 30* 33*  CREATININE 1.10 0.97 1.27* 1.15  CALCIUM 8.3* 8.4* 8.6* 8.3*  MG  --  2.3 2.0 2.0  PHOS  --  3.7 3.7 2.9   Estimated  Creatinine Clearance: 57.9 mL/min (by C-G formula based on SCr of 1.15 mg/dL).  LIVER Recent Labs  Lab 01/03/18 1633  AST 59*  ALT 40  ALKPHOS 81  BILITOT 2.3*  PROT 7.1  ALBUMIN 3.3*  INR 1.10   INFECTIOUS Recent Labs  Lab 01/03/18 1635 01/03/18 1824 01/03/18 2300 01/04/18 0324 01/05/18 0432  LATICACIDVEN 3.28* 0.71  --   --   --   PROCALCITON  --   --  0.59 1.06 0.68   ENDOCRINE CBG (last 3)  Recent Labs    01/05/18 1119 01/05/18 1556 01/06/18 0156  GLUCAP 241* 148* 214*   IMAGING x48h  - image(s) personally visualized  -   highlighted in bold  I reviewed chest CT myself, fibrotic changes noted and on CXR pleural effusion noted   ASSESSMENT / PLAN: Principal Problem:   Acute on chronic respiratory failure with hypoxemia (HCC) Active Problems:   RLS (restless legs syndrome)   Idiopathic pulmonary fibrosis (HCC)   DNR (do not resuscitate) discussion   Respiratory failure with hypoxia and hypercapnia ([place])   Community acquired pneumonia   Palliative care by specialist  Discussed with PCCM-NP  Acute on chronic respiratory failure:  - Change BiPAP to QHS or while asleep  ILD:   - Solumedrol 80 mg IV q6  - Place back on Ofev, family brought it from home  GOC:  - LCB with BiPAP only and no further escalation of care  - If fails BiPAP then progress to comfort care  - Had extensive discussion with patient and family and confirmed code status to LCB, BiPAP only.  Pulmonary edema: improving  - Give 2 more doses of lasix  - KVO IVF  - Strict I/O  Hypokalemia:  - K-dur ordered, 40 PO x2 doses  - BMET in AM  - KVO IVF  Rush Farmer, M.D. [facility] Pulmonary/Critical Care Medicine. Pager: [phone]. After hours pager: [phone].  01/06/2018 11:19 AM

## 2018-01-06 NOTE — Care Management Note (Addendum)
Case Management Note  Patient Details  Name: Jeffrey Cobb MRN: [id number] Date of Birth: May 24, 1941  Subjective/Objective:    From home, acute on chronic resp failure,  ES pulmonary fibrosis Palliative following, Mary with Palliative will see family again tomorrow. Family has stated an interest in home with hospice to RN, but patient will have to be down to 10 liters of oxygen to go home with hospice.     5/17 1200 Letha Cape RN, BSN -NCM was informed to offer choice to patient for home hospice by MD,  NCM spoke with patient gave him the [facility] list, he chose [facility], referral given to Amy with [facility].  Informed her that NCM spoke with [facility] yesterday about getting a NIV for patient and she checked with someone and called this NCM back and stated that hospice would be able to do the NIV but he will have to be on 10 liters.  This NCM informed MD of this information on 5/16.  Amy with [facility] said she will have to check into today and get back with me.  [facility] unable to take referral.  1606 Letha Cape RN, BSN- per Melody with Palliative , [facility] able to take patient and they will provide the bipap, patient should be ready to dc tomorrow after the bipap has been delivered to patients home and set up with their respiratory rep.  Patient is now on 8 liters [place], which he normally uses 10 at home thru [employer].  He will be able to ride in the car for transport per wife and the family.  Prior to dc make sure the bipap is at patient's home before he is discharged.    NCM confirmed with [facility] the referral that was given for home hospice.                Action/Plan: Hopeful to DC home with [facility] 5/18.  Expected Discharge Date:                  Expected Discharge Plan:  Home w Hospice Care  In-House Referral:     Discharge planning Services  CM Consult  Post Acute Care Choice:    Choice offered to:     DME Arranged:    DME  Agency:     HH Arranged:    HH Agency:     Status of Service:  In process, will continue to follow  If discussed at Long Length of Stay Meetings, dates discussed:    Additional Comments:  Leone Haven, RN 01/06/2018, 3:28 PM

## 2018-01-07 ENCOUNTER — Inpatient Hospital Stay (HOSPITAL_COMMUNITY): Payer: Medicare Other

## 2018-01-07 LAB — GLUCOSE, CAPILLARY
Glucose-Capillary: 195 mg/dL — ABNORMAL HIGH (ref 65–99)
Glucose-Capillary: 137 mg/dL — ABNORMAL HIGH (ref 65–99)
Glucose-Capillary: 137 mg/dL — ABNORMAL HIGH (ref 65–99)
Glucose-Capillary: 179 mg/dL — ABNORMAL HIGH (ref 65–99)

## 2018-01-07 LAB — CBC
HCT: 38.8 % — ABNORMAL LOW (ref 39.0–52.0)
Hemoglobin: 13.1 g/dL (ref 13.0–17.0)
MCH: 31.1 pg (ref 26.0–34.0)
MCHC: 33.8 g/dL (ref 30.0–36.0)
MCV: 92.2 fL (ref 78.0–100.0)
PLATELETS: 147 10*3/uL — AB (ref 150–400)
RBC: 4.21 MIL/uL — ABNORMAL LOW (ref 4.22–5.81)
RDW: 15 % (ref 11.5–15.5)
WBC: 10.7 10*3/uL — ABNORMAL HIGH (ref 4.0–10.5)

## 2018-01-07 LAB — BASIC METABOLIC PANEL
Anion gap: 10 (ref 5–15)
BUN: 32 mg/dL — ABNORMAL HIGH (ref 6–20)
CO2: 30 mmol/L (ref 22–32)
CREATININE: 1.42 mg/dL — AB (ref 0.61–1.24)
Calcium: 8.3 mg/dL — ABNORMAL LOW (ref 8.9–10.3)
Chloride: 94 mmol/L — ABNORMAL LOW (ref 101–111)
GFR calc non Af Amer: 46 mL/min — ABNORMAL LOW (ref >60)
GFR, EST AFRICAN AMERICAN: 53 mL/min — AB (ref >60)
Glucose, Bld: 143 mg/dL — ABNORMAL HIGH (ref 65–99)
Potassium: 3.6 mmol/L (ref 3.5–5.1)
SODIUM: 134 mmol/L — AB (ref 135–145)

## 2018-01-07 LAB — PHOSPHORUS: PHOSPHORUS: 3.6 mg/dL (ref 2.5–4.6)

## 2018-01-07 LAB — MAGNESIUM: MAGNESIUM: 2 mg/dL (ref 1.7–2.4)

## 2018-01-07 MED ORDER — CEFEPIME HCL 2 G IJ SOLR
2.0000 g | INTRAMUSCULAR | Status: DC
  Administered 2018-01-08 – 2018-01-09 (×2): 2 g via INTRAVENOUS
  Filled 2018-01-07 (×3): qty 2

## 2018-01-07 MED ORDER — ALPRAZOLAM 0.25 MG PO TABS
0.2500 mg | ORAL_TABLET | Freq: Two times a day (BID) | ORAL | Status: DC | PRN
Start: 2018-01-07 — End: 2018-01-11
  Administered 2018-01-07 – 2018-01-09 (×4): 0.25 mg via ORAL
  Filled 2018-01-07 (×4): qty 1

## 2018-01-07 NOTE — Progress Notes (Signed)
Patient ID: Jeffrey Cobb, male   DOB: 04/19/1941, 77 y.o.   MRN: [id number]  This NP visited patient at the bedside as a follow up to  [facility] meeting, wife at bedside.  Continued conversation regarding diagnosis, prognosis, treatment options decisions specific to HFNC vs [place].  He was on 10 liters [place] at home prior to admission.   We discussed limitation of care options 2/2 to HFNC.  Patient was surprised at the discussed limited prognosis of less than a few weeks if he is weaned off HFNC and shifts to a full comfort path of only nasal cannula, symptom management and no escalation of care.  Wife asks very appropriate questions and has full understanding of the seriousness of the situation.  For now patient wishes to continue to treat the treatable while he and his family process situation and their decisions and options.  Ultimately he wishes to return home if possible.  Detailed hospice benefit as it relates to home with hospice vs residential hospice/[facility].  Discussed with family the importance of continued conversation with each other and their  medical providers regarding overall plan of care and treatment options,  ensuring decisions are within the context of the patients values and GOCs.  Questions and concerns addressed   Discussed with bedsdie RN  Total time spent on the unit was 35 minutes    PMT will continue to support holistically  Greater than 50% of the time was spent in counseling and coordination of care  Lorinda Creed NP  Palliative Medicine Team Team Phone # [phone] Pager [phone]

## 2018-01-07 NOTE — Progress Notes (Signed)
.. ..  Name: Jeffrey Cobb MRN: [id number] DOB: 1940/11/26    ADMISSION DATE:  01/03/2018 CONSULTATION DATE:  01/03/2018  REFERRING MD :  [facility] MAYO MD    BRIEF  77 year old male with a past medical history of idiopathic pulmonary fibrosis, hypertension, GERD, dyslipidemia, TIA, pulmonary nodule, RLS, OSA on CPAP, resents to [name], ICU from [facility] after being evaluated for severe dyspnea.  His usual baseline is oxygen requirements of 8 L nasal cannula.  And he was recently evaluated for bronchitis and started on doxycycline and prednisone as an outpatient.  He states over the last several days he has been having worsening shortness of breath and today he turned blue and was unable to walk. On arrival to [facility] he was febrile, cyanotic, sats 70% on nonrebreather using accessory muscles and only able to do one-word sentences.  He was placed on noninvasive positive pressure ventilation, lactate 3.28, patient received nebs, steroids, magnesium, antibiotics.  PCCM was called for admission of acute hypoxic respiratory failure secondary to idiopathic pulmonary fibrosis exacerbation.  On my evaluation of the patient he is alert, speaking in full sentences, continues on noninvasive positive pressure ventilation in no visible distress saturations 93 to 95%. His wife is at bedside. He reiterates the story that he gave to [facility] on presentation.  He follows with Dr. [name] as an outpatient.  Both him and his wife stated that the patient has been in the process of clarifying his goals of care and end-of-life wishes.  He has met with palliative care/hospice.  I asked the patient in the event that our current interventions were not successful what would he want, he stated that he does not want to be intubated and placed on mechanical ventilation,  in the event that his heart should stop he does not want CPR/ACLS, he is okay with noninvasive  positive pressure ventilation or heated high flow nasal cannula.  SIGNIFICANT EVENTS  SOB- NIPPV improved  STUDIES:  CXR: pulm opacity increasing noted in left mid lung CT CHEST W/O CON: confirms GGO no focal consolidation and 13m nodule seen in upper lobe  ABG: 7.36/50/226/30 ( post intervention, no pre intervention gas) LA 3.28--. 0.71  SUBJECTIVE:  Did not require BiPAP overnight, currently on HFNC at 80%  VITAL SIGNS: Temp:  [97.5 F (36.4 C)-98 F (36.7 C)] 97.5 F (36.4 C) (05/15 0747) Pulse Rate:  [70-96] 93 (05/15 0858) Resp:  [16-28] 25 (05/15 0858) BP: (133-153)/(75-95) 153/85 (05/15 0747) SpO2:  [83 %-94 %] 93 % (05/15 0858) FiO2 (%):  [70 %-80 %] 80 % (05/15 0858)  PHYSICAL EXAMINATION: General: chronically ill appearing male, NAD Neuro: alert and interactive, moving all ext to command HEENT: /AT, PERRL, EOM-I and MMM Hrt: RRR, Nl S1/S2 and -M/R/G Lung: Diffuse crackles Abdomen: Soft, NT, ND and +BS Ext: -edema and -tenderness Skin: intact  PULMONARY Recent Labs  Lab 01/03/18 1725  PHART 7.365  PCO2ART 50.0*  PO2ART 226.0*  HCO3 28.1*  TCO2 30  O2SAT 100.0   CBC Recent Labs  Lab 01/05/18 1309 01/06/18 0242 01/07/18 0217  HGB 13.4 12.3* 13.1  HCT 38.4* 36.6* 38.8*  WBC 14.1* 14.2* 10.7*  PLT 150 144* 147*   COAGULATION Recent Labs  Lab 01/03/18 1633  INR 1.10   CARDIAC   Recent Labs  Lab 01/03/18 2300 01/04/18 0324 01/04/18 0927  TROPONINI 0.08* 0.08* 0.06*   No results for input(s): PROBNP in the last  168 hours.  CHEMISTRY Recent Labs  Lab 01/03/18 1633 01/04/18 0324 01/05/18 1309 01/06/18 0242 01/07/18 0217  NA 131* 135 135 134* 134*  K 3.5 3.5 2.7* 3.8 3.6  CL 94* 97* 93* 94* 94*  CO2 23 26 31 30 30   GLUCOSE 148* 145* 200* 184* 143*  BUN 15 12 30* 33* 32*  CREATININE 1.10 0.97 1.27* 1.15 1.42*  CALCIUM 8.3* 8.4* 8.6* 8.3* 8.3*  MG  --  2.3 2.0 2.0 2.0  PHOS  --  3.7 3.7 2.9 3.6   Estimated Creatinine  Clearance: 46.9 mL/min (A) (by C-G formula based on SCr of 1.42 mg/dL (H)).  LIVER Recent Labs  Lab 01/03/18 1633  AST 59*  ALT 40  ALKPHOS 81  BILITOT 2.3*  PROT 7.1  ALBUMIN 3.3*  INR 1.10   INFECTIOUS Recent Labs  Lab 01/03/18 1635 01/03/18 1824 01/03/18 2300 01/04/18 0324 01/05/18 0432  LATICACIDVEN 3.28* 0.71  --   --   --   PROCALCITON  --   --  0.59 1.06 0.68   ENDOCRINE CBG (last 3)  Recent Labs    01/06/18 1636 01/06/18 2333 01/07/18 0843  GLUCAP 201* 139* 195*   Dg Chest Port 1 View  Result Date: 01/07/2018 CLINICAL DATA:  Acute respiratory failure with hypoxemia EXAM: PORTABLE CHEST 1 VIEW COMPARISON:  01/03/2018 FINDINGS: Low lung volumes with diffuse pulmonary fibrosis. Improvement in diffuse bilateral airspace disease since prior study. No effusion. Heart size normal. IMPRESSION: COPD with pulmonary fibrosis. Interval improvement in bilateral edema/pneumonia since the prior study. Electronically Signed   By: Franchot Gallo M.D.   On: 01/07/2018 10:07     ASSESSMENT / PLAN: Principal Problem:   Acute on chronic respiratory failure with hypoxemia (HCC) Active Problems:   RLS (restless legs syndrome)   Idiopathic pulmonary fibrosis (HCC)   DNR (do not resuscitate) discussion   Respiratory failure with hypoxia and hypercapnia ([place])   Community acquired pneumonia   Palliative care by specialist   Acute on chronic respiratory failure - slightly improved.   ILD  Pulmonary edema - slightly improved.  NEG 5L  PLAN -  Continue bipap qhs and PRN  Continue solumedrol  Continue Ofev  Hold further lasix for now Intermittent f/u CXR  Wean O2 as able  LCB, DNI  See discussion below    Hypokalemia: PLAN -  Replete K PRN  F/u chem   Discussed at length with pt, family at bedside.  He is a bit better today although still requiring HFNC.  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  Does not want to go to [facility].   Waiting for a son to visit and participate in conversation.  Overall plan is to continue current rx and try to improve resp status as best we can with no escalation, comfort if worsens.  Family to bring his painting supplies to hospital.  Appreciate palliative care input.        [name], NP 01/07/2018  10:28 AM Pager: [phone] or [phone]

## 2018-01-07 NOTE — Care Management Important Message (Signed)
Important Message  Patient Details  Name: Jeffrey Cobb MRN: [id number] Date of Birth: [birth year]   Medicare Important Message Given:  Yes    [name] Stefan Church 01/07/2018, 12:48 PM

## 2018-01-08 ENCOUNTER — Ambulatory Visit: Payer: Self-pay | Admitting: Acute Care

## 2018-01-08 DIAGNOSIS — Z515 Encounter for palliative care: Secondary | ICD-10-CM

## 2018-01-08 DIAGNOSIS — R0609 Other forms of dyspnea: Secondary | ICD-10-CM

## 2018-01-08 LAB — CULTURE, BLOOD (ROUTINE X 2)
Culture: NO GROWTH
Culture: NO GROWTH
Special Requests: ADEQUATE
Special Requests: ADEQUATE

## 2018-01-08 LAB — GLUCOSE, CAPILLARY
GLUCOSE-CAPILLARY: 126 mg/dL — AB (ref 65–99)
GLUCOSE-CAPILLARY: 170 mg/dL — AB (ref 65–99)
GLUCOSE-CAPILLARY: 184 mg/dL — AB (ref 65–99)

## 2018-01-08 LAB — CULTURE, RESPIRATORY W GRAM STAIN: Culture: NORMAL

## 2018-01-08 LAB — CULTURE, RESPIRATORY

## 2018-01-08 MED ORDER — MORPHINE SULFATE (CONCENTRATE) 10 MG/0.5ML PO SOLN
2.5000 mg | ORAL | Status: DC | PRN
  Administered 2018-01-09: 2.6 mg via ORAL
  Filled 2018-01-08 (×2): qty 0.5

## 2018-01-08 MED ORDER — FAMOTIDINE 20 MG PO TABS
20.0000 mg | ORAL_TABLET | Freq: Two times a day (BID) | ORAL | Status: DC
Start: 2018-01-08 — End: 2018-01-11
  Administered 2018-01-08 – 2018-01-11 (×6): 20 mg via ORAL
  Filled 2018-01-08 (×6): qty 1

## 2018-01-08 NOTE — Progress Notes (Signed)
Patient ID: Jeffrey Cobb, male   DOB: 1941/05/09, 77 y.o.   MRN: [id number]  This NP visited patient at the bedside as a follow up to  [facility] meeting, wife at bedside.  Patient looks good and "feels better" sitting OOB to chair.  Continued conversation regarding diagnosis, prognosis, treatment options decisions specific to HFNC vs [place], and how we can best transition him home with hospice services at maximum function and comfort.     Patient verbalizes that he understands the seriousness of his illness and that time is limited.  He plans to take "one day at a time".    He was on 10 liters  at home prior to admission.  Patient has been doing well today, he is out of bed to the chair and down to 15 L nasal cannula.   Discussed with CCM and nursing possibility of utilization of an Oxyimizer.  This will have to be coordinated with CCM as they adjust oxygen needs and home delivery company/[employer].  [employer] is the oxygen homecare company that they have utilized in the past.  Discussed getting Oxymizer here to the hospital to begin transition of oxygen needs before dc home.  Detailed hospice benefit as it relates to home with hospice. CCM tells me they have talked with Debra/CMRN regarding need for hospice referral.  Discussed with family the importance of continued conversation with each other and their  medical providers regarding overall plan of care and treatment options,  ensuring decisions are within the context of the patients values and GOCs.  Questions and concerns addressed   Discussed with bedsdie RN/CCM Katie NP  Total time spent on the unit was 45 minutes    PMT will continue to support holistically  Greater than 50% of the time was spent in counseling and coordination of care  I let patient and his wife know I will be out of the hospital until next week but PMT will be available for any needs.   Lorinda Creed NP  Palliative Medicine Team Team Phone # [phone] Pager  [phone]

## 2018-01-08 NOTE — Progress Notes (Signed)
Patient tolerated sitting in chair and ambulating to bathroom for BM today while using 15L HFNC.

## 2018-01-08 NOTE — Progress Notes (Signed)
.. ..  Name: Jeffrey Cobb MRN: [id number] DOB: 1941/06/13    ADMISSION DATE:  01/03/2018 CONSULTATION DATE:  01/03/2018  REFERRING MD :  [facility]- MAYO MD    BRIEF  77 year old male with a past medical history of idiopathic pulmonary fibrosis, hypertension, GERD, dyslipidemia, TIA, pulmonary nodule, RLS, OSA on CPAP, resents to [name] from [facility] after being evaluated for severe dyspnea.  His usual baseline is oxygen requirements of 8 L nasal cannula.  And he was recently evaluated for bronchitis and started on doxycycline and prednisone as an outpatient.  He states over the last several days he has been having worsening shortness of breath and today he turned blue and was unable to walk. On arrival to [facility] he was febrile, cyanotic, sats 70% on nonrebreather using accessory muscles and only able to do one-word sentences.  He was placed on noninvasive positive pressure ventilation, lactate 3.28, patient received nebs, steroids, magnesium, antibiotics.  PCCM was called for admission of acute hypoxic respiratory failure secondary to idiopathic pulmonary fibrosis exacerbation.  On my evaluation of the patient he is alert, speaking in full sentences, continues on noninvasive positive pressure ventilation in no visible distress saturations 93 to 95%. His wife is at bedside. He reiterates the story that he gave to [facility] on presentation.  He follows with Dr. [name] as an outpatient.  Both him and his wife stated that the patient has been in the process of clarifying his goals of care and end-of-life wishes.  He has met with palliative care/hospice.  I asked the patient in the event that our current interventions were not successful what would he want, he stated that he does not want to be intubated and placed on mechanical ventilation,  in the event that his heart should stop he does not want CPR/ACLS, he is okay with noninvasive  positive pressure ventilation or heated high flow nasal cannula.  SIGNIFICANT EVENTS  SOB- NIPPV improved  STUDIES:  CXR: pulm opacity increasing noted in left mid lung CT CHEST W/O CON: confirms GGO no focal consolidation and 39m nodule seen in upper lobe  ABG: 7.36/50/226/30 ( post intervention, no pre intervention gas) LA 3.28--. 0.71  SUBJECTIVE:  Feeling much better.  Down to 15L HFNC.   VITAL SIGNS: Temp:  [96.3 F (35.7 C)-97.8 F (36.6 C)] 97.6 F (36.4 C) (05/16 0750) Pulse Rate:  [65-99] 90 (05/16 0938) Resp:  [14-27] 26 (05/16 0938) BP: (129-154)/(88-96) 154/96 (05/16 0750) SpO2:  [70 %-97 %] 97 % (05/16 0938) FiO2 (%):  [80 %] 80 % (05/16 0500) Weight:  [92.5 kg (203 lb 14.8 oz)-95.1 kg (209 lb 10.5 oz)] 95.1 kg (209 lb 10.5 oz) (05/16 0500)  PHYSICAL EXAMINATION: General: very pleasant chronically ill appearing male, NAD OOB in chair  Neuro: alert and interactive, moving all ext to command HEENT: [place]/AT, PERRL, EOM-I and MMM Hrt: RRR, Nl S1/S2 and -M/R/G Lung: resps even non labored on HFNC, Diffuse crackles Abdomen: Soft, NT, ND and +BS Ext: -edema and -tenderness Skin: intact  PULMONARY Recent Labs  Lab 01/03/18 1725  PHART 7.365  PCO2ART 50.0*  PO2ART 226.0*  HCO3 28.1*  TCO2 30  O2SAT 100.0   CBC Recent Labs  Lab 01/05/18 1309 01/06/18 0242 01/07/18 0217  HGB 13.4 12.3* 13.1  HCT 38.4* 36.6* 38.8*  WBC 14.1* 14.2* 10.7*  PLT 150 144* 147*   COAGULATION Recent Labs  Lab 01/03/18 1633  INR  1.10   CARDIAC   Recent Labs  Lab 01/03/18 2300 01/04/18 0324 01/04/18 0927  TROPONINI 0.08* 0.08* 0.06*   No results for input(s): PROBNP in the last 168 hours.  CHEMISTRY Recent Labs  Lab 01/03/18 1633 01/04/18 0324 01/05/18 1309 01/06/18 0242 01/07/18 0217  NA 131* 135 135 134* 134*  K 3.5 3.5 2.7* 3.8 3.6  CL 94* 97* 93* 94* 94*  CO2 _0 GLUCOSE 148* 145* 200* 184* 143*  BUN 15 12 30* 33* 32*  CREATININE 1.10  0.97 1.27* 1.15 1.42*  CALCIUM 8.3* 8.4* 8.6* 8.3* 8.3*  MG  --  2.3 2.0 2.0 2.0  PHOS  --  3.7 3.7 2.9 3.6   Estimated Creatinine Clearance: 47.9 mL/min (A) (by C-G formula based on SCr of 1.42 mg/dL (H)).  LIVER Recent Labs  Lab 01/03/18 1633  AST 59*  ALT 40  ALKPHOS 81  BILITOT 2.3*  PROT 7.1  ALBUMIN 3.3*  INR 1.10   INFECTIOUS Recent Labs  Lab 01/03/18 1635 01/03/18 1824 01/03/18 2300 01/04/18 0324 01/05/18 0432  LATICACIDVEN 3.28* 0.71  --   --   --   PROCALCITON  --   --  0.59 1.06 0.68   ENDOCRINE CBG (last 3)  Recent Labs    01/07/18 1602 01/07/18 2123 01/08/18 0749  GLUCAP 137* 179* 126*   No results found.   ASSESSMENT / PLAN: Principal Problem:   Acute on chronic respiratory failure with hypoxemia (HCC) Active Problems:   RLS (restless legs syndrome)   Idiopathic pulmonary fibrosis (HCC)   DNR (do not resuscitate) discussion   Respiratory failure with hypoxia and hypercapnia ([place])   Community acquired pneumonia   Palliative care by specialist   Acute on chronic respiratory failure - slightly improved.   ILD  Pulmonary edema - slightly improved.  NEG 5L  PLAN -  Continue bipap qhs and PRN  Wean steroids to 17m IV q8h  Continue Ofev  Hold further lasix for now Intermittent f/u CXR  Wean O2 as able  Mobilize  LCB, DNI  See discussion below    Hypokalemia: PLAN -  Replete K PRN  F/u chem    Discussed at length again today with pt, wife at bedside.  He feels better but knows that his time is limited and that he will die from his lung disease.  He is accepting of this and continues to say that his biggest goal is to go home.  He wants to be able to live his remaining days at home, painting, feeling comfortable in his own home.  He thinks it will be easier for friends and family to visit him at home.  He wants to go home even if it means not being able to provide as much oxygen as he may need at some point and potentially hastening his  decline and would focus solely on comfort when that time comes, with the help of hospice support.   I will d/w Palliative care and case management and see what we may need to get into place to potentially discuss sending him home.  He is down to 15L HFNC today and has a concentrator at home that goes to 10L.  In the meantime will continue IV steroids (weaned some today, may be able to change to PO soon).  Also discussed PRN morphine with pt in the meantime if he were to feel SOB.    KNickolas Madrid NP 01/08/2018  11:10 AM Pager: [phone]  [phone] or [phone]

## 2018-01-09 LAB — GLUCOSE, CAPILLARY
GLUCOSE-CAPILLARY: 168 mg/dL — AB (ref 65–99)
GLUCOSE-CAPILLARY: 200 mg/dL — AB (ref 65–99)
Glucose-Capillary: 129 mg/dL — ABNORMAL HIGH (ref 65–99)
Glucose-Capillary: 134 mg/dL — ABNORMAL HIGH (ref 65–99)
Glucose-Capillary: 125 mg/dL — ABNORMAL HIGH (ref 65–99)

## 2018-01-09 MED ORDER — POTASSIUM CHLORIDE CRYS ER 20 MEQ PO TBCR
40.0000 meq | EXTENDED_RELEASE_TABLET | Freq: Three times a day (TID) | ORAL | Status: AC
  Administered 2018-01-09 (×2): 40 meq via ORAL
  Filled 2018-01-09 (×2): qty 2

## 2018-01-09 MED ORDER — METHYLPREDNISOLONE SODIUM SUCC 40 MG IJ SOLR
40.0000 mg | Freq: Four times a day (QID) | INTRAMUSCULAR | Status: DC
  Administered 2018-01-09 – 2018-01-11 (×7): 40 mg via INTRAVENOUS
  Filled 2018-01-09 (×7): qty 1

## 2018-01-09 NOTE — Progress Notes (Signed)
Pharmacy Antibiotic Note  Jeffrey Cobb is a 77 y.o. male admitted on 01/03/2018 with pneumonia.  Pharmacy has been consulted for Cefepime dosing - day #8. Pt has hx of pulmonary fibrosis, here with shortness of breath, CXR with possible PNA. Afebrile, WBC 10.7, CrCl~45-50.  Plan: Adjust cefepime to 2gm q24h F/u renal fxn, C&S, clinical status, LOT - will likely d/c today per Rx discussion with CCM  Height:  (170.2 cm) Weight: 209 lb 10.5 oz (95.1 kg) IBW/kg (Calculated) : 66.1  Temp (24hrs), Avg:97.6 F (36.4 C), Min:97.4 F (36.3 C), Max:97.7 F (36.5 C)  Recent Labs  Lab 01/03/18 1633 01/03/18 1635 01/03/18 1824 01/04/18 0324 01/05/18 1309 01/06/18 0242 01/07/18 0217  WBC 10.5  --   --  8.7 14.1* 14.2* 10.7*  CREATININE 1.10  --   --  0.97 1.27* 1.15 1.42*  LATICACIDVEN  --  3.28* 0.71  --   --   --   --     Estimated Creatinine Clearance: 47.9 mL/min (A) (by C-G formula based on SCr of 1.42 mg/dL (H)).    Allergies  Allergen Reactions  . Gabapentin     Dizziness   . Sulfonamide Derivatives Rash  . Trazodone And Nefazodone Rash and Other (See Comments)    Hallucinations    Babs Bertin, PharmD, BCPS Clinical Pharmacist 01/09/2018 1:20 PM

## 2018-01-09 NOTE — Progress Notes (Signed)
.. ..  Name: Jeffrey Cobb MRN: [id number] DOB: Jan 19, 1941    ADMISSION DATE:  01/03/2018 CONSULTATION DATE:  01/03/2018  REFERRING MD :  [facility] MD  BRIEF  77 year old male with a past medical history of idiopathic pulmonary fibrosis, hypertension, GERD, dyslipidemia, TIA, pulmonary nodule, RLS, OSA on CPAP, [name] ICU from [facility] after being evaluated for severe dyspnea.  His usual baseline is oxygen requirements of 8 L nasal cannula.  And he was recently evaluated for bronchitis and started on doxycycline and prednisone as an outpatient.  He states over the last several days he has been having worsening shortness of breath and today he turned blue and was unable to walk. On arrival to [facility] he was febrile, cyanotic, sats 70% on nonrebreather using accessory muscles and only able to do one-word sentences.  He was placed on noninvasive positive pressure ventilation, lactate 3.28, patient received nebs, steroids, magnesium, antibiotics.  PCCM was called for admission of acute hypoxic respiratory failure secondary to idiopathic pulmonary fibrosis exacerbation.  On my evaluation of the patient he is alert, speaking in full sentences, continues on noninvasive positive pressure ventilation in no visible distress saturations 93 to 95%. His wife is at bedside. He reiterates the story that he gave to [facility] on presentation.  He follows with Dr. [name] as an outpatient.  Both him and his wife stated that the patient has been in the process of clarifying his goals of care and end-of-life wishes.  He has met with palliative care/hospice.  I asked the patient in the event that our current interventions were not successful what would he want, he stated that he does not want to be intubated and placed on mechanical ventilation,  in the event that his heart should stop he does not want CPR/ACLS, he is okay with noninvasive  positive pressure ventilation or heated high flow nasal cannula.  SIGNIFICANT EVENTS  SOB- NIPPV improved  STUDIES:  CXR: pulm opacity increasing noted in left mid lung CT CHEST W/O CON: confirms GGO no focal consolidation and 2m nodule seen in upper lobe  ABG: 7.36/50/226/30 ( post intervention, no pre intervention gas) LA 3.28--0.71  SUBJECTIVE:  Down to 15L HFNC  VITAL SIGNS: Temp:  [97.4 F (36.3 C)-97.7 F (36.5 C)] 97.4 F (36.3 C) (05/17 0807) Pulse Rate:  [65-92] 69 (05/17 0810) Resp:  [15-22] 21 (05/17 0810) BP: (135-148)/(84-103) 145/84 (05/17 0807) SpO2:  [89 %-98 %] 91 % (05/17 0810)  PHYSICAL EXAMINATION: General: Chronically ill appearing male, NAD in the chair Neuro: Alert and interactive, moving all ext to command HEENT: /AT, PERRL, EOM-I and MMM Hrt: RRR, Nl S1/S2 and -M/R/G Lung: Diffuse crackles Abdomen: Soft, NT, ND and +BS Ext: -edema and -tenderness Skin: Intact  PULMONARY Recent Labs  Lab 01/03/18 1725  PHART 7.365  PCO2ART 50.0*  PO2ART 226.0*  HCO3 28.1*  TCO2 30  O2SAT 100.0   CBC Recent Labs  Lab 01/05/18 1309 01/06/18 0242 01/07/18 0217  HGB 13.4 12.3* 13.1  HCT 38.4* 36.6* 38.8*  WBC 14.1* 14.2* 10.7*  PLT 150 144* 147*   COAGULATION Recent Labs  Lab 01/03/18 1633  INR 1.10   CARDIAC   Recent Labs  Lab 01/03/18 2300 01/04/18 0324 01/04/18 0927  TROPONINI 0.08* 0.08* 0.06*   No results for input(s): PROBNP in the last 168 hours.  CHEMISTRY Recent Labs  Lab 01/03/18 1633 01/04/18 0324 01/05/18 1309 01/06/18 0242  01/07/18 0217  NA 131* 135 135 134* 134*  K 3.5 3.5 2.7* 3.8 3.6  CL 94* 97* 93* 94* 94*  CO2 _0 GLUCOSE 148* 145* 200* 184* 143*  BUN 15 12 30* 33* 32*  CREATININE 1.10 0.97 1.27* 1.15 1.42*  CALCIUM 8.3* 8.4* 8.6* 8.3* 8.3*  MG  --  2.3 2.0 2.0 2.0  PHOS  --  3.7 3.7 2.9 3.6   Estimated Creatinine Clearance: 47.9 mL/min (A) (by C-G formula based on SCr of 1.42 mg/dL  (H)).  LIVER Recent Labs  Lab 01/03/18 1633  AST 59*  ALT 40  ALKPHOS 81  BILITOT 2.3*  PROT 7.1  ALBUMIN 3.3*  INR 1.10   INFECTIOUS Recent Labs  Lab 01/03/18 1635 01/03/18 1824 01/03/18 2300 01/04/18 0324 01/05/18 0432  LATICACIDVEN 3.28* 0.71  --   --   --   PROCALCITON  --   --  0.59 1.06 0.68   ENDOCRINE CBG (last 3)  Recent Labs    01/08/18 2103 01/09/18 0716 01/09/18 1137  GLUCAP 184* 129* 200*   IMAGING x48h  - image(s) personally visualized  -   highlighted in bold  I reviewed CXR myself, fibrotic changes noted.   ASSESSMENT / PLAN: Principal Problem:   Acute on chronic respiratory failure with hypoxemia (HCC) Active Problems:   RLS (restless legs syndrome)   Dyspnea   Idiopathic pulmonary fibrosis (HCC)   DNR (do not resuscitate) discussion   Respiratory failure with hypoxia and hypercapnia ([place])   Community acquired pneumonia   Palliative care by specialist   Palliative care encounter  Discussed with PCCM-NP  Acute on chronic respiratory failure:  - Continue BiPAP to QHS  - Order and ABG to demonstrate hypercarbia to qualify for BiPAP  ILD:   - Decrease Solumedrol 40 mg IV q6  - Place back on Ofev, family brought it from home  GOC:  - LCB with BiPAP only and no further escalation of care  - Had extensive discussion with patient and family and confirmed code status to LCB, BiPAP only.  Pulmonary edema: improving  - Hold further lasix at this point  - KVO IVF  - Strict I/O  Hypokalemia:  - Give 2 additional doses of K-dur  - BMET in AM  Ongoing discussion with palliative and CSW in order to facilitate BiPAP for home and oxymizer at home.    Rush Farmer, M.D. [facility] Pulmonary/Critical Care Medicine. Pager: [phone]. After hours pager: [phone].  01/09/2018 2:28 PM

## 2018-01-09 NOTE — Progress Notes (Signed)
Palliative Medicine RN Note: Rec'd a call from [facility] that PCCM wants to send Mr Dommer home on a Trilogy & that hospice won't pay for it. She asked about using an Oxymizer.  We discussed the conversations had yesterday between PCCM & PMT, including that PMT was unaware the patient has NOT been moved to an Oxymizer as discussed yesterday. Hospice should be able to accommodate O2 rates 10L or less, and bipap for comfort/symptom control should be covered as well.  I went to see Kellie Moor. His goal is to go home and stay home with hospice care. He is currently on 8L O2 via humidified [place] with bipap prn comfort. He has been using CPAP at home for years.  Patient's first choice for hospice is [facility], who will not take a patient on a bipap for comfort only.   I then called [facility], who is absolutely willing to care for [place]. [place]. They requested a hard rx with bipap settings, which Dr Molli Knock completed; it is on his shadow chart. They will require some time to get RT to the house to set it up, likely tomorrow. I updated RNCM Gavin Pound and Dr Molli Knock, as well as Kellie Moor and his family of the plan.  Margret Chance Leshawn Straka, RN, BSN, [facility] Palliative Medicine Team 01/09/2018 4:23 PM Office [phone]

## 2018-01-10 LAB — GLUCOSE, CAPILLARY
GLUCOSE-CAPILLARY: 165 mg/dL — AB (ref 65–99)
GLUCOSE-CAPILLARY: 148 mg/dL — AB (ref 65–99)
Glucose-Capillary: 120 mg/dL — ABNORMAL HIGH (ref 65–99)
Glucose-Capillary: 175 mg/dL — ABNORMAL HIGH (ref 65–99)

## 2018-01-10 LAB — BASIC METABOLIC PANEL
Anion gap: 8 (ref 5–15)
BUN: 26 mg/dL — AB (ref 6–20)
CHLORIDE: 96 mmol/L — AB (ref 101–111)
CO2: 30 mmol/L (ref 22–32)
Calcium: 8.4 mg/dL — ABNORMAL LOW (ref 8.9–10.3)
Creatinine, Ser: 1.03 mg/dL (ref 0.61–1.24)
GFR calc Af Amer: >60 mL/min (ref >60)
GFR calc non Af Amer: >60 mL/min (ref >60)
GLUCOSE: 134 mg/dL — AB (ref 65–99)
POTASSIUM: 5.1 mmol/L (ref 3.5–5.1)
Sodium: 134 mmol/L — ABNORMAL LOW (ref 135–145)

## 2018-01-10 LAB — CBC
HCT: 39.2 % (ref 39.0–52.0)
HEMOGLOBIN: 13.3 g/dL (ref 13.0–17.0)
MCH: 30.6 pg (ref 26.0–34.0)
MCHC: 33.9 g/dL (ref 30.0–36.0)
MCV: 90.3 fL (ref 78.0–100.0)
Platelets: 154 10*3/uL (ref 150–400)
RBC: 4.34 MIL/uL (ref 4.22–5.81)
RDW: 14.7 % (ref 11.5–15.5)
WBC: 9.4 10*3/uL (ref 4.0–10.5)

## 2018-01-10 LAB — PHOSPHORUS: Phosphorus: 3.4 mg/dL (ref 2.5–4.6)

## 2018-01-10 LAB — MAGNESIUM: Magnesium: 2.4 mg/dL (ref 1.7–2.4)

## 2018-01-10 NOTE — Discharge Summary (Addendum)
Physician Discharge Summary  Patient ID: Jeffrey Cobb MRN: [id number] DOB/AGE: 11/10/1940 77 y.o.  Admit date: 01/03/2018 Discharge date: 01/11/2018    Discharge Diagnoses:  Principal Problem:   Acute on chronic respiratory failure with hypoxemia (HCC) Active Problems:   RLS (restless legs syndrome)   Dyspnea   Idiopathic pulmonary fibrosis (HCC)   DNR (do not resuscitate) discussion   Respiratory failure with hypoxia and hypercapnia ([place])   Community acquired pneumonia   Palliative care by specialist   Palliative care encounter                                                       D/c plan by Discharge Diagnosis  Acute on chronic respiratory failure  End stage ILD  Pulmonary edema - improved  PLAN -  Continue O2 - 10L with oxymizer at home  Home bipap qhs  Home with hospice support  DNR  Continue steroids with slow taper  Continue OFEv  Resume home lasix  PRN morphine for dyspnea  PRN xanax  Pulmonary available as outpt PRN      Brief Summary: Jeffrey Cobb is a 77 year old male with a past medical history of idiopathic pulmonary fibrosis, hypertension, GERD, dyslipidemia, TIA, pulmonary nodule, RLS, OSA on CPAP, [name] from [facility] after being evaluated for severe dyspnea.  His usual baseline is oxygen requirements of 8 L nasal cannula.  And he was recently evaluated for bronchitis and started on doxycycline and prednisone as an outpatient.  On arrival to [facility] he was febrile, cyanotic, sats 70% on nonrebreather using accessory muscles and only able to do one-word sentences.  He was placed on noninvasive positive pressure ventilation, lactate 3.28, patient received nebs, steroids, magnesium, antibiotics.  PCCM was called for admission of acute hypoxic respiratory failure secondary to idiopathic pulmonary fibrosis exacerbation.   He was treated for acute flare of end-stage ILD, possible HCAP, pulmonary edema.   He improved slowly with abx, diuresis, IV steroids.  Throughout admission his goals of care were addressed and he was initially made LCB/DNI.  Over the course of his admission we continued to discuss overall goals of care, and as he improved he was able to make clear that he wants to go home to live out the rest of his days with his family, be able to paint and have visitors.  Decision was made for full DNR with ongoing reasonable medical care including steroids, abx, O2.  He has improved slowly with decreased O2 needs and has been seen by palliative care.  Plans are for discharge home with hospice and continued qhs bipap as long as it is helping him symptomatically.    Consults: Palliative care   Lines/tubes: none  Microbiology/Sepsis markers: BC x 2 5/11>>>NEG  Sputum 5/11>>> normal flora  RVP 5/11>>> NEG   ABX:  Rocephin, azithro, vanc 5/11>>>5/16 5/16 cefepime>>>5/18  STUDIES:  CXR: pulm opacity increasing noted in left mid lung CT CHEST W/O CON: confirms GGO no focal consolidation and 58m nodule seen in upper lobe  ABG: 7.36/50/226/30 ( post intervention, no pre intervention gas) LA 3.28--0.71    Vitals:   01/11/18 0127 01/11/18 0254 01/11/18 0400 01/11/18 0830  BP:   (!) 160/96 (!) 162/94  Pulse: 84  78 92  Resp: (!) 21  (!)  22 20  Temp:   97.8 F (36.6 C) 98.6 F (37 C)  TempSrc:   Oral Oral  SpO2: 95%  94% 90%  Weight:  96.8 kg (213 lb 6.5 oz)    Height:         Discharge Labs  BMET Recent Labs  Lab 01/05/18 1309 01/06/18 0242 01/07/18 0217 01/10/18 0350  NA 135 134* 134* 134*  K 2.7* 3.8 3.6 5.1  CL 93* 94* 94* 96*  CO2 _0 GLUCOSE 200* 184* 143* 134*  BUN 30* 33* 32* 26*  CREATININE 1.27* 1.15 1.42* 1.03  CALCIUM 8.6* 8.3* 8.3* 8.4*  MG 2.0 2.0 2.0 2.4  PHOS 3.7 2.9 3.6 3.4     CBC  Recent Labs  Lab 01/06/18 0242 01/07/18 0217 01/10/18 0350  HGB 12.3* 13.1 13.3  HCT 36.6* 38.8* 39.2  WBC 14.2* 10.7* 9.4  PLT 144* 147* 154    Anti-Coagulation No results for input(s): INR in the last 168 hours.    Follow-up Information    [facility].   Why:  home hospice Contact information: [address] [place] [address] [phone]            Allergies as of 01/11/2018      Reactions   Gabapentin    Dizziness   Sulfonamide Derivatives Rash   Trazodone And Nefazodone Rash, Other (See Comments)   Hallucinations       Medication List    STOP taking these medications   amLODipine 5 MG tablet Commonly known as:  NORVASC   aspirin 81 MG tablet   atorvastatin 40 MG tablet Commonly known as:  LIPITOR   sodium chloride HYPERTONIC 3 % nebulizer solution   triamcinolone cream 0.1 % Commonly known as:  KENALOG   vitamin B-12 500 MCG tablet Commonly known as:  CYANOCOBALAMIN     TAKE these medications   ALPRAZolam 0.25 MG tablet Commonly known as:  XANAX Take 1 tablet (0.25 mg total) by mouth 2 (two) times daily as needed for anxiety.   chlorpheniramine-HYDROcodone 10-8 MG/5ML Suer Commonly known as:  TUSSIONEX PENNKINETIC ER Take 5 mLs by mouth every 12 (twelve) hours as needed for cough.   escitalopram 20 MG tablet Commonly known as:  LEXAPRO Take 1 tablet (20 mg total) by mouth daily.   furosemide 40 MG tablet Commonly known as:  LASIX TAKE 1 TABLET BY MOUTH EVERY DAY   guaiFENesin 600 MG 12 hr tablet Commonly known as:  MUCINEX Take 1 tablet (600 mg total) by mouth 2 (two) times daily.   morphine CONCENTRATE 10 MG/0.5ML Soln concentrated solution Take 0.25-0.5 mLs (5-10 mg total) by mouth every 4 (four) hours as needed for shortness of breath.   OFEV 150 MG Caps Generic drug:  Nintedanib Take 150 mg by mouth 2 (two) times daily.   oxymetazoline 0.05 % nasal spray Commonly known as:  AFRIN Place 1 spray into both nostrils as needed for congestion.   pramipexole 0.5 MG tablet Commonly known as:  MIRAPEX TAKE 1 TABLET BY MOUTH EVERY MORNING AND  TAKE 1 TABLET EVERY EVENING   predniSONE 10 MG tablet Commonly known as:  DELTASONE 4 tabs daily x 5 days then 3 tabs daily  x 5 days then 2 tabs daily x 5 days then 1 tab daily What changed:    medication strength  additional instructions   PROAIR HFA 108 (90 Base) MCG/ACT inhaler Generic drug:  albuterol Inhale 2  puffs into the lungs every 6 (six) hours as needed.   albuterol (2.5 MG/3ML) 0.083% nebulizer solution Commonly known as:  PROVENTIL Take 3 mLs (2.5 mg total) by nebulization every 6 (six) hours as needed for wheezing or shortness of breath.   QUEtiapine 100 MG tablet Commonly known as:  SEROQUEL Take 100 mg by mouth at bedtime.         Disposition: Discharge disposition: 01-Home or Self Care     home with hospice   Discharged Condition: AVEREY TROMPETER has met maximum benefit of inpatient care and is medically stable and cleared for discharge.  Patient is pending follow up as above.      Time spent on disposition:  Greater than 35 minutes.   Signed: [name], NP 01/11/2018  10:59 AM Pager: [phone] or [phone]  Attending Note:  77 year old male with end stage ILD presenting with an exacerbation.  Patient has all equipment needed at home.  Will change steroids to PO. And discharge home with hospice.   Patient seen and examined, agree with above note.  I dictated the care and orders written for this patient under my direction.  Rush Farmer, [place]

## 2018-01-10 NOTE — Care Management (Signed)
Notified by Leandro Reasoner, RN with [facility] that pt's BIPAP will be delivered tomorrow.  Pt can be discharged tomorrow.  Pt and wife have contact info for [facility] and are agreeable to go home tomorrow.

## 2018-01-10 NOTE — Progress Notes (Addendum)
.. ..  Name: Jeffrey Cobb MRN: [id number] DOB: 12/15/1940    ADMISSION DATE:  01/03/2018 CONSULTATION DATE:  01/03/2018  REFERRING MD :  [facility] MAYO MD  BRIEF  77 year old male with a past medical history of idiopathic pulmonary fibrosis, hypertension, GERD, dyslipidemia, TIA, pulmonary nodule, RLS, OSA on CPAP, resents to [name] ICU from [facility] after being evaluated for severe dyspnea.  His usual baseline is oxygen requirements of 8 L nasal cannula.  And he was recently evaluated for bronchitis and started on doxycycline and prednisone as an outpatient.  He states over the last several days he has been having worsening shortness of breath and today he turned blue and was unable to walk. On arrival to [facility] he was febrile, cyanotic, sats 70% on nonrebreather using accessory muscles and only able to do one-word sentences.  He was placed on noninvasive positive pressure ventilation, lactate 3.28, patient received nebs, steroids, magnesium, antibiotics.  PCCM was called for admission of acute hypoxic respiratory failure secondary to idiopathic pulmonary fibrosis exacerbation.  On my evaluation of the patient he is alert, speaking in full sentences, continues on noninvasive positive pressure ventilation in no visible distress saturations 93 to 95%. His wife is at bedside. He reiterates the story that he gave to [facility] on presentation.  He follows with Dr. [name] as an outpatient.  Both him and his wife stated that the patient has been in the process of clarifying his goals of care and end-of-life wishes.  He has met with palliative care/hospice.  I asked the patient in the event that our current interventions were not successful what would he want, he stated that he does not want to be intubated and placed on mechanical ventilation,  in the event that his heart should stop he does not want CPR/ACLS, he is okay with noninvasive  positive pressure ventilation or heated high flow nasal cannula.  SIGNIFICANT EVENTS  SOB- NIPPV improved  STUDIES:  CXR: pulm opacity increasing noted in left mid lung CT CHEST W/O CON: confirms GGO no focal consolidation and 62m nodule seen in upper lobe  ABG: 7.36/50/226/30 ( post intervention, no pre intervention gas) LA 3.28--0.71  SUBJECTIVE:  Down to 12L HFNC Hospice unable to deliver all home supplies today - will plan for home in am.  More sleepy today. But no c/o overall.   VITAL SIGNS: Temp:  [97.5 F (36.4 C)-98.1 F (36.7 C)] 97.6 F (36.4 C) (05/18 0900) Pulse Rate:  [57-90] 90 (05/18 0900) Resp:  [12-19] 18 (05/18 0900) BP: (132-177)/(77-108) 177/108 (05/18 0900) SpO2:  [90 %-96 %] 95 % (05/18 0900) Weight:  [94.6 kg (208 lb 8.9 oz)] 94.6 kg (208 lb 8.9 oz) (05/18 0400)  PHYSICAL EXAMINATION: General:  Pleasant, chronically ill appearing male, NAD  HEENT: MM pink/moist Neuro: sleepy but easily arousable, appropriate, MAE  CV: s1s2 rrr, no m/r/g PULM: even/non-labored, scattered crackles, faint exp wheeze  GFG:BMSX non-tender, bsx4 active  Extremities: warm/dry, no sig edema  Skin: no rashes or lesions   PULMONARY Recent Labs  Lab 01/03/18 1725  PHART 7.365  PCO2ART 50.0*  PO2ART 226.0*  HCO3 28.1*  TCO2 30  O2SAT 100.0   CBC Recent Labs  Lab 01/06/18 0242 01/07/18 0217 01/10/18 0350  HGB 12.3* 13.1 13.3  HCT 36.6* 38.8* 39.2  WBC 14.2* 10.7* 9.4  PLT 144* 147* 154   COAGULATION Recent Labs  Lab 01/03/18 1633  INR 1.10  CARDIAC   Recent Labs  Lab 01/03/18 2300 01/04/18 0324 01/04/18 0927  TROPONINI 0.08* 0.08* 0.06*   No results for input(s): PROBNP in the last 168 hours.  CHEMISTRY Recent Labs  Lab 01/04/18 0324 01/05/18 1309 01/06/18 0242 01/07/18 0217 01/10/18 0350  NA 135 135 134* 134* 134*  K 3.5 2.7* 3.8 3.6 5.1  CL 97* 93* 94* 94* 96*  CO2 26 31 30 30 30   GLUCOSE 145* 200* 184* 143* 134*  BUN 12 30* 33*  32* 26*  CREATININE 0.97 1.27* 1.15 1.42* 1.03  CALCIUM 8.4* 8.6* 8.3* 8.3* 8.4*  MG 2.3 2.0 2.0 2.0 2.4  PHOS 3.7 3.7 2.9 3.6 3.4   Estimated Creatinine Clearance: 65.8 mL/min (by C-G formula based on SCr of 1.03 mg/dL).  LIVER Recent Labs  Lab 01/03/18 1633  AST 59*  ALT 40  ALKPHOS 81  BILITOT 2.3*  PROT 7.1  ALBUMIN 3.3*  INR 1.10   INFECTIOUS Recent Labs  Lab 01/03/18 1635 01/03/18 1824 01/03/18 2300 01/04/18 0324 01/05/18 0432  LATICACIDVEN 3.28* 0.71  --   --   --   PROCALCITON  --   --  0.59 1.06 0.68   ENDOCRINE CBG (last 3)  Recent Labs    01/09/18 1741 01/09/18 2055 01/10/18 0731  GLUCAP 134* 125* 120*   IMAGING x48h  - image(s) personally visualized  -   highlighted in bold    ASSESSMENT / PLAN: Principal Problem:   Acute on chronic respiratory failure with hypoxemia (HCC) Active Problems:   RLS (restless legs syndrome)   Dyspnea   Idiopathic pulmonary fibrosis (HCC)   DNR (do not resuscitate) discussion   Respiratory failure with hypoxia and hypercapnia ([place])   Community acquired pneumonia   Palliative care by specialist   Palliative care encounter   Acute on chronic respiratory failure  End stage ILD  Pulmonary edema - improved  PLAN -  Continue O2 - wean as able  May need oxymizer at home  Plan for home with hospice in am 5/19 Continue qhs CPAP - hospice to arrange this for home as well  Continue steroids - transition to PO in am  Continue OFev  D8 abx - will d/c after today's dose no need to continue on d/c  Holding further lasix  PRN morphine  PRN xanax - would try morphine first  DNR      Jeffrey Madrid, NP 01/10/2018  12:05 PM Pager: [phone] or [phone]  Attending Note:  77 year old male with PMH of ES-ILD at this point who continues to require BiPAP at night and HFNC during the day.  On exam, crackles noted but improving.  I reviewed CXR myself, fibrotic changes noted.  Discussed with PCCM-NP.  Plan is  to discharge patient in AM once BiPAP and oxymizer are available.  Acute on chronic respiratory failure:             - BiPAP to QHS  - Arrange for home BiPAP  ILD:              - Decrease Solumedrol 40 mg IV q6, will switch to PO in AM prior to discharge  - D/C abx             - Place back on Ofev, family brought it from home  GOC:             - LCB with BiPAP only and no further escalation of care             -  Had extensive discussion with patient and family and confirmed code status to LCB, BiPAP only.  Pulmonary edema: improving             - Hold further lasix at this point             - KVO IVF             - Strict I/O  Patient seen and examined, agree with above note.  I dictated the care and orders written for this patient under my direction.  Rush Farmer, M.D. [facility] Pulmonary/Critical Care Medicine. Pager: [phone]. After hours pager: [phone].

## 2018-01-11 LAB — GLUCOSE, CAPILLARY
GLUCOSE-CAPILLARY: 112 mg/dL — AB (ref 65–99)
Glucose-Capillary: 125 mg/dL — ABNORMAL HIGH (ref 65–99)

## 2018-01-11 MED ORDER — PREDNISONE 10 MG PO TABS: ORAL_TABLET | ORAL | 0 refills | Status: AC

## 2018-01-11 MED ORDER — MORPHINE SULFATE (CONCENTRATE) 10 MG/0.5ML PO SOLN: 5.0000 mg | ORAL | 0 refills | Status: AC | PRN

## 2018-01-11 MED ORDER — ALPRAZOLAM 0.25 MG PO TABS: 0.2500 mg | ORAL_TABLET | Freq: Two times a day (BID) | ORAL | 0 refills | Status: AC | PRN

## 2018-01-11 NOTE — Progress Notes (Signed)
[facility]: Met with Pt and family at bedside 01/10/18. Explained services and discussed goals of care. Pt requesting to go home with hospice services. DNR. Equipment ordered, will plan hospice admission once discharged home on 01/11/18. Family will notify RN once discharged and RT will set up time with family to adjust Bipap in home. Thank you for the opportunity to serve this pt. Doroteo Glassman, RN [facility]

## 2018-01-24 DEATH — deceased

## 2018-01-26 ENCOUNTER — Telehealth: Payer: Self-pay | Admitting: Neurology

## 2018-01-26 NOTE — Telephone Encounter (Signed)
Patient's wife called and advised patient passed away on 01-21-18.

## 2018-01-26 NOTE — Telephone Encounter (Signed)
Is an order on file ? If yes, close phone note.

## 2018-03-26 ENCOUNTER — Ambulatory Visit (HOSPITAL_COMMUNITY): Payer: Self-pay | Admitting: Psychiatry

## 2018-04-01 ENCOUNTER — Ambulatory Visit: Payer: Medicare Other | Admitting: Adult Health

## 2018-04-01 ENCOUNTER — Encounter

## 2018-08-12 ENCOUNTER — Ambulatory Visit: Payer: Medicare Other | Admitting: Neurology
